# Patient Record
Sex: Female | Born: 1955
Health system: Southern US, Community
[De-identification: ages and names within clinical notes are randomized; demographics above are authoritative.]

## PROBLEM LIST (undated history)

## (undated) DIAGNOSIS — Z8719 Personal history of other diseases of the digestive system: Secondary | ICD-10-CM

## (undated) DIAGNOSIS — F329 Major depressive disorder, single episode, unspecified: Secondary | ICD-10-CM

## (undated) DIAGNOSIS — G4730 Sleep apnea, unspecified: Secondary | ICD-10-CM

## (undated) DIAGNOSIS — R053 Chronic cough: Secondary | ICD-10-CM

## (undated) DIAGNOSIS — E876 Hypokalemia: Secondary | ICD-10-CM

## (undated) DIAGNOSIS — J9690 Respiratory failure, unspecified, unspecified whether with hypoxia or hypercapnia: Secondary | ICD-10-CM

## (undated) DIAGNOSIS — N189 Chronic kidney disease, unspecified: Secondary | ICD-10-CM

## (undated) DIAGNOSIS — T4145XA Adverse effect of unspecified anesthetic, initial encounter: Secondary | ICD-10-CM

## (undated) DIAGNOSIS — K219 Gastro-esophageal reflux disease without esophagitis: Secondary | ICD-10-CM

## (undated) DIAGNOSIS — F32A Depression, unspecified: Secondary | ICD-10-CM

## (undated) DIAGNOSIS — T8859XA Other complications of anesthesia, initial encounter: Secondary | ICD-10-CM

## (undated) DIAGNOSIS — D649 Anemia, unspecified: Secondary | ICD-10-CM

## (undated) DIAGNOSIS — Z9889 Other specified postprocedural states: Secondary | ICD-10-CM

## (undated) DIAGNOSIS — R7303 Prediabetes: Secondary | ICD-10-CM

## (undated) DIAGNOSIS — R05 Cough: Secondary | ICD-10-CM

## (undated) DIAGNOSIS — J189 Pneumonia, unspecified organism: Secondary | ICD-10-CM

## (undated) DIAGNOSIS — F419 Anxiety disorder, unspecified: Secondary | ICD-10-CM

## (undated) DIAGNOSIS — R739 Hyperglycemia, unspecified: Secondary | ICD-10-CM

## (undated) DIAGNOSIS — I10 Essential (primary) hypertension: Secondary | ICD-10-CM

## (undated) DIAGNOSIS — J45909 Unspecified asthma, uncomplicated: Secondary | ICD-10-CM

## (undated) HISTORY — DX: Major depressive disorder, single episode, unspecified: F32.9

## (undated) HISTORY — DX: Hyperglycemia, unspecified: R73.9

## (undated) HISTORY — PX: TUBAL LIGATION: SHX77

## (undated) HISTORY — DX: Cough: R05

## (undated) HISTORY — DX: Anxiety disorder, unspecified: F41.9

## (undated) HISTORY — DX: Chronic cough: R05.3

## (undated) HISTORY — DX: Other specified postprocedural states: Z98.890

## (undated) HISTORY — DX: Personal history of other diseases of the digestive system: Z87.19

## (undated) HISTORY — DX: Depression, unspecified: F32.A

## (undated) HISTORY — DX: Gastro-esophageal reflux disease without esophagitis: K21.9

## (undated) HISTORY — PX: ERCP: SHX60

## (undated) HISTORY — DX: Sleep apnea, unspecified: G47.30

## (undated) HISTORY — DX: Respiratory failure, unspecified, unspecified whether with hypoxia or hypercapnia: J96.90

## (undated) HISTORY — PX: APPENDECTOMY: SHX54

## (undated) HISTORY — DX: Essential (primary) hypertension: I10

## (undated) HISTORY — DX: Chronic kidney disease, unspecified: N18.9

## (undated) HISTORY — DX: Hypokalemia: E87.6

## (undated) HISTORY — PX: CHOLECYSTECTOMY: SHX55

## (undated) HISTORY — PX: HAMMER TOE SURGERY: SHX385

## (undated) HISTORY — PX: BREAST REDUCTION SURGERY: SHX8

## (undated) HISTORY — PX: ABDOMINAL HYSTERECTOMY: SHX81

---

## 1997-01-06 HISTORY — PX: INTESTINAL MALROTATION REPAIR: SHX411

## 1997-12-29 ENCOUNTER — Inpatient Hospital Stay (HOSPITAL_COMMUNITY): Admission: EM | Admit: 1997-12-29 | Discharge: 1998-01-10 | Payer: Self-pay | Admitting: Emergency Medicine

## 1998-01-03 ENCOUNTER — Encounter (HOSPITAL_BASED_OUTPATIENT_CLINIC_OR_DEPARTMENT_OTHER): Payer: Self-pay | Admitting: General Surgery

## 1998-01-05 ENCOUNTER — Encounter: Payer: Self-pay | Admitting: Internal Medicine

## 1998-01-08 ENCOUNTER — Encounter (HOSPITAL_BASED_OUTPATIENT_CLINIC_OR_DEPARTMENT_OTHER): Payer: Self-pay | Admitting: General Surgery

## 1998-01-09 ENCOUNTER — Encounter: Payer: Self-pay | Admitting: Internal Medicine

## 1998-04-05 ENCOUNTER — Encounter: Admission: RE | Admit: 1998-04-05 | Discharge: 1998-07-04 | Payer: Self-pay | Admitting: Family Medicine

## 1998-06-12 ENCOUNTER — Encounter: Payer: Self-pay | Admitting: Obstetrics and Gynecology

## 1998-06-12 ENCOUNTER — Ambulatory Visit (HOSPITAL_COMMUNITY): Admission: RE | Admit: 1998-06-12 | Discharge: 1998-06-12 | Payer: Self-pay | Admitting: Obstetrics and Gynecology

## 1998-12-25 ENCOUNTER — Other Ambulatory Visit: Admission: RE | Admit: 1998-12-25 | Discharge: 1998-12-25 | Payer: Self-pay | Admitting: Obstetrics and Gynecology

## 2000-03-31 ENCOUNTER — Other Ambulatory Visit: Admission: RE | Admit: 2000-03-31 | Discharge: 2000-03-31 | Payer: Self-pay | Admitting: Obstetrics and Gynecology

## 2000-05-19 ENCOUNTER — Ambulatory Visit (HOSPITAL_COMMUNITY): Admission: RE | Admit: 2000-05-19 | Discharge: 2000-05-19 | Payer: Self-pay | Admitting: Obstetrics and Gynecology

## 2000-05-19 ENCOUNTER — Encounter: Payer: Self-pay | Admitting: Obstetrics and Gynecology

## 2001-12-20 ENCOUNTER — Ambulatory Visit (HOSPITAL_COMMUNITY): Admission: RE | Admit: 2001-12-20 | Discharge: 2001-12-20 | Payer: Self-pay | Admitting: Family Medicine

## 2003-03-24 ENCOUNTER — Emergency Department (HOSPITAL_COMMUNITY): Admission: EM | Admit: 2003-03-24 | Discharge: 2003-03-24 | Payer: Self-pay | Admitting: Emergency Medicine

## 2004-06-02 ENCOUNTER — Emergency Department (HOSPITAL_COMMUNITY): Admission: EM | Admit: 2004-06-02 | Discharge: 2004-06-03 | Payer: Self-pay | Admitting: Emergency Medicine

## 2004-06-07 ENCOUNTER — Ambulatory Visit (HOSPITAL_COMMUNITY): Admission: RE | Admit: 2004-06-07 | Discharge: 2004-06-07 | Payer: Self-pay | Admitting: Obstetrics and Gynecology

## 2004-07-18 ENCOUNTER — Ambulatory Visit: Payer: Self-pay | Admitting: Gastroenterology

## 2004-07-25 ENCOUNTER — Ambulatory Visit (HOSPITAL_COMMUNITY): Admission: RE | Admit: 2004-07-25 | Discharge: 2004-07-25 | Payer: Self-pay | Admitting: Surgery

## 2004-07-26 ENCOUNTER — Ambulatory Visit (HOSPITAL_COMMUNITY): Admission: RE | Admit: 2004-07-26 | Discharge: 2004-07-26 | Payer: Self-pay | Admitting: Surgery

## 2004-07-30 ENCOUNTER — Encounter: Admission: RE | Admit: 2004-07-30 | Discharge: 2004-08-08 | Payer: Self-pay | Admitting: Surgery

## 2004-08-03 ENCOUNTER — Ambulatory Visit (HOSPITAL_COMMUNITY): Admission: RE | Admit: 2004-08-03 | Discharge: 2004-08-03 | Payer: Self-pay | Admitting: Gastroenterology

## 2004-08-05 ENCOUNTER — Encounter: Admission: RE | Admit: 2004-08-05 | Discharge: 2004-11-03 | Payer: Self-pay | Admitting: Surgery

## 2004-08-05 ENCOUNTER — Ambulatory Visit (HOSPITAL_BASED_OUTPATIENT_CLINIC_OR_DEPARTMENT_OTHER): Admission: RE | Admit: 2004-08-05 | Discharge: 2004-08-05 | Payer: Self-pay | Admitting: Surgery

## 2004-08-06 ENCOUNTER — Ambulatory Visit: Payer: Self-pay | Admitting: Gastroenterology

## 2004-08-08 ENCOUNTER — Ambulatory Visit: Payer: Self-pay | Admitting: Gastroenterology

## 2004-08-09 ENCOUNTER — Inpatient Hospital Stay (HOSPITAL_COMMUNITY): Admission: AD | Admit: 2004-08-09 | Discharge: 2004-09-17 | Payer: Self-pay | Admitting: Gastroenterology

## 2004-08-09 ENCOUNTER — Ambulatory Visit: Payer: Self-pay | Admitting: Physical Medicine & Rehabilitation

## 2004-08-12 ENCOUNTER — Ambulatory Visit: Payer: Self-pay | Admitting: Internal Medicine

## 2004-08-15 ENCOUNTER — Encounter (INDEPENDENT_AMBULATORY_CARE_PROVIDER_SITE_OTHER): Payer: Self-pay | Admitting: *Deleted

## 2004-09-10 ENCOUNTER — Ambulatory Visit: Payer: Self-pay | Admitting: Pulmonary Disease

## 2004-09-13 ENCOUNTER — Ambulatory Visit: Payer: Self-pay | Admitting: Internal Medicine

## 2004-09-27 ENCOUNTER — Ambulatory Visit: Payer: Self-pay | Admitting: Internal Medicine

## 2004-10-02 ENCOUNTER — Encounter: Admission: RE | Admit: 2004-10-02 | Discharge: 2004-10-02 | Payer: Self-pay | Admitting: Family Medicine

## 2004-10-03 ENCOUNTER — Ambulatory Visit: Payer: Self-pay | Admitting: Internal Medicine

## 2004-10-04 ENCOUNTER — Ambulatory Visit: Payer: Self-pay | Admitting: Gastroenterology

## 2004-10-09 ENCOUNTER — Ambulatory Visit: Payer: Self-pay | Admitting: Gastroenterology

## 2004-10-09 LAB — HM COLONOSCOPY: HM Colonoscopy: NORMAL

## 2004-10-15 ENCOUNTER — Ambulatory Visit: Payer: Self-pay | Admitting: Gastroenterology

## 2004-10-15 ENCOUNTER — Inpatient Hospital Stay (HOSPITAL_COMMUNITY): Admission: EM | Admit: 2004-10-15 | Discharge: 2004-10-18 | Payer: Self-pay | Admitting: Emergency Medicine

## 2004-11-12 ENCOUNTER — Ambulatory Visit: Payer: Self-pay | Admitting: Gastroenterology

## 2004-12-02 ENCOUNTER — Ambulatory Visit: Payer: Self-pay | Admitting: Pulmonary Disease

## 2004-12-16 ENCOUNTER — Ambulatory Visit: Payer: Self-pay | Admitting: Internal Medicine

## 2005-01-16 ENCOUNTER — Ambulatory Visit: Payer: Self-pay | Admitting: Internal Medicine

## 2005-03-14 ENCOUNTER — Ambulatory Visit: Payer: Self-pay | Admitting: Internal Medicine

## 2005-04-15 ENCOUNTER — Ambulatory Visit: Payer: Self-pay | Admitting: Internal Medicine

## 2005-05-30 ENCOUNTER — Ambulatory Visit: Payer: Self-pay | Admitting: Internal Medicine

## 2005-07-31 ENCOUNTER — Ambulatory Visit: Payer: Self-pay | Admitting: Gastroenterology

## 2005-12-10 ENCOUNTER — Ambulatory Visit: Payer: Self-pay | Admitting: Internal Medicine

## 2006-05-21 ENCOUNTER — Emergency Department (HOSPITAL_COMMUNITY): Admission: EM | Admit: 2006-05-21 | Discharge: 2006-05-21 | Payer: Self-pay | Admitting: Emergency Medicine

## 2006-09-14 ENCOUNTER — Ambulatory Visit: Payer: Self-pay | Admitting: Gastroenterology

## 2006-09-14 ENCOUNTER — Ambulatory Visit (HOSPITAL_COMMUNITY): Admission: RE | Admit: 2006-09-14 | Discharge: 2006-09-14 | Payer: Self-pay | Admitting: Obstetrics and Gynecology

## 2006-09-15 ENCOUNTER — Ambulatory Visit: Payer: Self-pay | Admitting: Gastroenterology

## 2006-09-15 LAB — CONVERTED CEMR LAB
ALT: 26 units/L (ref 0–35)
AST: 29 units/L (ref 0–37)
Albumin: 3.5 g/dL (ref 3.5–5.2)
Alkaline Phosphatase: 98 units/L (ref 39–117)
BUN: 11 mg/dL (ref 6–23)
Basophils Absolute: 0 10*3/uL (ref 0.0–0.1)
Basophils Relative: 0.1 % (ref 0.0–1.0)
Bilirubin, Direct: 0.1 mg/dL (ref 0.0–0.3)
CO2: 30 meq/L (ref 19–32)
Calcium: 8.7 mg/dL (ref 8.4–10.5)
Chloride: 106 meq/L (ref 96–112)
Creatinine, Ser: 0.8 mg/dL (ref 0.4–1.2)
Eosinophils Absolute: 0.2 10*3/uL (ref 0.0–0.6)
Eosinophils Relative: 2 % (ref 0.0–5.0)
GFR calc Af Amer: 98 mL/min
GFR calc non Af Amer: 81 mL/min
Glucose, Bld: 101 mg/dL — ABNORMAL HIGH (ref 70–99)
HCT: 35.3 % — ABNORMAL LOW (ref 36.0–46.0)
Hemoglobin: 12.1 g/dL (ref 12.0–15.0)
Lipase: 19 units/L (ref 11.0–59.0)
Lymphocytes Relative: 28.4 % (ref 12.0–46.0)
MCHC: 34.4 g/dL (ref 30.0–36.0)
MCV: 87.1 fL (ref 78.0–100.0)
Monocytes Absolute: 0.5 10*3/uL (ref 0.2–0.7)
Monocytes Relative: 4.6 % (ref 3.0–11.0)
Neutro Abs: 6.9 10*3/uL (ref 1.4–7.7)
Neutrophils Relative %: 64.9 % (ref 43.0–77.0)
Platelets: 275 10*3/uL (ref 150–400)
Potassium: 3.9 meq/L (ref 3.5–5.1)
RBC: 4.06 M/uL (ref 3.87–5.11)
RDW: 13.9 % (ref 11.5–14.6)
Sodium: 141 meq/L (ref 135–145)
Total Bilirubin: 0.4 mg/dL (ref 0.3–1.2)
Total Protein: 6.9 g/dL (ref 6.0–8.3)
WBC: 10.6 10*3/uL — ABNORMAL HIGH (ref 4.5–10.5)

## 2006-09-19 IMAGING — CR DG ABDOMEN ACUTE W/ 1V CHEST
3 series · 3 of 3 positions shown · non-contrast
Comparison: 06/02/04.

CLINICAL DATA: Gallstones.  Evaluate for ileus.  
 ACUTE ABDOMEN WITH CHEST ? 3 VIEWS:

[view not recorded (1 of 3)]
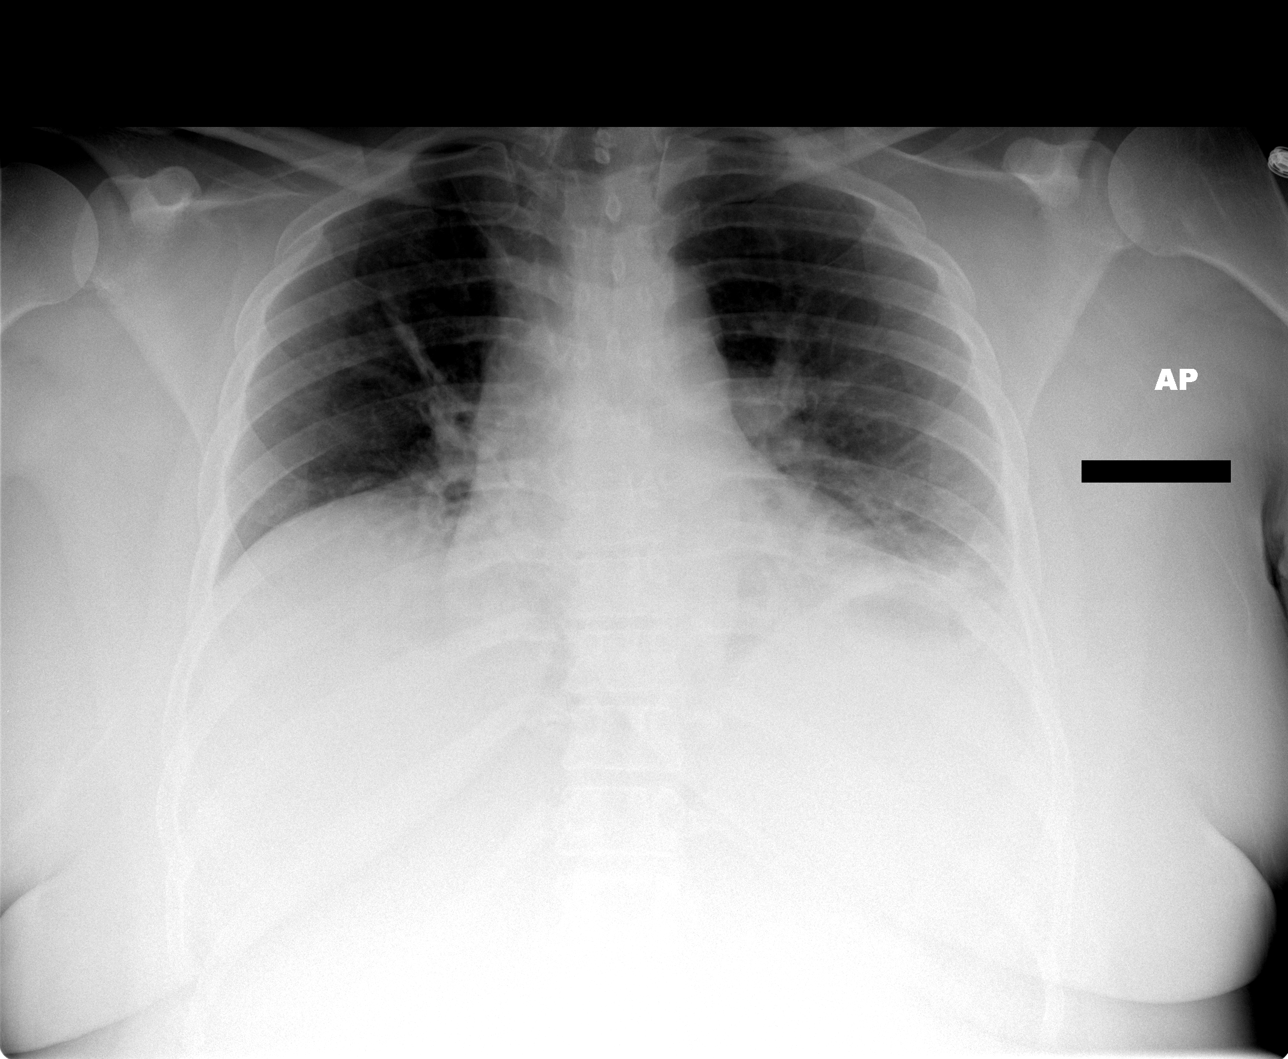

[view not recorded (2 of 3)]
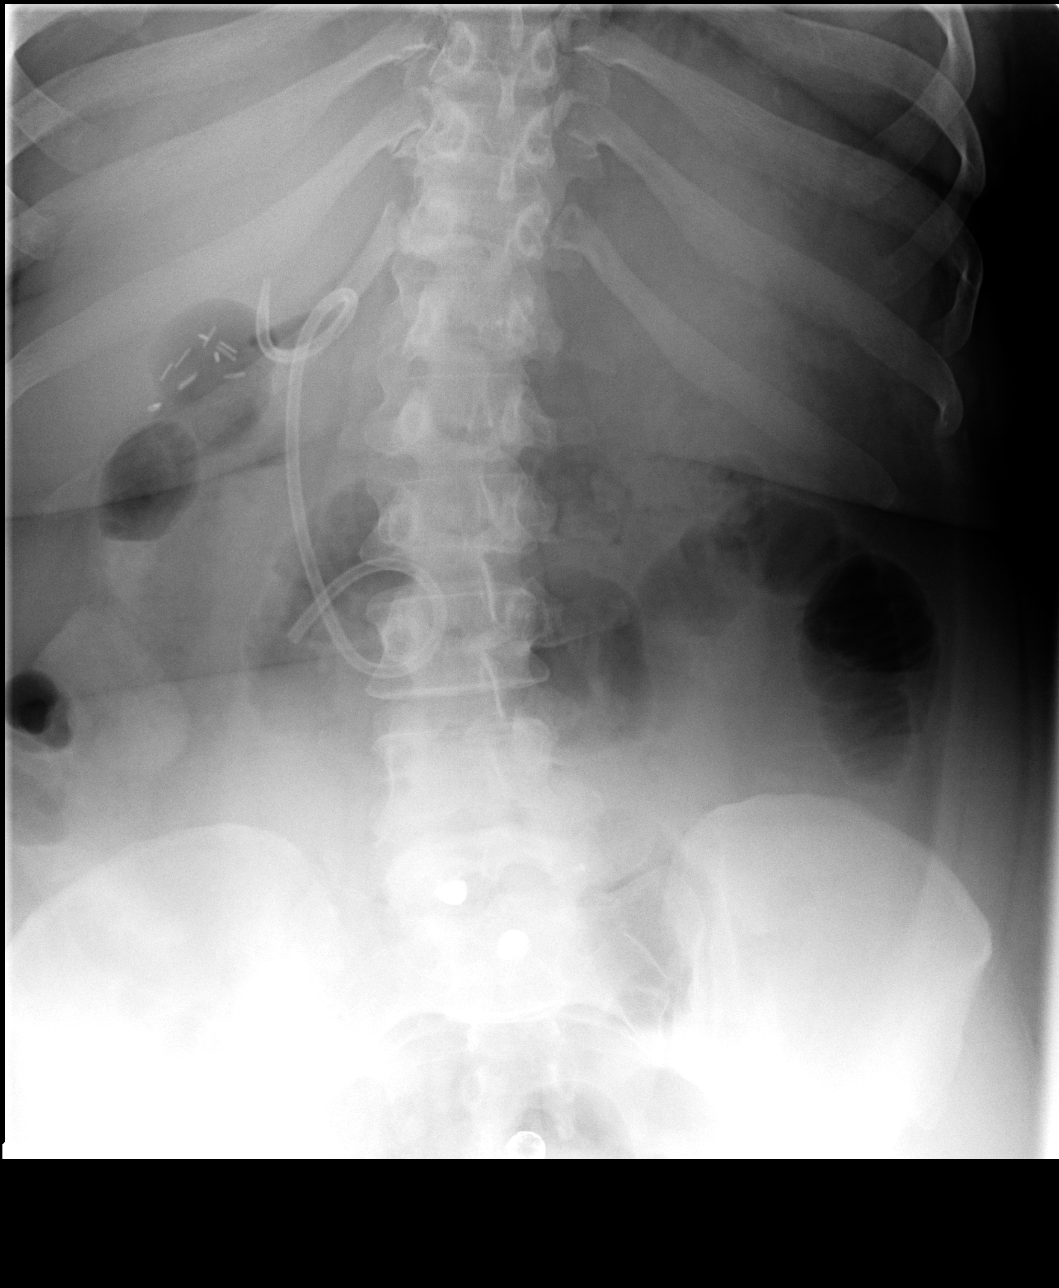

[view not recorded (3 of 3)]
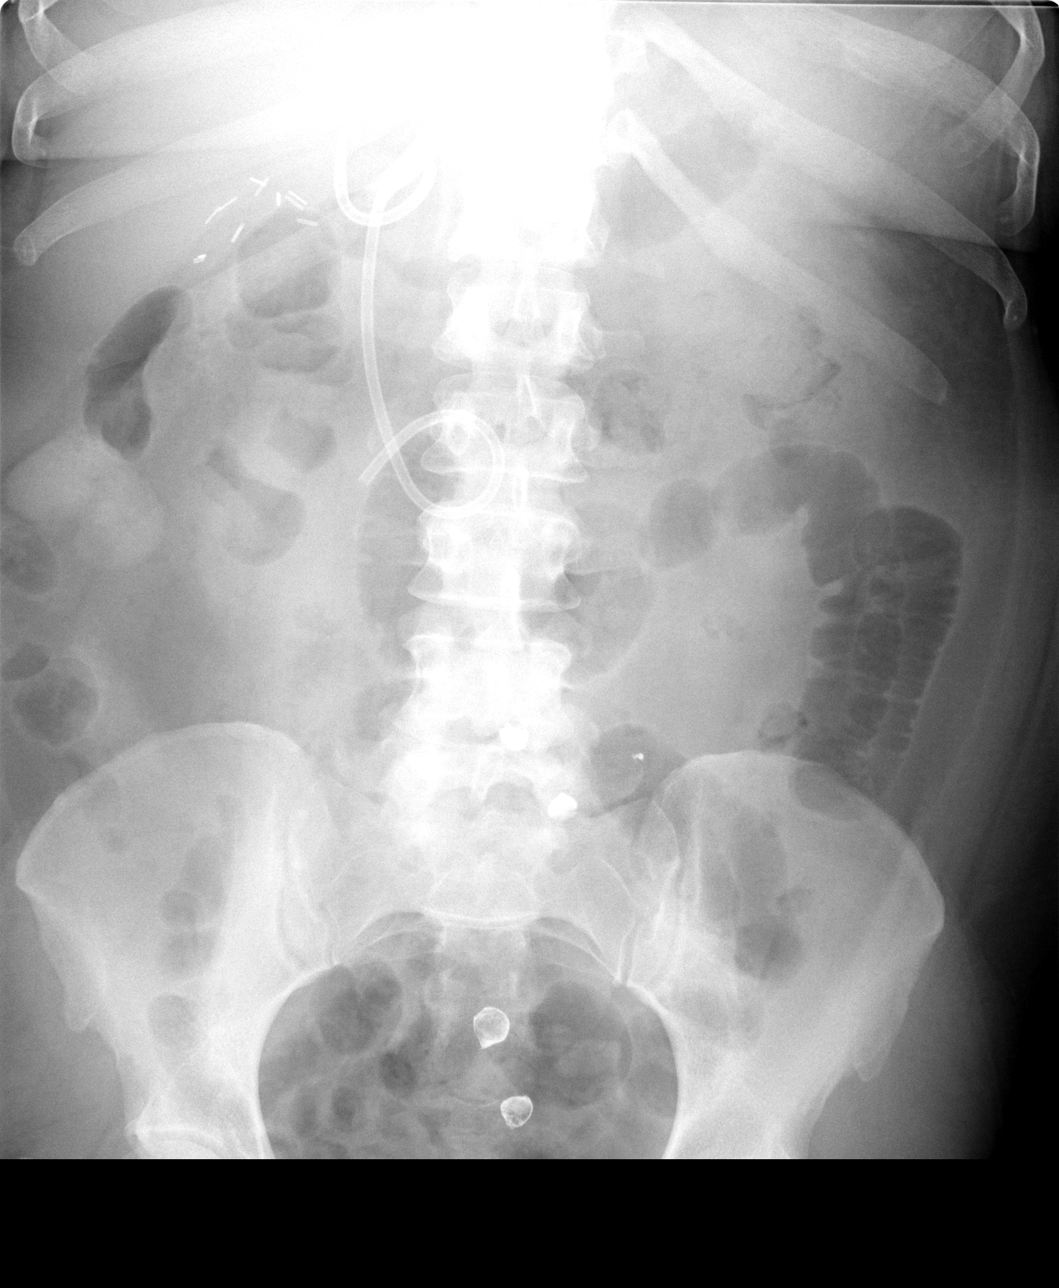

[3 of 3 positions shown; findings below may reference images not displayed]

FINDINGS: The lungs are suboptimally inflated.  There is lower lobe atelectasis as well as right mid lung linear opacity likely representing atelectasis.  No pulmonary edema.  
 Within the abdomen, there are several dilated loops of air-filled small bowel.  Gas and stool is also seen within the colon.  Findings are most consistent with an adynamic ileus.   There has been interval placement of a common bile duct stent.
IMPRESSION: 1.  Probable adynamic ileus. 
 2.  Bilateral predominantly lower lobe atelectasis.

## 2006-10-03 IMAGING — CR DG CHEST 1V PORT
1 series · 1 of 1 positions shown · non-contrast
Comparison: 08/21/2004

CLINICAL DATA: Gallstones. Ventilator dependence.

[view not recorded]
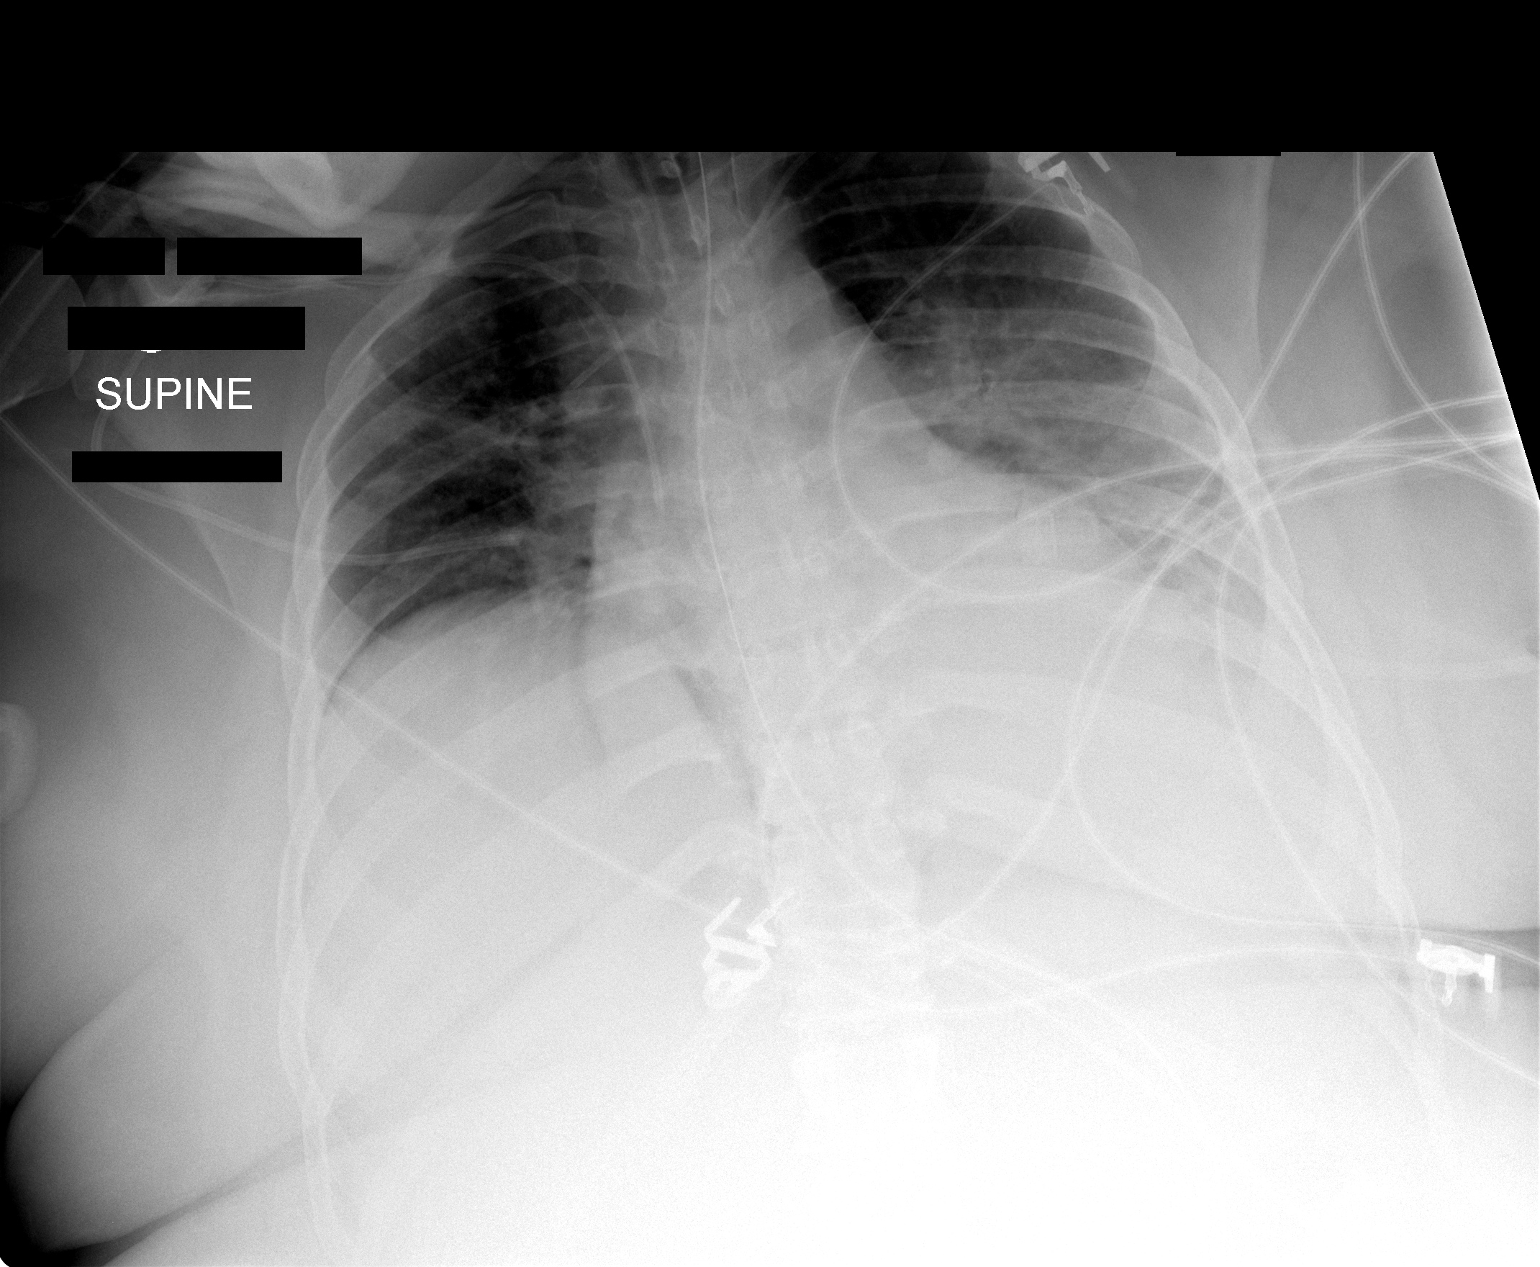

[1 of 1 positions shown; findings below may reference images not displayed]

PORTABLE CHEST - 1 VIEW:

AP film at 2082 hours shows low volumes with cardiomegaly. Pulmonary edema
pattern is unchanged. Basilar atelectasis persists. Endotracheal tube, NG tube,
right subclavian central line, and left PICC line remain in place.
IMPRESSION: Stable exam.

## 2006-10-04 IMAGING — CR DG CHEST 1V PORT
1 series · 1 of 1 positions shown · non-contrast
Comparison: 08/23/04.

CLINICAL DATA: Respiratory failure.  
 PORTABLE CHEST - 1 VIEW 08/24/04 AT 2929 HOURS:

[view not recorded]
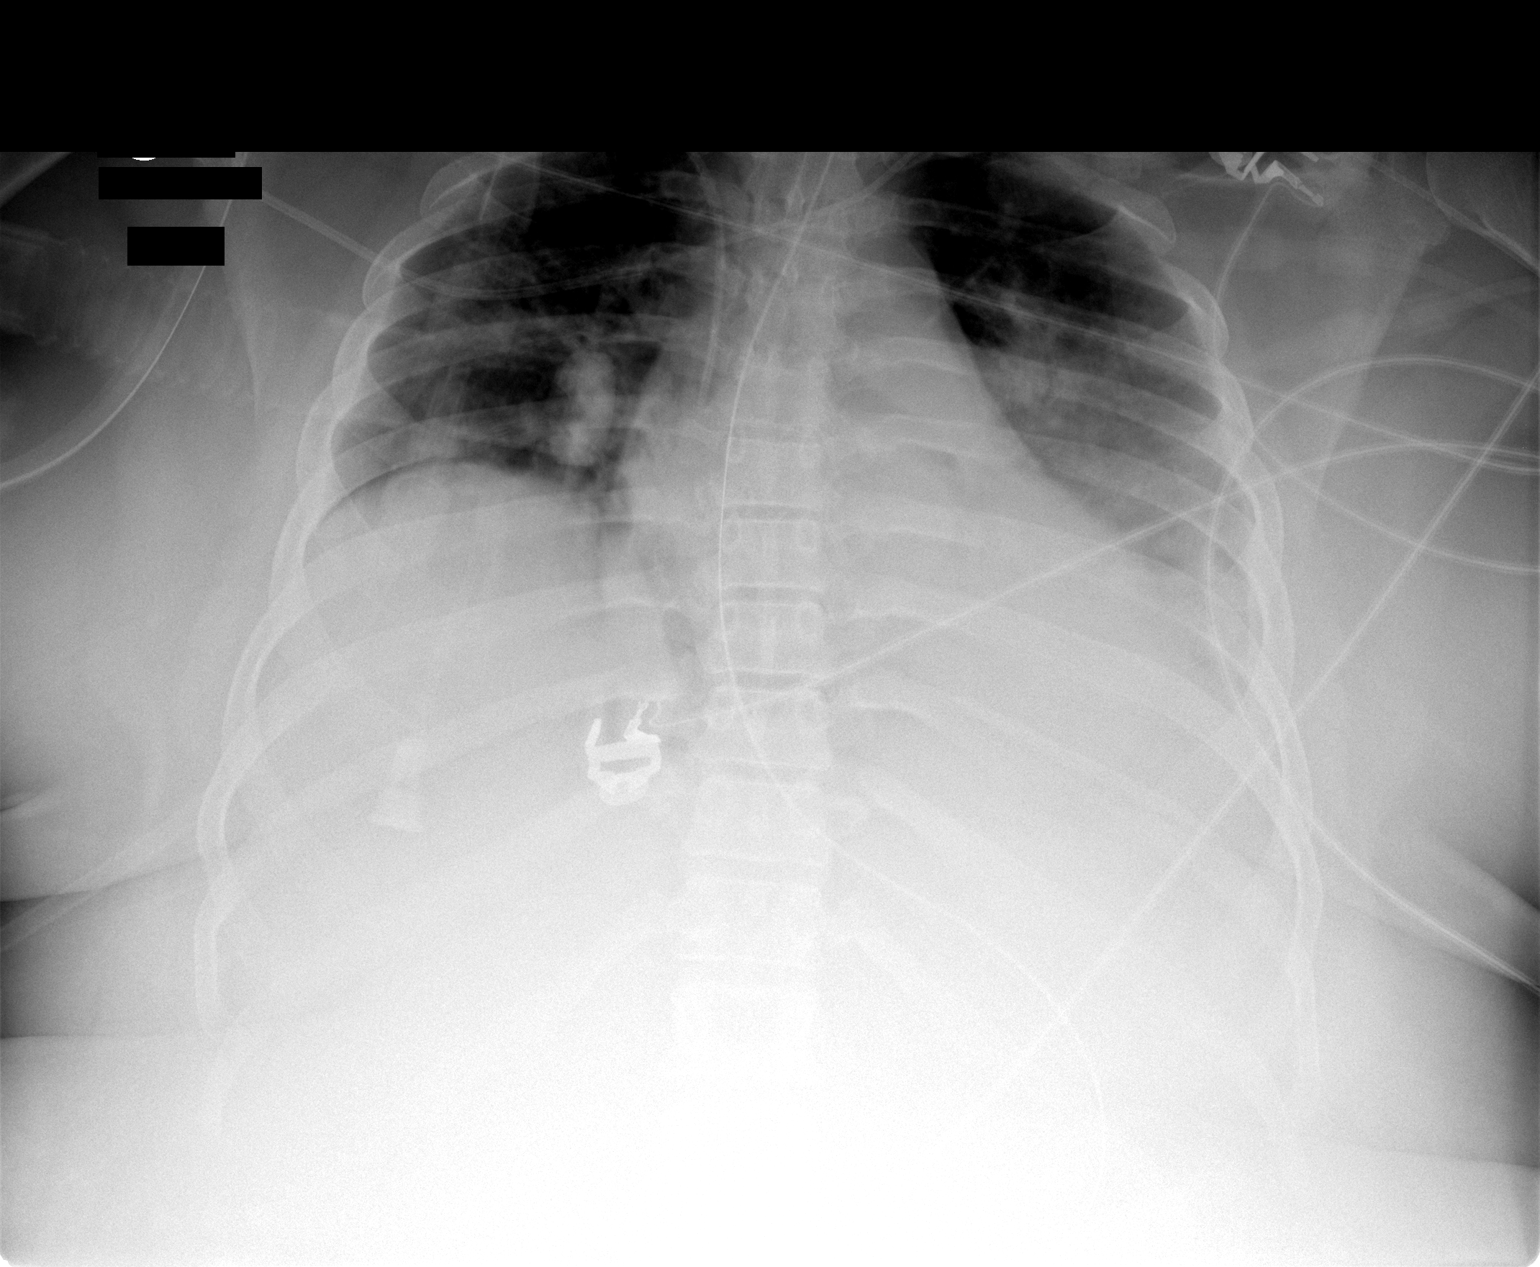

[1 of 1 positions shown; findings below may reference images not displayed]

FINDINGS: There are low bilateral lung volumes. Stable positioning of endotracheal tube.  No overt edema or pleural effusions.
IMPRESSION: Low lung volumes.

## 2006-12-24 DIAGNOSIS — K219 Gastro-esophageal reflux disease without esophagitis: Secondary | ICD-10-CM | POA: Insufficient documentation

## 2006-12-29 ENCOUNTER — Encounter: Payer: Self-pay | Admitting: Internal Medicine

## 2006-12-29 DIAGNOSIS — R739 Hyperglycemia, unspecified: Secondary | ICD-10-CM | POA: Insufficient documentation

## 2006-12-29 DIAGNOSIS — Z8719 Personal history of other diseases of the digestive system: Secondary | ICD-10-CM | POA: Insufficient documentation

## 2007-04-29 ENCOUNTER — Ambulatory Visit: Payer: Self-pay | Admitting: Vascular Surgery

## 2007-04-29 ENCOUNTER — Ambulatory Visit: Admission: RE | Admit: 2007-04-29 | Discharge: 2007-04-29 | Payer: Self-pay | Admitting: Emergency Medicine

## 2007-04-29 ENCOUNTER — Encounter: Payer: Self-pay | Admitting: Emergency Medicine

## 2007-10-21 ENCOUNTER — Encounter: Admission: RE | Admit: 2007-10-21 | Discharge: 2007-10-21 | Payer: Self-pay | Admitting: Obstetrics and Gynecology

## 2007-11-13 ENCOUNTER — Ambulatory Visit: Payer: Self-pay | Admitting: Cardiology

## 2007-11-14 ENCOUNTER — Observation Stay (HOSPITAL_COMMUNITY): Admission: EM | Admit: 2007-11-14 | Discharge: 2007-11-14 | Payer: Self-pay | Admitting: Emergency Medicine

## 2008-06-01 ENCOUNTER — Ambulatory Visit: Payer: Self-pay | Admitting: Internal Medicine

## 2008-06-01 DIAGNOSIS — E876 Hypokalemia: Secondary | ICD-10-CM | POA: Insufficient documentation

## 2008-06-01 DIAGNOSIS — I10 Essential (primary) hypertension: Secondary | ICD-10-CM | POA: Insufficient documentation

## 2008-06-01 LAB — CONVERTED CEMR LAB
ALT: 23 units/L (ref 0–35)
AST: 21 units/L (ref 0–37)
Albumin: 3.8 g/dL (ref 3.5–5.2)
Alkaline Phosphatase: 90 units/L (ref 39–117)
BUN: 12 mg/dL (ref 6–23)
Basophils Absolute: 0 10*3/uL (ref 0.0–0.1)
Basophils Relative: 0.2 % (ref 0.0–3.0)
Bilirubin Urine: NEGATIVE
Bilirubin, Direct: 0.1 mg/dL (ref 0.0–0.3)
CO2: 34 meq/L — ABNORMAL HIGH (ref 19–32)
Calcium: 9 mg/dL (ref 8.4–10.5)
Chloride: 100 meq/L (ref 96–112)
Cholesterol: 165 mg/dL (ref 0–200)
Creatinine, Ser: 0.7 mg/dL (ref 0.4–1.2)
Eosinophils Absolute: 0.1 10*3/uL (ref 0.0–0.7)
Eosinophils Relative: 1.6 % (ref 0.0–5.0)
GFR calc non Af Amer: 112.7 mL/min (ref >60)
Glucose, Bld: 96 mg/dL (ref 70–99)
HCT: 37.1 % (ref 36.0–46.0)
HDL: 46.6 mg/dL (ref >39.00)
Hemoglobin, Urine: NEGATIVE
Hemoglobin: 12.6 g/dL (ref 12.0–15.0)
Hgb A1c MFr Bld: 5.9 % (ref 4.6–6.5)
Iron: 53 ug/dL (ref 42–145)
Ketones, ur: NEGATIVE mg/dL
LDL Cholesterol: 104 mg/dL — ABNORMAL HIGH (ref 0–99)
Leukocytes, UA: NEGATIVE
Lymphocytes Relative: 23.5 % (ref 12.0–46.0)
Lymphs Abs: 2 10*3/uL (ref 0.7–4.0)
MCHC: 34.1 g/dL (ref 30.0–36.0)
MCV: 87.4 fL (ref 78.0–100.0)
Magnesium: 1.8 mg/dL (ref 1.5–2.5)
Monocytes Absolute: 0.2 10*3/uL (ref 0.1–1.0)
Monocytes Relative: 2.9 % — ABNORMAL LOW (ref 3.0–12.0)
Neutro Abs: 6.3 10*3/uL (ref 1.4–7.7)
Neutrophils Relative %: 71.8 % (ref 43.0–77.0)
Nitrite: NEGATIVE
Platelets: 261 10*3/uL (ref 150.0–400.0)
Potassium: 3.1 meq/L — ABNORMAL LOW (ref 3.5–5.1)
RBC: 4.25 M/uL (ref 3.87–5.11)
RDW: 13.9 % (ref 11.5–14.6)
Saturation Ratios: 13.2 % — ABNORMAL LOW (ref 20.0–50.0)
Sodium: 141 meq/L (ref 135–145)
Specific Gravity, Urine: 1.02 (ref 1.000–1.030)
TSH: 1.31 microintl units/mL (ref 0.35–5.50)
Total Bilirubin: 0.7 mg/dL (ref 0.3–1.2)
Total CHOL/HDL Ratio: 4
Total Protein, Urine: NEGATIVE mg/dL
Total Protein: 7.5 g/dL (ref 6.0–8.3)
Transferrin: 287.5 mg/dL (ref 212.0–360.0)
Triglycerides: 74 mg/dL (ref 0.0–149.0)
Uric Acid, Serum: 6.5 mg/dL (ref 2.4–7.0)
Urine Glucose: NEGATIVE mg/dL
Urobilinogen, UA: 0.2 (ref 0.0–1.0)
VLDL: 14.8 mg/dL (ref 0.0–40.0)
Vit D, 25-Hydroxy: 7 ng/mL — ABNORMAL LOW (ref 30–89)
WBC: 8.6 10*3/uL (ref 4.5–10.5)
pH: 6.5 (ref 5.0–8.0)

## 2008-06-02 ENCOUNTER — Encounter: Payer: Self-pay | Admitting: Internal Medicine

## 2008-06-15 ENCOUNTER — Ambulatory Visit: Payer: Self-pay | Admitting: Internal Medicine

## 2008-06-15 DIAGNOSIS — E559 Vitamin D deficiency, unspecified: Secondary | ICD-10-CM | POA: Insufficient documentation

## 2008-06-15 LAB — CONVERTED CEMR LAB
BUN: 11 mg/dL (ref 6–23)
CO2: 32 meq/L (ref 19–32)
Calcium: 8.7 mg/dL (ref 8.4–10.5)
Chloride: 106 meq/L (ref 96–112)
Creatinine, Ser: 0.7 mg/dL (ref 0.4–1.2)
GFR calc non Af Amer: 112.68 mL/min (ref >60)
Glucose, Bld: 87 mg/dL (ref 70–99)
Magnesium: 2.3 mg/dL (ref 1.5–2.5)
Potassium: 3.5 meq/L (ref 3.5–5.1)
Sodium: 142 meq/L (ref 135–145)

## 2008-06-16 ENCOUNTER — Telehealth: Payer: Self-pay | Admitting: Internal Medicine

## 2008-06-21 ENCOUNTER — Telehealth: Payer: Self-pay | Admitting: Internal Medicine

## 2008-06-22 ENCOUNTER — Ambulatory Visit: Payer: Self-pay | Admitting: Internal Medicine

## 2008-07-13 ENCOUNTER — Ambulatory Visit: Payer: Self-pay | Admitting: Internal Medicine

## 2008-07-13 DIAGNOSIS — F418 Other specified anxiety disorders: Secondary | ICD-10-CM | POA: Insufficient documentation

## 2008-07-13 LAB — CONVERTED CEMR LAB
BUN: 11 mg/dL (ref 6–23)
CO2: 31 meq/L (ref 19–32)
Calcium: 9.1 mg/dL (ref 8.4–10.5)
Chloride: 101 meq/L (ref 96–112)
Creatinine, Ser: 0.7 mg/dL (ref 0.4–1.2)
GFR calc non Af Amer: 112.65 mL/min (ref >60)
Glucose, Bld: 101 mg/dL — ABNORMAL HIGH (ref 70–99)
Potassium: 3.5 meq/L (ref 3.5–5.1)
Sodium: 141 meq/L (ref 135–145)

## 2008-08-18 ENCOUNTER — Ambulatory Visit: Payer: Self-pay | Admitting: Internal Medicine

## 2008-08-31 ENCOUNTER — Telehealth: Payer: Self-pay | Admitting: Internal Medicine

## 2008-09-04 ENCOUNTER — Telehealth: Payer: Self-pay | Admitting: Internal Medicine

## 2008-09-30 ENCOUNTER — Ambulatory Visit: Payer: Self-pay | Admitting: Family Medicine

## 2008-09-30 DIAGNOSIS — J309 Allergic rhinitis, unspecified: Secondary | ICD-10-CM | POA: Insufficient documentation

## 2008-10-17 ENCOUNTER — Ambulatory Visit: Payer: Self-pay | Admitting: Internal Medicine

## 2008-10-17 LAB — CONVERTED CEMR LAB
BUN: 12 mg/dL (ref 6–23)
CO2: 34 meq/L — ABNORMAL HIGH (ref 19–32)
Calcium: 9.4 mg/dL (ref 8.4–10.5)
Chloride: 97 meq/L (ref 96–112)
Creatinine, Ser: 0.7 mg/dL (ref 0.4–1.2)
GFR calc non Af Amer: 112.53 mL/min (ref >60)
Glucose, Bld: 94 mg/dL (ref 70–99)
Magnesium: 1.9 mg/dL (ref 1.5–2.5)
Potassium: 3.6 meq/L (ref 3.5–5.1)
Sodium: 143 meq/L (ref 135–145)

## 2008-10-24 ENCOUNTER — Encounter: Admission: RE | Admit: 2008-10-24 | Discharge: 2008-10-24 | Payer: Self-pay | Admitting: Obstetrics and Gynecology

## 2008-10-25 ENCOUNTER — Ambulatory Visit: Payer: Self-pay | Admitting: Internal Medicine

## 2008-11-24 ENCOUNTER — Ambulatory Visit: Payer: Self-pay | Admitting: Internal Medicine

## 2008-11-24 LAB — CONVERTED CEMR LAB
BUN: 6 mg/dL (ref 6–23)
CO2: 32 meq/L (ref 19–32)
Calcium: 9 mg/dL (ref 8.4–10.5)
Chloride: 102 meq/L (ref 96–112)
Creatinine, Ser: 0.7 mg/dL (ref 0.4–1.2)
GFR calc non Af Amer: 112.49 mL/min (ref >60)
Glucose, Bld: 95 mg/dL (ref 70–99)
Potassium: 3.5 meq/L (ref 3.5–5.1)
Sodium: 142 meq/L (ref 135–145)

## 2008-12-04 ENCOUNTER — Encounter: Payer: Self-pay | Admitting: Internal Medicine

## 2008-12-04 ENCOUNTER — Telehealth: Payer: Self-pay | Admitting: Internal Medicine

## 2008-12-13 ENCOUNTER — Telehealth: Payer: Self-pay | Admitting: Internal Medicine

## 2008-12-18 ENCOUNTER — Encounter: Payer: Self-pay | Admitting: Internal Medicine

## 2009-01-03 ENCOUNTER — Encounter: Payer: Self-pay | Admitting: Internal Medicine

## 2009-01-04 ENCOUNTER — Telehealth: Payer: Self-pay | Admitting: Internal Medicine

## 2009-01-08 ENCOUNTER — Encounter: Payer: Self-pay | Admitting: Internal Medicine

## 2009-01-17 ENCOUNTER — Ambulatory Visit: Payer: Self-pay | Admitting: Internal Medicine

## 2009-02-05 ENCOUNTER — Encounter: Payer: Self-pay | Admitting: Internal Medicine

## 2009-03-20 ENCOUNTER — Ambulatory Visit: Payer: Self-pay | Admitting: Internal Medicine

## 2009-03-20 LAB — CONVERTED CEMR LAB
BUN: 13 mg/dL (ref 6–23)
Basophils Absolute: 0.1 10*3/uL (ref 0.0–0.1)
Basophils Relative: 0.6 % (ref 0.0–3.0)
CO2: 31 meq/L (ref 19–32)
Calcium: 9.2 mg/dL (ref 8.4–10.5)
Chloride: 102 meq/L (ref 96–112)
Creatinine, Ser: 0.6 mg/dL (ref 0.4–1.2)
Eosinophils Absolute: 0.1 10*3/uL (ref 0.0–0.7)
Eosinophils Relative: 0.7 % (ref 0.0–5.0)
GFR calc non Af Amer: 134.23 mL/min (ref >60)
Glucose, Bld: 85 mg/dL (ref 70–99)
HCT: 40.4 % (ref 36.0–46.0)
Hemoglobin: 13.3 g/dL (ref 12.0–15.0)
Hgb A1c MFr Bld: 5.6 % (ref 4.6–6.5)
Lymphocytes Relative: 23.2 % (ref 12.0–46.0)
Lymphs Abs: 2.6 10*3/uL (ref 0.7–4.0)
MCHC: 33 g/dL (ref 30.0–36.0)
MCV: 90.2 fL (ref 78.0–100.0)
Monocytes Absolute: 0.5 10*3/uL (ref 0.1–1.0)
Monocytes Relative: 4.6 % (ref 3.0–12.0)
Neutro Abs: 8.1 10*3/uL — ABNORMAL HIGH (ref 1.4–7.7)
Neutrophils Relative %: 70.9 % (ref 43.0–77.0)
Platelets: 273 10*3/uL (ref 150.0–400.0)
Potassium: 3.6 meq/L (ref 3.5–5.1)
RBC: 4.48 M/uL (ref 3.87–5.11)
RDW: 13.7 % (ref 11.5–14.6)
Sodium: 140 meq/L (ref 135–145)
Vit D, 25-Hydroxy: 26 ng/mL — ABNORMAL LOW (ref 30–89)
WBC: 11.4 10*3/uL — ABNORMAL HIGH (ref 4.5–10.5)

## 2009-03-21 ENCOUNTER — Encounter: Payer: Self-pay | Admitting: Internal Medicine

## 2009-05-01 ENCOUNTER — Encounter: Payer: Self-pay | Admitting: Internal Medicine

## 2009-05-25 ENCOUNTER — Ambulatory Visit: Payer: Self-pay | Admitting: Internal Medicine

## 2009-05-29 ENCOUNTER — Encounter: Payer: Self-pay | Admitting: Internal Medicine

## 2009-06-09 ENCOUNTER — Ambulatory Visit: Payer: Self-pay | Admitting: Family Medicine

## 2009-06-11 ENCOUNTER — Ambulatory Visit: Payer: Self-pay | Admitting: Internal Medicine

## 2009-06-15 ENCOUNTER — Telehealth (INDEPENDENT_AMBULATORY_CARE_PROVIDER_SITE_OTHER): Payer: Self-pay | Admitting: *Deleted

## 2009-07-16 ENCOUNTER — Encounter: Payer: Self-pay | Admitting: Internal Medicine

## 2009-07-23 ENCOUNTER — Ambulatory Visit: Payer: Self-pay | Admitting: Internal Medicine

## 2009-10-18 ENCOUNTER — Ambulatory Visit: Payer: Self-pay | Admitting: Internal Medicine

## 2009-10-18 LAB — CONVERTED CEMR LAB
ALT: 23 units/L (ref 0–35)
AST: 23 units/L (ref 0–37)
Albumin: 3.9 g/dL (ref 3.5–5.2)
Alkaline Phosphatase: 88 units/L (ref 39–117)
BUN: 10 mg/dL (ref 6–23)
Basophils Absolute: 0.1 10*3/uL (ref 0.0–0.1)
Basophils Relative: 0.6 % (ref 0.0–3.0)
Bilirubin, Direct: 0.1 mg/dL (ref 0.0–0.3)
CO2: 31 meq/L (ref 19–32)
Calcium: 9.2 mg/dL (ref 8.4–10.5)
Chloride: 100 meq/L (ref 96–112)
Creatinine, Ser: 0.6 mg/dL (ref 0.4–1.2)
Eosinophils Absolute: 0.1 10*3/uL (ref 0.0–0.7)
Eosinophils Relative: 1.4 % (ref 0.0–5.0)
GFR calc non Af Amer: 133.93 mL/min (ref >60)
Glucose, Bld: 92 mg/dL (ref 70–99)
HCT: 38.2 % (ref 36.0–46.0)
Hemoglobin: 12.9 g/dL (ref 12.0–15.0)
Hgb A1c MFr Bld: 5.9 % (ref 4.6–6.5)
Lymphocytes Relative: 29.1 % (ref 12.0–46.0)
Lymphs Abs: 2.3 10*3/uL (ref 0.7–4.0)
MCHC: 33.7 g/dL (ref 30.0–36.0)
MCV: 90.5 fL (ref 78.0–100.0)
Monocytes Absolute: 0.5 10*3/uL (ref 0.1–1.0)
Monocytes Relative: 6.6 % (ref 3.0–12.0)
Neutro Abs: 4.9 10*3/uL (ref 1.4–7.7)
Neutrophils Relative %: 62.3 % (ref 43.0–77.0)
Platelets: 255 10*3/uL (ref 150.0–400.0)
Potassium: 3.6 meq/L (ref 3.5–5.1)
RBC: 4.22 M/uL (ref 3.87–5.11)
RDW: 14.9 % — ABNORMAL HIGH (ref 11.5–14.6)
Sodium: 138 meq/L (ref 135–145)
TSH: 1.04 microintl units/mL (ref 0.35–5.50)
Total Bilirubin: 0.6 mg/dL (ref 0.3–1.2)
Total Protein: 7.4 g/dL (ref 6.0–8.3)
Vit D, 25-Hydroxy: 21 ng/mL — ABNORMAL LOW (ref 30–89)
WBC: 7.9 10*3/uL (ref 4.5–10.5)

## 2009-10-19 ENCOUNTER — Encounter: Payer: Self-pay | Admitting: Internal Medicine

## 2009-10-26 ENCOUNTER — Encounter: Admission: RE | Admit: 2009-10-26 | Discharge: 2009-10-26 | Payer: Self-pay | Admitting: Obstetrics and Gynecology

## 2009-11-06 ENCOUNTER — Encounter: Admission: RE | Admit: 2009-11-06 | Discharge: 2009-11-06 | Payer: Self-pay | Admitting: Obstetrics and Gynecology

## 2009-12-03 ENCOUNTER — Encounter: Payer: Self-pay | Admitting: Internal Medicine

## 2009-12-20 ENCOUNTER — Ambulatory Visit: Payer: Self-pay | Admitting: Internal Medicine

## 2010-01-01 ENCOUNTER — Encounter: Payer: Self-pay | Admitting: Internal Medicine

## 2010-01-01 LAB — CONVERTED CEMR LAB
BUN: 11 mg/dL (ref 6–23)
CO2: 31 meq/L (ref 19–32)
Calcium: 9.4 mg/dL (ref 8.4–10.5)
Chloride: 99 meq/L (ref 96–112)
Creatinine, Ser: 0.6 mg/dL (ref 0.4–1.2)
GFR calc non Af Amer: 128.86 mL/min (ref >60.00)
Glucose, Bld: 82 mg/dL (ref 70–99)
Potassium: 3.4 meq/L — ABNORMAL LOW (ref 3.5–5.1)
Sodium: 140 meq/L (ref 135–145)
Vit D, 25-Hydroxy: 30 ng/mL (ref 30–89)

## 2010-01-02 ENCOUNTER — Encounter: Payer: Self-pay | Admitting: Internal Medicine

## 2010-01-27 ENCOUNTER — Encounter: Payer: Self-pay | Admitting: Gastroenterology

## 2010-02-07 NOTE — Progress Notes (Signed)
Summary: loosing voice  Phone Note Call from Patient   Caller: Patient Summary of Call: Req to see md today since started taking 1 toprol a day pt states she he loosing her voice, also her ankles are swollen. Is it ok to add on today or tomorrow Initial call taken by: Orlan Leavens,  June 21, 2008 10:55 AM  Follow-up for Phone Call        it'll have to be tomorrow Follow-up by: Etta Grandchild MD,  June 21, 2008 11:10 AM  Additional Follow-up for Phone Call Additional follow up Details #1::        Notified pt made appt for tomorrow Additional Follow-up by: Orlan Leavens,  June 21, 2008 11:14 AM

## 2010-02-07 NOTE — Assessment & Plan Note (Signed)
Summary: 3 MO ROV/NWS   Vital Signs:  Patient profile:   55 year old female Menstrual status:  postmenopausal Height:      63 inches Weight:      267 pounds BMI:     47.47 O2 Sat:      97 % on Room air Temp:     98.3 degrees F oral Pulse rate:   97 / minute Pulse rhythm:   regular BP sitting:   108 / 72  (left arm) Cuff size:   large  Vitals Entered By: Rock Nephew CMA (January 17, 2009 11:29 AM)  Nutrition Counseling: Patient's BMI is greater than 25 and therefore counseled on weight management options.  O2 Flow:  Room air  Primary Care Provider:  Etta Grandchild MD   History of Present Illness: She returns for a BP check and is 2 weeks s/p Lap Band procedure. She is doing well and has lost about 23 pounds.  Hypertension History:      She complains of neurologic problems and side effects from treatment, but denies headache, chest pain, palpitations, dyspnea with exertion, peripheral edema, visual symptoms, and syncope.  She notes the following problems with antihypertensive medication side effects: dizziness and orthostasis.        Positive major cardiovascular risk factors include hypertension.  Negative major cardiovascular risk factors include female age less than 54 years old, no history of diabetes or hyperlipidemia, negative family history for ischemic heart disease, and non-tobacco-user status.        Further assessment for target organ damage reveals no history of ASHD, cardiac end-organ damage (CHF/LVH), stroke/TIA, peripheral vascular disease, renal insufficiency, or hypertensive retinopathy.     Preventive Screening-Counseling & Management  Alcohol-Tobacco     Alcohol drinks/day: 0     Smoking Status: never  Current Medications (verified): 1)  Multivitamin 2)  Vitamin D 1000 Unit Tabs (Cholecalciferol) .... One By Mouth Bid 3)  Asa 81mg  4)  Cymbalta 30 Mg Cpep (Duloxetine Hcl) .... Once Daily 5)  K-Tabs 10 Meq Cr-Tabs (Potassium Chloride) .... 2 By Mouth  Once Daily 6)  Tribenzor 40-5-25 Mg Tabs (Olmesartan-Amlodipine-Hctz) .... One By Mouth Each Day For High Blood Pressure  Allergies (verified): 1)  ! * Lisinopril 2)  ! * Diltiazem  Past History:  Past Medical History: Reviewed history from 07/13/2008 and no changes required. HYPERGLYCEMIA (ICD-790.29) PANCREATITIS, HX OF (ICD-V12.70) G E R D (ICD-530.81) Anxiety Disorder Hypertension Hx. of Tracheostomy Hx. Resp. Failure Chronic Cough ERCP(08/08/04) Hypokalemia Depression  Past Surgical History: Reviewed history from 04/01/2007 and no changes required. posr ERCP Appendectomy Breast Surgery: reduction Cholecystectomy Total Abdominal Hysterectomy Hammer Toe Surgery Tubal Ligation Intestinal repair(twisted intestine)1999  Family History: Reviewed history from 06/01/2008 and no changes required. Family History Diabetes 1st degree relative Family History Hypertension  Social History: Reviewed history from 06/01/2008 and no changes required. 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   GI:  Denies abdominal pain, change in bowel habits, excessive appetite, indigestion, loss of appetite, nausea, vomiting, vomiting blood, and yellowish skin color.  Physical Exam  General:  alert, well-developed, well-nourished, well-hydrated, and overweight-appearing.   Eyes:  pink moist mm, no icterus Mouth:  Oral mucosa and oropharynx without lesions or exudates.  Teeth in good repair. Neck:  No deformities, masses, or tenderness noted. Lungs:  Normal respiratory effort, chest expands symmetrically.  Lungs are clear to auscultation, no crackles or wheezes. Heart:  Normal rate and regular rhythm. S1 and S2 normal without gallop, murmur, click, rub or other extra sounds. Abdomen:   soft, non-tender, normal bowel sounds, no distention, no masses, no hepatomegaly, and no splenomegaly.  LUQ shows the port sight is healing well. Msk:  normal ROM, no joint tenderness, no joint swelling, and no joint warmth.   Extremities:  1+ left pedal edema and 1+ right pedal edema.   Neurologic:  No cranial nerve deficits noted. Station and gait are normal. Plantar reflexes are down-going bilaterally. DTRs are symmetrical throughout. Sensory, motor and coordinative functions appear intact. Skin:  Intact without suspicious lesions or rashes Psych:  Oriented X3, memory intact for recent and remote, normally interactive, good eye contact, not anxious appearing, not depressed appearing, not agitated, not suicidal, and subdued.     Impression & Recommendations:  Problem # 1:  HYPERTENSION (ICD-401.9) Assessment Improved  The following medications were removed from the medication list:    Tribenzor 40-5-25 Mg Tabs (Olmesartan-amlodipine-hctz) ..... One by mouth each day for high blood pressure  BP today: 108/72 Prior BP: 130/84 (11/24/2008)  10 Yr Risk Heart Disease: 5 % Prior 10 Yr Risk Heart Disease: 8 % (11/24/2008)  Labs Reviewed: K+: 3.5 (11/24/2008) Creat: : 0.7 (11/24/2008)   Chol: 165 (06/01/2008)   HDL: 46.60 (06/01/2008)   LDL: 104 (06/01/2008)   TG: 74.0 (06/01/2008)  Problem # 2:  HYPOKALEMIA (ICD-276.8) Assessment: Improved  Complete Medication List: 1)  Multivitamin  2)  Vitamin D 1000 Unit Tabs (Cholecalciferol) .... One by mouth bid 3)  Asa 81mg   4)  Cymbalta 30 Mg Cpep (Duloxetine hcl) .... Once daily  Hypertension Assessment/Plan:      The patient's hypertensive risk group is category A: No risk factors and no target organ damage.  Her calculated 10 year risk of coronary heart disease is 5 %.  Today's blood pressure is 108/72.  Her blood pressure goal is < 140/90.  Patient Instructions: 1)  Please schedule a follow-up appointment in 2 months. 2)  It is  important that you exercise regularly at least 20 minutes 5 times a week. If you develop chest pain, have severe difficulty breathing, or feel very tired , stop exercising immediately and seek medical attention. 3)  You need to lose weight. Consider a lower calorie diet and regular exercise.  4)  Check your Blood Pressure regularly. If it is above 140/90: you should make an appointment.   Pneumonia Vaccine (to be given today)

## 2010-02-07 NOTE — Assessment & Plan Note (Signed)
Summary: LOOSING VOICE/NWS PER TRIAGE   Vital Signs:  Patient profile:   55 year old female Menstrual status:  postmenopausal Height:      63 inches Weight:      295.50 pounds BMI:     52.53 O2 Sat:      97 % on Room air Temp:     98.0 degrees F oral Pulse rate:   76 / minute Pulse rhythm:   regular BP sitting:   122 / 78  (left arm) Cuff size:   large  Vitals Entered By: Rock Nephew CMA (June 22, 2008 2:45 PM)  Nutrition Counseling: Patient's BMI is greater than 25 and therefore counseled on weight management options.  O2 Flow:  Room air  Primary Care Provider:  Etta Grandchild MD  CC:  Cough.  History of Present Illness: She returns with a complaint of laryngitis and NP cough that developed 4 days ago and has dissipated.  She was not able to stop the HCTZ or taper the Toprol due to ankle swelling/tightness and elevated BP.  Cough      This is a 55 year old woman who presents with Cough.  The symptoms began 4 days ago.  The intensity is described as mild.  The patient reports non-productive cough, but denies pleuritic chest pain, shortness of breath, wheezing, exertional dyspnea, fever, and hemoptysis.  Associated symtpoms include cold/URI symptoms and peripheral edema.  The patient denies the following symptoms: sore throat, nasal congestion, chronic rhinitis, weight loss, and acid reflux symptoms.    Hypertension History:      She complains of peripheral edema, but denies headache, chest pain, palpitations, dyspnea with exertion, orthopnea, PND, visual symptoms, neurologic problems, syncope, and side effects from treatment.  She notes no problems with any antihypertensive medication side effects.        Positive major cardiovascular risk factors include hypertension.  Negative major cardiovascular risk factors include female age less than 51 years old, no history of diabetes or hyperlipidemia, negative family history for ischemic heart disease, and non-tobacco-user status.         Further assessment for target organ damage reveals no history of ASHD, cardiac end-organ damage (CHF/LVH), stroke/TIA, peripheral vascular disease, renal insufficiency, or hypertensive retinopathy.     Current Medications (verified): 1)  Potassium 2)  Toprol Xl 25 Mg Xr24h-Tab (Metoprolol Succinate) .... 1/2 Tab Once Daily 3)  Multivitamin 4)  Vitamin D 1000 Unit Tabs (Cholecalciferol) .... One By Mouth Bid 5)  Hydrochlorothiazide 25 Mg Tabs (Hydrochlorothiazide) .Marland Kitchen.. 1 Once Daily  Allergies (verified): 1)  ! * Lisinopril 2)  ! * Diltiazem  Past History:  Past Medical History: Reviewed history from 06/01/2008 and no changes required. HYPERGLYCEMIA (ICD-790.29) PANCREATITIS, HX OF (ICD-V12.70) G E R D (ICD-530.81) Anxiety Disorder Hypertension Hx. of Tracheostomy Hx. Resp. Failure Chronic Cough ERCP(08/08/04) Hypokalemia  Past Surgical History: Reviewed history from 04/01/2007 and no changes required. posr ERCP Appendectomy Breast Surgery: reduction Cholecystectomy Total Abdominal Hysterectomy Hammer Toe Surgery Tubal Ligation Intestinal repair(twisted intestine)1999  Family History: Reviewed history from 06/01/2008 and no changes required. Family History Diabetes 1st degree relative Family History Hypertension  Social History: Reviewed history from 06/01/2008 and no changes required. 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  The patient denies anorexia, fever, vision loss, decreased hearing, hematuria, difficulty walking, depression, enlarged lymph nodes, and angioedema.    Physical  Exam  General:  alert, well-developed, well-nourished, well-hydrated, cooperative to examination, good hygiene, and overweight-appearing.   Head:  normocephalic and atraumatic.   Eyes:  vision grossly intact and pupils equal.   Ears:  R ear normal and L  ear normal.   Nose:  no nasal discharge, no mucosal pallor, and no mucosal edema.   Mouth:  good dentition, no gingival abnormalities, pharynx pink and moist, no erythema, and no exudates.   Neck:  supple, full ROM, no masses, and normal carotid upstroke.   Lungs:  Normal respiratory effort, chest expands symmetrically. Lungs are clear to auscultation, no crackles or wheezes. Heart:  Normal rate and regular rhythm. S1 and S2 normal without gallop, murmur, click, rub or other extra sounds. Abdomen:  soft, non-tender, normal bowel sounds, no distention, no masses, no hepatomegaly, and no splenomegaly.   Msk:  No deformity or scoliosis noted of thoracic or lumbar spine.   Pulses:  R and L carotid,radial,femoral,dorsalis pedis and posterior tibial pulses are full and equal bilaterally Extremities:  No clubbing, cyanosis, edema, or deformity noted with normal full range of motion of all joints.   Neurologic:  No cranial nerve deficits noted. Station and gait are normal. Plantar reflexes are down-going bilaterally. DTRs are symmetrical throughout. Sensory, motor and coordinative functions appear intact. Skin:  Intact without suspicious lesions or rashes Cervical Nodes:  No lymphadenopathy noted Axillary Nodes:  No palpable lymphadenopathy Inguinal Nodes:  No significant adenopathy Psych:  Oriented X3, memory intact for recent and remote, good eye contact, not anxious appearing, not agitated, flat affect, and subdued.     Impression & Recommendations:  Problem # 1:  HYPERTENSION (ICD-401.9) Assessment Unchanged  Her updated medication list for this problem includes:    Toprol Xl 25 Mg Xr24h-tab (Metoprolol succinate) .Marland Kitchen... 1/2 tab once daily    Hydrochlorothiazide 25 Mg Tabs (Hydrochlorothiazide) .Marland Kitchen... 1 once daily  Problem # 2:  LARYNGITIS-ACUTE (ICD-464.00) Assessment: New observe for any changes or worsening, supportive acre in the meantime  Complete Medication List: 1)  Potassium    2)  Toprol Xl 25 Mg Xr24h-tab (Metoprolol succinate) .... 1/2 tab once daily 3)  Multivitamin  4)  Vitamin D 1000 Unit Tabs (Cholecalciferol) .... One by mouth bid 5)  Hydrochlorothiazide 25 Mg Tabs (Hydrochlorothiazide) .Marland Kitchen.. 1 once daily  Hypertension Assessment/Plan:      The patient's hypertensive risk group is category A: No risk factors and no target organ damage.  Her calculated 10 year risk of coronary heart disease is 8 %.  Today's blood pressure is 122/78.  Her blood pressure goal is < 140/90.  Patient Instructions: 1)  Please schedule a follow-up appointment in 2 weeks. 2)  It is important that you exercise regularly at least 20 minutes 5 times a week. If you develop chest pain, have severe difficulty breathing, or feel very tired , stop exercising immediately and seek medical attention. 3)  You need to lose weight. Consider a lower calorie diet and regular exercise.  4)  Check your Blood Pressure regularly. If it is above 140/90: you should make an appointment. 5)  Get plenty of rest, drink lots of clear liquids, and use Tylenol or Ibuprofen for fever and comfort. Return in 7-10 days if you're not better:sooner if you're feeling worse.

## 2010-02-07 NOTE — Progress Notes (Signed)
Summary: Toprol  Phone Note Call from Patient   Summary of Call: H [phone] C [phone]  Patient states she was instructed by Dr. Yetta Barre to 1/2 her 50mg  Toprol. Patient states her Toprol dosage is actually 25mg . Do you still want pt. to 1/2 the 25mg  dosage? Pls. advisel  Initial call taken by: Beola Cord, CMA,  June 16, 2008 10:23 AM  Follow-up for Phone Call        yes, 1/2 of the 25 mg Follow-up by: Etta Grandchild MD,  June 18, 2008 5:17 PM  Additional Follow-up for Phone Call Additional follow up Details #1::        L/M to inform pt. about the correct dosage of medication. Additional Follow-up by: Beola Cord, CMA,  June 19, 2008 8:11 AM    Additional Follow-up for Phone Call Additional follow up Details #2::    Pt has stopped HCTZ as directed, c/o feeling "tight", in legs & arms. She resumed hctz 25mg  & toprol 25mg  on saturday. BP 138/117 on saturday prior to resuming meds. Pt has not checked it since, but will today.   Is it ok to take 1/2 toprol and full 25mg  hctz? Pt has potassium that she takes every other day. Ok vm on [id number].    Follow-up by: Lamar Sprinkles,  June 19, 2008 12:19 PM  Additional Follow-up for Phone Call Additional follow up Details #3:: Details for Additional Follow-up Action Taken: yes  Pt informed EMR UPDATED..........................Marland KitchenLamar Sprinkles  June 19, 2008 2:02 PM  Additional Follow-up by: Etta Grandchild MD,  June 19, 2008 12:29 PM  New/Updated Medications: TOPROL XL 25 MG XR24H-TAB (METOPROLOL SUCCINATE) 1/2 tab once daily HYDROCHLOROTHIAZIDE 25 MG TABS (HYDROCHLOROTHIAZIDE) 1 once daily

## 2010-02-07 NOTE — Letter (Signed)
Summary: Results Follow-up Letter  [facility]  [address] [place], [place] [address]   Phone: [phone]  Fax: [phone]    01/02/2010  [address] [place], [place]  [address]  Dear Ms. Hise,   The following are the results of your recent test(s):  Test     Result     Vitamin D     borderline low Potassium     slightly low Kidney     normal   _________________________________________________________  Please call for an appointment as directed _________________________________________________________ _________________________________________________________ _________________________________________________________  Sincerely,  [name] MD [facility]

## 2010-02-07 NOTE — Procedures (Signed)
Summary: Gastroenterology   Gastroenterology   Imported By: Lowry Ram CMA 04/02/2007 10:31:45  _____________________________________________________________________  External Attachment:    Type:   Image     Comment:   External Document

## 2010-02-07 NOTE — Letter (Signed)
[facility]: [facility]   Imported By: Lester [place] 01/11/2009 08:29:49  _____________________________________________________________________  External Attachment:    Type:   Image     Comment:   External Document

## 2010-02-07 NOTE — Assessment & Plan Note (Signed)
Summary: NOT FEELING WELLL//VGJ   Vital Signs:  Patient profile:   55 year old female Menstrual status:  postmenopausal Weight:      289 pounds Temp:     98.8 degrees F oral BP sitting:   142 / 84  (left arm) Cuff size:   large  Vitals Entered By: Alfred Levins, CMA (September 30, 2008 11:54 AM) CC: ears popping   History of Present Illness: here with 3 days of ears popping, stuffy head, and elevated BP. Her BP has been in the 170s over 90s for several days. No fever or cough.   Allergies: 1)  ! * Lisinopril 2)  ! * Diltiazem  Past History:  Past Medical History: Reviewed history from 07/13/2008 and no changes required. HYPERGLYCEMIA (ICD-790.29) PANCREATITIS, HX OF (ICD-V12.70) G E R D (ICD-530.81) Anxiety Disorder Hypertension Hx. of Tracheostomy Hx. Resp. Failure Chronic Cough ERCP(08/08/04) Hypokalemia Depression  Review of Systems  The patient denies anorexia, fever, weight loss, weight gain, vision loss, decreased hearing, hoarseness, chest pain, syncope, dyspnea on exertion, peripheral edema, prolonged cough, headaches, hemoptysis, abdominal pain, melena, hematochezia, severe indigestion/heartburn, hematuria, incontinence, genital sores, muscle weakness, suspicious skin lesions, transient blindness, difficulty walking, depression, unusual weight change, abnormal bleeding, enlarged lymph nodes, angioedema, breast masses, and testicular masses.    Physical Exam  General:  Well-developed,well-nourished,in no acute distress; alert,appropriate and cooperative throughout examination Head:  Normocephalic and atraumatic without obvious abnormalities. No apparent alopecia or balding. Eyes:  No corneal or conjunctival inflammation noted. EOMI. Perrla. Funduscopic exam benign, without hemorrhages, exudates or papilledema. Vision grossly normal. Ears:  External ear exam shows no significant lesions or deformities.  Otoscopic examination reveals clear canals, tympanic  membranes are intact bilaterally without bulging, retraction, inflammation or discharge. Hearing is grossly normal bilaterally. Nose:  External nasal examination shows no deformity or inflammation. Nasal mucosa are pink and moist without lesions or exudates. Mouth:  Oral mucosa and oropharynx without lesions or exudates.  Teeth in good repair. Neck:  No deformities, masses, or tenderness noted. Lungs:  Normal respiratory effort, chest expands symmetrically. Lungs are clear to auscultation, no crackles or wheezes. Heart:  Normal rate and regular rhythm. S1 and S2 normal without gallop, murmur, click, rub or other extra sounds.   Impression & Recommendations:  Problem # 1:  ALLERGIC RHINITIS (ICD-477.9)  Problem # 2:  HYPERTENSION (ICD-401.9)  The following medications were removed from the medication list:    Toprol Xl 25 Mg Xr24h-tab (Metoprolol succinate) .Marland Kitchen... 1/2 tab once daily Her updated medication list for this problem includes:    Hydrochlorothiazide 25 Mg Tabs (Hydrochlorothiazide) .Marland Kitchen... 1 once daily  Complete Medication List: 1)  Multivitamin  2)  Vitamin D 1000 Unit Tabs (Cholecalciferol) .... One by mouth bid 3)  Hydrochlorothiazide 25 Mg Tabs (Hydrochlorothiazide) .Marland Kitchen.. 1 once daily 4)  Asa 81mg   5)  Cymbalta 30 Mg Cpep (Duloxetine hcl) .... Once daily 6)  Klor-con M20 20 Meq Cr-tabs (Potassium chloride crys cr) .... Once daily  Patient Instructions: 1)  I think her BP is probably stable, but she will follow this closely at home. She is having some sinus congestion, so I suggested she try Mucinex as needed .  2)  Please schedule a follow-up appointment as needed .  Prescriptions: KLOR-CON M20 20 MEQ CR-TABS (POTASSIUM CHLORIDE CRYS CR) once daily  #30 x 2   Entered and Authorized by:   Nelwyn Salisbury MD   Signed by:   Nelwyn Salisbury MD on 09/30/2008  Method used:   Electronically to        [facility]  [address] [id number] (retail)       [address]       [place], [place]  [address]       Ph: [phone] or [phone]       Fax: [phone]   RxID:   [id number]

## 2010-02-07 NOTE — Assessment & Plan Note (Signed)
Summary: 2 MO ROV /NWS  #   Vital Signs:  Patient profile:   55 year old female Menstrual status:  postmenopausal Height:      63 inches Weight:      254 pounds O2 Sat:      95 % on Room air Temp:     98.5 degrees F oral Pulse rate:   77 / minute Pulse rhythm:   regular Resp:     16 per minute BP sitting:   140 / 88  (left arm) Cuff size:   large  Vitals Entered By: Rock Nephew CMA (March 20, 2009 8:06 AM)  O2 Flow:  Room air CC: follow-up visit, Hypertension Management Is Patient Diabetic? No Pain Assessment Patient in pain? no        Primary Care Provider:  Etta Grandchild MD  CC:  follow-up visit and Hypertension Management.  History of Present Illness: She returns for f/up.  Hypertension History:      She complains of peripheral edema, but denies headache, chest pain, palpitations, dyspnea with exertion, orthopnea, PND, visual symptoms, neurologic problems, syncope, and side effects from treatment.  She notes no problems with any antihypertensive medication side effects.        Positive major cardiovascular risk factors include hypertension.  Negative major cardiovascular risk factors include female age less than 74 years old, no history of diabetes or hyperlipidemia, negative family history for ischemic heart disease, and non-tobacco-user status.        Further assessment for target organ damage reveals no history of ASHD, cardiac end-organ damage (CHF/LVH), stroke/TIA, peripheral vascular disease, renal insufficiency, or hypertensive retinopathy.     Current Medications (verified): 1)  Multivitamin 2)  Vitamin D 1000 Unit Tabs (Cholecalciferol) .... One By Mouth Bid 3)  Asa 81mg  4)  Cymbalta 30 Mg Cpep (Duloxetine Hcl) .... Once Daily 5)  Hydrochlorothiazide 25 Mg Tabs (Hydrochlorothiazide) .... Take 1 Tablet By Mouth Once A Day  Allergies (verified): 1)  ! * Lisinopril 2)  ! * Diltiazem  Past History:  Past Medical History: Reviewed history from  07/13/2008 and no changes required. HYPERGLYCEMIA (ICD-790.29) PANCREATITIS, HX OF (ICD-V12.70) G E R D (ICD-530.81) Anxiety Disorder Hypertension Hx. of Tracheostomy Hx. Resp. Failure Chronic Cough ERCP(08/08/04) Hypokalemia Depression  Past Surgical History: Reviewed history from 04/01/2007 and no changes required. posr ERCP Appendectomy Breast Surgery: reduction Cholecystectomy Total Abdominal Hysterectomy Hammer Toe Surgery Tubal Ligation Intestinal repair(twisted intestine)1999  Family History: Reviewed history from 06/01/2008 and no changes required. Family History Diabetes 1st degree relative Family History Hypertension  Social History: Reviewed history from 06/01/2008 and no changes required. Occupation: Water engineer Married Never Smoked Alcohol use-no Drug use-no Regular exercise-no  Review of Systems  The patient denies anorexia, fever, weight loss, weight gain, peripheral edema, and abdominal pain.   Psych:  Complains of depression; denies alternate hallucination ( auditory/visual), anxiety, easily angered, easily tearful, irritability, mental problems, panic attacks, sense of great danger, suicidal thoughts/plans, thoughts of violence, unusual visions or sounds, and thoughts /plans of harming others. Endo:  Denies cold intolerance, excessive hunger, excessive thirst, excessive urination, heat intolerance, polyuria, and weight change.  Physical Exam  General:  alert, well-developed, well-nourished, well-hydrated, and overweight-appearing.   Mouth:  Oral mucosa and oropharynx without lesions or exudates.  Teeth in good repair. Neck:  No deformities, masses, or tenderness noted. Lungs:  Normal respiratory effort, chest expands symmetrically. Lungs are clear to auscultation, no crackles or wheezes. Heart:  Normal rate and regular rhythm.  S1 and S2 normal without gallop, murmur, click, rub or other extra sounds. Abdomen:  soft, non-tender, normal bowel sounds,  no distention, no masses, no hepatomegaly, and no splenomegaly.  LUQ shows the port sight is healing well. Msk:  normal ROM, no joint tenderness, no joint swelling, and no joint warmth.   Pulses:  R and L carotid,radial,femoral,dorsalis pedis and posterior tibial pulses are full and equal bilaterally Extremities:  1+ left pedal edema and 1+ right pedal edema.   Neurologic:  No cranial nerve deficits noted. Station and gait are normal. Plantar reflexes are down-going bilaterally. DTRs are symmetrical throughout. Sensory, motor and coordinative functions appear intact. Skin:  Intact without suspicious lesions or rashes Cervical Nodes:  No lymphadenopathy noted Psych:  Oriented X3, memory intact for recent and remote, normally interactive, good eye contact, not anxious appearing, not depressed appearing, not agitated, not suicidal, and subdued.     Impression & Recommendations:  Problem # 1:  VITAMIN D DEFICIENCY (ICD-268.9) Assessment Unchanged  Orders: Venipuncture (29528) T-Vitamin D (25-Hydroxy) (41324-40102) TLB-BMP (Basic Metabolic Panel-BMET) (80048-METABOL) TLB-CBC Platelet - w/Differential (85025-CBCD) TLB-A1C / Hgb A1C (Glycohemoglobin) (83036-A1C)  Problem # 2:  HYPOKALEMIA (ICD-276.8) Assessment: Improved  Orders: Venipuncture (72536) T-Vitamin D (25-Hydroxy) (64403-47425) TLB-BMP (Basic Metabolic Panel-BMET) (80048-METABOL) TLB-CBC Platelet - w/Differential (85025-CBCD) TLB-A1C / Hgb A1C (Glycohemoglobin) (83036-A1C)  Problem # 3:  HYPERTENSION (ICD-401.9) Assessment: Deteriorated  Her updated medication list for this problem includes:    Hydrochlorothiazide 25 Mg Tabs (Hydrochlorothiazide) .Marland Kitchen... Take 1 tablet by mouth once a day  Orders: Venipuncture (95638) T-Vitamin D (25-Hydroxy) (75643-32951) TLB-BMP (Basic Metabolic Panel-BMET) (80048-METABOL) TLB-CBC Platelet - w/Differential (85025-CBCD) TLB-A1C / Hgb A1C (Glycohemoglobin) (83036-A1C)  BP today:  140/88 Prior BP: 108/72 (01/17/2009)  10 Yr Risk Heart Disease: 11 % Prior 10 Yr Risk Heart Disease: 5 % (01/17/2009)  Labs Reviewed: K+: 3.5 (11/24/2008) Creat: : 0.7 (11/24/2008)   Chol: 165 (06/01/2008)   HDL: 46.60 (06/01/2008)   LDL: 104 (06/01/2008)   TG: 74.0 (06/01/2008)  Problem # 4:  HYPERGLYCEMIA (ICD-790.29) Assessment: Unchanged  Orders: Venipuncture (88416) T-Vitamin D (25-Hydroxy) (60630-16010) TLB-BMP (Basic Metabolic Panel-BMET) (80048-METABOL) TLB-CBC Platelet - w/Differential (85025-CBCD) TLB-A1C / Hgb A1C (Glycohemoglobin) (83036-A1C)  Labs Reviewed: Creat: 0.7 (11/24/2008)     Complete Medication List: 1)  Multivitamin  2)  Vitamin D 1000 Unit Tabs (Cholecalciferol) .... One by mouth bid 3)  Asa 81mg   4)  Cymbalta 30 Mg Cpep (Duloxetine hcl) .... Once daily 5)  Hydrochlorothiazide 25 Mg Tabs (Hydrochlorothiazide) .... Take 1 tablet by mouth once a day  Other Orders: Tdap => 54yrs IM (93235) Admin 1st Vaccine (57322)  Hypertension Assessment/Plan:      The patient's hypertensive risk group is category A: No risk factors and no target organ damage.  Her calculated 10 year risk of coronary heart disease is 11 %.  Today's blood pressure is 140/88.  Her blood pressure goal is < 140/90.  Patient Instructions: 1)  Please schedule a follow-up appointment in 4 months. 2)  It is important that you exercise regularly at least 20 minutes 5 times a week. If you develop chest pain, have severe difficulty breathing, or feel very tired , stop exercising immediately and seek medical attention. 3)  You need to lose weight. Consider a lower calorie diet and regular exercise.  4)  Check your Blood Pressure regularly. If it is above 140/90: you should make an appointment. Prescriptions: HYDROCHLOROTHIAZIDE 25 MG TABS (HYDROCHLOROTHIAZIDE) Take 1 tablet by mouth once a day  #  30 x 3   Entered and Authorized by:   Etta Grandchild MD   Signed by:   Etta Grandchild MD on  03/20/2009   Method used:   [facility] to        [facility]  [address] [id number]       [address]       [place], [place]  [address]       Ph: [phone] or [phone]       Fax: [phone]   RxID:   [id number] HYDROCHLOROTHIAZIDE 25 MG TABS (HYDROCHLOROTHIAZIDE) Take 1 tablet by mouth once a day  #90 x 3   Entered and Authorized by:   Etta Grandchild MD   Signed by:   Etta Grandchild MD on 03/20/2009   Method used:   Electronically to        [facility]* (mail-order)             ,          Ph: [phone]       Fax: [phone]   RxID:   [id number] CYMBALTA 30 MG CPEP (DULOXETINE HCL) once daily  #90 x 3   Entered and Authorized by:   Etta Grandchild MD   Signed by:   Etta Grandchild MD on 03/20/2009   Method used:   Electronically to        [facility]* (mail-order)             ,          Ph: [phone]       Fax: [phone]   RxID:   [id number]    Immunizations Administered:  Tetanus Vaccine:    Vaccine Type: Tdap    Site: right deltoid    Mfr: GlaxoSmithKline    Dose: 0.5 ml    Route: IM    Given by: Rock Nephew CMA Admin by Sydell Axon SMA    Exp. Date: 03/31/2011    Lot #: [id number]    VIS given: 11/24/06 version given March 20, 2009.

## 2010-02-07 NOTE — Letter (Signed)
[facility]   Imported By: Sherian Rein 07/24/2009 11:09:39  _____________________________________________________________________  External Attachment:    Type:   Image     Comment:   External Document

## 2010-02-07 NOTE — Letter (Signed)
Summary: [facility]   Imported By: Lester Taos 02/09/2009 07:44:31  _____________________________________________________________________  External Attachment:    Type:   Image     Comment:   External Document

## 2010-02-07 NOTE — Assessment & Plan Note (Signed)
Summary: change medication/cd   Vital Signs:  Patient profile:   55 year old female Menstrual status:  postmenopausal Height:      63 inches Weight:      253.50 pounds BMI:     45.07 O2 Sat:      96 % on Room air Temp:     98.8 degrees F oral Pulse rate:   83 / minute Pulse rhythm:   regular Resp:     16 per minute BP sitting:   136 / 80  (left arm) Cuff size:   large  Vitals Entered By: Jarome Lamas (October 18, 2009 8:46 AM)  Nutrition Counseling: Patient's BMI is greater than 25 and therefore counseled on weight management options.  O2 Flow:  Room air CC: wants to talk about medication/pb, Depressive symptoms   Primary Care Provider:  Etta Grandchild MD  CC:  wants to talk about medication/pb and Depressive symptoms.  History of Present Illness:  Depressive Symptoms      This is a 55 year old woman who presents with Depressive symptoms.  The symptoms began 2 weeks ago.  The severity is described as mild.  The patient reports depressed mood, loss of interest/pleasure, hypersomnia, and psychomotor retardation, but denies significant weight loss, significant weight gain, insomnia, and psychomotor agitation.  The patient also reports fatigue or loss of energy, feelings of worthlessness, diminished concentration, and indecisiveness.  The patient denies thoughts of death, thoughts of suicide, suicidal intent, and suicidal plans.  Patient's past history includes chronic medical illness and depression.  The patient denies abnormally elevated mood, abnormally irritable mood, decreased need for sleep, increased talkativeness, distractibility, flight of ideas, increased goal-directed activity, and inflated self-esteem/ grandiosity.    Preventive Screening-Counseling & Management  Alcohol-Tobacco     Alcohol drinks/day: 0     Smoking Status: never     Tobacco Counseling: not indicated; no tobacco use  Hep-HIV-STD-Contraception     Hepatitis Risk: no risk noted     HIV Risk: no  risk noted     STD Risk: no risk noted      Drug Use:  no.    Clinical Review Panels:  Immunizations   Last Tetanus Booster:  Tdap (03/20/2009)   Last Flu Vaccine:  Fluvax 3+ (10/18/2009)  Lipid Management   Cholesterol:  165 (06/01/2008)   LDL (bad choesterol):  104 (06/01/2008)   HDL (good cholesterol):  46.60 (06/01/2008)  Diabetes Management   HgBA1C:  5.6 (03/20/2009)   Creatinine:  0.6 (03/20/2009)   Last Flu Vaccine:  Fluvax 3+ (10/18/2009)  CBC   WBC:  11.4 (03/20/2009)   RBC:  4.48 (03/20/2009)   Hgb:  13.3 (03/20/2009)   Hct:  40.4 (03/20/2009)   Platelets:  273.0 (03/20/2009)   MCV  90.2 (03/20/2009)   MCHC  33.0 (03/20/2009)   RDW  13.7 (03/20/2009)   PMN:  70.9 (03/20/2009)   Lymphs:  23.2 (03/20/2009)   Monos:  4.6 (03/20/2009)   Eosinophils:  0.7 (03/20/2009)   Basophil:  0.6 (03/20/2009)  Complete Metabolic Panel   Glucose:  85 (03/20/2009)   Sodium:  140 (03/20/2009)   Potassium:  3.6 (03/20/2009)   Chloride:  102 (03/20/2009)   CO2:  31 (03/20/2009)   BUN:  13 (03/20/2009)   Creatinine:  0.6 (03/20/2009)   Albumin:  3.8 (06/01/2008)   Total Protein:  7.5 (06/01/2008)   Calcium:  9.2 (03/20/2009)   Total Bili:  0.7 (06/01/2008)   Alk Phos:  90 (06/01/2008)  SGPT (ALT):  23 (06/01/2008)   SGOT (AST):  21 (06/01/2008)   Medications Prior to Update: 1)  Multivitamin 2)  Vitamin D 1000 Unit Tabs (Cholecalciferol) .... One By Mouth Bid 3)  Asa 81mg  4)  Cymbalta 30 Mg Cpep (Duloxetine Hcl) .... Once Daily 5)  Hydrochlorothiazide 25 Mg Tabs (Hydrochlorothiazide) .... Take 1 Tablet By Mouth Once A Day 6)  Naproxen 500 Mg Tabs (Naproxen) .Marland Kitchen.. 1 Tab By Mouth Two Times A Day X7-10 Days and Then As Needed.  Take W/ Food.  Current Medications (verified): 1)  Multivitamin 2)  Vitamin D 1000 Unit Tabs (Cholecalciferol) .... One By Mouth Bid 3)  Asa 81mg  4)  Hydrochlorothiazide 25 Mg Tabs (Hydrochlorothiazide) .... Take 1 Tablet By Mouth Once A  Day 5)  Naproxen 500 Mg Tabs (Naproxen) .Marland Kitchen.. 1 Tab By Mouth Two Times A Day X7-10 Days and Then As Needed.  Take W/ Food. 6)  Cymbalta 60 Mg Cpep (Duloxetine Hcl) .... One By Mouth Once Daily For Depression  Allergies (verified): 1)  ! * Lisinopril 2)  ! * Diltiazem  Past History:  Past Medical History: Last updated: 05/25/2009 HYPERGLYCEMIA  PANCREATITIS, HX OF  G E R D Anxiety Disorder Hypertension Hx. of Tracheostomy Hx. Resp. 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 Family History Diabetes 1st degree relative Family History Hypertension  Social History: Reviewed history from 06/01/2008 and no changes required. 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  The patient denies anorexia, fever, weight loss, weight gain, chest pain, dyspnea on exertion, peripheral edema, abdominal pain, hematuria, and enlarged lymph nodes.   Endo:  Denies cold intolerance, excessive hunger, excessive thirst, excessive urination, heat intolerance, polyuria, and weight change.  Physical Exam  General:   alert, well-developed, well-nourished, well-hydrated, appropriate dress, normal appearance, healthy-appearing, cooperative to examination, good hygiene, and overweight-appearing.   Head:  normocephalic and atraumatic.   Mouth:  Oral mucosa and oropharynx without lesions or exudates.  Teeth in good repair. Neck:  No deformities, masses, or tenderness noted. Lungs:  normal respiratory effort, no intercostal retractions, no accessory muscle use, normal breath sounds, no dullness, no fremitus, no crackles, and no wheezes.   Heart:  normal rate, regular rhythm, no murmur, no gallop, no rub, no JVD, and no HJR.   Abdomen:  soft, non-tender, normal bowel sounds, no distention, no masses, no guarding, no rigidity, no rebound tenderness, no hepatomegaly, and no splenomegaly.   Msk:  normal ROM, no joint tenderness, no joint swelling, no joint warmth, no redness over joints, no joint deformities, no joint instability, no crepitation, and no muscle atrophy.   Pulses:  R and L carotid,radial,femoral,dorsalis pedis and posterior tibial pulses are full and equal bilaterally Extremities:  No clubbing, cyanosis, edema, or deformity noted with normal full range  of motion of all joints.   Neurologic:  No cranial nerve deficits noted. Station and gait are normal. Plantar reflexes are down-going bilaterally. DTRs are symmetrical throughout. Sensory, motor and coordinative functions appear intact. Skin:  Intact without suspicious lesions or rashes Psych:  Oriented X3, memory intact for recent and remote, good eye contact, not anxious appearing, not agitated, not suicidal, not homicidal, dysphoric affect, and subdued.     Impression & Recommendations:  Problem # 1:  DEPRESSION (ICD-311) Assessment Deteriorated  The following medications were removed from the medication list:    Cymbalta 30 Mg Cpep (Duloxetine hcl) ..... Once daily Her updated medication list for this problem includes:    Cymbalta 60 Mg Cpep  (Duloxetine hcl) ..... One by mouth once daily for depression  Discussed treatment options, including trial of antidpressant medication. Will refer to behavioral health. Follow-up call in in 24-48 hours and recheck in 2 weeks, sooner as needed. Patient agrees to call if any worsening of symptoms or thoughts of doing harm arise. Verified that the patient has no suicidal ideation at this time.   Problem # 2:  VITAMIN D DEFICIENCY (ICD-268.9) Assessment: Unchanged  Orders: Venipuncture (16109) TLB-BMP (Basic Metabolic Panel-BMET) (80048-METABOL) TLB-CBC Platelet - w/Differential (85025-CBCD) TLB-Hepatic/Liver Function Pnl (80076-HEPATIC) TLB-TSH (Thyroid Stimulating Hormone) (84443-TSH) TLB-A1C / Hgb A1C (Glycohemoglobin) (83036-A1C) T-Vitamin D (25-Hydroxy) (60454-09811)  Problem # 3:  HYPERTENSION (ICD-401.9) Assessment: Improved  Her updated medication list for this problem includes:    Hydrochlorothiazide 25 Mg Tabs (Hydrochlorothiazide) .Marland Kitchen... Take 1 tablet by mouth once a day  Orders: Venipuncture (91478) TLB-BMP (Basic Metabolic Panel-BMET) (80048-METABOL) TLB-CBC Platelet - w/Differential (85025-CBCD) TLB-Hepatic/Liver Function Pnl (80076-HEPATIC) TLB-TSH (Thyroid Stimulating Hormone) (84443-TSH) TLB-A1C / Hgb A1C (Glycohemoglobin) (83036-A1C) T-Vitamin D (25-Hydroxy) (29562-13086)  BP today: 136/80 Prior BP: 118/80 (07/23/2009)  Prior 10 Yr Risk Heart Disease: 8 % (07/23/2009)  Labs Reviewed: K+: 3.6 (03/20/2009) Creat: : 0.6 (03/20/2009)   Chol: 165 (06/01/2008)   HDL: 46.60 (06/01/2008)   LDL: 104 (06/01/2008)   TG: 74.0 (06/01/2008)  Problem # 4:  HYPERGLYCEMIA (ICD-790.29) Assessment: Unchanged  Orders: Venipuncture (57846) TLB-BMP (Basic Metabolic Panel-BMET) (80048-METABOL) TLB-CBC Platelet - w/Differential (85025-CBCD) TLB-Hepatic/Liver Function Pnl (80076-HEPATIC) TLB-TSH (Thyroid Stimulating Hormone) (84443-TSH) TLB-A1C / Hgb A1C (Glycohemoglobin)  (83036-A1C) T-Vitamin D (25-Hydroxy) (96295-28413)  Problem # 5:  HYPOKALEMIA (ICD-276.8) Assessment: Improved  Orders: Venipuncture (24401) TLB-BMP (Basic Metabolic Panel-BMET) (80048-METABOL) TLB-CBC Platelet - w/Differential (85025-CBCD) TLB-Hepatic/Liver Function Pnl (80076-HEPATIC) TLB-TSH (Thyroid Stimulating Hormone) (84443-TSH) TLB-A1C / Hgb A1C (Glycohemoglobin) (83036-A1C) T-Vitamin D (25-Hydroxy) (02725-36644)  Complete Medication List: 1)  Multivitamin  2)  Vitamin D 1000 Unit Tabs (Cholecalciferol) .... One by mouth bid 3)  Asa 81mg   4)  Hydrochlorothiazide 25 Mg Tabs (Hydrochlorothiazide) .... Take 1 tablet by mouth once a day 5)  Naproxen 500 Mg Tabs (Naproxen) .Marland Kitchen.. 1 tab by mouth two times a day x7-10 days and then as needed.  take w/ food. 6)  Cymbalta 60 Mg Cpep (Duloxetine hcl) .... One by mouth once daily for depression  Other Orders: Admin 1st Vaccine (03474) Flu Vaccine 52yrs + (25956)  Patient Instructions: 1)  Please schedule a follow-up appointment in 2 months. 2)  It is important that you exercise regularly at least 20 minutes 5 times a week. If you develop chest pain, have severe difficulty breathing, or feel very tired , stop exercising immediately and seek medical attention. 3)  You need to lose weight. Consider a lower calorie diet and regular exercise.  4)  Check your  Blood Pressure regularly. If it is above 130/80: you should make an appointment. Prescriptions: HYDROCHLOROTHIAZIDE 25 MG TABS (HYDROCHLOROTHIAZIDE) Take 1 tablet by mouth once a day  #30 x 11   Entered and Authorized by:   Etta Grandchild MD   Signed by:   Etta Grandchild MD on 10/18/2009   Method used:   [facility]  [address]       [address]       [place], [place]  [address]       Ph: [phone] or [phone]       Fax: [phone]   RxID:   [id number] VITAMIN D 1000 UNIT TABS (CHOLECALCIFEROL) One by mouth BID   #60 x 11   Entered and Authorized by:   Etta Grandchild MD   Signed by:   Etta Grandchild MD on 10/18/2009   Method used:   [facility]  [address] [id number]       [address]       [place], [place]  [address]       Ph: [phone] or [phone]       Fax: [phone]   RxID:   [id number] CYMBALTA 60 MG CPEP (DULOXETINE HCL) One by mouth once daily for depression  #56 x 0   Entered and Authorized by:   Etta Grandchild MD   Signed by:   Etta Grandchild MD on 10/18/2009   Method used:   Samples Given   RxID:   [id number]   Flu Vaccine Consent Questions     Do you have a history of severe allergic reactions to this vaccine? no    Any prior history of allergic reactions to egg and/or gelatin? no    Do you have a sensitivity to the preservative Thimersol? no    Do you have a past history of Guillan-Barre Syndrome? no    Do you currently have an acute febrile illness? no    Have you ever had a severe reaction to latex? no    Vaccine information given and explained to patient? yes    Are you currently pregnant? no    Lot Number:[id number]   Exp Date:07/06/2010   Site Given  Left Deltoid IMlu

## 2010-02-07 NOTE — Assessment & Plan Note (Signed)
Summary: NEW PT [employer]--$50-PKG--STC   Vital Signs:  Patient profile:   55 year old female Menstrual status:  postmenopausal Height:      63 inches Weight:      295.50 pounds BMI:     52.53 O2 Sat:      96 % Temp:     98.2 degrees F oral Pulse rate:   96 / minute BP sitting:   138 / 80  Vitals Entered By: Beola Cord, CMA  Nutrition Counseling: Patient's BMI is greater than 25 and therefore counseled on weight management options.  Primary Care Provider:  Etta Grandchild MD   History of Present Illness: This is a new patient to me. Her chief complaint is weight gain. She describes gaining about 20-30 pounds per year for the last 3 years. She has severe fatigue. In the fall of 2009 she was seen at the hospital for chest pain. She was evaluated by cardiologist ( Dr. Reyes Ivan)  She did a  treadmill test that was normal but she was placed on a beta blocker, metoprolol. She has since been having severe fatigue. She does not endorse any DOE, shortness of breath, chest pain or palpitations recently. She is seeing a podiatrist for right ankle pain and swelling and is concerned she may have gout. The podiatrist told her she had plantar fasciitis.  Dyspepsia History:      She has no alarm features of dyspepsia including no history of melena, hematochezia, dysphagia, persistent vomiting, or involuntary weight loss > 5%.  There is a prior history of GERD.  The patient does not have a prior history of documented ulcer disease.  The dominant symptom is not heartburn or acid reflux.  An H-2 blocker medication is not currently being taken.     Current Medications (verified): 1)  Hydrochlorothiazide 25 Mg  Tabs (Hydrochlorothiazide) 2)  Potassium 3)  Toprol Xl .... Daily 4)  Multivitamin  Allergies (verified): 1)  ! * Lisinopril 2)  ! * Diltiazem  Past History:  Past Medical History: HYPERGLYCEMIA (ICD-790.29) PANCREATITIS, HX OF (ICD-V12.70) G E R D (ICD-530.81) Anxiety  Disorder Hypertension Hx. of Tracheostomy Hx. Resp. Failure Chronic Cough ERCP(08/08/04) Hypokalemia  Past Surgical History: Reviewed history from 04/01/2007 and no changes required. 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  The patient denies anorexia, fever, weight loss, decreased hearing, chest pain, syncope, dyspnea on exertion, peripheral edema, prolonged cough, headaches, hemoptysis, abdominal pain, melena, hematochezia, severe indigestion/heartburn, suspicious skin lesions, difficulty walking, enlarged lymph nodes, angioedema, and breast masses.    Physical Exam  General:  alert, well-developed, well-nourished, well-hydrated, appropriate dress, cooperative to examination, and good hygiene.   Head:  normocephalic and atraumatic.   Eyes:  No corneal or conjunctival inflammation noted. EOMI. Perrla. Funduscopic exam benign, without hemorrhages, exudates or papilledema. Vision grossly normal. Mouth:  Oral mucosa and oropharynx without lesions or exudates.  Teeth in good repair. Neck:  tracheostomy scar noted.  supple, full ROM, no masses, no thyromegaly, no thyroid nodules or tenderness, no JVD, normal carotid upstroke, no carotid bruits, no cervical lymphadenopathy, and tracheostomy noted.   Lungs:  Normal respiratory  effort, chest expands symmetrically. Lungs are clear to auscultation, no crackles or wheezes. Heart:  Normal rate and regular rhythm. S1 and S2 normal without gallop, murmur, click, rub or other extra sounds. Abdomen:  soft,  non-tender, normal bowel sounds, no distention, no masses, no guarding, no rigidity, no rebound tenderness, no hepatomegaly, no splenomegaly, and abdominal scar(s).   Msk:  there is mild diffuse swelling around the right ankle. Pulses:  R and L carotid,radial,femoral,dorsalis pedis and posterior tibial pulses are full and equal bilaterally Extremities:  trace left pedal edema and trace right pedal edema.   Neurologic:  No cranial nerve deficits noted. Station and gait are normal. Plantar reflexes are down-going bilaterally. DTRs are symmetrical throughout. Sensory, motor and coordinative functions appear intact. Skin:  Intact without suspicious lesions or rashes Cervical Nodes:  No lymphadenopathy noted Psych:  Oriented X3, memory intact for recent and remote, good eye contact, not anxious appearing, not agitated, flat affect, and subdued.     Impression & Recommendations:  Problem # 1:  ABNORMAL WEIGHT GAIN (ICD-783.1) labs ordered to rule out secondary causes. Will refer her to nutrition and consider for lap band. we'll need to talk to her cardiologist about whether or not she should stay on the beta blocker  Problem # 2:  HYPERGLYCEMIA (ICD-790.29) check her hemoglobin A1c  Problem # 3:  G E R D (ICD-530.81)  Problem # 4:  EFFUSION OF ANKLE AND FOOT JOINT (ICD-719.07)  Check her uric acid level, continue followup with podiatry  Problem # 5:  HYPOKALEMIA (ICD-276.8) Assessment: Unchanged we'll check renal function, potassium and magnesium levels  Problem # 6:  HYPERTENSION (ICD-401.9) Assessment: Unchanged  The following medications were removed from the medication list:    Dilt-cd 120 Mg Cp24 (Diltiazem hcl coated beads) Her updated medication list for this problem includes:    Hydrochlorothiazide 25 Mg Tabs (Hydrochlorothiazide)  Complete Medication List: 1)  Hydrochlorothiazide 25 Mg Tabs (Hydrochlorothiazide) 2)  Potassium  3)  Toprol Xl  .... Daily 4)  Multivitamin    Other Orders: TLB-Lipid Panel (80061-LIPID) TLB-IBC Pnl (Iron/FE;Transferrin) (83550-IBC) TLB-CBC Platelet - w/Differential (85025-CBCD) TLB-BMP (Basic Metabolic Panel-BMET) (80048-METABOL) TLB-Hepatic/Liver Function Pnl (80076-HEPATIC) TLB-TSH (Thyroid Stimulating Hormone) (84443-TSH) TLB-Magnesium (Mg) (83735-MG) TLB-A1C / Hgb A1C (Glycohemoglobin) (83036-A1C) TLB-Udip w/ Micro (81001-URINE) T-Vitamin D (25-Hydroxy) (13244-01027) TLB-Uric Acid, Blood (84550-URIC)  Patient Instructions: 1)  Please schedule a follow-up appointment in 2 weeks. 2)  It is important that you exercise regularly at least 20 minutes 5 times a week. If you develop chest pain, have severe difficulty breathing, or feel very tired , stop exercising immediately and seek medical attention. 3)  You need to lose weight. Consider a lower calorie diet and regular exercise.

## 2010-02-07 NOTE — Assessment & Plan Note (Signed)
Summary: LT FOOT PAIN/RCD   Vital Signs:  Patient profile:   55 year old female Menstrual status:  postmenopausal Weight:      251 pounds Temp:     97.4 degrees F Pulse rate:   78 / minute BP sitting:   130 / 84 Cuff size:   large  Vitals Entered By: Lamar Sprinkles, CMA (June 09, 2009 11:27 AM) CC: Completed abx for infection in her foot but still c/o swelling and pain. No fever.    History of Present Illness: 55 yo woman here today w/ L foot pain and swelling.  saw Dr Felicity Coyer 5/20 and was tx'd w/ bactrim.  pain is no longer at base of the leg, has moved into dorsum of foot.  painful to wear shoes.  no erythema, not warm.  no fevers.  no injury to area.  started 2 weeks ago after wearing tennis shoes to work, initially thought her shoes were too tight.  has been taking Advil 400mg  two times a day- no relief.  Allergies (verified): 1)  ! * Lisinopril 2)  ! * Diltiazem  Review of Systems      See HPI  Physical Exam  General:  alert, well-developed, well-nourished, and cooperative to examination.   Msk:  left ankle - FROM without tenderness or pain + TTP on dorsum of L foot Pulses:  +2 DP/PT Extremities:  trace edema on R, + 1 edema on dorsum of L foot no erythema or warmth    Impression & Recommendations:  Problem # 1:  FOOT, PAIN (ICD-719.47) Assessment New no evidence of recurrent cellultis or infxn.  mild swelling and pain on dorsum of L foot but pt can't recall injury.  ? stress fx vs strain/sprain.  check Xray on Monday.  increase NSAID dose and frequency.  reviewed supportive care and red flags that should prompt return.  Pt expresses understanding and is in agreement w/ this plan. Orders: T-Foot Left Min 3 Views (73630TC)  Complete Medication List: 1)  Multivitamin  2)  Vitamin D 1000 Unit Tabs (Cholecalciferol) .... One by mouth bid 3)  Asa 81mg   4)  Cymbalta 30 Mg Cpep (Duloxetine hcl) .... Once daily 5)  Hydrochlorothiazide 25 Mg Tabs (Hydrochlorothiazide)  .... Take 1 tablet by mouth once a day 6)  Naproxen 500 Mg Tabs (Naproxen) .Marland Kitchen.. 1 tab by mouth two times a day x7-10 days and then as needed.  take w/ food.  Patient Instructions: 1)  This does not appear to be infection so no antibiotics at this time 2)  Ice the area for 20 minutes at least 3x/day 3)  Elevate as much as possible 4)  Take the Naproxen as directed for inflammation- do not add any Advil, ibuprofen, Aleve, etc but you CAN add Tylenol 5)  Get your Xray on Monday 6)  Try and wear comfortable but supportive shoes 7)  If no improvement but midweek- please call 8)  Hang in there! Prescriptions: NAPROXEN 500 MG TABS (NAPROXEN) 1 tab by mouth two times a day x7-10 days and then as needed.  take w/ food.  #60 x 0   Entered and Authorized by:   Neena Rhymes MD   Signed by:   Neena Rhymes MD on 06/09/2009   Method used:   Electronically to        [facility]  [id number] (retail)       [address]       [facility]  [place], [place]  [address]       Ph: [phone] or [phone]       Fax: [phone]   RxID:   [id number]

## 2010-02-07 NOTE — Letter (Signed)
Summary: Generic Letter  [facility]  [address] [place], [place] [address]   Phone: [phone]  Fax: [phone]          12/04/2008  [name] [address] [place], [place]  [address]  Dear [name],  This letter serves to document that you are a good candidate for the adjustable lap band surgery for treatment of obesity.     Sincerely,   [name] MD

## 2010-02-07 NOTE — Assessment & Plan Note (Signed)
Summary: elev bp/#/cd   Vital Signs:  Patient profile:   55 year old female Menstrual status:  postmenopausal Height:      63 inches Weight:      284 pounds BMI:     50.49 O2 Sat:      95 % on Room air Temp:     98.4 degrees F oral Pulse rate:   81 / minute Pulse rhythm:   regular BP sitting:   152 / 90  (left arm) Cuff size:   large  Vitals Entered By: Rock Nephew CMA (October 25, 2008 1:19 PM)  Nutrition Counseling: Patient's BMI is greater than 25 and therefore counseled on weight management options.  O2 Flow:  Room air  Primary Care Thoren Hosang:  Etta Grandchild MD   History of Present Illness: She returns c/o BP up to 162/101 and 173/104 over the last few days. No symptoms though.  Hypertension History:      She complains of side effects from treatment, but denies headache, chest pain, palpitations, dyspnea with exertion, peripheral edema, visual symptoms, neurologic problems, and syncope.  She notes the following problems with antihypertensive medication side effects: low potassium.        Positive major cardiovascular risk factors include hypertension.  Negative major cardiovascular risk factors include female age less than 19 years old, no history of diabetes or hyperlipidemia, negative family history for ischemic heart disease, and non-tobacco-user status.        Further assessment for target organ damage reveals no history of ASHD, cardiac end-organ damage (CHF/LVH), stroke/TIA, peripheral vascular disease, renal insufficiency, or hypertensive retinopathy.     Current Medications (verified): 1)  Multivitamin 2)  Vitamin D 1000 Unit Tabs (Cholecalciferol) .... One By Mouth Bid 3)  Hydrochlorothiazide 25 Mg Tabs (Hydrochlorothiazide) .Marland Kitchen.. 1 Once Daily 4)  Asa 81mg  5)  Cymbalta 30 Mg Cpep (Duloxetine Hcl) .... Once Daily 6)  K-Tabs 10 Meq Cr-Tabs (Potassium Chloride) .... 2 By Mouth Once Daily  Allergies (verified): 1)  ! * Lisinopril 2)  ! * Diltiazem  Past  History:  Past Medical History: Reviewed history from 07/13/2008 and no changes required. HYPERGLYCEMIA (ICD-790.29) PANCREATITIS, HX OF (ICD-V12.70) G E R D (ICD-530.81) Anxiety Disorder Hypertension Hx. of Tracheostomy Hx. Resp. Failure Chronic Cough ERCP(08/08/04) Hypokalemia Depression  Past Surgical History: Reviewed history from 04/01/2007 and no changes required. posr ERCP Appendectomy Breast Surgery: reduction Cholecystectomy Total Abdominal Hysterectomy Hammer Toe Surgery Tubal Ligation Intestinal repair(twisted intestine)1999  Family History: Reviewed history from 06/01/2008 and no changes required. Family History Diabetes 1st degree relative Family History Hypertension  Social History: Reviewed history from 06/01/2008 and no changes required. 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   Endo:  Denies cold intolerance, excessive hunger, excessive thirst, excessive urination, heat intolerance, polyuria, and weight change.  Physical Exam  General:  alert, well-developed, well-nourished, well-hydrated, and overweight-appearing.   Mouth:  Oral mucosa and oropharynx without lesions or exudates.  Teeth in good repair. Neck:  No deformities, masses, or tenderness noted. Lungs:  Normal respiratory effort, chest expands symmetrically. Lungs are clear to auscultation, no crackles or wheezes. Heart:  Normal rate and regular rhythm. S1 and S2 normal without gallop, murmur, click, rub or other extra sounds. Abdomen:  Bowel sounds positive,abdomen soft and non-tender without masses, organomegaly or hernias noted. Msk:  normal ROM, no joint tenderness, no joint  swelling, and no joint warmth.   Pulses:  R and L carotid,radial,femoral,dorsalis pedis and posterior tibial pulses are full and equal  bilaterally Extremities:  trace left pedal edema and trace right pedal edema.   Neurologic:  No cranial nerve deficits noted. Station and gait are normal. Plantar reflexes are down-going bilaterally. DTRs are symmetrical throughout. Sensory, motor and coordinative functions appear intact. Skin:  Intact without suspicious lesions or rashes Cervical Nodes:  No lymphadenopathy noted Psych:  Oriented X3, memory intact for recent and remote, normally interactive, good eye contact, not anxious appearing, not depressed appearing, not agitated, not suicidal, and subdued.     Impression & Recommendations:  Problem # 1:  HYPERTENSION (ICD-401.9) Assessment Deteriorated  will lower the dose of HCTZ since she is still mildly hypokalemic The following medications were removed from the medication list:    Hydrochlorothiazide 25 Mg Tabs (Hydrochlorothiazide) .Marland Kitchen... 1 once daily Her updated medication list for this problem includes:    Tribenzor 20-5-12.5 Mg Tabs (Olmesartan-amlodipine-hctz) ..... Once daily for high blood pressure  BP today: 152/90 Prior BP: 132/90 (10/17/2008)  Prior 10 Yr Risk Heart Disease: 11 % (08/18/2008)  Labs Reviewed: K+: 3.6 (10/17/2008) Creat: : 0.7 (10/17/2008)   Chol: 165 (06/01/2008)   HDL: 46.60 (06/01/2008)   LDL: 104 (06/01/2008)   TG: 74.0 (06/01/2008)  Problem # 2:  HYPOKALEMIA (ICD-276.8) Assessment: Unchanged  Complete Medication List: 1)  Multivitamin  2)  Vitamin D 1000 Unit Tabs (Cholecalciferol) .... One by mouth bid 3)  Asa 81mg   4)  Cymbalta 30 Mg Cpep (Duloxetine hcl) .... Once daily 5)  K-tabs 10 Meq Cr-tabs (Potassium chloride) .... 2 by mouth once daily 6)  Tribenzor 20-5-12.5 Mg Tabs (Olmesartan-amlodipine-hctz) .... Once daily for high blood pressure  Hypertension Assessment/Plan:      The patient's hypertensive risk group is category A: No risk factors and no target organ damage.  Her calculated 10 year risk of coronary heart disease is 11 %.   Today's blood pressure is 152/90.  Her blood pressure goal is < 140/90.  Patient Instructions: 1)  Please schedule a follow-up appointment in 1 month. 2)  It is important that you exercise regularly at least 20 minutes 5 times a week. If you develop chest pain, have severe difficulty breathing, or feel very tired , stop exercising immediately and seek medical attention. 3)  You need to lose weight. Consider a lower calorie diet and regular exercise.  4)  Check your Blood Pressure regularly. If it is above 140/90: you should make an appointment. Prescriptions: TRIBENZOR 20-5-12.5 MG TABS (OLMESARTAN-AMLODIPINE-HCTZ) once daily for high blood pressure  #42 x 0   Entered and Authorized by:   Etta Grandchild MD   Signed by:   Etta Grandchild MD on 10/25/2008   Method used:   Samples Given   RxID:   [id number]     Tetanus Vaccine (to be given today)

## 2010-02-07 NOTE — Letter (Signed)
Summary: Results Follow-up Letter  [facility]  [address] [place], [place] [address]   Phone: [phone]  Fax: [phone]    10/19/2009  [address] [place], [place]  [address]  Dear Ms. Garson,   The following are the results of your recent test(s):  Test     Result     Vitamin D level   low Liver/kidney   normal Thyroid     normal Blood sugars   normal CBC       normal _______________________________________    _________________________________________________________  Please call for an appointment as directed _________________________________________________________ _________________________________________________________ _________________________________________________________  Sincerely,  [name] MD [facility]

## 2010-02-07 NOTE — Letter (Signed)
Summary: [facility]   Imported By: Lester [place] 12/25/2008 08:05:00  _____________________________________________________________________  External Attachment:    Type:   Image     Comment:   External Document

## 2010-02-07 NOTE — Assessment & Plan Note (Signed)
Summary: 4 mos f/u / #/ cd   Vital Signs:  Patient profile:   55 year old female Menstrual status:  postmenopausal Height:      63 inches Weight:      252 pounds O2 Sat:      98 % Temp:     98.0 degrees F oral Pulse rate:   90 / minute Pulse rhythm:   regular Resp:     16 per minute BP sitting:   118 / 80  (right arm)  Hypertension History:      She complains of peripheral edema and side effects from treatment, but denies headache, chest pain, palpitations, dyspnea with exertion, orthopnea, PND, visual symptoms, neurologic problems, and syncope.  She notes the following problems with antihypertensive medication side effects: swelling that is worse with Tribenzor.        Positive major cardiovascular risk factors include hypertension.  Negative major cardiovascular risk factors include female age less than 40 years old, no history of diabetes or hyperlipidemia, negative family history for ischemic heart disease, and non-tobacco-user status.        Further assessment for target organ damage reveals no history of ASHD, cardiac end-organ damage (CHF/LVH), stroke/TIA, peripheral vascular disease, renal insufficiency, or hypertensive retinopathy.     Allergies: 1)  ! * Lisinopril 2)  ! * Diltiazem  Past History:  Past Medical History: Last updated: 05/25/2009 HYPERGLYCEMIA  PANCREATITIS, HX OF  G E R D Anxiety Disorder Hypertension Hx. of Tracheostomy Hx. Resp. 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 Family History Diabetes 1st degree relative Family History Hypertension  Social History: Reviewed history from 06/01/2008 and no changes required. 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    Physical Exam  General:  alert, well-developed, well-nourished, well-hydrated, appropriate dress, normal appearance, healthy-appearing, cooperative to examination, good hygiene, and overweight-appearing.   Head:  normocephalic and atraumatic.   Eyes:  vision grossly intact, pupils equal, pupils round, and pupils reactive to light.   Mouth:  Oral mucosa and oropharynx without lesions or exudates.  Teeth in good repair. Neck:  No deformities, masses, or tenderness noted. Lungs:  normal respiratory effort, no intercostal retractions, no accessory muscle use, normal breath sounds, no dullness, no fremitus, no crackles, and no wheezes.   Heart:  normal rate, regular rhythm, no murmur, no gallop, no rub, no JVD, and no HJR.   Abdomen:  soft, non-tender, normal bowel sounds, no distention, no masses, no hepatomegaly, and no splenomegaly.  LUQ shows the port sight is healing well. Msk:  normal ROM, no joint tenderness, no joint swelling, no joint warmth, no redness over joints, no joint deformities, no joint instability, no crepitation, and no muscle atrophy.   Pulses:  R and L carotid,radial,femoral,dorsalis pedis and posterior tibial pulses are full and equal bilaterally Extremities:  1+ left pedal edema and 1+ right  pedal edema.   Neurologic:  No cranial nerve deficits noted. Station and gait are  normal. Plantar reflexes are down-going bilaterally. DTRs are symmetrical throughout. Sensory, motor and coordinative functions appear intact. Skin:  Intact without suspicious lesions or rashes Cervical Nodes:  No lymphadenopathy noted Psych:  Cognition and judgment appear intact. Alert and cooperative with normal attention span and concentration. No apparent delusions, illusions, hallucinations   Impression & Recommendations:  Problem # 1:  HYPERTENSION (ICD-401.9) Assessment Unchanged  Her updated medication list for this problem includes:    Hydrochlorothiazide 25 Mg Tabs (Hydrochlorothiazide) .Marland Kitchen... Take 1 tablet by mouth once a day  Complete Medication List: 1)  Multivitamin  2)  Vitamin D 1000 Unit Tabs (Cholecalciferol) .... One by mouth bid 3)  Asa 81mg   4)  Cymbalta 30 Mg Cpep (Duloxetine hcl) .... Once daily 5)  Hydrochlorothiazide 25 Mg Tabs (Hydrochlorothiazide) .... Take 1 tablet by mouth once a day 6)  Naproxen 500 Mg Tabs (Naproxen) .Marland Kitchen.. 1 tab by mouth two times a day x7-10 days and then as needed.  take w/ food.  Hypertension Assessment/Plan:      The patient's hypertensive risk group is category A: No risk factors and no target organ damage.  Her calculated 10 year risk of coronary heart disease is 8 %.  Today's blood pressure is 118/80.  Her blood pressure goal is < 140/90.  Patient Instructions: 1)  Please schedule a follow-up appointment in 2 months. 2)  Limit your Sodium (Salt) to less than 4 grams a day (slightly less than 1 teaspoon) to prevent fluid retention, swelling, or worsening or symptoms. 3)  It is important that you exercise regularly at least 20 minutes 5 times a week. If you develop chest pain, have severe difficulty breathing, or feel very tired , stop exercising immediately and seek medical attention. 4)  You need to lose weight. Consider a lower calorie diet and regular exercise.  5)  Check your Blood Pressure regularly. If it is above 140/90: you  should make an appointment.

## 2010-02-07 NOTE — Procedures (Signed)
Summary: [facility]astroenterology ERCP  Gastroenterology ERCP   Imported By: Lowry Ram CMA 04/02/2007 10:33:08  _____________________________________________________________________  External Attachment:    Type:   Image     Comment:   External Document

## 2010-02-07 NOTE — Assessment & Plan Note (Signed)
Summary: 2 WK ROV Natale Milch $50   Vital Signs:  Patient profile:   55 year old female Menstrual status:  postmenopausal Height:      63 inches Weight:      290 pounds BMI:     51.56 O2 Sat:      96 % on Room air Temp:     98.4 degrees F oral Pulse rate:   76 / minute Pulse rhythm:   regular BP sitting:   124 / 82  (left arm) Cuff size:   large  Vitals Entered By: Rock Nephew CMA (June 15, 2008 3:39 PM)  Nutrition Counseling: Patient's BMI is greater than 25 and therefore counseled on weight management options.  O2 Flow:  Room air  Primary Care Provider:  Etta Grandchild MD   History of Present Illness: She c/o fatigue and "feels like a vampire".  Hypertension History:      She complains of peripheral edema, but denies headache, chest pain, palpitations, dyspnea with exertion, orthopnea, PND, visual symptoms, neurologic problems, syncope, and side effects from treatment.  She notes the following problems with antihypertensive medication side effects: fatigue, weight gain, low potassium.        Positive major cardiovascular risk factors include hypertension.  Negative major cardiovascular risk factors include female age less than 65 years old, no history of diabetes or hyperlipidemia, negative family history for ischemic heart disease, and non-tobacco-user status.        Further assessment for target organ damage reveals no history of ASHD, cardiac end-organ damage (CHF/LVH), stroke/TIA, peripheral vascular disease, renal insufficiency, or hypertensive retinopathy.     Clinical Review Panels:  Lipid Management   Cholesterol:  165 (06/01/2008)   LDL (bad choesterol):  104 (06/01/2008)   HDL (good cholesterol):  46.60 (06/01/2008)  Diabetes Management   HgBA1C:  5.9 (06/01/2008)   Creatinine:  0.7 (06/01/2008)  CBC   WBC:  8.6 (06/01/2008)   RBC:  4.25 (06/01/2008)   Hgb:  12.6 (06/01/2008)   Hct:  37.1 (06/01/2008)   Platelets:  261.0 (06/01/2008)   MCV  87.4  (06/01/2008)   MCHC  34.1 (06/01/2008)   RDW  13.9 (06/01/2008)   PMN:  71.8 (06/01/2008)   Lymphs:  23.5 (06/01/2008)   Monos:  2.9 (06/01/2008)   Eosinophils:  1.6 (06/01/2008)   Basophil:  0.2 (06/01/2008)  Complete Metabolic Panel   Glucose:  96 (06/01/2008)   Sodium:  141 (06/01/2008)   Potassium:  3.1 (06/01/2008)   Chloride:  100 (06/01/2008)   CO2:  34 (06/01/2008)   BUN:  12 (06/01/2008)   Creatinine:  0.7 (06/01/2008)   Albumin:  3.8 (06/01/2008)   Total Protein:  7.5 (06/01/2008)   Calcium:  9.0 (06/01/2008)   Total Bili:  0.7 (06/01/2008)   Alk Phos:  90 (06/01/2008)   SGPT (ALT):  23 (06/01/2008)   SGOT (AST):  21 (06/01/2008)   Current Medications (verified): 1)  Hydrochlorothiazide 25 Mg  Tabs (Hydrochlorothiazide) 2)  Potassium 3)  Toprol Xl .... Daily 4)  Multivitamin  Allergies (verified): 1)  ! * Lisinopril 2)  ! * Diltiazem  Past History:  Past Medical History: Reviewed history from 06/01/2008 and no changes required. HYPERGLYCEMIA (ICD-790.29) PANCREATITIS, HX OF (ICD-V12.70) G E R D (ICD-530.81) Anxiety Disorder Hypertension Hx. of Tracheostomy Hx. Resp. Failure Chronic Cough ERCP(08/08/04) Hypokalemia  Past Surgical History: Reviewed history from 04/01/2007 and no changes required. posr ERCP Appendectomy Breast Surgery: reduction Cholecystectomy Total Abdominal Hysterectomy Hammer Toe Surgery Tubal  Ligation Intestinal repair(twisted intestine)1999  Family History: Reviewed history from 06/01/2008 and no changes required. Family History Diabetes 1st degree relative Family History Hypertension  Social History: Reviewed history from 06/01/2008 and no changes required. Occupation: Water engineer Married Never Smoked Alcohol use-no Drug use-no Regular exercise-no  Review of Systems       The patient complains of weight gain.  The patient denies abdominal pain, hematuria, and muscle weakness.    Physical  Exam  General:  alert, well-developed, well-nourished, well-hydrated, cooperative to examination, good hygiene, and overweight-appearing.   Head:  normocephalic and atraumatic.   Mouth:  Oral mucosa and oropharynx without lesions or exudates.  Teeth in good repair. Neck:  supple, full ROM, no masses, no thyromegaly, no cervical lymphadenopathy, and no neck tenderness.   Lungs:  Normal respiratory effort, chest expands symmetrically. Lungs are clear to auscultation, no crackles or wheezes. Heart:  Normal rate and regular rhythm. S1 and S2 normal without gallop, murmur, click, rub or other extra sounds. Abdomen:  soft, non-tender, normal bowel sounds, no distention, no masses, no guarding, no rigidity, no rebound tenderness, no hepatomegaly, no splenomegaly, and abdominal scar(s).   Extremities:  1+ left pedal edema and 1+ right pedal edema.   Neurologic:  No cranial nerve deficits noted. Station and gait are normal. Plantar reflexes are down-going bilaterally. DTRs are symmetrical throughout. Sensory, motor and coordinative functions appear intact. Skin:  Intact without suspicious lesions or rashes Cervical Nodes:  No lymphadenopathy noted Psych:  Oriented X3, memory intact for recent and remote, good eye contact, not anxious appearing, not agitated, flat affect, and subdued.     Impression & Recommendations:  Problem # 1:  HYPERTENSION (ICD-401.9) Assessment Improved taper, stop and reevaluate meds due to complications The following medications were removed from the medication list:    Hydrochlorothiazide 25 Mg Tabs (Hydrochlorothiazide) Her updated medication list for this problem includes:    Toprol Xl 25 Mg Xr24h-tab (Metoprolol succinate) ..... Once daily  Problem # 2:  HYPOKALEMIA (ICD-276.8) Assessment: Unchanged  Orders: TLB-BMP (Basic Metabolic Panel-BMET) (80048-METABOL) TLB-Magnesium (Mg) (83735-MG)  Problem # 3:  HYPERGLYCEMIA (ICD-790.29) Assessment: Improved  Problem  # 4:  VITAMIN D DEFICIENCY (ICD-268.9) Assessment: New start vitamin d  Complete Medication List: 1)  Potassium  2)  Toprol Xl 25 Mg Xr24h-tab (Metoprolol succinate) .... Once daily 3)  Multivitamin  4)  Vitamin D 1000 Unit Tabs (Cholecalciferol) .... One by mouth bid  Hypertension Assessment/Plan:      The patient's hypertensive risk group is category A: No risk factors and no target organ damage.  Her calculated 10 year risk of coronary heart disease is 8 %.  Today's blood pressure is 124/82.  Her blood pressure goal is < 140/90.  Patient Instructions: 1)  Please schedule a follow-up appointment in 3 months. 2)  It is important that you exercise regularly at least 20 minutes 5 times a week. If you develop chest pain, have severe difficulty breathing, or feel very tired , stop exercising immediately and seek medical attention. 3)  It is not healthy  for men to drink more than 2-3 drinks per day or for women to drink more than 1-2 drinks per day. 4)  Check your Blood Pressure regularly. If it is above 140/90: you should make an appointment. Prescriptions: VITAMIN D 1000 UNIT TABS (CHOLECALCIFEROL) One by mouth BID  #60 x 5   Entered and Authorized by:   Etta Grandchild MD   Signed by:   Maisie Fus  Karsten Ro MD on 06/15/2008   Method used:   Print then Give to Patient   RxID:   [id number] TOPROL XL 25 MG XR24H-TAB (METOPROLOL SUCCINATE) once daily  #30 x 2   Entered and Authorized by:   Etta Grandchild MD   Signed by:   Etta Grandchild MD on 06/15/2008   Method used:   Print then Give to Patient   RxID:   [id number]

## 2010-02-07 NOTE — Progress Notes (Signed)
Summary: RESULTS  Phone Note Call from Patient Call back at Work Phone [phone]   Summary of Call: Patient is requesting results of xray.  Initial call taken by: Lamar Sprinkles, CMA,  June 15, 2009 9:14 AM  Follow-up for Phone Call        heel spur, no big deal Follow-up by: Etta Grandchild MD,  June 15, 2009 9:42 AM  Additional Follow-up for Phone Call Additional follow up Details #1::        called report Additional Follow-up by: Raechel Ache, RN,  June 15, 2009 11:50 AM

## 2010-02-07 NOTE — Assessment & Plan Note (Signed)
Summary: 2 mo rov Natale Milch $50   Vital Signs:  Patient profile:   55 year old female Menstrual status:  postmenopausal Height:      63 inches Weight:      287 pounds BMI:     51.02 O2 Sat:      96 % on Room air Temp:     98.1 degrees F oral Pulse rate:   91 / minute Pulse rhythm:   regular Resp:     16 per minute BP sitting:   132 / 90  (left arm) Cuff size:   large  Vitals Entered By: Rock Nephew CMA (October 17, 2008 8:17 AM)  O2 Flow:  Room air CC: follow-up visit, Hypertension Management Is Patient Diabetic? No   Primary Care Nyellie Yetter:  Etta Grandchild MD  CC:  follow-up visit and Hypertension Management.  History of Present Illness: She returns for f/up with no complaints. Her depression is getting better.  Hypertension History:      She denies headache, chest pain, palpitations, dyspnea with exertion, orthopnea, PND, visual symptoms, neurologic problems, syncope, and side effects from treatment.  She notes no problems with any antihypertensive medication side effects.        Positive major cardiovascular risk factors include hypertension.  Negative major cardiovascular risk factors include female age less than 42 years old, no history of diabetes or hyperlipidemia, negative family history for ischemic heart disease, and non-tobacco-user status.        Further assessment for target organ damage reveals no history of ASHD, cardiac end-organ damage (CHF/LVH), stroke/TIA, peripheral vascular disease, renal insufficiency, or hypertensive retinopathy.     Current Medications (verified): 1)  Multivitamin 2)  Vitamin D 1000 Unit Tabs (Cholecalciferol) .... One By Mouth Bid 3)  Hydrochlorothiazide 25 Mg Tabs (Hydrochlorothiazide) .Marland Kitchen.. 1 Once Daily 4)  Asa 81mg  5)  Cymbalta 30 Mg Cpep (Duloxetine Hcl) .... Once Daily 6)  Klor-Con M20 20 Meq Cr-Tabs (Potassium Chloride Crys Cr) .... Once Daily  Allergies (verified): 1)  ! * Lisinopril 2)  ! * Diltiazem  Past  History:  Past Medical History: Reviewed history from 07/13/2008 and no changes required. HYPERGLYCEMIA (ICD-790.29) PANCREATITIS, HX OF (ICD-V12.70) G E R D (ICD-530.81) Anxiety Disorder Hypertension Hx. of Tracheostomy Hx. Resp. Failure Chronic Cough ERCP(08/08/04) Hypokalemia Depression  Past Surgical History: Reviewed history from 04/01/2007 and no changes required. posr ERCP Appendectomy Breast Surgery: reduction Cholecystectomy Total Abdominal Hysterectomy Hammer Toe Surgery Tubal Ligation Intestinal repair(twisted intestine)1999  Family History: Reviewed history from 06/01/2008 and no changes required. Family History Diabetes 1st degree relative Family History Hypertension  Social History: Reviewed history from 06/01/2008 and no changes required. 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   Psych:  Complains of depression; denies anxiety, easily angered, easily tearful, irritability, mental problems, panic attacks, sense of great danger, suicidal thoughts/plans, and thoughts /plans of harming others.  Physical Exam  General:  Well-developed,well-nourished,in no acute distress; alert,appropriate and cooperative throughout examination Mouth:  Oral mucosa and oropharynx without lesions or exudates.  Teeth in good repair. Neck:  No deformities, masses, or tenderness noted. Lungs:  Normal respiratory effort, chest expands symmetrically. Lungs are clear to auscultation, no crackles or wheezes. Heart:  Normal rate and regular rhythm. S1 and S2 normal without gallop, murmur, click, rub or other extra sounds. Abdomen:  Bowel sounds positive,abdomen soft and non-tender without masses, organomegaly or hernias noted. Msk:  normal  ROM, no joint tenderness, no joint swelling, and no joint warmth.   Pulses:  R and L carotid,radial,femoral,dorsalis pedis and posterior  tibial pulses are full and equal bilaterally Extremities:  trace left pedal edema and trace right pedal edema.   Neurologic:  No cranial nerve deficits noted. Station and gait are normal. Plantar reflexes are down-going bilaterally. DTRs are symmetrical throughout. Sensory, motor and coordinative functions appear intact. Skin:  Intact without suspicious lesions or rashes Psych:  Oriented X3, memory intact for recent and remote, normally interactive, good eye contact, not anxious appearing, not depressed appearing, not agitated, not suicidal, and subdued.     Impression & Recommendations:  Problem # 1:  HYPERTENSION (ICD-401.9) Assessment Unchanged  Her updated medication list for this problem includes:    Hydrochlorothiazide 25 Mg Tabs (Hydrochlorothiazide) .Marland Kitchen... 1 once daily  Orders: Venipuncture (13086) TLB-BMP (Basic Metabolic Panel-BMET) (80048-METABOL) TLB-Magnesium (Mg) (83735-MG)  BP today: 132/90 Prior BP: 142/84 (09/30/2008)  Prior 10 Yr Risk Heart Disease: 11 % (08/18/2008)  Labs Reviewed: K+: 3.5 (07/13/2008) Creat: : 0.7 (07/13/2008)   Chol: 165 (06/01/2008)   HDL: 46.60 (06/01/2008)   LDL: 104 (06/01/2008)   TG: 74.0 (06/01/2008)  Problem # 2:  HYPOKALEMIA (ICD-276.8) Assessment: Unchanged  Orders: Venipuncture (57846) TLB-BMP (Basic Metabolic Panel-BMET) (80048-METABOL) TLB-Magnesium (Mg) (83735-MG)  Problem # 3:  DEPRESSION (ICD-311) Assessment: Improved  Her updated medication list for this problem includes:    Cymbalta 30 Mg Cpep (Duloxetine hcl) ..... Once daily  Complete Medication List: 1)  Multivitamin  2)  Vitamin D 1000 Unit Tabs (Cholecalciferol) .... One by mouth bid 3)  Hydrochlorothiazide 25 Mg Tabs (Hydrochlorothiazide) .Marland Kitchen.. 1 once daily 4)  Asa 81mg   5)  Cymbalta 30 Mg Cpep (Duloxetine hcl) .... Once daily 6)  K-tabs 10 Meq Cr-tabs (Potassium chloride) .... 2 by mouth once daily  Hypertension Assessment/Plan:      The patient's  hypertensive risk group is category A: No risk factors and no target organ damage.  Her calculated 10 year risk of coronary heart disease is 11 %.  Today's blood pressure is 132/90.  Her blood pressure goal is < 140/90.  Patient Instructions: 1)  Please schedule a follow-up appointment in 3 months. 2)  It is important that you exercise regularly at least 20 minutes 5 times a week. If you develop chest pain, have severe difficulty breathing, or feel very tired , stop exercising immediately and seek medical attention. 3)  You need to lose weight. Consider a lower calorie diet and regular exercise.  4)  Check your Blood Pressure regularly. If it is above 140/90: you should make an appointment. Prescriptions: K-TABS 10 MEQ CR-TABS (POTASSIUM CHLORIDE) 2 by mouth once daily  #60 x 11   Entered and Authorized by:   Etta Grandchild MD   Signed by:   Etta Grandchild MD on 10/17/2008   Method used:   Electronically to        [facility]  [id number] (retail)       [address]       [place], [place]  [address]       Ph: [phone] or [phone]       Fax: [phone]   RxID:   [id number]

## 2010-02-07 NOTE — Op Note (Signed)
[facility]   Imported By: Sherian Rein 12/10/2009 11:40:38  _____________________________________________________________________  External Attachment:    Type:   Image     Comment:   External Document

## 2010-02-07 NOTE — Letter (Signed)
Summary: Glendora Score Walsh,MD   Imported By: Lester Zortman 01/12/2009 10:35:26  _____________________________________________________________________  External Attachment:    Type:   Image     Comment:   External Document

## 2010-02-07 NOTE — Progress Notes (Signed)
Summary: mail order  Phone Note Refill Request Message from:  Fax from Pharmacy on December 13, 2008 1:27 PM  Refills Requested: Medication #1:  CYMBALTA 30 MG CPEP once daily  Medication #2:  K-TABS 10 MEQ CR-TABS 2 by mouth once daily Initial call taken by: Rock Nephew CMA,  December 13, 2008 1:28 PM    Prescriptions: K-TABS 10 MEQ CR-TABS (POTASSIUM CHLORIDE) 2 by mouth once daily  #39mth x 3   Entered by:   Rock Nephew CMA   Authorized by:   Etta Grandchild MD   Signed by:   Rock Nephew CMA on 12/13/2008   Method used:   Electronically to        [facility]* (mail-order)             ,          Ph: [phone]       Fax: [phone]   RxID:   [id number] CYMBALTA 30 MG CPEP (DULOXETINE HCL) once daily  #83mth x 3   Entered by:   Rock Nephew CMA   Authorized by:   Etta Grandchild MD   Signed by:   Rock Nephew CMA on 12/13/2008   Method used:   Electronically to        [facility]* (mail-order)             ,          Ph: [phone]       Fax: [phone]   RxID:   [id number]

## 2010-02-07 NOTE — Progress Notes (Signed)
Summary: RECORDs  Phone Note Call from Patient   Summary of Call: 1.Patient is requesting to know if Dr Yetta Barre recieved her records?  2. Pt needs the last 5 years of her weights  3. Pt will drop off form req a clearance letter for bypass surgery.  Initial call taken by: Lamar Sprinkles, CMA,  August 31, 2008 6:39 PM  Follow-up for Phone Call        yes, i have her records, someone already did a form that has weights recorded back to 2000 Follow-up by: Etta Grandchild MD,  September 01, 2008 7:53 AM  Additional Follow-up for Phone Call Additional follow up Details #1::        I have copies of pt's weights at my desk in an envelope with her name on it, left mess to call office back. Additional Follow-up by: Lamar Sprinkles, CMA,  September 05, 2008 6:03 PM    Additional Follow-up for Phone Call Additional follow up Details #2::    pt returning sarah's call, pt call at [phone] Follow-up by: Verdell Face,  September 06, 2008 9:05 AM  Additional Follow-up for Phone Call Additional follow up Details #3:: Details for Additional Follow-up Action Taken: Lf mess for pt to pick up forms.....................................Marland KitchenLamar Sprinkles, CMA  September 07, 2008 6:08 PM

## 2010-02-07 NOTE — Letter (Signed)
Summary: Results Follow-up Letter  [facility]  [address] [place], [place] [address]   Phone: [phone]  Fax: [phone]    06/02/2008  [address] [place], [place]  [address]  Dear Ms. Gustafson,   The following are the results of your recent test(s):  Test     Result     Vitamin D     low _________________________________________________________  Please call for an appointment as directed _________________________________________________________ _________________________________________________________ _________________________________________________________  Sincerely,  [name] MD [facility]

## 2010-02-07 NOTE — Letter (Signed)
Summary: Results Follow-up Letter  [facility]  [address] [place], [place] [address]   Phone: [phone]  Fax: [phone]    03/21/2009  [address] [place], [place]  [address]  Dear [name],   The following are the results of your recent test(s):  Test     Result     Vitamin D     slightly low CBC       slightly elevated WBC Blood sugars   normal average Kidneys     normal  _________________________________________________________  Please call for an appointment in 1-2 months _________________________________________________________ _________________________________________________________ _________________________________________________________  Sincerely,  [name] MD [facility]

## 2010-02-07 NOTE — Letter (Signed)
[facility]   Imported By: Sherian Rein 06/01/2009 12:34:04  _____________________________________________________________________  External Attachment:    Type:   Image     Comment:   External Document

## 2010-02-07 NOTE — Assessment & Plan Note (Signed)
Summary: 2 MTH FU  STC   Vital Signs:  Patient profile:   55 year old female Menstrual status:  postmenopausal Height:      63 inches Weight:      241 pounds BMI:     42.85 O2 Sat:      96 % on Room air Temp:     98.5 degrees F oral Pulse rate:   77 / minute Pulse rhythm:   regular Resp:     16 per minute BP sitting:   122 / 78  (right arm)  Vitals Entered By: Bill Salinas CMA (January 01, 2010 8:41 AM)  O2 Flow:  Room air CC: follow-up visit/ ab, Preventive Care   Primary Care Provider:  Etta Grandchild MD  CC:  follow-up visit/ ab and Preventive Care.  History of Present Illness:  Follow-Up Visit      This is a 55 year old woman who presents for Follow-up visit.  The patient denies chest pain, palpitations, dizziness, syncope, low blood sugar symptoms, high blood sugar symptoms, edema, SOB, DOE, PND, and orthopnea.  Since the last visit the patient notes no new problems or concerns.  The patient reports taking meds as prescribed, monitoring BP, and dietary compliance.  When questioned about possible medication side effects, the patient notes none.    Preventive Screening-Counseling & Management  Alcohol-Tobacco     Alcohol drinks/day: 0     Alcohol Counseling: not indicated; patient does not drink     Smoking Status: never     Tobacco Counseling: not indicated; no tobacco use  Hep-HIV-STD-Contraception     Hepatitis Risk: no risk noted     HIV Risk: no risk noted     STD Risk: no risk noted      Drug Use:  no.    Clinical Review Panels:  Prevention   Last Colonoscopy:  Normal (10/09/2004)  Immunizations   Last Tetanus Booster:  Tdap (03/20/2009)   Last Flu Vaccine:  Fluvax 3+ (10/18/2009)  Lipid Management   Cholesterol:  165 (06/01/2008)   LDL (bad choesterol):  104 (06/01/2008)   HDL (good cholesterol):  46.60 (06/01/2008)  Diabetes Management   HgBA1C:  5.9 (10/18/2009)   Creatinine:  0.6 (10/18/2009)   Last Flu Vaccine:  Fluvax 3+  (10/18/2009)  CBC   WBC:  7.9 (10/18/2009)   RBC:  4.22 (10/18/2009)   Hgb:  12.9 (10/18/2009)   Hct:  38.2 (10/18/2009)   Platelets:  255.0 (10/18/2009)   MCV  90.5 (10/18/2009)   MCHC  33.7 (10/18/2009)   RDW  14.9 (10/18/2009)   PMN:  62.3 (10/18/2009)   Lymphs:  29.1 (10/18/2009)   Monos:  6.6 (10/18/2009)   Eosinophils:  1.4 (10/18/2009)   Basophil:  0.6 (10/18/2009)  Complete Metabolic Panel   Glucose:  92 (10/18/2009)   Sodium:  138 (10/18/2009)   Potassium:  3.6 (10/18/2009)   Chloride:  100 (10/18/2009)   CO2:  31 (10/18/2009)   BUN:  10 (10/18/2009)   Creatinine:  0.6 (10/18/2009)   Albumin:  3.9 (10/18/2009)   Total Protein:  7.4 (10/18/2009)   Calcium:  9.2 (10/18/2009)   Total Bili:  0.6 (10/18/2009)   Alk Phos:  88 (10/18/2009)   SGPT (ALT):  23 (10/18/2009)   SGOT (AST):  23 (10/18/2009)   Medications Prior to Update: 1)  Multivitamin 2)  Vitamin D 1000 Unit Tabs (Cholecalciferol) .... One By Mouth Bid 3)  Asa 81mg  4)  Hydrochlorothiazide 25 Mg Tabs (Hydrochlorothiazide) .Marland KitchenMarland KitchenMarland Kitchen  Take 1 Tablet By Mouth Once A Day 5)  Naproxen 500 Mg Tabs (Naproxen) .Marland Kitchen.. 1 Tab By Mouth Two Times A Day X7-10 Days and Then As Needed.  Take W/ Food. 6)  Cymbalta 60 Mg Cpep (Duloxetine Hcl) .... One By Mouth Once Daily For Depression  Current Medications (verified): 1)  Multivitamin 2)  Vitamin D 1000 Unit Tabs (Cholecalciferol) .... One By Mouth Bid 3)  Asa 81mg  4)  Hydrochlorothiazide 25 Mg Tabs (Hydrochlorothiazide) .... Take 1 Tablet By Mouth Once A Day 5)  Cymbalta 60 Mg Cpep (Duloxetine Hcl) .... One By Mouth Once Daily For Depression 6)  Klor-Con M20 20 Meq Cr-Tabs (Potassium Chloride Crys Cr) .... One By Mouth Once Daily  Allergies (verified): 1)  ! * Lisinopril 2)  ! * Diltiazem  Past History:  Past Medical History: Last updated: 05/25/2009 HYPERGLYCEMIA  PANCREATITIS, HX OF  G E R D Anxiety Disorder Hypertension Hx. of Tracheostomy Hx. Resp.  Failure Chronic Cough ERCP(08/08/04) Hypokalemia Depression  Past Surgical History: Last updated: 04/01/2007 posr ERCP Appendectomy Breast Surgery: reduction Cholecystectomy Total Abdominal Hysterectomy Hammer Toe Surgery Tubal Ligation Intestinal repair(twisted intestine)1999  Family History: Last updated: 06/01/2008 Family History Diabetes 1st degree relative Family History Hypertension  Social History: Last updated: 06/01/2008 Occupation: Water engineer Married Never Smoked Alcohol use-no Drug use-no Regular exercise-no  Risk Factors: Alcohol Use: 0 (01/01/2010) Exercise: no (06/01/2008)  Risk Factors: Smoking Status: never (01/01/2010)  Family History: Reviewed history from 06/01/2008 and no changes required. Family History Diabetes 1st degree relative Family History Hypertension  Social History: Reviewed history from 06/01/2008 and no changes required. 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    Physical Exam  General:  alert, well-developed, well-nourished, well-hydrated, appropriate dress, normal appearance, healthy-appearing, cooperative to examination, good hygiene, and overweight-appearing.   Head:  normocephalic and atraumatic.   Eyes:  vision grossly intact, pupils equal, pupils round, and pupils reactive to light.   Mouth:  Oral mucosa and oropharynx without lesions or exudates.  Teeth in good repair. Neck:  No deformities, masses, or tenderness noted. Lungs:  normal respiratory effort, no intercostal retractions, no accessory muscle use, normal breath sounds, no dullness, no fremitus, no crackles, and no  wheezes.   Heart:  normal rate, regular rhythm, no murmur, no gallop, no rub, no JVD, and no HJR.   Abdomen:  soft, non-tender, normal bowel sounds, no distention, no masses, no guarding, no rigidity, no rebound tenderness, no hepatomegaly, and no splenomegaly.   Msk:  normal ROM, no joint tenderness, no joint swelling, no joint warmth, no redness over joints, no joint deformities, no joint instability, no crepitation, and no muscle atrophy.   Pulses:  R and L carotid,radial,femoral,dorsalis pedis and posterior tibial pulses are full and equal bilaterally Extremities:  No clubbing, cyanosis, edema, or deformity noted with normal full range of motion of all joints.   Neurologic:  No cranial nerve deficits noted. Station and gait are normal. Plantar reflexes are down-going bilaterally. DTRs are symmetrical throughout. Sensory, motor and coordinative functions appear intact. Skin:  Intact without suspicious lesions or rashes Cervical Nodes:  No lymphadenopathy noted Psych:  Oriented X3, memory intact for recent and remote, normally interactive, good eye contact, not anxious appearing, not depressed appearing,  not agitated, not suicidal, and not homicidal.     Impression & Recommendations:  Problem # 1:  VITAMIN D DEFICIENCY (ICD-268.9) Assessment Unchanged  Orders: Venipuncture (04540) TLB-BMP (Basic Metabolic Panel-BMET) (80048-METABOL) T-Vitamin D (25-Hydroxy) (98119-14782)  Problem # 2:  DEPRESSION (ICD-311) Assessment: Improved  Her updated medication list for this problem includes:    Cymbalta 60 Mg Cpep (Duloxetine hcl) ..... One by mouth once daily for depression  Discussed treatment options, including trial of antidpressant medication. Will refer to behavioral health. Follow-up call in in 24-48 hours and recheck in 2 weeks, sooner as needed. Patient agrees to call if any worsening of symptoms or thoughts of doing harm arise. Verified that the patient has no suicidal ideation at this  time.   Problem # 3:  HYPERTENSION (ICD-401.9) Assessment: Improved  Her updated medication list for this problem includes:    Hydrochlorothiazide 25 Mg Tabs (Hydrochlorothiazide) .Marland Kitchen... Take 1 tablet by mouth once a day  Orders: Venipuncture (95621) TLB-BMP (Basic Metabolic Panel-BMET) (80048-METABOL) T-Vitamin D (25-Hydroxy) (30865-78469)  BP today: 122/78 Prior BP: 136/80 (10/18/2009)  Prior 10 Yr Risk Heart Disease: 8 % (07/23/2009)  Labs Reviewed: K+: 3.6 (10/18/2009) Creat: : 0.6 (10/18/2009)   Chol: 165 (06/01/2008)   HDL: 46.60 (06/01/2008)   LDL: 104 (06/01/2008)   TG: 74.0 (06/01/2008)  Problem # 4:  HYPOKALEMIA (ICD-276.8) Assessment: Unchanged  Orders: Venipuncture (62952) TLB-BMP (Basic Metabolic Panel-BMET) (80048-METABOL) T-Vitamin D (25-Hydroxy) (84132-44010)  Complete Medication List: 1)  Multivitamin  2)  Vitamin D 1000 Unit Tabs (Cholecalciferol) .... One by mouth bid 3)  Asa 81mg   4)  Hydrochlorothiazide 25 Mg Tabs (Hydrochlorothiazide) .... Take 1 tablet by mouth once a day 5)  Cymbalta 60 Mg Cpep (Duloxetine hcl) .... One by mouth once daily for depression 6)  Klor-con M20 20 Meq Cr-tabs (Potassium chloride crys cr) .... One by mouth once daily  Other Orders: Radiology Referral (Radiology)  Colorectal Screening:  Current Recommendations:    Colonoscopy recommended: patient defers today but will consider in the future  Colonoscopy Results:    Date of Exam: 10/09/2004    Results: Normal  PAP Screening:    Hx Cervical Dysplasia in last 5 yrs? No    3 normal PAP smears in last 5 yrs? No    Reviewed PAP smear recommendations:  patient defers to GYN provider  Mammogram Screening:    Reviewed Mammogram recommendations:  mammogram ordered  Osteoporosis Risk Assessment:  Risk Factors for Fracture or Low Bone Density:   Smoking status:       never  Immunization & Chemoprophylaxis:    Tetanus vaccine: Tdap  (03/20/2009)    Influenza vaccine:  Fluvax 3+  (10/18/2009)  Patient Instructions: 1)  Please schedule a follow-up appointment in 4 months. 2)  It is important that you exercise regularly at least 20 minutes 5 times a week. If you develop chest pain, have severe difficulty breathing, or feel very tired , stop exercising immediately and seek medical attention. 3)  You need to lose weight. Consider a lower calorie diet and regular exercise.  4)  Check your Blood Pressure regularly. If it is above 140/90: you should make an appointment. Prescriptions: KLOR-CON M20 20 MEQ CR-TABS (POTASSIUM CHLORIDE CRYS CR) One by mouth once daily  #90 x 3   Entered and Authorized by:   Etta Grandchild MD   Signed by:   Etta Grandchild MD on 01/01/2010   Method used:   Printed then faxed to .[facility] (mail-order)             , [place]         Ph: [phone]       Fax: [phone]   RxID:   [id number]    Orders Added: 1)  Venipuncture [62952] 2)  TLB-BMP (Basic Metabolic Panel-BMET) [80048-METABOL] 3)  T-Vitamin D (25-Hydroxy) [84132-44010] 4)  Radiology Referral [Radiology] 5)  Est. Patient Level IV [27253]

## 2010-02-07 NOTE — Letter (Signed)
Summary: Results Follow-up Letter  [facility]  [address] [place], [place] [address]   Phone: [phone]  Fax: [phone]    07/13/2008  [address] [place], [place]  [address]  Dear Ms. Hornback,   The following are the results of your recent test(s):  Test     Result     Potassium     borderline low  _________________________________________________________  Please call for an appointment in 4 weeks _________________________________________________________ _________________________________________________________ _________________________________________________________  Sincerely,  [name] MD [facility]

## 2010-02-07 NOTE — Assessment & Plan Note (Signed)
Summary: DR Nemiah Commander PT/NO CLINIC-L RED SWELLING LEG--STC   Vital Signs:  Patient profile:   55 year old female Menstrual status:  postmenopausal Height:      63 inches (160.02 cm) Weight:      251.12 pounds (114.15 kg) O2 Sat:      95 % on Room air Temp:     98.3 degrees F (36.83 degrees C) oral Pulse rate:   86 / minute BP sitting:   112 / 76  (left arm) Cuff size:   large  Vitals Entered By: Orlan Leavens (May 25, 2009 8:42 AM)  O2 Flow:  Room air CC: swelling in (L) leg Is Patient Diabetic? No Pain Assessment Patient in pain? no        Primary Care Provider:  Etta Grandchild MD  CC:  swelling in (L) leg.  History of Present Illness: here today with complaint of left foot and leg pain/redness    . onset of symptoms was 6 days ago. course has been gradual onset and now occurs in waning pattern. problem precipitated by ?insect bite symptom characterized as tenerness over medial ankle and mild swelling in foot and ankle - symptom radiates to nowhere - not calf, knees or toes - problem not associated with fever, or ulceration - symptoms improved by elevation. symptoms worsened with activity - standing, walking. no prior hx of same symptoms.   seen at [facility] care 2 days ago for same - given rx for doxy - but [facility] - "warfarin 2.5mg " under different pt name on her bottle - took 1 tab before realizing error - no bleeding, bruising [facility], pt has not yet returned to [facility], not on doxy -  Current Medications (verified): 1)  Multivitamin 2)  Vitamin D 1000 Unit Tabs (Cholecalciferol) .... One By Mouth Bid 3)  Asa 81mg  4)  Cymbalta 30 Mg Cpep (Duloxetine Hcl) .... Once Daily 5)  Hydrochlorothiazide 25 Mg Tabs (Hydrochlorothiazide) .... Take 1 Tablet By Mouth Once A Day  Allergies (verified): 1)  ! * Lisinopril 2)  ! * Diltiazem  Past History:  Past Medical History: HYPERGLYCEMIA  PANCREATITIS, HX OF  G E R D Anxiety Disorder Hypertension Hx. of  Tracheostomy Hx. Resp. Failure Chronic Cough ERCP(08/08/04) Hypokalemia Depression  Review of Systems  The patient denies anorexia, fever, weight loss, chest pain, headaches, and difficulty walking.    Physical Exam  General:  alert, well-developed, well-nourished, and cooperative to examination.   nontoxic Lungs:  normal respiratory effort, no intercostal retractions or use of accessory muscles; normal breath sounds bilaterally - no crackles and no wheezes.    Heart:  normal rate, regular rhythm, no murmur, and no rub. BLE without edema. Msk:  left ankle - FROM without tenderness or pain Skin:  mild diffuse swelling across ankle and dorsum of left foot - min erythema compared to right foot/ankle - no ulceration or nodules -  -    Impression & Recommendations:  Problem # 1:  CELLULITIS AND ABSCESS OF LEG EXCEPT FOOT (ICD-682.6)  Her updated medication list for this problem includes:    Septra Ds 800-160 Mg Tabs (Sulfamethoxazole-trimethoprim) .Marland Kitchen... 1 by mouth two times a day x 5 days  Elevate affected area. Warm moist compresses for 20 minutes every 2 hours while awake. Take antibiotics as directed and take acetaminophen as needed. To be seen in 48-72 hours if no improvement, sooner if worse.   reassured there is no harm done with single tab of  warfarin 2.5 mg 2 days ago - [facility] Time spent with patient 25 minutes, more than 50% of this time was spent counseling patient on med error and plans for treatment of her skin infection  Orders: Prescription Created Electronically [phone])  Complete Medication List: 1)  Multivitamin  2)  Vitamin D 1000 Unit Tabs (Cholecalciferol) .... One by mouth bid 3)  Asa 81mg   4)  Cymbalta 30 Mg Cpep (Duloxetine hcl) .... Once daily 5)  Hydrochlorothiazide 25 Mg Tabs (Hydrochlorothiazide) .... Take 1 tablet by mouth once a day 6)  Septra Ds 800-160 Mg Tabs (Sulfamethoxazole-trimethoprim) .Marland Kitchen.. 1 by mouth two times a day x 5  days  Patient Instructions: 1)  it was good to see you today.  2)  will treat skin infection with antibiotics - Septra as discussed - your prescription has been electronically submitted to your pharmacy. Please take as directed. Contact our office if you believe you're having problems with the medication(s).  3)  keep foot elevated, use cool pack to tender area as needed - 4)  Take 650-1000mg  of Tylenol every 4-6 hours as needed for relief of pain or comfort of fever AVOID taking more than 4000mg   in a 24 hour period (can cause liver damage in higher doses). 5)  Please schedule a follow-up appointment as needed. Prescriptions: SEPTRA DS 800-160 MG TABS (SULFAMETHOXAZOLE-TRIMETHOPRIM) 1 by mouth two times a day x 5 days  #10 x 0   Entered and Authorized by:   Newt Lukes MD   Signed by:   Newt Lukes MD on 05/25/2009   Method used:   [facility]  [address] [id number]       [address]       [place], [place]  [address]       Ph: [phone] or [phone]       Fax: [phone]   RxID:   [id number]

## 2010-02-07 NOTE — Progress Notes (Signed)
Summary: LETTER FOR SURGERY  Phone Note Call from Patient Call back at Work Phone [phone]   Summary of Call: Pt has decided to get the adjustable lap band for weight loss. She needs a letter from Dr stating that she would be a good candidate for this procedure.   Pt also needs medical records, will inform her to contact medical records to complete releases.   Initial call taken by: Lamar Sprinkles, CMA,  December 04, 2008 10:26 AM  Follow-up for Phone Call        Letter pending signature Follow-up by: Lamar Sprinkles, CMA,  December 05, 2008 5:38 PM  Additional Follow-up for Phone Call Additional follow up Details #1::        Spoke with patient and letter faxed to [phone] attn sherry (bariatric clinic). She states that the clinic has contacted medical records Additional Follow-up by: Rock Nephew CMA,  December 06, 2008 8:47 AM

## 2010-02-07 NOTE — Assessment & Plan Note (Signed)
Summary: 1 MOROV /NWS $50   Vital Signs:  Patient profile:   55 year old female Menstrual status:  postmenopausal Height:      63 inches Weight:      291 pounds O2 Sat:      92 % on Room air Temp:     98.5 degrees F oral Pulse rate:   98 / minute Resp:     16 per minute BP sitting:   142 / 86  (left arm) Cuff size:   large  Vitals Entered By: christy freeman-ma  O2 Flow:  Room air CC: F/U, Hypertension Management Is Patient Diabetic? No Pain Assessment Patient in pain? no        Primary Care Apollo Timothy:  Etta Grandchild MD  CC:  F/U and Hypertension Management.  History of Present Illness: She returns for f/up and is doing well on Cymbalta with better mood and decreased cravings for junk food which has resulted in a 7  pound weight loss. She is down to every other day on Toprol and is not having any palpitations.  Hypertension History:      She complains of peripheral edema, but denies headache, chest pain, palpitations, dyspnea with exertion, visual symptoms, neurologic problems, syncope, and side effects from treatment.  She notes no problems with any antihypertensive medication side effects.        Positive major cardiovascular risk factors include hypertension.  Negative major cardiovascular risk factors include female age less than 17 years old, no history of diabetes or hyperlipidemia, negative family history for ischemic heart disease, and non-tobacco-user status.        Further assessment for target organ damage reveals no history of ASHD, cardiac end-organ damage (CHF/LVH), stroke/TIA, peripheral vascular disease, renal insufficiency, or hypertensive retinopathy.     Clinical Review Panels:  Lipid Management   Cholesterol:  165 (06/01/2008)   LDL (bad choesterol):  104 (06/01/2008)   HDL (good cholesterol):  46.60 (06/01/2008)  Diabetes Management   HgBA1C:  5.9 (06/01/2008)   Creatinine:  0.7 (07/13/2008)  CBC   WBC:  8.6 (06/01/2008)   RBC:  4.25  (06/01/2008)   Hgb:  12.6 (06/01/2008)   Hct:  37.1 (06/01/2008)   Platelets:  261.0 (06/01/2008)   MCV  87.4 (06/01/2008)   MCHC  34.1 (06/01/2008)   RDW  13.9 (06/01/2008)   PMN:  71.8 (06/01/2008)   Lymphs:  23.5 (06/01/2008)   Monos:  2.9 (06/01/2008)   Eosinophils:  1.6 (06/01/2008)   Basophil:  0.2 (06/01/2008)  Complete Metabolic Panel   Glucose:  101 (07/13/2008)   Sodium:  141 (07/13/2008)   Potassium:  3.5 (07/13/2008)   Chloride:  101 (07/13/2008)   CO2:  31 (07/13/2008)   BUN:  11 (07/13/2008)   Creatinine:  0.7 (07/13/2008)   Albumin:  3.8 (06/01/2008)   Total Protein:  7.5 (06/01/2008)   Calcium:  9.1 (07/13/2008)   Total Bili:  0.7 (06/01/2008)   Alk Phos:  90 (06/01/2008)   SGPT (ALT):  23 (06/01/2008)   SGOT (AST):  21 (06/01/2008)   Current Medications (verified): 1)  Potassium 2)  Toprol Xl 25 Mg Xr24h-Tab (Metoprolol Succinate) .... 1/2 Tab Once Daily 3)  Multivitamin 4)  Vitamin D 1000 Unit Tabs (Cholecalciferol) .... One By Mouth Bid 5)  Hydrochlorothiazide 25 Mg Tabs (Hydrochlorothiazide) .Marland Kitchen.. 1 Once Daily 6)  Asa 81mg  7)  Cymbalta 30 Mg Cpep (Duloxetine Hcl) .... Once Daily  Allergies (verified): 1)  ! * Lisinopril 2)  ! *  Diltiazem  Past History:  Past Medical History: Reviewed history from 07/13/2008 and no changes required. HYPERGLYCEMIA (ICD-790.29) PANCREATITIS, HX OF (ICD-V12.70) G E R D (ICD-530.81) Anxiety Disorder Hypertension Hx. of Tracheostomy Hx. Resp. Failure Chronic Cough ERCP(08/08/04) Hypokalemia Depression  Past Surgical History: Reviewed history from 04/01/2007 and no changes required. posr ERCP Appendectomy Breast Surgery: reduction Cholecystectomy Total Abdominal Hysterectomy Hammer Toe Surgery Tubal Ligation Intestinal repair(twisted intestine)1999  Family History: Reviewed history from 06/01/2008 and no changes required. Family History Diabetes 1st degree relative Family History  Hypertension  Social History: Reviewed history from 06/01/2008 and no changes required. 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   Psych:  Denies anxiety, depression, easily angered, easily tearful, irritability, panic attacks, sense of great danger, suicidal thoughts/plans, and thoughts of violence.  Physical Exam  General:  alert, well-developed, well-nourished, well-hydrated, healthy-appearing, cooperative to examination, good hygiene, and overweight-appearing.   Neck:  supple, full ROM, no masses, and no JVD.   Lungs:  Normal respiratory effort, chest expands symmetrically. Lungs are clear to auscultation, no crackles or wheezes. Heart:  Normal rate and regular rhythm. S1 and S2 normal without gallop, murmur, click, rub or other extra sounds. Abdomen:  Bowel sounds positive,abdomen soft and non-tender without masses, organomegaly or hernias noted. Msk:  No deformity or scoliosis noted of thoracic or lumbar spine.   Extremities:  1+ left pedal edema and 1+ right pedal edema.   Neurologic:  No cranial nerve deficits noted. Station and gait are normal. Plantar reflexes are down-going bilaterally. DTRs are symmetrical throughout. Sensory, motor and coordinative functions appear intact. Skin:  Intact without suspicious lesions or rashes Psych:  Oriented X3, memory intact for recent and remote, normally interactive, good eye contact, not anxious appearing, not depressed appearing, not agitated, not suicidal, and subdued.     Impression & Recommendations:  Problem # 1:  DEPRESSION (ICD-311) Assessment Improved  Her updated medication list for this problem includes:    Cymbalta 30 Mg Cpep (Duloxetine hcl) ..... Once daily  Problem # 2:  HYPERTENSION (ICD-401.9) Assessment: Unchanged  Her updated medication list for this problem includes:    Toprol Xl 25 Mg  Xr24h-tab (Metoprolol succinate) .Marland Kitchen... 1/2 tab once daily    Hydrochlorothiazide 25 Mg Tabs (Hydrochlorothiazide) .Marland Kitchen... 1 once daily  Problem # 3:  ABNORMAL WEIGHT GAIN (ICD-783.1) Assessment: Improved  Complete Medication List: 1)  Potassium  2)  Toprol Xl 25 Mg Xr24h-tab (Metoprolol succinate) .... 1/2 tab once daily 3)  Multivitamin  4)  Vitamin D 1000 Unit Tabs (Cholecalciferol) .... One by mouth bid 5)  Hydrochlorothiazide 25 Mg Tabs (Hydrochlorothiazide) .Marland Kitchen.. 1 once daily 6)  Asa 81mg   7)  Cymbalta 30 Mg Cpep (Duloxetine hcl) .... Once daily  Hypertension Assessment/Plan:      The patient's hypertensive risk group is category A: No risk factors and no target organ damage.  Her calculated 10 year risk of coronary heart disease is 11 %.  Today's blood pressure is 142/86.  Her blood pressure goal is < 140/90.  Patient Instructions: 1)  Please schedule a follow-up appointment in 2 months. 2)  It is important that you exercise regularly at least 20 minutes 5 times a week. If you develop chest pain, have severe difficulty breathing, or feel very tired , stop exercising immediately and seek medical attention. 3)  You need to lose weight. Consider a lower calorie diet and  regular exercise.  4)  Check your Blood Pressure regularly. If it is above 140/90: you should make an appointment. Prescriptions: CYMBALTA 30 MG CPEP (DULOXETINE HCL) once daily  #30 x 11   Entered and Authorized by:   Etta Grandchild MD   Signed by:   Etta Grandchild MD on 08/18/2008   Method used:   Print then Give to Patient   RxID:   [id number]

## 2010-02-07 NOTE — Letter (Signed)
Summary: Results Follow-up Letter  [facility]  [address] [place], [place] [address]   Phone: [phone]  Fax: [phone]    11/24/2008  [address] [place], [place]  [address]  Dear Ms. Wadhwa,   The following are the results of your recent test(s):  Test     Result     Kidney     normal Potassium     borderline low  _________________________________________________________  Please call for an appointment as directed, increase your potassium intake _________________________________________________________ _________________________________________________________ _________________________________________________________  Sincerely,  [name] MD [facility]

## 2010-02-07 NOTE — Assessment & Plan Note (Signed)
Summary: 1 MO ROV /NWS   Vital Signs:  Patient profile:   55 year old female Menstrual status:  postmenopausal Height:      63 inches Weight:      284 pounds BMI:     50.49 O2 Sat:      96 % on Room air Temp:     98.6 degrees F oral Pulse rate:   103 / minute Pulse rhythm:   regular BP sitting:   130 / 84  (left arm) Cuff size:   large  Vitals Entered By: Rock Nephew CMA (November 24, 2008 8:34 AM)  Nutrition Counseling: Patient's BMI is greater than 25 and therefore counseled on weight management options.  O2 Flow:  Room air CC: follow-up visit, Hypertension Management Is Patient Diabetic? No   Primary Care Provider:  Etta Grandchild MD  CC:  follow-up visit and Hypertension Management.  History of Present Illness: She returns for BP check and complains of ankle swelling and wants to take a higher dose of HCTZ.  Hypertension History:      She complains of peripheral edema, but denies headache, chest pain, palpitations, dyspnea with exertion, visual symptoms, neurologic problems, syncope, and side effects from treatment.  She notes no problems with any antihypertensive medication side effects.        Positive major cardiovascular risk factors include hypertension.  Negative major cardiovascular risk factors include female age less than 20 years old, no history of diabetes or hyperlipidemia, negative family history for ischemic heart disease, and non-tobacco-user status.        Further assessment for target organ damage reveals no history of ASHD, cardiac end-organ damage (CHF/LVH), stroke/TIA, peripheral vascular disease, renal insufficiency, or hypertensive retinopathy.     Preventive Screening-Counseling & Management  Alcohol-Tobacco     Smoking Status: never  Clinical Review Panels:  Complete Metabolic Panel   Glucose:  94 (10/17/2008)   Sodium:  143 (10/17/2008)   Potassium:  3.6 (10/17/2008)   Chloride:  97 (10/17/2008)   CO2:  34 (10/17/2008)   BUN:  12  (10/17/2008)   Creatinine:  0.7 (10/17/2008)   Albumin:  3.8 (06/01/2008)   Total Protein:  7.5 (06/01/2008)   Calcium:  9.4 (10/17/2008)   Total Bili:  0.7 (06/01/2008)   Alk Phos:  90 (06/01/2008)   SGPT (ALT):  23 (06/01/2008)   SGOT (AST):  21 (06/01/2008)   Current Medications (verified): 1)  Multivitamin 2)  Vitamin D 1000 Unit Tabs (Cholecalciferol) .... One By Mouth Bid 3)  Asa 81mg  4)  Cymbalta 30 Mg Cpep (Duloxetine Hcl) .... Once Daily 5)  K-Tabs 10 Meq Cr-Tabs (Potassium Chloride) .... 2 By Mouth Once Daily 6)  Tribenzor 20-5-12.5 Mg Tabs (Olmesartan-Amlodipine-Hctz) .... Once Daily For High Blood Pressure  Allergies (verified): 1)  ! * Lisinopril 2)  ! * Diltiazem  Past History:  Past Medical History: Reviewed history from 07/13/2008 and no changes required. HYPERGLYCEMIA (ICD-790.29) PANCREATITIS, HX OF (ICD-V12.70) G E R D (ICD-530.81) Anxiety Disorder Hypertension Hx. of Tracheostomy Hx. Resp. Failure Chronic Cough ERCP(08/08/04) Hypokalemia Depression  Past Surgical History: Reviewed history from 04/01/2007 and no changes required. posr ERCP Appendectomy Breast Surgery: reduction Cholecystectomy Total Abdominal Hysterectomy Hammer Toe Surgery Tubal Ligation Intestinal repair(twisted intestine)1999  Family History: Reviewed history from 06/01/2008 and no changes required. Family History Diabetes 1st degree relative Family History Hypertension  Social History: Reviewed history from 06/01/2008 and no changes required. 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   General:  Denies fatigue, loss of appetite, malaise, and weakness.  Physical Exam  General:  alert, well-developed, well-nourished, well-hydrated, and overweight-appearing.   Mouth:  Oral mucosa and oropharynx without  lesions or exudates.  Teeth in good repair. Neck:  No deformities, masses, or tenderness noted. Lungs:  Normal respiratory effort, chest expands symmetrically. Lungs are clear to auscultation, no crackles or wheezes. Heart:  Normal rate and regular rhythm. S1 and S2 normal without gallop, murmur, click, rub or other extra sounds. Abdomen:  soft, non-tender, normal bowel sounds, no distention, no masses, no hepatomegaly, and no splenomegaly.   Msk:  normal ROM, no joint tenderness, no joint swelling, and no joint warmth.   Pulses:  R and L carotid,radial,femoral,dorsalis pedis and posterior tibial pulses are full and equal bilaterally Extremities:  1+ left pedal edema and 1+ right pedal edema.   Neurologic:  No cranial nerve deficits noted. Station and gait are normal. Plantar reflexes are down-going bilaterally. DTRs are symmetrical throughout. Sensory, motor and coordinative functions appear intact. Skin:  Intact without suspicious lesions or rashes Psych:  Oriented X3, memory intact for recent and remote, normally interactive, good eye contact, not anxious appearing, not depressed appearing, not agitated, not suicidal, and subdued.     Impression & Recommendations:  Problem # 1:  HYPERTENSION (ICD-401.9) Assessment Unchanged  The following medications were removed from the medication list:    Tribenzor 20-5-12.5 Mg Tabs (Olmesartan-amlodipine-hctz) ..... Once daily for high blood pressure Her updated medication list for this problem includes:    Tribenzor 40-5-25 Mg Tabs (Olmesartan-amlodipine-hctz) ..... One by mouth each day for high blood pressure  Orders: Venipuncture (16109) TLB-BMP (Basic Metabolic Panel-BMET) (80048-METABOL)  BP today: 130/84 Prior BP: 152/90 (10/25/2008)  10 Yr Risk Heart Disease: 8 % Prior 10 Yr Risk Heart Disease: 11 % (08/18/2008)  Labs Reviewed: K+: 3.6 (10/17/2008) Creat: : 0.7 (10/17/2008)   Chol: 165 (06/01/2008)   HDL: 46.60 (06/01/2008)   LDL:  104 (06/01/2008)   TG: 74.0 (06/01/2008)  Problem # 2:  HYPOKALEMIA (ICD-276.8) Assessment: Unchanged  Orders: Venipuncture (60454) TLB-BMP (Basic Metabolic Panel-BMET) (80048-METABOL)  Complete Medication List: 1)  Multivitamin  2)  Vitamin D 1000 Unit Tabs (Cholecalciferol) .... One by mouth bid 3)  Asa 81mg   4)  Cymbalta 30 Mg Cpep (Duloxetine hcl) .... Once daily 5)  K-tabs 10 Meq Cr-tabs (Potassium chloride) .... 2 by mouth once daily 6)  Tribenzor 40-5-25 Mg Tabs (Olmesartan-amlodipine-hctz) .... One by mouth each day for high blood pressure  Hypertension Assessment/Plan:      The patient's hypertensive risk group is category A: No risk factors and no target organ damage.  Her calculated 10 year risk of coronary heart disease is 8 %.  Today's blood pressure is 130/84.  Her blood pressure goal is < 140/90.  Patient Instructions: 1)  Please schedule a follow-up appointment in 3 months. 2)  It is important that you exercise regularly at least 20 minutes 5 times a week. If you develop chest pain, have severe difficulty breathing, or feel very tired , stop exercising immediately and seek medical attention. 3)  You need to lose weight. Consider a lower calorie diet and regular exercise.  4)  Check your Blood Pressure regularly. If it is above 140/90: you should make an appointment. Prescriptions: TRIBENZOR 40-5-25 MG TABS (OLMESARTAN-AMLODIPINE-HCTZ) one by mouth each day for high blood pressure  #70 x 0  Entered and Authorized by:   Etta Grandchild MD   Signed by:   Etta Grandchild MD on 11/24/2008   Method used:   Samples Given   RxID:   [id number]

## 2010-02-07 NOTE — Letter (Signed)
[facility]: [facility]   Imported By: Lester Herminie 05/04/2009 10:24:27  _____________________________________________________________________  External Attachment:    Type:   Image     Comment:   External Document

## 2010-02-07 NOTE — Assessment & Plan Note (Signed)
Summary: DISCUSS MEDS/ $50 Natale Milch   Vital Signs:  Patient profile:   55 year old female Menstrual status:  postmenopausal Height:      63 inches Weight:      298 pounds BMI:     52.98 O2 Sat:      96 % on Room air Temp:     98.6 degrees F oral Pulse rate:   86 / minute Pulse rhythm:   regular BP sitting:   130 / 80  (left arm) Cuff size:   large  Vitals Entered By: Rock Nephew CMA (July 13, 2008 8:16 AM)  Nutrition Counseling: Patient's BMI is greater than 25 and therefore counseled on weight management options.  O2 Flow:  Room air  Primary Care Moiz Ryant:  Etta Grandchild MD   History of Present Illness: She returns for followup. As part of her evaluation for bariatric surgery she is seeing a psychologist and they believe she is depressed. She comes in today requesting that she be placed on an antidepressant.  She is also complaining of leg cramps and is concerned that her potassium  level may be low.  Depression History:      The patient presents with symptoms of depression which have been present for greater than two weeks.  The patient is having a depressed mood most of the day and has a diminished interest in her usual daily activities.  Positive alarm features for depression include significant weight gain, insomnia, psychomotor retardation, fatigue (loss of energy), feelings of worthlessness (guilt), and impaired concentration (indecisiveness).  However, she denies significant weight loss, hypersomnia, psychomotor agitation, and recurrent thoughts of death or suicide.  Positive alarm features for a manic disorder include distractibility.  She denies persistently & abnormally elevated mood, abnormally & persistently irritable mood, less need for sleep, flight of ideas, increase in goal-directed activity, psychomotor agitation, inflated self-esteem or grandiosity, and excessive buying sprees.        Psychosocial stress factors include a recent traumatic event.  Risk factors for  depression include a personal history of depression.  The patient denies that she feels like life is not worth living, denies that she wishes that she were dead, and denies that she has thought about ending her life.         Depression Treatment History:  Prior Medication Used:   Start Date: Assessment of Effect:   Comments:  Zoloft (sertraline)     09/20/2003   no improvement       -- Effexor     09/21/2001   no improvement       -- Wellbutrin (bupropion)     09/18/2004   no improvement       --  Hypertension History:      She complains of peripheral edema and side effects from treatment, but denies headache, chest pain, palpitations, dyspnea with exertion, orthopnea, PND, visual symptoms, neurologic problems, and syncope.  She notes the following problems with antihypertensive medication side effects: leg cramps.        Positive major cardiovascular risk factors include hypertension.  Negative major cardiovascular risk factors include female age less than 71 years old, no history of diabetes or hyperlipidemia, negative family history for ischemic heart disease, and non-tobacco-user status.        Further assessment for target organ damage reveals no history of ASHD, cardiac end-organ damage (CHF/LVH), stroke/TIA, peripheral vascular disease, renal insufficiency, or hypertensive retinopathy.      Current Medications (verified): 1)  Potassium  2)  Toprol Xl 25 Mg Xr24h-Tab (Metoprolol Succinate) .... 1/2 Tab Once Daily 3)  Multivitamin 4)  Vitamin D 1000 Unit Tabs (Cholecalciferol) .... One By Mouth Bid 5)  Hydrochlorothiazide 25 Mg Tabs (Hydrochlorothiazide) .Marland Kitchen.. 1 Once Daily 6)  Asa 81mg   Allergies (verified): 1)  ! * Lisinopril 2)  ! * Diltiazem  Past History:  Past Medical History: HYPERGLYCEMIA (ICD-790.29) PANCREATITIS, HX OF (ICD-V12.70) G E R D (ICD-530.81) Anxiety Disorder Hypertension Hx. of Tracheostomy Hx. Resp. Failure Chronic  Cough ERCP(08/08/04) Hypokalemia Depression  Past Surgical History: Reviewed history from 04/01/2007 and no changes required. posr ERCP Appendectomy Breast Surgery: reduction Cholecystectomy Total Abdominal Hysterectomy Hammer Toe Surgery Tubal Ligation Intestinal repair(twisted intestine)1999  Family History: Reviewed history from 06/01/2008 and no changes required. Family History Diabetes 1st degree relative Family History Hypertension  Social History: Reviewed history from 06/01/2008 and no changes required. 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  The patient denies abdominal pain and hematuria.    Physical Exam  General:  alert, well-developed, well-nourished, well-hydrated, healthy-appearing, cooperative to examination, good hygiene, and overweight-appearing.   Head:  normocephalic and atraumatic.   Mouth:  Oral mucosa and oropharynx without lesions or exudates.  Teeth in good repair. Neck:  supple, full ROM, no masses, and no JVD.   Lungs:  Normal respiratory effort, chest expands symmetrically. Lungs are clear to auscultation, no crackles or wheezes. Heart:  Normal rate and regular rhythm. S1 and S2 normal without gallop, murmur, click, rub or other extra sounds. Abdomen:  Bowel sounds positive,abdomen soft and non-tender without masses, organomegaly or hernias noted. Msk:  No deformity or scoliosis noted of thoracic or lumbar spine.   Pulses:  R and L carotid,radial,femoral,dorsalis pedis and posterior tibial pulses are full and equal bilaterally Extremities:  1+ left pedal edema and 1+ right pedal edema.   Neurologic:  No cranial nerve deficits noted. Station and gait are normal. Plantar reflexes are down-going bilaterally. DTRs are symmetrical throughout. Sensory, motor and coordinative functions appear intact. Skin:  turgor  normal, color normal, no rashes, and tattoo(s).   Cervical Nodes:  No lymphadenopathy noted Psych:  Oriented X3, memory intact for recent and remote, good eye contact, not anxious appearing, not agitated, not suicidal, not homicidal, depressed affect, and tearful.     Impression & Recommendations:  Problem # 1:  DEPRESSION (ICD-311) Assessment Deteriorated  Her updated medication list for this problem includes:    Cymbalta 30 Mg Cpep (Duloxetine hcl) ..... Once daily  Problem # 2:  HYPERTENSION (ICD-401.9) Assessment: Improved  Her updated medication list for this problem includes:    Toprol Xl 25 Mg Xr24h-tab (Metoprolol succinate) .Marland Kitchen... 1/2 tab once daily    Hydrochlorothiazide 25 Mg Tabs (Hydrochlorothiazide) .Marland Kitchen... 1 once daily  Orders: TLB-BMP (Basic Metabolic Panel-BMET) (80048-METABOL) Venipuncture (16109)  Problem # 3:  HYPOKALEMIA (ICD-276.8) Assessment: Unchanged  Orders: TLB-BMP (Basic Metabolic Panel-BMET) (80048-METABOL) Venipuncture (60454)  Complete Medication List: 1)  Potassium  2)  Toprol Xl 25 Mg Xr24h-tab (Metoprolol succinate) .... 1/2 tab once daily 3)  Multivitamin  4)  Vitamin D 1000 Unit Tabs (Cholecalciferol) .... One by mouth bid 5)  Hydrochlorothiazide 25 Mg Tabs (Hydrochlorothiazide) .Marland Kitchen.. 1 once daily 6)  Asa 81mg   7)  Cymbalta 30 Mg Cpep (Duloxetine hcl) .... Once daily  Hypertension Assessment/Plan:      The patient's hypertensive  risk group is category A: No risk factors and no target organ damage.  Her calculated 10 year risk of coronary heart disease is 8 %.  Today's blood pressure is 130/80.  Her blood pressure goal is < 140/90.  Patient Instructions: 1)  Please schedule a follow-up appointment in 1 month. 2)  It is important that you exercise regularly at least 20 minutes 5 times a week. If you develop chest pain, have severe difficulty breathing, or feel very tired , stop exercising immediately and seek medical attention. 3)  You  need to lose weight. Consider a lower calorie diet and regular exercise.  4)  Check your Blood Pressure regularly. If it is above 140/90: you should make an appointment. Prescriptions: CYMBALTA 30 MG CPEP (DULOXETINE HCL) once daily  #42 x 0   Entered and Authorized by:   Etta Grandchild MD   Signed by:   Etta Grandchild MD on 07/13/2008   Method used:   Historical   RxID:   [id number]

## 2010-02-07 NOTE — Progress Notes (Signed)
Summary: Cymbalta samples  Phone Note Call from Patient Call back at Home Phone [phone]   Summary of Call: Patient has checked on her Cymbalta and is aware that it has been shipped, but she has not received it. The patient will run out of meds and wanted an prescription called in. I made patient aware to just stop by the office and pick up samples, #14 placed up front. Initial call taken by: Lucious Groves,  January 04, 2009 11:24 AM

## 2010-02-07 NOTE — Op Note (Signed)
Summary: [facility] placement[facility]   [facility] placement/[facility]   Imported By: Lester [place] 01/12/2009 10:38:09  _____________________________________________________________________  External Attachment:    Type:   Image     Comment:   External Document

## 2010-02-07 NOTE — Progress Notes (Signed)
Summary: hctz and klor con  Phone Note Call from Patient   Caller: Patient Summary of Call: pt called request refill on HCTZ and K [facility] [address]---will sent refills x 6/vg Initial call taken by: Tora Perches,  September 04, 2008 10:43 AM    New/Updated Medications: KLOR-CON 20 MEQ PACK (POTASSIUM CHLORIDE) once daily Prescriptions: HYDROCHLOROTHIAZIDE 25 MG TABS (HYDROCHLOROTHIAZIDE) 1 once daily  #30 x 6   Entered by:   Tora Perches   Authorized by:   Etta Grandchild MD   Signed by:   Tora Perches on 09/04/2008   Method used:   [facility]  [address] [id number]       [address]       [place], [place]  [address]       Ph: [phone] or [phone]       Fax: [phone]   RxID:   [id number] KLOR-CON 20 MEQ PACK (POTASSIUM CHLORIDE) once daily  #30 x 6   Entered by:   Tora Perches   Authorized by:   Etta Grandchild MD   Signed by:   Tora Perches on 09/04/2008   Method used:   [facility]        [facility]  [address] [id number]       [address]       [place], [place]  [address]       Ph: [phone] or [phone]       Fax: [phone]   RxID:   [id number]

## 2010-02-11 ENCOUNTER — Telehealth: Payer: Self-pay | Admitting: Internal Medicine

## 2010-02-21 NOTE — Progress Notes (Signed)
Summary: Cymbalta  Phone Note Call from Patient Call back at Work Phone [phone] Call back at [phone]   Summary of Call: Patient is requesting to know if it is ok to resume cymbalta 30mg ?   Rx to go to [facility]. (do NOT leave vm on wk #) Initial call taken by: Lamar Sprinkles, CMA,  February 11, 2010 10:10 AM    New/Updated Medications: CYMBALTA 30 MG CPEP (DULOXETINE HCL) One by mouth once daily Prescriptions: CYMBALTA 30 MG CPEP (DULOXETINE HCL) One by mouth once daily  #90 x 3   Entered and Authorized by:   Etta Grandchild MD   Signed by:   Etta Grandchild MD on 02/11/2010   Method used:   Printed then faxed to ...       [facility] (mail-order)             , [place]         Ph: [phone]       Fax: [phone]   RxID:   [id number]

## 2010-04-17 LAB — HM DIABETES EYE EXAM

## 2010-05-01 ENCOUNTER — Ambulatory Visit: Payer: Self-pay | Admitting: Internal Medicine

## 2010-05-21 NOTE — H&P (Signed)
[name], SORCE NO.:  [id number]   MEDICAL RECORD NO.:  [id number]          PATIENT TYPE:  INP   LOCATION:  2012                         FACILITY:  [facility]   PHYSICIAN:  Rollene Rotunda, MD, FACCDATE OF BIRTH:  [birth year]   DATE OF ADMISSION:  11/13/2007  DATE OF DISCHARGE:                              HISTORY & PHYSICAL   REASON FOR PRESENTATION:  Evaluate the patient with chest pain.   HISTORY OF PRESENT ILLNESS:  The patient is a lovely 55 year old African  American female who did see Dr. Meade Maw some years ago for  apparent chest discomfort with palpitations.  She describes a stress  nuclear study which was negative.  She had not seen a cardiologist in  some time.  She has had no cardiovascular issues or other cardiovascular  testing.  Today while resting, she did develop chest discomfort.  This  was substernal.  It was dull.  It was 6/10 at its peak.  There was some  radiation to her shoulder and around posterior to the scapula.  She felt  like her ears were stopped up.  She did not describe jaw discomfort.  She did not have associated nausea, vomiting, or diaphoresis.  She did  not have any palpitations.  She was not particularly short of breath.  The discomfort did not go away and so she went to urgent care.  There  she was found to have T-wave inversions in the inferior and lateral  leads.  However, these were unchanged, it turns out from 2008 EKG.  She  was transferred to [facility] and we are consulted.  In the interim, the  patient's pain is eased by itself.  She says she had similar symptoms  interestingly when she was hypokalemic in the past.  She is actually  hypokalemic today.  She is not particularly active though she recently  walked up 3 flights of stairs to visit  her daughter.  She was winded  when she got to the top, but this was not unusual.  She has not had any  new chest discomfort, neck or arm discomfort with exertion.  She has  not  had any resting shortness of breath, denies any PND, or orthopnea.  She  has not had any palpitation, presyncope, or syncope.   PAST MEDICAL HISTORY:  Hypertension since 1999, respiratory failure,  ARDS, and tracheotomy.  Following an ERCP and pancreatitis,  depression/anxiety, sleep apnea (although, she was told she did not need  a CPAP any longer), gastroesophageal reflux disease, obesity, asthma  (Dr. Sherene Sires has seen her and doubts this diagnosis), diverticulosis.   PAST SURGICAL HISTORY:  Cholecystectomy, hysterectomy, tubal ligation.   ALLERGIES:  None listed.   MEDICATIONS:  Proventil, Advair, atenolol, hydrochlorothiazide.   SOCIAL HISTORY:  The patient is married.  She has 4 children.  She does  not smoke cigarettes.  She works in Cur[employer] and listens to phone calls.   FAMILY HISTORY:  Noncontributory for early coronary artery disease.   REVIEW OF SYSTEMS:  As stated in the HPI and otherwise negative  for all  other systems except constipation.   PHYSICAL EXAMINATION:  GENERAL:  The patient is pleasant and in no  distress.  VITAL SIGNS:  Blood pressure 103/63, heart rate 75 and regular,  respiratory rate 18.  HEENT:  Eyes unremarkable.  Pupils equal, round, and reactive to light,  fundi not visualized, oral mucosa unremarkable.  NECK:  No jugular venous distention at 45 degrees, carotid upstroke  brisk and symmetric, no bruits, no thyromegaly.  LYMPHATICS:  No cervical, axillary, or inguinal adenopathy.  LUNGS:  Clear to auscultation bilaterally.  BACK:  No costovertebral angle tenderness.  CHEST:  Unremarkable.  HEART:  PMI not displaced or sustained, S1 and S2 within normal, no S3,  no S4, no clicks, no rubs, no murmurs.  ABDOMEN:  Morbidly obese, positive bowel sounds, normal in frequency and  pitch, no bruits, no rebound, no guarding, no midline pulsatile mass.  No hepatomegaly, no splenomegaly.  SKIN:  No rashes, no nodules.   EXTREMITIES:  A 2+ pulses throughout, no edema, no cyanosis, no  clubbing.  NEUROLOGIC:  Oriented to person, place, and time, cranial nerves II-XII  grossly intact, motor grossly intact.   EKG sinus rhythm, rate 69, axis within normal limits, intervals within  normal limits, inferior and lateral T-wave inversions unchanged from  previous EKGs.   LABORATORY:  WBC 9.6, hemoglobin 12.6, platelets 294, sodium 142,  potassium 3.1, BUN 11, creatinine 1.0.  Chest x-ray, probable bilateral  basilar scarring versus atelectasis, although they could not exclude  infiltrate.   ASSESSMENT AND PLAN:  1. Chest, the patient's chest discomfort is predominately atypical.  I      think the pretest probability is obstructive coronary disease, it      is somewhat low moderate.  However, given the risk factors and the      abnormal EKG, she will be observed overnight to rule out myocardial      infarction.  If her enzymes are negative, I would suggest      evaluation with a stress perfusion study.  It could be done as an      outpatient.  She will have cardiac enzymes cycled and an EKG in the      a.m.  2. Hypertension.  She will continue hydrochlorothiazide.  3. Hypokalemia.  She will be supplemented with potassium.  I would      suggest home p.o. potassium regimen when she is discharged.  4. Asthma.  She can continue her previous medications.      Rollene Rotunda, MD, [facility]  Electronically Signed     JH/MEDQ  D:  11/13/2007  T:  11/14/2007  Job:  [id number]

## 2010-05-21 NOTE — Assessment & Plan Note (Signed)
[facility]                         GASTROENTEROLOGY OFFICE NOTE   Sandy, Reyes                        MRN:          [id number]  DATE:09/14/2006                            DOB:          [birth year]    [name] complains of an intermittent dull aching left upper quadrant  pain that does not appear to be related to any particular activity,  meals, or any other digestive function.  She has been treated by Dr.  Sherene Sires for possible GERD due to a refractory cough.  She was on Aciphex  for a few months and apparently had relief of her cough.  She notes that  her left upper quadrant discomfort was not present while she was on  Aciphex.  She has occasional vomiting but notes no dysphagia,  odynophagia, change in bowel habits, melena, or hematochezia.  She has  noted some mild constipation since beginning Wellbutrin.   CURRENT MEDICATIONS:  Listed on the chart, updated and reviewed.   MEDICATION ALLERGIES:  Listed.   PHYSICAL EXAMINATION:  GENERAL:  Overweight African American female in  no acute distress.  VITAL SIGNS:  Weight 285.8 pounds, blood pressure is 126/76, pulse 92  and regular.  HEENT:  Anicteric sclerae.  Oropharynx clear.  NECK:  Without thyromegaly or adenopathy appreciated.  CHEST:  Clear to auscultation bilaterally.  CARDIAC:  Regular rate and rhythm without murmurs.  ABDOMEN:  Soft, nontender, nondistended.  Normoactive bowel sounds.  No  palpable organomegaly, masses, or hernias.   ASSESSMENT/PLAN:  1. Possible gastroesophageal reflux disease and gastritis.  Resume      Aciphex 20 mg by mouth every morning along with standard antireflux      measures.  Obtain a basic metabolic panel, hepatic function panel,      and lipase today.  Return office visit in 6 weeks.  2. Diverticulosis and mild constipation.  High fiber diet with      increased fluid intake.  May use a mild laxative such as milk of      magnesia or MiraLax on an as  needed basis.  Recall colonoscopy for      colorectal cancer screening in October 2016.     Venita Lick. Russella Dar, MD, [facility]  Electronically Signed    MTS/MedQ  DD: 09/14/2006  DT: 09/14/2006  Job #: [id number]

## 2010-05-24 NOTE — Discharge Summary (Signed)
NAMEMARDY, HOPPE NO.:  [id number]   MEDICAL RECORD NO.:  [id number]          PATIENT TYPE:  INP   LOCATION:  2012                         FACILITY:  [facility]   PHYSICIAN:  Elmore Guise., M.D.DATE OF BIRTH:  03-30-1955   DATE OF ADMISSION:  11/13/2007  DATE OF DISCHARGE:  11/14/2007                               DISCHARGE SUMMARY   DISCHARGE DIAGNOSES:  1. Chest pain.  2. Morbid obesity.  3. History of hypertension.  4. History of reactive airway disease.   HISTORY OF PRESENT ILLNESS:  Ms. Czerwinski is a very pleasant 55 year old  African American female who was admitted with chest pain.   HOSPITAL COURSE:  The patient's hospital course was uncomplicated.  She  ruled out for myocardial infarction.  She became chest pain-free while  transferred to the emergency room.  She required no further pain  medicine.  Her cardiac markers all were negative.  Comparison of her EKG  with her prior ECGs showed no significant changes.  She has been up and  ambulatory without any significant problems.  Her telemetry monitor  showed sinus rhythm with no evidence of arrhythmia.  She will be  discharged home today to continue the following medications.  1. Toprol-XL 25 mg daily (this is a new tablet for her).  2. Aspirin 81 mg daily.  3. Hydrochlorothiazide 25 mg daily.  4. Advair Diskus as needed.  5. Proventil inhaler as needed.  6. Potassium supplement once daily as before.   If she has any further problems, she is to call the office with any  questions or concerns.  The office will notify her to schedule her for  an outpatient stress test at her convenience.  All her questions were  answered.  I did tell her to stop her atenolol since she is on Toprol  and she reports past problems with atenolol.      Elmore Guise., M.D.  Electronically Signed     TWK/MEDQ  D:  11/15/2007  T:  11/15/2007  Job:  [id number]

## 2010-05-24 NOTE — Discharge Summary (Signed)
NAMELACEY, [name]               ACCOUNT NO.:  [id number]   MEDICAL RECORD NO.:  [id number]          PATIENT TYPE:  INP   LOCATION:  1413                         FACILITY:  [facility]   PHYSICIAN:  Theone Stanley, MD   DATE OF BIRTH:  [birth year]   DATE OF ADMISSION:  10/14/2004  DATE OF DISCHARGE:  10/18/2004                                 DISCHARGE SUMMARY   ADMISSION DIAGNOSES:  1.  Abdominal pain.  2.  Hypertension.  3.  Recent admission for endoscopic retrograde cholangiopancreatography,      which subsequently resulted in pancreatitis, intubation, tracheostomy,      and had a long period in the hospital.  She was discharged in stable      condition at that time.   DISCHARGE DIAGNOSES:  1.  Partial small bowel obstruction.  2.  Hypertension.   CONSULTATIONS:  Dr. Russella Dar.   PROCEDURES/DIAGNOSTIC TESTS:  Patient had a CT scan of the abdomen on  October 10th, which showed dilated loops of small bowel with air/fluid  levels.  Mild mesenteric edema in the pelvis but no overt pelvic ascites.  Biliary stent in place.  Reduced phlegmon in the region of the pancreatic  tail.   HOSPITAL COURSE:  [name] is a very pleasant 55 year old female who  presented with worsening abdominal pain over the past 12 hours prior to  admission.  She had persistent nausea and vomiting and presented to the  emergency room.  It was noted that she had a potassium of 2.8 with normal  amylase and lipase with a white count of 14.  A CT of the abdomen and pelvis  was performed which showed dilated loops of the small bowel.  At that time,  in the ER, because of her persistent nausea and vomiting, an NG tube was  placed.  The patient was admitted for small bowel obstruction.  The patient  had the NG for two days.  She had decreased output.  This was discontinued.  She was started on a clear-liquid diet.  She was able to tolerate that.  She  was advanced to a regular diet.  She was able to tolerate that.   She had no  more nausea or vomiting.  She had a bowel movement.  It was felt that  because of the resolution of her small bowel obstruction, she would be  discharged.  Patient was discharged in stable condition, improved.   DISCHARGE MEDICATIONS:  1.  Triamterene/hydrochlorothiazide 3.5 mg/25 1 p.o. daily.  2.  Premarin 0.625 mg 1 p.o. daily.  3.  Toprol XL 50 mg 1 p.o. daily.     Theone Stanley, MD  Electronically Signed    AEJ/MEDQ  D:  10/18/2004  T:  10/18/2004  Job:  [id number]

## 2010-05-24 NOTE — H&P (Signed)
[name], [name]               ACCOUNT NO.:  [id number]   MEDICAL RECORD NO.:  [id number]          PATIENT TYPE:  AMB   LOCATION:  ENDO                         [facility]ACILITY:  [facility]   PHYSICIAN:  [name], M.D. [facility] OF BIRTH:  [birth year]   DATE OF ADMISSION:  08/08/2004  DATE OF DISCHARGE:                                HISTORY & PHYSICAL   CHIEF COMPLAINT:  Common bile duct stones and recurrent epigastric pain.   HISTORY:  [name] is a pleasant 55 year old African-American female with  history of one episode of severe epigastric pain in May 2006 which took her  to the emergency room with pain, nausea and vomiting. CMET was unremarkable  at that time and a subsequent CT scan of the abdomen and pelvis showed prior  cholecystectomy and intrahepatic and extrahepatic biliary ductal dilation.  She was also found to have a UTI and treated at that time. She was seen by  Dr. Russella Reyes in July 2006, being referred for follow-up of a polyp noted on  rectal exam per Dr. Ambrose Reyes, but on review and discussing with the patient,  she had mentioned the recurrent upper abdominal discomfort and says that she  is still having daily discomfort though no severe pain and no further  episodes of severe discomfort. Review of the CT scan was concerning for  possible common bile duct stones and MRCP was ordered. This was done on August 03, 2004 showing marked ductal dilation and numerous common bile duct  stones, and a stone in the common hepatic duct as well. The patient has been  having some persistent epigastric soreness but no severe pain; no nausea,  vomiting, etc. She is scheduled today to undergo ERCP, sphincterotomy, and  stone extraction, and will be admitted for overnight observation.   CURRENT MEDICATIONS:  1.  Toprol-XL 50 mg daily.  2.  Hydrochlorothiazide 25 daily.  3.  Norvasc 5 daily.  4.  Zoloft 100 mg q.h.s.   ALLERGIES:  No known drug allergies.   PAST HISTORY:  Pertinent for:  1.   Hypertension.  2.  She is status post cholecystectomy in 1999.  3.  Status post total abdominal hysterectomy in 1992 and bilateral tubal      ligation in 1988.  4.  Appendectomy in 1999.  5.  History of small bowel obstruction in 1999.   FAMILY HISTORY:  Is negative for GI.   SOCIAL HISTORY:  The patient is married, has four children. She is employed  in Clinical biochemist. No tobacco, no EtOH.   REVIEW OF SYSTEMS:  CARDIOVASCULAR:  Denies any chest pain or anginal  symptoms. PULMONARY:  Negative for cough, shortness of breath, sputum  production. GENITOURINARY:  Negative for dysuria, urgency or frequency. GI:  As above.   PHYSICAL EXAMINATION:  GENERAL:  Well-developed African-American female in  no acute distress.  VITAL SIGNS:  Pulse in the 70s, blood pressure 128/70, temperature is 97,  respirations 16.  HEENT:  Nontraumatic, normocephalic. EOMI, PERLA. Sclerae anicteric.  CARDIOVASCULAR:  Regular rhythm with S1 and S2. No murmur, rub, or gallop.  PULMONARY:  Clear  to A&P.  ABDOMEN:  Large, soft, is minimally tender in the epigastrium. There is no  guarding or rebound. No mass or hepatosplenomegaly. Bowel sounds are active.  RECTAL:  Exam is not done today.  EXTREMITIES:  Without clubbing, cyanosis or edema.   LABORATORY STUDIES:  Done August 06, 2004:  Liver function studies normal.   IMPRESSION:  1.  55 year old female with numerous common bile duct stones and      intrahepatic duct stone on MRCP.  2.  Status post cholecystectomy in 1999.  3.  Hypertension.  4.  Status post appendectomy.  5.  History of small bowel obstruction in 1989.  6.  Status post total abdominal hysterectomy.   PLAN:  ERCP with stone extraction today and then plan for overnight  admission for observation postprocedure. For details please see the orders.       AE/MEDQ  D:  08/08/2004  T:  08/08/2004  Job:  [id number]   cc:   [name], M.D.  [address]  [place]  [address]  Fax: [phone]   [name], M.D.  [address]  [place]  [place] [address]  Fax: [phone]

## 2010-05-24 NOTE — Consult Note (Signed)
NAMENEKESHA, FONT NO.:  [id number]   MEDICAL RECORD NO.:  [id number]          PATIENT TYPE:  INP   LOCATION:  0156                         FACILITY:  [facility]   PHYSICIAN:  Antony Contras, MD     DATE OF BIRTH:  Oct 31, 1955   DATE OF CONSULTATION:  08/27/2004  DATE OF DISCHARGE:                                   CONSULTATION   CHIEF COMPLAINT:  Respiratory failure, requesting tracheotomy.   HISTORY OF PRESENT ILLNESS:  The patient is a 55 year old African-American  female with a past medical history significant for severe sleep apnea, who  underwent an ERCP on August 3, and a stent was placed at that time.  After  the procedure, she developed pancreatitis and ultimately required  endotracheal intubation on August 8th due to respiratory failure after an  attempt with CPAP to control her respiratory decline.  She has remained on  the ventilator since that time and has been unable to be weaned from the  ventilator.  Her primary team now requests tracheotomy for airway  management.  Her background of sleep apnea was diagnosed on July 31 with a  sleep study that revealed an RDI of 51.7 and resolution with 14 cm of water  pressure of CPAP via a nasal mask.   PAST MEDICAL HISTORY:  1.  Hypertension.  2.  Obstructive sleep apnea.  3.  Depression/anxiety.   PAST SURGICAL HISTORY:  1.  Appendectomy.  2.  Hysterectomy.  3.  Cholecystectomy.   HOME MEDICATIONS:  1.  Toprol XL.  2.  Hydrochlorothiazide.  3.  Norvasc.  4.  Zoloft.   ALLERGIES:  No known drug allergies.   FAMILY HISTORY:  Unable to be obtained.   SOCIAL HISTORY:  The patient is married, although her husband has been out  of the picture for the last few years but has returned during this  hospitalization.  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   REVIEW OF SYSTEMS:  Unable to be obtained.   PHYSICAL EXAMINATION:  VITAL SIGNS:  Temperature 100.4, pulse 102,  blood  pressure 135/65, respirations 30, sat 96%.  GENERAL:  Ms. Clavin is sedated on a ventilator via endotracheal intubation.  She does respond to pain with grimacing.  She is obese and has a short neck  with difficult-to-palpate landmarks.  This is probably due to her position  in the bed.  HEENT:  Facial exam is unremarkable.  Oral cavity/oropharynx exam is  obstructed by the endotracheal tube.   ASSESSMENT:  Ms. Doni Bacha is a 55 year old African-American female with  respiratory failure post ERCP pancreatitis.  Also with obesity and severe  obstructive sleep apnea, previously shown to be treatable with CPAP.  For  airway management, a tracheotomy has been recommended to the husband, and he  has given consent after risks were discussed, including bleeding, infection,  collapsed lung, airway compromise, and tracheocutaneous fistula.  In doing  this tracheotomy, due to her obstructive sleep apnea and neck anatomy, we  will perform an H. flap tracheotomy.  I plan  to leave the tracheotomy tube  in place until a repeat sleep study can be performed on CPAP with the trach  tube capped.  In the meantime, the tracheotomy tube will serve as a  treatment for the sleep apnea.  The procedure will be scheduled later this  week.           ______________________________  Antony Contras, MD     DDB/MEDQ  D:  08/27/2004  T:  08/27/2004  Job:  [id number]

## 2010-05-24 NOTE — H&P (Signed)
NAMELONETTA, BLASSINGAME NO.:  [id number]   MEDICAL RECORD NO.:  [id number]          PATIENT TYPE:  INP   LOCATION:  0102                         FACILITY:  [facility]   PHYSICIAN:  Hollice Espy, M.D.DATE OF BIRTH:  [birth year]   DATE OF ADMISSION:  10/15/2004  DATE OF DISCHARGE:                                HISTORY & PHYSICAL   PRIMARY CARE PHYSICIAN:  Leanne Chang, M.D.   CHIEF COMPLAINT:  Abdominal pain.   The patient is a 55 year old white female with past medical history of  hypertension as well as an appendectomy and gallbladder surgery  approximately 15 years ago, who presents with worsening abdominal pain over  the last 12 hours.  She started having persistent nausea and vomiting and  presented to the emergency room.  There, she was noted to have a potassium  level of 2.8, a normal amylase and lipase level, but a white count of 14.5,  with 81% shift, and a CT of the abdomen and pelvis was done which showed  dilated loops of small bowel, some perihepatic inflammation, with a possible  ileus versus small bowel obstruction.  The patient was made n.p.o.  She  started having more nausea and vomiting, and an NG tube was placed, which  provided her with some relief.  Currently, she states she is feeling a  little bit better.  She denies any headaches, vision changes, dysphagia,  chest pain, palpitations, shortness of breath, wheeze, or cough.  She still  complains of some mild abdominal discomfort, although improved with the NG  tube.  She has no diarrhea.  She has not moved her bowels she says in  approximately 1-2 days.  She denies any hematuria or dysuria.  No focal  extremity numbness, weakness, or pain.  Her review of systems is otherwise  negative.   PAST MEDICAL HISTORY:  1.  Hypertension.  2.  Appendectomy at the same time as her cholecystectomy done in the late      1980s or early 1990s.   MEDICATIONS:  She is on Toprol; hydrochlorothiazide; and  Premarin.   ALLERGIES:  She is allergic to LISINOPRIL.   SOCIAL HISTORY:  She denies any tobacco, alcohol, or drug use.   FAMILY HISTORY:  Noncontributory.   PHYSICAL EXAMINATION:  VITAL SIGNS:  Temperature 98.3; heart rate 92; blood  pressure 114/73; respirations 22; O2 saturation 92% on room air.  GENERAL:  The patient is alert and oriented x3, in minimal distress  secondary to abdominal discomfort.  HEENT:  Normocephalic, atraumatic.  She has a nasogastric tube in.  HEART:  Regular rate and rhythm, S1, S2.  LUNGS:  Clear to auscultation bilaterally.  ABDOMEN:  Soft, slightly distended.  Mildly tender.  No bowel sounds.  EXTREMITIES:  Showed no clubbing or cyanosis.  Trace pitting edema.   LABORATORY WORK:  Sodium 140, potassium 2.8, chloride 101, bicarbonate 29,  BUN 4, creatinine 0.6, glucose 116.  LFTs are unremarkable, except for an  albumin of 3.4.  Lipase 19, amylase 43.  CBC:  White count 14.5, H&H 12.2  and 36.2, MCV 86, platelet  count 356, 81% neutrophils.   ASSESSMENT AND PLAN:  1.  Small bowel obstruction.  Will make the patient n.p.o.  Continue NG      tube.  Put on IV Reglan and Protonix, medications for nausea and pain,      and follow up clinically.  If there is no improvement, will get a      surgery consult.  2.  Hypertension.  Hold p.o. medications.  Put on IV Lopressor.  3.  Hypokalemia secondary to nausea and vomiting.  I have placed IV KCl in      her IV fluids.      Hollice Espy, M.D.  Electronically Signed     SKK/MEDQ  D:  10/15/2004  T:  10/15/2004  Job:  [id number]   cc:   Leanne Chang, M.D.  Fax: [phone]

## 2010-05-24 NOTE — Assessment & Plan Note (Signed)
[facility]                             PULMONARY OFFICE NOTE   [name], [name]                        MRN:          [id number]  DATE:12/10/2005                            DOB:          11/18/1955    HISTORY:  A 55 year old black female last seen here for refractory cough  following an illness characterized as ARDS related to pancreatitis and  with complete resolution of cough while on high dose PPI therapy.  Subsequently she was seen by Dr. Russella Dar who noticed that she also had  atypical abdominal pains that he thought might be related to reflux and  this had actually occurred after she had stopped the PPI therapy which I  had recommended she maintain indefinitely.  She comes in today having  again stopped PPI therapy awhile ago with no flare up of any symptoms  until severe coughing began two days ago associated with a scratchy  throat but and no low grade fever, generalized headache with no itching,  sneezing, overt reflux symptoms, purulent secretions, chest pain, or  significant dyspnea.  Did have dyspnea associated with wheezing for  which she was seen in an Urgent Care and started on Advair, but is no  better today.   PHYSICAL EXAMINATION:  GENERAL:  She is an uncomfortable but certainly  not toxic appearing, ambulatory black female who is mildly hoarse but in  no acute distress.  VITAL SIGNS:  Temperature 100 here with a blood pressure of 116/72.  HEENT:  Unremarkable.  Oropharynx is actually clear.  There is no  evidence of erythema, excessive post nasal drainage or cobblestoning.  Dentition intact.  Ear canals clear bilaterally.  Nasal turbinates  revealed mild edema with no purulent secretions.  LUNGS:  Lung fields were perfectly clear bilaterally to auscultation and  percussion except for prominent pseudo wheeze.  HEART:  Regular rhythm without murmur, gallop or rub.  ABDOMEN:  Soft, benign.  EXTREMITIES:  Warm without calf  tenderness, cyanosis or clubbing or  edema.   LABORATORY DATA:  Chest x-ray was performed last night at Urgent Care  and reported to show no pneumonia.   IMPRESSION:  1. This patient has not had typical asthma and is not likely to      respond to Advair which may actually irrigate the upper airway more      than it helps the lower airway.  I recommended that she go back to      using Proventil p.r.n. after she assured me that she has not      required Proventil and recommended treatment directed at the upper      airway, which is exactly where we left off on her last visit here.      Namely, whenever she has any upper airway symptoms at all, I would      like her to treat herself consistently with PPI in some form; in      this particular case, Zegerid 40 mg at bedtime and will give her a      six-day course of prednisone along with a seven-day  course of      doxycycline for what may be either a nonspecific or viral      chlamydial upper respiratory infection.  To treat her      symptomatically, I reviewed in writing the mechanism of cyclical      coughing and the importance of treating reflux because of its      central role in reflux and cyclical coughing.  To treat wheezing      and dyspnea, she can use Proventil two puffs every four hours as      long as she does not require more than two puffs weekly on a      chronic basis, but to treat cough Delsym two teaspoons b.i.d.  For      severe cough and/or headache, recommend tramadol 30 mg one q. 4 h.      p.r.n.  For stuffy head, recommend Advil Cold and Sinus.   I have advised the patient to return here if not 100% back to baseline  within 72 hours.   I have also advised the patient because of her previous history of ARDS  as well as morbid obesity, she has a limited ventilatory reserve and  should go ahead and tread herself on her next exacerbation, regardless  of whether on obvious viral infection, cold, sinus etc. with reflux   medications as outlined above and also reviewed with her in writing  contingency plan and an appropriate diet for reflux. However, whether  she maintains long term PPI therapy or not. I will defer to Dr. Russella Dar as  his previous recommendation was only to take it for four to six weeks,  not to maintain on indefinitely.   Parenthetically, I would add that the patients who are prone to reflux  tend to have significant exacerbations once they develop URIs because  the effect on the epithelial barrier against reflux is long and because  coughing creates more reflux of both acid and nonacid, irritating the  altered epithelial barrier.     Charlaine Dalton. Sherene Sires, MD, [facility]  Electronically Signed    MBW/MedQ  DD: 12/10/2005  DT: 12/11/2005  Job #: [id number]   cc:   Leanne Chang, M.D.

## 2010-05-24 NOTE — Discharge Summary (Signed)
NAMELAKITA, SAHLIN NO.:  [id number]   MEDICAL RECORD NO.:  [id number]          PATIENT TYPE:  INP   LOCATION:  0161                         FACILITY:  [facility]   PHYSICIAN:  Oley Balm. Sung Amabile, M.D. [facility] OF BIRTH:  [birth year]   DATE OF ADMISSION:  08/08/2004  DATE OF DISCHARGE:  09/17/2004                                 DISCHARGE SUMMARY   DISCHARGE DIAGNOSES:  1.  Hypoxic respiratory failure.  2.  Pulmonary edema and acute lung injury secondary to pancreatitis.  3.  Gallbladder disease and pancreatitis status post ERCP.  4.  Hyperglycemia.  5.  Agitated delirium.  6.  Resolving systemic inflammatory response syndrome.   PROCEDURE:  1.  ERCP August 08, 2004. FINDINGS:  Multiple stones in the common bile duct      measuring anywhere from 6-10 mm. She underwent common bile duct dilation      with stent placement for obstruction, by Dr. Russella Dar.  2.  Left PICC line placed August 6, removed August 25.  3.  Oral endotracheal tube placed August 8, removed August 25.  4.  Right radial A line placed August 8, removed 10.  5.  Right subclavian triple lumen catheter placed August 15, removed August      25.  6.  Tracheostomy [facility]) placed August 25, removed September 6.  7.  __________ placed September 6, discontinued September 9 (decannulated).  8.  PICC line placed September 2, removed September 9.  9.  Tracheostomy placed by Dr. Christia Reading on August 30, 2004.   LABORATORY DATA:  Blood cultures September 08, 2004 negative. BAL date  September 08, 2004, staph aureus, enterobacter cloacae, both with multiple  sensitivities. Date September 13, 2004, retic count 0.9, RBC 3.48. Date  September 11, 2004, sodium 140, potassium 3.7, chloride 103, CO2 28, glucose  136, BUN 13, creatinine 0.6, calcium 9.3. Date September 11, 2004, white  blood cells 12.3, hemoglobin 10.6, hematocrit 32.3, platelets 363. Date  September 08, 2004 urine culture is negative. Date September 09, 2004,  prealbumin 33.1. Date September 08, 2004, amylase 105, lipase 96.   BRIEF HISTORY:  Ms. Rivenbark was admitted to the [facility] on August 08, 2004. She is a 55 year old African-American female  with a history of morbid obesity and gallbladder disease. She has had  previous cholecystostomy and recurrent gallbladder disease with stones and  continued epigastric distress. She was admitted on August 09, 2004 after an  ERCP on August 08, 2004, primarily for overnight observations. Subsequently  developed hypokalemia 2.6 which required repletion. Hypokalemia and  significant abdominal pain. Her oxygen saturations decreased to the 70's  despite 2 liters nasal cannula oxygen. She had a sleep study on July 31  which demonstrated sleep apnea which O2 dropping at night on oxygen. She was  placed in the intensive care for O2 desaturations after ERCP with resultant  pancreatitis.   PAST MEDICAL HISTORY:  Significant for gallbladder disease and  cholecystectomy. Additionally, past medical history with hypertension,  depression/anxiety,and appendectomy.   HOSPITAL COURSE BY PROBLEM:  #1.  HYPOXIC RESPIRATORY FAILURE/ACUTE  LUNG  INJURY SECONDARY TO SYSTEMIC INFLAMMATORY RESPONSE SYNDROME SECONDARY TO  ACUTE PANCREATITIS. Because of the nature of her critical illness and her  respiratory failure, Ms. Breon was transitioned to the Pulmonary Critical  Care Team on August 12, 2004. Upon further evaluation, she was noted to have  a fairly large left pleural effusion which she did undergo left  thoracentesis on August 12, 2004. This fluid was sent for culture and was  negative. She was trialed on CPAP to assist with oxygenation and given her  history of oxygen dependence and OSA. Her respiratory status continued to  deteriorate to the point where she required endotracheal intubation on  September 13, 2004 completed by anesthesiology. Following endotracheal  intubation, Ms. Gauthier was  treated in the usual critical care fashion with  initiations of the GI protocol, DVT prophylaxis with Lovenox, ventilator  protective lung strategy with low tidal volumes, sedation protocol and  sliding scale insulin protocol. She was ventilated at 6 mL per kg secondary  to diffuse lung injury and prior chest x-ray. Due to the resultant systemic  inflammatory response and hypotension from the __________ syndrome, Ms.  Karman did require aggressive volume resuscitation and occasional vasoactive  drips to maintain her pressure during her critical illness. She was  maintained on full ventilator support. She did have recurrent episodes of  fever during her hospitalization. Her only positive cultures were previously  mentioned on September 08, 2004 by BAL demonstrating staph aureus and  enterobacter. She was not treated at that time with antibiotics. At that  time, however, she had received several empiric antibiotics during the  course of her hospitalization including Unasyn from August 3 to August 9,  Zosyn August 9 to August 14 with vancomycin. Maxipime from August 14 to  August 31, Flagyl from August 14 to August 31 and fluconazole from August 22  to August 30. Again the only positive cultures were noted on September 08, 2004 and at that point she had no further sequelae without treatment. Her  respiratory status continued to improve as well as her underlying systemic  inflammatory response syndrome. She underwent tracheostomy on the above  noted time. She was trached on August 30, 2004. Weaning was initiated  shortly after tracheostomy but was complicated by agitation and delirium.  This was treated in the usual fashion with the addition of Klonopin and  Seroquel. She continued to improve over the course of her hospitalization.  She was later liberated from full ventilator support, and subsequently down  sized in trache size and eventually decannulated on September 13, 2004. She did undergo a  swallowing evaluation on September 13, 2004 with modified  barium swallow which showed normal swallowing function. Upon time of  admission, her indicators are resolved with the exception of a low grade  fever at 99.1. She is no longer tachycardic. Her blood pressure in the 140's  to 80's. She is tolerating a diet without abdominal pain and states she is  ready for discharge. It should be noted that Ms. Yochim did have an  abdominal CT completed on September 16, 2004 which demonstrates an  endobiliary stent which remains in place with stable slightly smaller  intrahepatic ducts which now contain air. There is evidence that the changes  of pancreatitis are resolving. She has bilateral lower lobe atelectasis with  pneumonia which is somewhat improved. Pelvic CT without contrast without  focal masses or adenopathy or fluid collection.  #2.  GALLBLADDER DISEASE WITH RESULTANT PANCREATITIS.  Ms.  Angelos as  previously mentioned underwent ERCP by Dr. Russella Dar on August 08, 2004. She  underwent ductal dilation with the findings of multiple stones in the common  bile duct. She underwent ductal dilation of the common bile duct to the  common hepatic duct with stent placement. It should be noted that Ms. Balcom  still has active gallbladder disease and will need further GI followup in  regards to treatment of the gallbladder stones. This will be followed up by  Dr. Russella Dar in the outpatient setting, there has been some mention about  further evaluation and treatment in a tertiary facility in regards to this  problem.  #3.  IRON DEFICIENCY ANEMIA.  Diagnosed on August 16, 2004 with saturations  of 7%. The patient was placed on anemia protocol while in intensive care as  well as Epo protocol. This was discontinued but she will be sent home on  ferrous sulfate.  #4.  AGITATED DELIRIUM TREATED PRIMARILY WITH P.R.N. ANTIPSYCHOTIC MEDS AND  DIAZEPINES. Weaned off over the course of her hospitalization,  completely  resolved at time of discharge.  #5.  HYPERGLYCEMIA THOUGHT TO BE SECONDARY TO STOMACH INFLAMMATORY RESPONSE  SYNDROME.  Did require IV insulin as well as sliding scale insulin. This is  resolved again at time of discharge.   DISCHARGE INSTRUCTIONS:  1.  Diet--low fat diet.  2.  Wound care--change trache dressings daily with loose sterile gauze.   MEDICATIONS:  1.  Toprol XL 50 mg tab daily.  2.  Hydrochlorothiazide 25 mg daily.  3.  Zoloft 100 mg before bed.  4.  Ferrous sulfate 325 tabs 3 times a day.   FOLLOW UP:  1.  Dr. Sherene Sires September 28 at 2:15 p.m.  2.  Dr. Russella Dar on Monday, September 25 at 11:15 p.m.   PHYSICAL EXAMINATION AT DISCHARGE:  VITAL SIGNS:  Temperature 99.1. Heart  rate 80's, blood pressure 140/70, room air saturations 96%, respirations 18-  20 breaths per minutes.  ENT:  Tracheostomy site with moderate yellow colored tracheal discharge. No  adenopathy or pain. No JVD.  PULMONARY:  Clear but diminished in the bases bilaterally. HEART:  Tones regular rate and rhythm.  ABDOMEN:  Soft, nontender, tolerating diet.  EXTREMITIES:  Without edema.   DISPOSITION:  The patient is currently ready for discharge and states she  understands her discharge planning goals. She will be discharged with home  health assistance and evaluation. Again she will need further GI followup in  regards to her biliary stones.      Anders Simmonds, N.P. [facility]    ______________________________  Oley Balm Sung Amabile, M.D. [facility]    PB/MEDQ  D:  09/17/2004  T:  09/17/2004  Job:  [id number]   cc:   Charlaine Dalton. Sherene Sires, M.D. [facility]  [address]  [place]  [place] [address]   Venita Lick. Russella Dar, M.D. [facility]   Antony Contras, MD

## 2010-05-24 NOTE — Procedures (Signed)
[name], [name]               ACCOUNT NO.:  [id number]   MEDICAL RECORD NO.:  [id number]          PATIENT TYPE:  OUT   LOCATION:  SLEEP CENTER                 FACILITY:  [facility]   PHYSICIAN:  Clinton D. Maple Hudson, M.D. DATE OF BIRTH:  1955/05/10   DATE OF STUDY:  08/05/2004                              NOCTURNAL POLYSOMNOGRAM   REFERRING PHYSICIAN:  Dr. Luretha Murphy   DATE OF STUDY:  August 05, 2004   INDICATION FOR STUDY:  Hypersomnia with sleep apnea.  Epworth Sleepiness  Score 5/24, BMI 46, weight 264 pounds.   SLEEP ARCHITECTURE:  Total sleep time 384 minutes with sleep efficiency 90%.  Stage I was 5%, stage II 57%, stages III and IV 1%, REM 37% of total sleep  time.  Sleep latency 19 minutes, REM latency 112 minutes, awake after sleep  onset 23 minutes, arousal index 21.  No bedtime medication taken.   RESPIRATORY DATA:  Split-study protocol.  Respiratory Disturbance Index  (RDI, AHI) 51.7 obstructive events per hour indicating moderately severe  obstructive sleep apnea/hypopnea syndrome before CPAP.  This included 50  obstructive apneas and 81 hypopneas before CPAP.  Events were not  positional.  REM RDI 27.  CPAP was titrated to 14 CWP, RDI 0 per hour, using  a medium Respironics ComfortLite 2 Nasal Mask with heated humidifier.   OXYGEN DATA:  Moderate snoring with oxygen desaturation to a nadir of 69%  before CPAP.  After CPAP control saturation held 93-97% on room air.   CARDIAC DATA:  Normal sinus rhythm.   MOVEMENT/PARASOMNIA:  A total of 139 limb jerks were recorded of which 13  were associated with arousal or awakening for a periodic limb movement with  arousal index of 2 per hour which is minimally increased and probably  insignificant.   IMPRESSION/RECOMMENDATION:  1.  Moderately severe obstructive sleep apnea/hypopnea syndrome, Respiratory      Disturbance Index 51.7 per hour with moderate snoring and oxygen      desaturation to 69%.  2.  Successful continuous  positive airway pressure titration to 14 CWP,      Respiratory Disturbance Index 0 per hour, using a medium Respironics      ComfortLite 2 Nasal Mask with heated humidifier.      Clinton D. Maple Hudson, M.D.  [employer]    CDY/MEDQ  D:  08/11/2004 09:18:20  T:  08/11/2004 20:30:36  Job:  [id number]

## 2010-05-24 NOTE — Op Note (Signed)
[name], [name] NO.:  [id number]   MEDICAL RECORD NO.:  [id number]          PATIENT TYPE:  INP   LOCATION:  0156                         [facility]ACILITY[facility]   PHYSICIAN:  Sandy Contras, MD     DATE OF BIRTH:  08-20-1955   DATE OF PROCEDURE:  08/30/2004  DATE OF DISCHARGE:                                 OPERATIVE REPORT   PREOPERATIVE DIAGNOSES:  1.  Respiratory failure.  2.  Obstructive sleep apnea.   POSTOPERATIVE DIAGNOSES:  1.  Respiratory failure.  2.  Obstructive sleep apnea.   PROCEDURE:  Tracheotomy with Bjork flap.   SURGEON:  Sandy Contras, MD   ASSISTANT:  Sandy Cowboy, MD   ANESTHESIA:  General endotracheal anesthesia.   COMPLICATIONS:  None.   INDICATIONS:  The patient is a 55 year old African-American female who  developed respiratory failure as a result of severe pancreatitis following  an ERCP.  She has required endotracheal mechanical ventilation for about two  weeks and is not weaning off the ventilator quickly.  She had a sleep study  at the end of July that showed her to have severe obstructive sleep apnea  corrected on CPAP.  She presents to the operating room for surgical airway.   DESCRIPTION OF PROCEDURE:  The patient is identified in the ICU and informed  consent having been obtained, the patient was moved to the operative suite  and put on the table in supine position.  Anesthesia was maintained via  endotracheal anesthesia.  The patient was placed on a shoulder roll, and the  anterior neck exposed.  The neck was prepped and draped in a sterile  fashion.  A marking pen was used to mark landmarks, and an incision was made  in the lower neck in a horizontal fashion using cutting mode on Bovie  electrocautery.  Subcutaneous fat was then removed using Bovie  electrocautery.  The midline raphe of the strap muscles was identified and  divided, retracting in each direction laterally.  The thyroid isthmus was  then identified,  clamped, divided, and tied with 2-0 silk suture.  A  __________ hook was then placed in the cricoid cartilage and used to elevate  the larynx.  A 15 blade scalpel was used to make an incision between rings 2  and 3 on the anterior tracheal wall.  This was extended with scissors.  Bleeding was encountered after making the tracheal wall cuts, and this was  treated with electrocautery.  An inferior-based flap was then made in the  anterior tracheal wall, making cuts at each end of the horizontal incision  and in an inferior direction through the lower ring.  A 2-0 Vicryl suture  was then passed through the inferior skin and inferiorly based flap on  either side, creating the Bjork flap.  A 0 silk suture was placed round the  ring above the trach site of the stay suture.  Airway was suctioned and made  hemostatic.  The endotracheal tube cuff was then deflated and the tube  pulled back to above the trach site.  An 8 Shiley  trach tube with an inner  cannula and cuff was then placed into the tracheotomy site, and the cuff  inflated.  The inner cannula was placed, and the anesthesia circuit was  hooked to the trach tube, and the patient successfully ventilated.  The  Vicryl sutures were then tied down inferiorly, and a stay suture tied with  knots superiorly.  The trachea was then suctioned out, and ventilation  resumed.  A 0 silk suture was then used to secure the trach tube to the  anterior neck skin in four quadrants.  The neck was then cleaned off, and a  Velcro trach tie were added.  The patient was then moved back to the  intensive care unit in stable condition.           ______________________________  Sandy Contras, MD     DDB/MEDQ  D:  08/30/2004  T:  08/30/2004  Job:  [id number]   cc:   [name], M.D.

## 2010-05-24 NOTE — Assessment & Plan Note (Signed)
[facility]                           GASTROENTEROLOGY OFFICE NOTE   NAME:[name], [name]                        MRN:          [id number]  DATE:07/31/2005                            DOB:          12/26/1955    She complains of intermittent nagging left upper quadrant and epigastric  pain that is bothersome for about the past two months. Her symptoms began  shortly after discontinuing AcipHex. She was being treated for possible GERD  by Dr. Sherene Sires, and he recommended to discontinue the medication as he felt she  did not have GERD. She notes the pain occasionally starts after meals, but  also occurs at other times. She occasionally has some mild substernal  burning and bloating. She notes no dysphagia, odynophagia, weight loss or  gastrointestinal bleeding. She does note that Tylenol seems to improve her  pain as well.   CURRENT MEDICATIONS:  Listed on the chart have been reviewed.   MEDICATION ALLERGIES:  LISINOPRIL.   PHYSICAL EXAMINATION:  GENERAL:  Obese, no acute distress, weight 271.6  pounds, blood pressure 116/70, pulse 80 and regular.  HEENT:  Anicteric sclerae, oropharynx clear.  CHEST:  Clear to auscultation bilaterally.  CARDIAC:  Regular rate and rhythm without murmurs appreciated.  ABDOMEN:  Soft, very minimal epigastric tenderness to deep palpation. No  rebound or guarding. No palpable organomegaly, masses or hernias.  Normoactive bowel sounds.   ASSESSMENT:  Intermittent left upper quadrant and epigastric pain. Rule out  acid peptic symptoms, gastroesophageal reflux disease, and musculoskeletal  complaints. Trial of Prilosec 20 mg OTC one p.o. q.a.m. along with  antireflux measures for the next 4-6 weeks. Obtain CBC, C-MET, and lipase  today. Return office visit with me in 4-6 weeks.                                   Venita Lick. Pleas Koch., MD, Clementeen Graham   MTS/MedQ  DD:  07/31/2005  DT:  07/31/2005  Job #:  [id number]   cc:   Leanne Chang, MD

## 2010-06-13 ENCOUNTER — Ambulatory Visit (INDEPENDENT_AMBULATORY_CARE_PROVIDER_SITE_OTHER): Payer: 59 | Admitting: Internal Medicine

## 2010-06-13 ENCOUNTER — Emergency Department (HOSPITAL_COMMUNITY): Payer: 59

## 2010-06-13 ENCOUNTER — Encounter: Payer: Self-pay | Admitting: Internal Medicine

## 2010-06-13 ENCOUNTER — Ambulatory Visit (INDEPENDENT_AMBULATORY_CARE_PROVIDER_SITE_OTHER)
Admission: RE | Admit: 2010-06-13 | Discharge: 2010-06-13 | Disposition: A | Discharge status: Home / Self Care | Payer: 59 | Source: Ambulatory Visit | Attending: Internal Medicine | Admitting: Internal Medicine

## 2010-06-13 ENCOUNTER — Emergency Department (HOSPITAL_COMMUNITY)
Admission: EM | Admit: 2010-06-13 | Discharge: 2010-06-13 | Disposition: A | Discharge status: Home / Self Care | Payer: 59 | Attending: Emergency Medicine | Admitting: Emergency Medicine

## 2010-06-13 VITALS — BP 120/80 | HR 113 | Temp 98.7°F | Resp 16 | Wt 258.5 lb

## 2010-06-13 DIAGNOSIS — F3289 Other specified depressive episodes: Secondary | ICD-10-CM | POA: Insufficient documentation

## 2010-06-13 DIAGNOSIS — F329 Major depressive disorder, single episode, unspecified: Secondary | ICD-10-CM | POA: Insufficient documentation

## 2010-06-13 DIAGNOSIS — R10817 Generalized abdominal tenderness: Secondary | ICD-10-CM

## 2010-06-13 DIAGNOSIS — R10819 Abdominal tenderness, unspecified site: Secondary | ICD-10-CM | POA: Insufficient documentation

## 2010-06-13 DIAGNOSIS — I10 Essential (primary) hypertension: Secondary | ICD-10-CM | POA: Insufficient documentation

## 2010-06-13 DIAGNOSIS — R109 Unspecified abdominal pain: Secondary | ICD-10-CM | POA: Insufficient documentation

## 2010-06-13 DIAGNOSIS — Z049 Encounter for examination and observation for unspecified reason: Secondary | ICD-10-CM | POA: Insufficient documentation

## 2010-06-13 DIAGNOSIS — R110 Nausea: Secondary | ICD-10-CM | POA: Insufficient documentation

## 2010-06-13 NOTE — Assessment & Plan Note (Addendum)
Xray looks like an early SBO vs ileus so she will go to the ER for urgent work-up and IV fluids

## 2010-06-13 NOTE — Progress Notes (Signed)
  Subjective:    Patient ID: [name], female    DOB: 1955/09/22, 55 y.o.   MRN: [id number]  Abdominal Pain This is a recurrent problem. The current episode started yesterday. The onset quality is gradual. The problem occurs intermittently. The problem has been unchanged. The pain is located in the LLQ and RLQ. The pain is at a severity of 2/10. The pain is mild. The quality of the pain is aching. The abdominal pain does not radiate. Associated symptoms include anorexia, nausea and vomiting. Pertinent negatives include no arthralgias, belching, constipation, diarrhea, fever, flatus, frequency, headaches, hematochezia, melena, myalgias or weight loss. The pain is aggravated by nothing. The pain is relieved by nothing. She has tried proton pump inhibitors for the symptoms. The treatment provided no relief. Her past medical history is significant for abdominal surgery.      Review of Systems  Constitutional: Negative for fever and weight loss.  Gastrointestinal: Positive for nausea, vomiting, abdominal pain and anorexia. Negative for diarrhea, constipation, melena, hematochezia and flatus.  Genitourinary: Negative for frequency.  Musculoskeletal: Negative for myalgias and arthralgias.  Neurological: Negative for headaches.       Objective:   Physical Exam  Vitals reviewed. Constitutional: She is oriented to person, place, and time. She appears well-developed and well-nourished. No distress.  HENT:  Head: Normocephalic and atraumatic.  Right Ear: External ear normal.  Left Ear: External ear normal.  Nose: Nose normal.  Mouth/Throat: Oropharynx is clear and moist. No oropharyngeal exudate.  Eyes: Conjunctivae and EOM are normal. Pupils are equal, round, and reactive to light. Right eye exhibits no discharge. Left eye exhibits no discharge. No scleral icterus.  Neck: Normal range of motion. Neck supple. No JVD present. No tracheal deviation present. No thyromegaly present.  Cardiovascular:  Normal rate, regular rhythm, normal heart sounds and intact distal pulses.  Exam reveals no gallop and no friction rub.   No murmur heard. Pulmonary/Chest: Effort normal and breath sounds normal. Stridor present. No respiratory distress. She has no wheezes. She has no rales. She exhibits no tenderness.  Abdominal: Soft. Normal appearance. She exhibits no shifting dullness, no distension, no pulsatile liver, no fluid wave, no abdominal bruit, no ascites, no pulsatile midline mass and no mass. Bowel sounds are absent. There is no hepatosplenomegaly, splenomegaly or hepatomegaly. There is no tenderness. There is no rigidity, no rebound, no guarding, no CVA tenderness, no tenderness at McBurney's point and negative Murphy's sign. Hernia confirmed negative in the right inguinal area and confirmed negative in the left inguinal area.  Musculoskeletal: Normal range of motion. She exhibits no edema and no tenderness.  Lymphadenopathy:    She has no cervical adenopathy.  Neurological: She is alert and oriented to person, place, and time. She has normal reflexes.  Skin: Skin is warm and dry. No rash noted. She is not diaphoretic. No erythema. No pallor.  Psychiatric: She has a normal mood and affect. Her behavior is normal. Judgment and thought content normal.          Assessment & Plan:

## 2010-07-01 ENCOUNTER — Other Ambulatory Visit: Payer: Self-pay | Admitting: Cardiology

## 2010-10-07 ENCOUNTER — Other Ambulatory Visit: Payer: Self-pay | Admitting: Obstetrics and Gynecology

## 2010-10-07 DIAGNOSIS — Z1231 Encounter for screening mammogram for malignant neoplasm of breast: Secondary | ICD-10-CM

## 2010-10-08 LAB — POCT I-STAT, CHEM 8
BUN: 11
Calcium, Ion: 1.18
Chloride: 99
Creatinine, Ser: 1
Glucose, Bld: 107 — ABNORMAL HIGH
HCT: 40
Hemoglobin: 13.6
Potassium: 3.1 — ABNORMAL LOW
Sodium: 142
TCO2: 33

## 2010-10-08 LAB — TROPONIN I
Troponin I: 0.01
Troponin I: 0.01

## 2010-10-08 LAB — BASIC METABOLIC PANEL
BUN: 9
CO2: 30
Calcium: 9.2
Chloride: 102
Creatinine, Ser: 0.72
GFR calc Af Amer: >60
GFR calc non Af Amer: >60
Glucose, Bld: 91
Potassium: 3.5
Sodium: 140

## 2010-10-08 LAB — CBC
HCT: 38.3
Hemoglobin: 12.6
MCHC: 33
MCV: 87.9
Platelets: 294
RBC: 4.36
RDW: 14.9
WBC: 9.6

## 2010-10-08 LAB — CK TOTAL AND CKMB (NOT AT ARMC)
CK, MB: 1.4
CK, MB: 1.5
Relative Index: 0.6
Relative Index: 0.7
Total CK: 208 — ABNORMAL HIGH
Total CK: 250 — ABNORMAL HIGH

## 2010-10-08 LAB — POCT CARDIAC MARKERS
CKMB, poc: 1.3
Myoglobin, poc: 115
Troponin i, poc: <0.05

## 2010-10-08 LAB — DIFFERENTIAL
Basophils Absolute: 0
Basophils Relative: 1
Eosinophils Absolute: 0.2
Eosinophils Relative: 2
Lymphocytes Relative: 38
Lymphs Abs: 3.6
Monocytes Absolute: 0.5
Monocytes Relative: 5
Neutro Abs: 5.2
Neutrophils Relative %: 54

## 2010-11-03 ENCOUNTER — Other Ambulatory Visit: Payer: Self-pay | Admitting: Internal Medicine

## 2010-11-11 ENCOUNTER — Ambulatory Visit
Admission: RE | Admit: 2010-11-11 | Discharge: 2010-11-11 | Disposition: A | Discharge status: Home / Self Care | Payer: 59 | Source: Ambulatory Visit | Attending: Obstetrics and Gynecology | Admitting: Obstetrics and Gynecology

## 2010-11-11 DIAGNOSIS — Z1231 Encounter for screening mammogram for malignant neoplasm of breast: Secondary | ICD-10-CM

## 2010-11-11 LAB — HM MAMMOGRAPHY: HM Mammogram: NORMAL

## 2011-01-08 ENCOUNTER — Other Ambulatory Visit: Payer: Self-pay

## 2011-01-08 MED ORDER — DULOXETINE HCL 30 MG PO CPEP: 30.0000 mg | ORAL_CAPSULE | Freq: Every day | ORAL | Status: DC

## 2011-01-09 ENCOUNTER — Other Ambulatory Visit: Payer: Self-pay | Admitting: *Deleted

## 2011-01-09 LAB — HM PAP SMEAR: HM Pap smear: NORMAL

## 2011-01-09 MED ORDER — DULOXETINE HCL 30 MG PO CPEP: 30.0000 mg | ORAL_CAPSULE | Freq: Every day | ORAL | Status: DC

## 2011-01-09 NOTE — Telephone Encounter (Signed)
Pt needs rx for Cymbalta sent to [facility] on [address]. Until she receives rx from [employer].

## 2011-01-10 ENCOUNTER — Telehealth: Payer: Self-pay

## 2011-01-10 MED ORDER — DULOXETINE HCL 30 MG PO CPEP: 30.0000 mg | ORAL_CAPSULE | Freq: Every day | ORAL | Status: DC

## 2011-01-10 NOTE — Telephone Encounter (Signed)
Refill request

## 2011-01-13 ENCOUNTER — Ambulatory Visit (INDEPENDENT_AMBULATORY_CARE_PROVIDER_SITE_OTHER): Payer: 59 | Admitting: Internal Medicine

## 2011-01-13 ENCOUNTER — Other Ambulatory Visit (INDEPENDENT_AMBULATORY_CARE_PROVIDER_SITE_OTHER): Payer: 59

## 2011-01-13 ENCOUNTER — Encounter: Payer: Self-pay | Admitting: Internal Medicine

## 2011-01-13 DIAGNOSIS — R0609 Other forms of dyspnea: Secondary | ICD-10-CM

## 2011-01-13 DIAGNOSIS — E559 Vitamin D deficiency, unspecified: Secondary | ICD-10-CM

## 2011-01-13 DIAGNOSIS — E876 Hypokalemia: Secondary | ICD-10-CM

## 2011-01-13 DIAGNOSIS — R7309 Other abnormal glucose: Secondary | ICD-10-CM

## 2011-01-13 DIAGNOSIS — R0683 Snoring: Secondary | ICD-10-CM | POA: Insufficient documentation

## 2011-01-13 DIAGNOSIS — I10 Essential (primary) hypertension: Secondary | ICD-10-CM

## 2011-01-13 LAB — CBC WITH DIFFERENTIAL/PLATELET
Basophils Absolute: 0 10*3/uL (ref 0.0–0.1)
Basophils Relative: 0.5 % (ref 0.0–3.0)
Eosinophils Absolute: 0.1 10*3/uL (ref 0.0–0.7)
Eosinophils Relative: 1.4 % (ref 0.0–5.0)
HCT: 39.3 % (ref 36.0–46.0)
Hemoglobin: 13 g/dL (ref 12.0–15.0)
Lymphocytes Relative: 24.3 % (ref 12.0–46.0)
Lymphs Abs: 2.1 10*3/uL (ref 0.7–4.0)
MCHC: 33.2 g/dL (ref 30.0–36.0)
MCV: 90.3 fl (ref 78.0–100.0)
Monocytes Absolute: 0.5 10*3/uL (ref 0.1–1.0)
Monocytes Relative: 5.7 % (ref 3.0–12.0)
Neutro Abs: 5.9 10*3/uL (ref 1.4–7.7)
Neutrophils Relative %: 68.1 % (ref 43.0–77.0)
Platelets: 249 10*3/uL (ref 150.0–400.0)
RBC: 4.35 Mil/uL (ref 3.87–5.11)
RDW: 14.1 % (ref 11.5–14.6)
WBC: 8.6 10*3/uL (ref 4.5–10.5)

## 2011-01-13 LAB — LIPID PANEL
Cholesterol: 186 mg/dL (ref 0–200)
HDL: 55.6 mg/dL (ref >39.00)
LDL Cholesterol: 111 mg/dL — ABNORMAL HIGH (ref 0–99)
Total CHOL/HDL Ratio: 3
Triglycerides: 98 mg/dL (ref 0.0–149.0)
VLDL: 19.6 mg/dL (ref 0.0–40.0)

## 2011-01-13 LAB — HM DIABETES FOOT EXAM: HM Diabetic Foot Exam: NORMAL

## 2011-01-13 LAB — TSH: TSH: 1.17 u[IU]/mL (ref 0.35–5.50)

## 2011-01-13 LAB — HEMOGLOBIN A1C: Hgb A1c MFr Bld: 5.6 % (ref 4.6–6.5)

## 2011-01-13 NOTE — Assessment & Plan Note (Signed)
Check her K+ level today

## 2011-01-13 NOTE — Assessment & Plan Note (Signed)
BP is well controlled, check lytes and renal function today

## 2011-01-13 NOTE — Assessment & Plan Note (Signed)
Vit D level today to see if she has maintained a normal level s/p gastric banding one year ago

## 2011-01-13 NOTE — Progress Notes (Signed)
Subjective:    Patient ID: [name], female    DOB: Apr 29, 1955, 56 y.o.   MRN: [id number]  Hypertension This is a chronic problem. The current episode started more than 1 year ago. The problem is unchanged. The problem is controlled. Associated symptoms include malaise/fatigue. Pertinent negatives include no anxiety, blurred vision, chest pain, headaches, neck pain, orthopnea, palpitations, peripheral edema, PND, shortness of breath or sweats. Past treatments include diuretics. The current treatment provides significant improvement. Compliance problems include exercise and diet.  Identifiable causes of hypertension include sleep apnea.      Review of Systems  Constitutional: Positive for malaise/fatigue, fatigue and unexpected weight change (30 pound weight gain over the last year). Negative for fever, chills, diaphoresis, activity change and appetite change.  HENT: Negative for congestion, sore throat, facial swelling, rhinorrhea, sneezing, trouble swallowing, neck pain, neck stiffness, voice change, postnasal drip and sinus pressure.   Eyes: Negative.  Negative for blurred vision.  Respiratory: Positive for apnea (and heavy snoring). Negative for cough, chest tightness, shortness of breath, wheezing and stridor.   Cardiovascular: Negative for chest pain, palpitations, orthopnea, leg swelling and PND.  Gastrointestinal: Negative for nausea, vomiting, abdominal pain, diarrhea, constipation and blood in stool.  Genitourinary: Negative for dysuria, urgency, frequency, hematuria, decreased urine volume, difficulty urinating and dyspareunia.  Musculoskeletal: Negative for myalgias, back pain, joint swelling, arthralgias and gait problem.  Skin: Negative for color change, pallor, rash and wound.  Neurological: Negative for dizziness, tremors, seizures, syncope, facial asymmetry, speech difficulty, weakness, light-headedness, numbness and headaches.  Hematological: Negative for adenopathy. Does  not bruise/bleed easily.  Psychiatric/Behavioral: Positive for sleep disturbance (frequent awakenings). Negative for suicidal ideas, hallucinations, behavioral problems, self-injury, dysphoric mood, decreased concentration and agitation. The patient is not nervous/anxious and is not hyperactive.        Objective:   Physical Exam  Vitals reviewed. Constitutional: She is oriented to person, place, and time. She appears well-developed and well-nourished. No distress.  HENT:  Head: Normocephalic and atraumatic.  Mouth/Throat: Oropharynx is clear and moist. No oropharyngeal exudate.  Eyes: Conjunctivae are normal. Right eye exhibits no discharge. Left eye exhibits no discharge. No scleral icterus.  Neck: Normal range of motion. Neck supple. No JVD present. No tracheal deviation present. No thyromegaly present.  Cardiovascular: Normal rate, regular rhythm, normal heart sounds and intact distal pulses.  Exam reveals no gallop and no friction rub.   No murmur heard. Pulmonary/Chest: Effort normal and breath sounds normal. No stridor. No respiratory distress. She has no wheezes. She has no rales. She exhibits no tenderness.  Abdominal: Soft. Bowel sounds are normal. She exhibits no distension and no mass. There is no tenderness. There is no rebound and no guarding.  Musculoskeletal: Normal range of motion. She exhibits no edema and no tenderness.  Lymphadenopathy:    She has no cervical adenopathy.  Neurological: She is oriented to person, place, and time.  Skin: Skin is warm and dry. No rash noted. She is not diaphoretic. No erythema. No pallor.  Psychiatric: She has a normal mood and affect. Her behavior is normal. Judgment and thought content normal.      Lab Results  Component Value Date   WBC 7.9 10/18/2009   HGB 12.9 10/18/2009   HCT 38.2 10/18/2009   PLT 255.0 10/18/2009   GLUCOSE 82 01/01/2010   CHOL 165 06/01/2008   TRIG 74.0 06/01/2008   HDL 46.60 06/01/2008   LDLCALC 104*  06/01/2008   ALT 23 10/18/2009   AST 23  10/18/2009   NA 140 01/01/2010   K 3.4* 01/01/2010   CL 99 01/01/2010   CREATININE 0.6 01/01/2010   BUN 11 01/01/2010   CO2 31 01/01/2010   TSH 1.04 10/18/2009   HGBA1C 5.9 10/18/2009      Assessment & Plan:

## 2011-01-13 NOTE — Assessment & Plan Note (Signed)
She has developed s/s of OSA and the complications associated with this, I have asked her to see sleep medicine for an evaluation

## 2011-01-13 NOTE — Patient Instructions (Signed)

## 2011-01-13 NOTE — Assessment & Plan Note (Signed)
a1c today to see if she has developed DM II 

## 2011-01-14 LAB — COMPREHENSIVE METABOLIC PANEL
ALT: 20 U/L (ref 0–35)
AST: 20 U/L (ref 0–37)
Albumin: 4 g/dL (ref 3.5–5.2)
Alkaline Phosphatase: 94 U/L (ref 39–117)
BUN: 10 mg/dL (ref 6–23)
CO2: 32 mEq/L (ref 19–32)
Calcium: 9.3 mg/dL (ref 8.4–10.5)
Chloride: 101 mEq/L (ref 96–112)
Creatinine, Ser: 0.6 mg/dL (ref 0.4–1.2)
GFR: 126.02 mL/min (ref >60.00)
Glucose, Bld: 94 mg/dL (ref 70–99)
Potassium: 3.6 mEq/L (ref 3.5–5.1)
Sodium: 141 mEq/L (ref 135–145)
Total Bilirubin: 0.6 mg/dL (ref 0.3–1.2)
Total Protein: 7.7 g/dL (ref 6.0–8.3)

## 2011-01-17 ENCOUNTER — Encounter: Payer: Self-pay | Admitting: Internal Medicine

## 2011-01-17 LAB — VITAMIN D 1,25 DIHYDROXY
Vitamin D 1, 25 (OH)2 Total: 70 pg/mL (ref 18–72)
Vitamin D2 1, 25 (OH)2: <8 pg/mL
Vitamin D3 1, 25 (OH)2: 70 pg/mL

## 2011-01-22 ENCOUNTER — Encounter: Payer: Self-pay | Admitting: Pulmonary Disease

## 2011-01-22 ENCOUNTER — Ambulatory Visit (INDEPENDENT_AMBULATORY_CARE_PROVIDER_SITE_OTHER): Payer: 59 | Admitting: Pulmonary Disease

## 2011-01-22 VITALS — BP 122/88 | HR 90 | Temp 98.5°F | Ht 63.0 in | Wt 273.0 lb

## 2011-01-22 DIAGNOSIS — G4733 Obstructive sleep apnea (adult) (pediatric): Secondary | ICD-10-CM | POA: Insufficient documentation

## 2011-01-22 NOTE — Patient Instructions (Signed)
Schedule sleep study Your problem of insomnia may be related to menopausal symptoms

## 2011-01-22 NOTE — Progress Notes (Signed)
  Subjective:    Patient ID: [name], female    DOB: 06/01/1955, 56 y.o.   MRN: [id number]  HPI PCP - [name] 55/f, referred for management of obstructive sleep apnea  She reports being unable to go back to sleep once wakes up to use restroom. She has post void latency of upto 2 h on occasion.  PSG '06  (Wt 264)>> severe OSA with AHI 52/h including 50 obstructive apneas & 81 hypopneas corrected by 14 cm , nadir desatn to 69%. She required ERCP for acute pancreatitis foll by resp failure requiring tstomy  Lap band sx in dec'10 Jan '12 band flipped, required surgery, Had lost 60, gained 30 lbs back Bedtime 10-11 pm, TV stays on throught he night, latency is 30 min, 1 awakening for BR visit around 0130, oob at 0600, feeling tied, no dryness or headaches. TSH nml She underwent TAH + BSO in '92 ESS 3/24 There is no history suggestive of cataplexy, sleep paralysis or parasomnias    Review of Systems  Constitutional: Negative for fever and unexpected weight change.  HENT: Negative for ear pain, nosebleeds, congestion, sore throat, rhinorrhea, sneezing, trouble swallowing, dental problem, postnasal drip and sinus pressure.   Eyes: Negative for redness and itching.  Respiratory: Negative for cough, chest tightness, shortness of breath and wheezing.   Cardiovascular: Negative for palpitations and leg swelling.  Gastrointestinal: Negative for nausea and vomiting.  Genitourinary: Negative for dysuria.  Musculoskeletal: Negative for joint swelling.  Skin: Negative for rash.  Neurological: Negative for headaches.  Hematological: Does not bruise/bleed easily.  Psychiatric/Behavioral: Positive for dysphoric mood. The patient is not nervous/anxious.        Objective:   Physical Exam  Gen. Pleasant, obese, in no distress, normal affect ENT - no lesions, no post nasal drip, class 2-3 airway, healed Tstomy scar Neck: No JVD, no thyromegaly, no carotid bruits Lungs: no use of accessory  muscles, no dullness to percussion, decreased without rales or rhonchi  Cardiovascular: Rhythm regular, heart sounds  normal, no murmurs or gallops, no peripheral edema Abdomen: soft and non-tender, no hepatosplenomegaly, BS normal. Musculoskeletal: No deformities, no cyanosis or clubbing Neuro:  alert, non focal, no tremors       Assessment & Plan:

## 2011-01-22 NOTE — Assessment & Plan Note (Addendum)
PSG '06  (Wt 264)>> severe OSA with AHI 52/h including 50 obstructive apneas & 81 hypopneas corrected by 14 cm , nadir desatn to 69%. Given excessive daytime somnolence, narrow pharyngeal exam, witnessed apneas & loud snoring, obstructive sleep apnea is very likely & an overnight polysomnogram will be scheduled as a split study. The pathophysiology of obstructive sleep apnea , it's cardiovascular consequences & modes of treatment including CPAP were discused with the patient in detail & they evidenced understanding. Based on prior sleep study , will re-initiate CPAP at 14 cm with  Nasal mask  Weight loss encouraged, compliance with goal of at least 4-6 hrs every night is the expectation. Advised against medications with sedative side effects Cautioned against driving when sleepy - understanding that sleepiness will vary on a day to day basis

## 2011-01-23 ENCOUNTER — Encounter (HOSPITAL_BASED_OUTPATIENT_CLINIC_OR_DEPARTMENT_OTHER): Payer: 59

## 2011-02-11 ENCOUNTER — Ambulatory Visit (HOSPITAL_BASED_OUTPATIENT_CLINIC_OR_DEPARTMENT_OTHER): Payer: 59 | Attending: Pulmonary Disease | Admitting: Radiology

## 2011-02-11 VITALS — Ht 63.0 in | Wt 269.0 lb

## 2011-02-11 DIAGNOSIS — G4733 Obstructive sleep apnea (adult) (pediatric): Secondary | ICD-10-CM

## 2011-02-11 DIAGNOSIS — E669 Obesity, unspecified: Secondary | ICD-10-CM | POA: Insufficient documentation

## 2011-02-11 DIAGNOSIS — G4730 Sleep apnea, unspecified: Secondary | ICD-10-CM

## 2011-02-16 DIAGNOSIS — G4733 Obstructive sleep apnea (adult) (pediatric): Secondary | ICD-10-CM

## 2011-02-16 DIAGNOSIS — E669 Obesity, unspecified: Secondary | ICD-10-CM

## 2011-02-17 NOTE — Procedures (Signed)
NAMEKRISTEENA, [name] NO.:  [id number]  MEDICAL RECORD NO.:  [id number]          PATIENT TYPE:  OUT  LOCATION:  SLEEP CENTER                 FACILITY:  [facility]  PHYSICIAN:  Oretha Milch, MD      DATE OF BIRTH:  [birth year]  DATE OF STUDY:  02/11/2011                           NOCTURNAL POLYSOMNOGRAM  REFERRING PHYSICIAN:  Oretha Milch, MD  INDICATION FOR STUDY:  Ms. Litzinger is a 56 year old obese woman with obstructive sleep apnea.  Polysomnogram in 2006 showed severe OSA with AHI of 52 per hour, corrected by 14 cm of CPAP and a desaturation to 69%.  Her weight then was 264 pounds.  At the time of this study, she weighed 269 pounds with a height of 5 feet 3 inches.  BMI of 48.  Neck size of 15.5 inches.  EPWORTH SLEEPINESS SCORE:  3.  MEDICATIONS:  Bedtime medications, none.  This intervention polysomnogram was performed with a sleep technologist in attendance.  EEG, EOG, EMG, EKG, and respiratory parameters were recorded.  Sleep stages, arousals, limb movements, and respiratory data were scored according to criteria laid out by the American Academy of Sleep Medicine.  SLEEP ARCHITECTURE:  Lights out was at 11:04 a.m.  Lights on was at 5:44 a.m.  CPAP was initiated at 1:52 a.m.  During the diagnostic portion, total sleep time was 145 minutes with a sleep period time of 164 minutes and sleep efficiency of 87%.  Sleep latency was 4 minutes and REM sleep was not noted.  During the titration portion, an hour of REM sleep was noted.  Longest period of REM sleep was around 4 a.m.  Sleep stages as percentage of total sleep time was N1 19%, N2 81%, and supine sleep accounted for 46 minutes.  RESPIRATORY DATA:  During the diagnostic portion, there were total of 16 obstructive apneas, 0 central apneas, 0 mixed apneas, and 120 hypopneas, and apnea-hypopnea index of 56 events per hour and the lowest desaturation of 81%.  Due to this degree of respiratory  disturbance, CPAP was initiated 5 cm and titrated to a final level of 16 cm.  At a level of 15 cm for 74 minutes of sleep including 17 minutes of REM sleep, 2 obstructive apneas, 1 mixed apnea, and 2 hypopneas were noted with an AHI of 4 events per hour and the lowest desaturation of 87%. This appears to be the optimal level used during the study.  At a level of 16 cm for 11.5 minutes, 1 central apnea was noted.  AROUSAL DATA:  During the diagnostic portion, there were 95 arousals with an arousal index of 39 events per hour.  During the titration portion, the arousal index was 14 events per hour.  OXYGEN DATA:  The lowest desaturation during the diagnostic portion was 81%.  In the titration portion, she spent 1.3 minutes with a saturation less than 88%.  CARDIAC DATA:  The low heart rate was 35 beats per minute.  The high heart rate recorded was an artifact.  MOVEMENT-PARASOMNIA:  DISCUSSION:  She was desensitized with a small full-face Quattro mask. She met the split criteria and was placed on CPAP with humidity  and a C- flex setting of +2 cm.  She tolerated the CPAP very well.  IMPRESSIONS: 1. Severe obstructive sleep apnea with hypopneas causing sleep     problems, fragmentation, and oxygen desaturation. 2. This was corrected by an optimal CPAP of 15 cm with a small full-     face mask. 3. No evidence of cardiac arrhythmias, limb movements, or behavioral     disturbance during sleep.  RECOMMENDATIONS: 1. Treatment options with this degree of sleep disorder breathing     include CPAP therapy and weight loss. 2. CPAP should be initiated at 15 cm with a small full-face mask and     compliance monitored at this level. 3. She should be cautioned against driving when sleepy.  She should be     asked to avoid medications with sedative side effects.     Oretha Milch, MD    RVA/MEDQ  D:  02/16/2011 14:36:47  T:  02/17/2011 16:10:96  Job:  [id number]

## 2011-02-26 ENCOUNTER — Encounter: Payer: Self-pay | Admitting: Pulmonary Disease

## 2011-02-26 ENCOUNTER — Ambulatory Visit (INDEPENDENT_AMBULATORY_CARE_PROVIDER_SITE_OTHER): Payer: 59 | Admitting: Pulmonary Disease

## 2011-02-26 VITALS — BP 120/82 | HR 86 | Temp 98.7°F | Ht 62.0 in | Wt 274.2 lb

## 2011-02-26 DIAGNOSIS — G4733 Obstructive sleep apnea (adult) (pediatric): Secondary | ICD-10-CM

## 2011-02-26 NOTE — Progress Notes (Signed)
  Subjective:    Patient ID: [name], female    DOB: 10/13/1955, 56 y.o.   MRN: [id number]  HPI PCP - [name]   55/f,  for FU of obstructive sleep apnea  She reports being unable to go back to sleep once wakes up to use restroom. She has post void latency of upto 2 h on occasion.  PSG '06 (Wt 264)>> severe OSA with AHI 52/h including 50 obstructive apneas & 81 hypopneas corrected by 14 cm , nadir desatn to 69%.  She required ERCP for acute pancreatitis foll by resp failure requiring tstomy  Lap band sx in dec'10  Jan '12 band flipped, required surgery, Had lost 60, gained 30 lbs back  Bedtime 10-11 pm, TV stays on throught he night, latency is 30 min, 1 awakening for BR visit around 0130, oob at 0600, feeling tied, no dryness or headaches.  TSH nml  She underwent TAH + BSO in '92  ESS 3/24  02/26/2011 At the time of this study, she weighed 269 pounds with a height of 5 feet 3 inches. BMI of 48. Neck  size of 15.5 inches. Severe obstructive sleep apnea with hypopneas AHI 56/h causing sleep problems, fragmentation, and oxygen desaturation. This was corrected by an optimal CPAP of 15 cm with a small full-  face mask, C flex 2. She was contacted by [name] but wanted to hold off on cpap until PSG    Review of Systems Patient denies significant dyspnea,cough, hemoptysis,  chest pain, palpitations, pedal edema, orthopnea, paroxysmal nocturnal dyspnea, lightheadedness, nausea, vomiting, abdominal or  leg pains      Objective:   Physical Exam  Gen. Pleasant, obese, in no distress ENT - no lesions, no post nasal drip, class 2 airway Neck: No JVD, no thyromegaly, no carotid bruits Lungs: no use of accessory muscles, no dullness to percussion, decreased without rales or rhonchi  Cardiovascular: Rhythm regular, heart sounds  normal, no murmurs or gallops, no peripheral edema Musculoskeletal: No deformities, no cyanosis or clubbing , no tremors       Assessment & Plan:

## 2011-02-26 NOTE — Assessment & Plan Note (Signed)
PSG 1/13 >> AHI 56/H corrected by CPAP 15 cm, small full face mask chk download in 4 wks Weight loss encouraged, compliance with goal of at least 4-6 hrs every night is the expectation. Advised against medications with sedative side effects Cautioned against driving when sleepy - understanding that sleepiness will vary on a day to day basis

## 2011-02-26 NOTE — Patient Instructions (Signed)
Get started on CPAP machine - 15 cm with small full face mask Turn in the card before your next appt

## 2011-03-02 ENCOUNTER — Other Ambulatory Visit: Payer: Self-pay | Admitting: Internal Medicine

## 2011-04-07 ENCOUNTER — Other Ambulatory Visit: Payer: Self-pay

## 2011-04-07 MED ORDER — DULOXETINE HCL 30 MG PO CPEP: 30.0000 mg | ORAL_CAPSULE | Freq: Every day | ORAL | Status: DC

## 2011-04-14 ENCOUNTER — Ambulatory Visit (INDEPENDENT_AMBULATORY_CARE_PROVIDER_SITE_OTHER): Payer: 59 | Admitting: Pulmonary Disease

## 2011-04-14 ENCOUNTER — Encounter: Payer: Self-pay | Admitting: Pulmonary Disease

## 2011-04-14 VITALS — BP 132/76 | HR 119 | Temp 98.4°F | Ht 63.0 in | Wt 282.2 lb

## 2011-04-14 DIAGNOSIS — G4733 Obstructive sleep apnea (adult) (pediatric): Secondary | ICD-10-CM

## 2011-04-14 NOTE — Progress Notes (Signed)
  Subjective:    Patient ID: [name], female    DOB: 1955-09-10, 56 y.o.   MRN: [id number]  HPI PCP - [name]   55/f, for FU of obstructive sleep apnea  She reports being unable to go back to sleep once wakes up to use restroom. She has post void latency of upto 2 h on occasion.  PSG '06 (Wt 264)>> severe OSA with AHI 52/h including 50 obstructive apneas & 81 hypopneas corrected by 14 cm , nadir desatn to 69%.  She required ERCP for acute pancreatitis foll by resp failure requiring tstomy  Lap band sx in dec'10  Jan '12 band flipped, required surgery, Had lost 60, gained 30 lbs back   TSH nml  She underwent TAH + BSO in '92  ESS 3/24   02/26/2011  At the time of this study, she weighed 269 pounds with a height of 5 feet 3 inches. BMI of 48. Neck  size of 15.5 inches.  Severe obstructive sleep apnea with hypopneas AHI 56/h causing sleep problems, fragmentation, and oxygen desaturation. This was corrected by an optimal CPAP of 15 cm with a small full- face mask, C flex 2.    04/14/2011 Pt states she wears her cpap machine everynight x 6-7 hrs night. pt states when she wakes up her mouth is dry. She states she still wakes up about once a night Download [name]) 2/28- 04/07/11 shows good compliance, residual Ahi 3/h, 30 m ins avg leak daily  On 15 cm  Review of Systems Patient denies significant dyspnea,cough, hemoptysis,  chest pain, palpitations, pedal edema, orthopnea, paroxysmal nocturnal dyspnea, lightheadedness, nausea, vomiting, abdominal or  leg pains      Objective:   Physical Exam  Gen. Pleasant, obese, in no distress ENT - no lesions, no post nasal drip Neck: No JVD, no thyromegaly, no carotid bruits Lungs: no use of accessory muscles, no dullness to percussion, decreased without rales or rhonchi  Cardiovascular: Rhythm regular, heart sounds  normal, no murmurs or gallops, no peripheral edema Musculoskeletal: No deformities, no cyanosis or clubbing , no  tremors        Assessment & Plan:

## 2011-04-14 NOTE — Assessment & Plan Note (Signed)
  Ct cpap 15 cm - good compliance Increase humidity setting Weight loss encouraged, compliance with goal of at least 4-6 hrs every night is the expectation. Advised against medications with sedative side effects Cautioned against driving when sleepy - understanding that sleepiness will vary on a day to day basis Discussed care of cpap & goals

## 2011-04-14 NOTE — Patient Instructions (Signed)
OK to increase humidity setting to 4 The cpap machine is working well for you

## 2011-05-13 ENCOUNTER — Encounter: Payer: Self-pay | Admitting: Internal Medicine

## 2011-05-13 ENCOUNTER — Ambulatory Visit (INDEPENDENT_AMBULATORY_CARE_PROVIDER_SITE_OTHER): Payer: 59 | Admitting: Internal Medicine

## 2011-05-13 VITALS — BP 118/70 | HR 94 | Temp 98.5°F | Resp 16 | Ht 63.0 in | Wt 280.2 lb

## 2011-05-13 DIAGNOSIS — I10 Essential (primary) hypertension: Secondary | ICD-10-CM

## 2011-05-13 DIAGNOSIS — R7309 Other abnormal glucose: Secondary | ICD-10-CM

## 2011-05-13 MED ORDER — PHENTERMINE HCL 37.5 MG PO TABS: 37.5000 mg | ORAL_TABLET | Freq: Every day | ORAL | Status: DC

## 2011-05-13 NOTE — Assessment & Plan Note (Signed)
She can't expand her Lap-band anymore and asks that she try diet pills so I wrote an Rx for adipex-p

## 2011-05-13 NOTE — Assessment & Plan Note (Signed)
Her BP is well controlled 

## 2011-05-13 NOTE — Patient Instructions (Signed)
Obesity Obesity is defined as having a body mass index (BMI) of 30 or more. To calculate your BMI divide your weight in pounds by your height in inches squared and multiply that product by 703. Major illnesses resulting from long-term obesity include:  Stroke.   Heart disease.   Diabetes.   Many cancers.   Arthritis.  Obesity also complicates recovery from many other medical problems.  CAUSES   A history of obesity in your parents.   Thyroid hormone imbalance.   Environmental factors such as excess calorie intake and physical inactivity.  TREATMENT  A healthy weight loss program includes:  A calorie restricted diet based on individual calorie needs.   Increased physical activity (exercise).  An exercise program is just as important as the right low-calorie diet.  Weight-loss medicines should be used only under the supervision of your physician. These medicines help, but only if they are used with diet and exercise programs. Medicines can have side effects including nervousness, nausea, abdominal pain, diarrhea, headache, drowsiness, and depression.  An unhealthy weight loss program includes:  Fasting.   Fad diets.   Supplements and drugs.  These choices do not succeed in long-term weight control.  HOME CARE INSTRUCTIONS  To help you make the needed dietary changes:   Exercise and perform physical activity as directed by your caregiver.   Keep a daily record of everything you eat. There are many free websites to help you with this. It may be helpful to measure your foods so you can determine if you are eating the correct portion sizes.   Use low-calorie cookbooks or take special cooking classes.   Avoid alcohol. Drink more water and drinks with no calories.   Take vitamins and supplements only as recommended by your caregiver.   Weight loss support groups, Registered Dieticians, counselors, and stress reduction education can also be very helpful.  Document Released:  01/31/2004 Document Revised: 12/12/2010 Document Reviewed: 11/29/2006 [employer] Patient Information 2012 [employer]. 

## 2011-05-13 NOTE — Progress Notes (Signed)
  Subjective:    Patient ID: [name], female    DOB: 28-Jun-1955, 56 y.o.   MRN: [id number]  Hypertension This is a chronic problem. The current episode started more than 1 year ago. The problem has been gradually improving since onset. The problem is controlled. Associated symptoms include peripheral edema. Pertinent negatives include no anxiety, blurred vision, chest pain, headaches, malaise/fatigue, neck pain, orthopnea, palpitations, PND, shortness of breath or sweats. Past treatments include diuretics. The current treatment provides significant improvement. Compliance problems include exercise and diet.       Review of Systems  Constitutional: Negative for fever, chills, malaise/fatigue, diaphoresis, activity change, appetite change, fatigue and unexpected weight change.  HENT: Negative.  Negative for neck pain.   Eyes: Negative.  Negative for blurred vision.  Respiratory: Positive for apnea. Negative for cough, choking, chest tightness, shortness of breath, wheezing and stridor.   Cardiovascular: Negative for chest pain, palpitations, orthopnea, leg swelling and PND.  Gastrointestinal: Negative for nausea, vomiting, abdominal pain, diarrhea, constipation, blood in stool and abdominal distention.  Genitourinary: Negative.   Musculoskeletal: Negative for myalgias, back pain, joint swelling, arthralgias and gait problem.  Skin: Negative for color change, pallor, rash and wound.  Neurological: Negative.  Negative for headaches.  Hematological: Negative for adenopathy. Does not bruise/bleed easily.  Psychiatric/Behavioral: Negative.        Objective:   Physical Exam  Vitals reviewed. Constitutional: She is oriented to person, place, and time. She appears well-developed and well-nourished. No distress.  HENT:  Head: Normocephalic and atraumatic.  Mouth/Throat: Oropharynx is clear and moist. No oropharyngeal exudate.  Eyes: Conjunctivae are normal. Right eye exhibits no discharge.  Left eye exhibits no discharge. No scleral icterus.  Neck: Normal range of motion. Neck supple. No JVD present. No tracheal deviation present. No thyromegaly present.  Cardiovascular: Normal rate, regular rhythm, normal heart sounds and intact distal pulses.  Exam reveals no gallop and no friction rub.   No murmur heard. Pulmonary/Chest: Effort normal and breath sounds normal. No respiratory distress. She has no wheezes. She has no rales. She exhibits no tenderness.  Abdominal: Soft. Bowel sounds are normal. She exhibits no distension and no mass. There is no tenderness. There is no rebound and no guarding.  Musculoskeletal: Normal range of motion. She exhibits edema (1+ edema in BLE). She exhibits no tenderness.  Lymphadenopathy:    She has no cervical adenopathy.  Neurological: She is oriented to person, place, and time. She displays normal reflexes. She exhibits normal muscle tone. Coordination normal.  Skin: Skin is warm and dry. No rash noted. She is not diaphoretic. No erythema. No pallor.  Psychiatric: She has a normal mood and affect. Her behavior is normal. Judgment and thought content normal.     Lab Results  Component Value Date   WBC 8.6 01/13/2011   HGB 13.0 01/13/2011   HCT 39.3 01/13/2011   PLT 249.0 01/13/2011   GLUCOSE 94 01/13/2011   CHOL 186 01/13/2011   TRIG 98.0 01/13/2011   HDL 55.60 01/13/2011   LDLCALC 111* 01/13/2011   ALT 20 01/13/2011   AST 20 01/13/2011   NA 141 01/13/2011   K 3.6 01/13/2011   CL 101 01/13/2011   CREATININE 0.6 01/13/2011   BUN 10 01/13/2011   CO2 32 01/13/2011   TSH 1.17 01/13/2011   HGBA1C 5.6 01/13/2011       Assessment & Plan:

## 2011-08-13 ENCOUNTER — Ambulatory Visit: Payer: 59 | Admitting: Internal Medicine

## 2011-08-16 ENCOUNTER — Encounter: Payer: Self-pay | Admitting: Family Medicine

## 2011-08-16 ENCOUNTER — Ambulatory Visit (INDEPENDENT_AMBULATORY_CARE_PROVIDER_SITE_OTHER): Payer: 59 | Admitting: Family Medicine

## 2011-08-16 VITALS — BP 118/84 | HR 91 | Ht 63.0 in | Wt 282.0 lb

## 2011-08-16 DIAGNOSIS — E876 Hypokalemia: Secondary | ICD-10-CM | POA: Insufficient documentation

## 2011-08-16 DIAGNOSIS — R609 Edema, unspecified: Secondary | ICD-10-CM | POA: Insufficient documentation

## 2011-08-16 MED ORDER — FUROSEMIDE 20 MG PO TABS: 20.0000 mg | ORAL_TABLET | Freq: Every day | ORAL | Status: DC

## 2011-08-16 NOTE — Assessment & Plan Note (Signed)
New.  None noted in hands or L leg but extensive edema of R LE.  No evidence of CHF- no crackles present in lungs.  Start Lasix.  Reviewed diet and lifestyle changes to improve edema.  Will need f/u appt w/ PCP next week to recheck electrolytes and evaluate edema.

## 2011-08-16 NOTE — Assessment & Plan Note (Signed)
Pt w/ hx of this, on K+ 20 meq daily.  Double dose of med to while taking lasix.  Pt expressed understanding and is in agreement w/ plan.

## 2011-08-16 NOTE — Patient Instructions (Addendum)
Schedule a f/u appt w/ Dr Yetta Barre later this week to recheck swelling and electrolytes Start the Lasix once daily for the swelling Drink plenty of water Elevate as much as possible! Limit salt/sodium as much as possible! Call with any questions or concerns Hang in there!!!

## 2011-08-16 NOTE — Progress Notes (Signed)
  Subjective:    Patient ID: [name], female    DOB: [birth year], 56 y.o.   MRN: [id number]  HPI Edema- R>L, sxs started on Tuesday.  Thurs and Friday toes felt very tight and foot is tender to touch.  Hx of similar but not this severe.  Denies changes to diet recently, not eating out or using frozen dinners.  Reports good water intake.  Denies change in activity level.  Has been trying to elevate leg at work.  Swelling will improve overnight but not resolve.  No SOB.  Some swelling of hands- rings are tight.   Review of Systems For ROS see HPI     Objective:   Physical Exam  Vitals reviewed. Constitutional: She appears well-developed and well-nourished. No distress.  Neck: Normal range of motion. Neck supple.  Cardiovascular: Normal rate, regular rhythm, normal heart sounds and intact distal pulses.   Pulmonary/Chest: Effort normal and breath sounds normal. No respiratory distress. She has no wheezes. She has no rales.  Musculoskeletal: She exhibits edema (R LE edema, +2 pitting to mid shin).  Lymphadenopathy:    She has no cervical adenopathy.  Skin: Skin is warm and dry. No erythema.          Assessment & Plan:

## 2011-08-25 ENCOUNTER — Ambulatory Visit (INDEPENDENT_AMBULATORY_CARE_PROVIDER_SITE_OTHER): Payer: 59 | Admitting: Internal Medicine

## 2011-08-25 ENCOUNTER — Encounter: Payer: Self-pay | Admitting: Internal Medicine

## 2011-08-25 ENCOUNTER — Other Ambulatory Visit (INDEPENDENT_AMBULATORY_CARE_PROVIDER_SITE_OTHER): Payer: 59

## 2011-08-25 VITALS — BP 118/78 | HR 99 | Temp 98.5°F | Resp 16 | Wt 279.2 lb

## 2011-08-25 DIAGNOSIS — I10 Essential (primary) hypertension: Secondary | ICD-10-CM

## 2011-08-25 DIAGNOSIS — R609 Edema, unspecified: Secondary | ICD-10-CM

## 2011-08-25 DIAGNOSIS — E876 Hypokalemia: Secondary | ICD-10-CM

## 2011-08-25 LAB — BASIC METABOLIC PANEL
BUN: 16 mg/dL (ref 6–23)
CO2: 33 mEq/L — ABNORMAL HIGH (ref 19–32)
Calcium: 9.4 mg/dL (ref 8.4–10.5)
Chloride: 99 mEq/L (ref 96–112)
Creatinine, Ser: 0.7 mg/dL (ref 0.4–1.2)
GFR: 115.13 mL/min (ref >60.00)
Glucose, Bld: 93 mg/dL (ref 70–99)
Potassium: 3.4 mEq/L — ABNORMAL LOW (ref 3.5–5.1)
Sodium: 139 mEq/L (ref 135–145)

## 2011-08-25 LAB — MAGNESIUM: Magnesium: 1.9 mg/dL (ref 1.5–2.5)

## 2011-08-25 NOTE — Assessment & Plan Note (Signed)
She has no s/s of CHF, this is related to her weight. She has improved on diuretics.

## 2011-08-25 NOTE — Progress Notes (Signed)
  Subjective:    Patient ID: [name], female    DOB: Apr 08, 1955, 56 y.o.   MRN: [id number]  Hypertension This is a chronic problem. The current episode started more than 1 year ago. The problem has been gradually improving since onset. The problem is controlled. Associated symptoms include peripheral edema. Pertinent negatives include no anxiety, blurred vision, chest pain, headaches, malaise/fatigue, neck pain, orthopnea, palpitations, PND, shortness of breath or sweats. Past treatments include diuretics. The current treatment provides moderate improvement. Compliance problems include diet and exercise.  Identifiable causes of hypertension include sleep apnea.      Review of Systems  Constitutional: Negative for fever, chills, malaise/fatigue, diaphoresis, activity change, appetite change, fatigue and unexpected weight change.  HENT: Negative.  Negative for neck pain.   Eyes: Negative.  Negative for blurred vision.  Respiratory: Negative for cough, chest tightness, shortness of breath, wheezing and stridor.   Cardiovascular: Positive for leg swelling. Negative for chest pain, palpitations, orthopnea and PND.  Gastrointestinal: Negative.   Genitourinary: Negative.   Musculoskeletal: Negative.   Skin: Negative.   Neurological: Negative.  Negative for headaches.  Hematological: Negative for adenopathy. Does not bruise/bleed easily.       Objective:   Physical Exam  Vitals reviewed. Constitutional: She is oriented to person, place, and time. She appears well-developed and well-nourished. No distress.  HENT:  Head: Normocephalic and atraumatic.  Mouth/Throat: Oropharynx is clear and moist. No oropharyngeal exudate.  Eyes: Conjunctivae are normal. Right eye exhibits no discharge. Left eye exhibits no discharge. No scleral icterus.  Neck: Normal range of motion. Neck supple. No JVD present. No tracheal deviation present. No thyromegaly present.  Cardiovascular: Normal rate, regular  rhythm, normal heart sounds and intact distal pulses.  Exam reveals no gallop.   No murmur heard. Pulmonary/Chest: Effort normal and breath sounds normal. No stridor. No respiratory distress. She has no wheezes. She has no rales. She exhibits no tenderness.  Abdominal: Soft. Bowel sounds are normal. She exhibits no distension and no mass. There is no tenderness. There is no rebound and no guarding.  Musculoskeletal: Normal range of motion. She exhibits edema (trace edema in BLE). She exhibits no tenderness.  Lymphadenopathy:    She has no cervical adenopathy.  Neurological: She is oriented to person, place, and time.  Skin: Skin is warm and dry. No rash noted. She is not diaphoretic. No erythema. No pallor.  Psychiatric: She has a normal mood and affect. Her behavior is normal. Judgment and thought content normal.     Lab Results  Component Value Date   WBC 8.6 01/13/2011   HGB 13.0 01/13/2011   HCT 39.3 01/13/2011   PLT 249.0 01/13/2011   GLUCOSE 94 01/13/2011   CHOL 186 01/13/2011   TRIG 98.0 01/13/2011   HDL 55.60 01/13/2011   LDLCALC 111* 01/13/2011   ALT 20 01/13/2011   AST 20 01/13/2011   NA 141 01/13/2011   K 3.6 01/13/2011   CL 101 01/13/2011   CREATININE 0.6 01/13/2011   BUN 10 01/13/2011   CO2 32 01/13/2011   TSH 1.17 01/13/2011   HGBA1C 5.6 01/13/2011       Assessment & Plan:

## 2011-08-25 NOTE — Patient Instructions (Signed)
Edema Edema is an abnormal build-up of fluids in tissues. Because this is partly dependent on gravity (water flows to the lowest place), it is more common in the leg sand thighs (lower extremities). It is also common in the looser tissues, like around the eyes. Painless swelling of the feet and ankles is common and increases as a person ages. It may affect both legs and may include the calves or even thighs. When squeezed, the fluid may move out of the affected area and may leave a dent for a few moments. CAUSES   Prolonged standing or sitting in one place for extended periods of time. Movement helps pump tissue fluid into the veins, and absence of movement prevents this, resulting in edema.   Varicose veins. The valves in the veins do not work as well as they should. This causes fluid to leak into the tissues.   Fluid and salt overload.   Injury, burn, or surgery to the leg, ankle, or foot, may damage veins and allow fluid to leak out.   Sunburn damages vessels. Leaky vessels allow fluid to go out into the sunburned tissues.   Allergies (from insect bites or stings, medications or chemicals) cause swelling by allowing vessels to become leaky.   Protein in the blood helps keep fluid in your vessels. Low protein, as in malnutrition, allows fluid to leak out.   Hormonal changes, including pregnancy and menstruation, cause fluid retention. This fluid may leak out of vessels and cause edema.   Medications that cause fluid retention. Examples are sex hormones, blood pressure medications, steroid treatment, or anti-depressants.   Some illnesses cause edema, especially heart failure, kidney disease, or liver disease.   Surgery that cuts veins or lymph nodes, such as surgery done for the heart or for breast cancer, may result in edema.  DIAGNOSIS  Your caregiver is usually easily able to determine what is causing your swelling (edema) by simply asking what is wrong (getting a history) and examining  you (doing a physical). Sometimes x-rays, EKG (electrocardiogram or heart tracing), and blood work may be done to evaluate for underlying medical illness. TREATMENT  General treatment includes:  Leg elevation (or elevation of the affected body part).   Restriction of fluid intake.   Prevention of fluid overload.   Compression of the affected body part. Compression with elastic bandages or support stockings squeezes the tissues, preventing fluid from entering and forcing it back into the blood vessels.   Diuretics (also called water pills or fluid pills) pull fluid out of your body in the form of increased urination. These are effective in reducing the swelling, but can have side effects and must be used only under your caregiver's supervision. Diuretics are appropriate only for some types of edema.  The specific treatment can be directed at any underlying causes discovered. Heart, liver, or kidney disease should be treated appropriately. HOME CARE INSTRUCTIONS   Elevate the legs (or affected body part) above the level of the heart, while lying down.   Avoid sitting or standing still for prolonged periods of time.   Avoid putting anything directly under the knees when lying down, and do not wear constricting clothing or garters on the upper legs.   Exercising the legs causes the fluid to work back into the veins and lymphatic channels. This may help the swelling go down.   The pressure applied by elastic bandages or support stockings can help reduce ankle swelling.   A low-salt diet may help reduce fluid   retention and decrease the ankle swelling.   Take any medications exactly as prescribed.  SEEK MEDICAL CARE IF:  Your edema is not responding to recommended treatments. SEEK IMMEDIATE MEDICAL CARE IF:   You develop shortness of breath or chest pain.   You cannot breathe when you lay down; or if, while lying down, you have to get up and go to the window to get your breath.   You  are having increasing swelling without relief from treatment.   You develop a fever over 102 F (38.9 C).   You develop pain or redness in the areas that are swollen.   Tell your caregiver right away if you have gained 3 lb/1.4 kg in 1 day or 5 lb/2.3 kg in a week.  MAKE SURE YOU:   Understand these instructions.   Will watch your condition.   Will get help right away if you are not doing well or get worse.  Document Released: 12/23/2004 Document Revised: 12/12/2010 Document Reviewed: 08/11/2007 [employer] Patient Information 2012 [employer]. 

## 2011-08-25 NOTE — Assessment & Plan Note (Signed)
I will check her K+ and Mg++ levels today 

## 2011-08-25 NOTE — Assessment & Plan Note (Signed)
Her BP is well controlled, I will check her lytes and renal function today 

## 2011-09-12 ENCOUNTER — Ambulatory Visit: Payer: 59 | Admitting: Internal Medicine

## 2011-09-12 ENCOUNTER — Ambulatory Visit: Payer: 59 | Admitting: Pulmonary Disease

## 2011-09-25 ENCOUNTER — Ambulatory Visit (INDEPENDENT_AMBULATORY_CARE_PROVIDER_SITE_OTHER): Payer: 59 | Admitting: Pulmonary Disease

## 2011-09-25 ENCOUNTER — Encounter: Payer: Self-pay | Admitting: Pulmonary Disease

## 2011-09-25 VITALS — BP 118/72 | HR 94 | Temp 97.6°F | Ht 63.0 in | Wt 282.8 lb

## 2011-09-25 DIAGNOSIS — Z23 Encounter for immunization: Secondary | ICD-10-CM

## 2011-09-25 DIAGNOSIS — G4733 Obstructive sleep apnea (adult) (pediatric): Secondary | ICD-10-CM

## 2011-09-25 DIAGNOSIS — J309 Allergic rhinitis, unspecified: Secondary | ICD-10-CM

## 2011-09-25 NOTE — Progress Notes (Signed)
  Subjective:    Patient ID: [name], female    DOB: 1955/09/25, 56 y.o.   MRN: [id number]  HPI PCP - [name]   56/f, for FU of obstructive sleep apnea  PSG '06 [facility] 264)>> severe OSA with AHI 52/h including 50 obstructive apneas & 81 hypopneas corrected by 14 cm , nadir desatn to 69%.  She required ERCP for acute pancreatitis foll by resp failure requiring tstomy  Lap band sx in dec'10  Jan '12 band flipped, required surgery, Had lost 60, gained 30 lbs back  TSH nml  She underwent TAH + BSO in '92   PSG 2/13 - 269 pounds with a height of 5 feet 3 inches. BMI of 48. Neck  size of 15.5 inches.  Severe obstructive sleep apnea with hypopneas AHI 56/h causing sleep problems, fragmentation, and oxygen desaturation. This was corrected by an optimal CPAP of 15 cm with a small full- face mask, C flex 2.   Download [name]) 2/28- 04/07/11 shows good compliance, residual Ahi 3/h, 30 mins avg leak daily On 15 cm   09/25/2011  wears cpap everynight x 6 hrs a night. c/o mask is ucomfortable-has a scar on face from mask. always having to adjust mask. usually 1 night out of the week she will wake up and can't go bakc to sleep. She sleeps with her mouth open and wakes up w/ dry mouth Pressure ok   Review of Systems neg for any significant sore throat, dysphagia, itching, sneezing, nasal congestion or excess/ purulent secretions, fever, chills, sweats, unintended wt loss, pleuritic or exertional cp, hempoptysis, orthopnea pnd or change in chronic leg swelling. Also denies presyncope, palpitations, heartburn, abdominal pain, nausea, vomiting, diarrhea or change in bowel or urinary habits, dysuria,hematuria, rash, arthralgias, visual complaints, headache, numbness weakness or ataxia.     Objective:   Physical Exam  Gen. Pleasant, obese, in no distress ENT - no lesions, no post nasal drip Neck: No JVD, no thyromegaly, no carotid bruits Lungs: no use of accessory muscles, no dullness to  percussion, decreased without rales or rhonchi  Cardiovascular: Rhythm regular, heart sounds  normal, no murmurs or gallops, no peripheral edema Musculoskeletal: No deformities, no cyanosis or clubbing , no tremors        Assessment & Plan:

## 2011-09-25 NOTE — Assessment & Plan Note (Signed)
Trial of nasal spray -nasonex each nare at bedtime - for nasal congestion

## 2011-09-25 NOTE — Assessment & Plan Note (Signed)
Can try melatonin 5mg  3h before bedtime Call sleep lab [phone] - for mask fit  Flu shot   Weight loss encouraged, compliance with goal of at least 6 hrs every night is the expectation. Advised against medications with sedative side effects Cautioned against driving when sleepy - understanding that sleepiness will vary on a day to day basis

## 2011-09-25 NOTE — Patient Instructions (Addendum)
Trial of nasal spray -nasonex each nare at bedtime Can try melatonin 5mg  3h before bedtime Call sleep lab [phone] - for mask fit  Flu shot

## 2011-11-07 ENCOUNTER — Other Ambulatory Visit: Payer: Self-pay | Admitting: Obstetrics and Gynecology

## 2011-11-07 DIAGNOSIS — Z1231 Encounter for screening mammogram for malignant neoplasm of breast: Secondary | ICD-10-CM

## 2011-11-07 DIAGNOSIS — Z9889 Other specified postprocedural states: Secondary | ICD-10-CM

## 2011-12-09 ENCOUNTER — Ambulatory Visit
Admission: RE | Admit: 2011-12-09 | Discharge: 2011-12-09 | Disposition: A | Discharge status: Home / Self Care | Payer: 59 | Source: Ambulatory Visit | Attending: Obstetrics and Gynecology | Admitting: Obstetrics and Gynecology

## 2011-12-09 DIAGNOSIS — Z1231 Encounter for screening mammogram for malignant neoplasm of breast: Secondary | ICD-10-CM

## 2011-12-09 DIAGNOSIS — Z9889 Other specified postprocedural states: Secondary | ICD-10-CM

## 2011-12-12 ENCOUNTER — Other Ambulatory Visit (INDEPENDENT_AMBULATORY_CARE_PROVIDER_SITE_OTHER): Payer: 59

## 2011-12-12 ENCOUNTER — Encounter: Payer: Self-pay | Admitting: Internal Medicine

## 2011-12-12 ENCOUNTER — Ambulatory Visit (INDEPENDENT_AMBULATORY_CARE_PROVIDER_SITE_OTHER): Payer: 59 | Admitting: Internal Medicine

## 2011-12-12 VITALS — BP 116/74 | HR 80 | Temp 98.5°F | Resp 16 | Wt 277.0 lb

## 2011-12-12 DIAGNOSIS — E876 Hypokalemia: Secondary | ICD-10-CM

## 2011-12-12 DIAGNOSIS — I10 Essential (primary) hypertension: Secondary | ICD-10-CM

## 2011-12-12 DIAGNOSIS — J309 Allergic rhinitis, unspecified: Secondary | ICD-10-CM

## 2011-12-12 DIAGNOSIS — R609 Edema, unspecified: Secondary | ICD-10-CM

## 2011-12-12 LAB — BASIC METABOLIC PANEL
BUN: 15 mg/dL (ref 6–23)
CO2: 32 mEq/L (ref 19–32)
Calcium: 9.5 mg/dL (ref 8.4–10.5)
Chloride: 101 mEq/L (ref 96–112)
Creatinine, Ser: 0.8 mg/dL (ref 0.4–1.2)
GFR: 99.64 mL/min (ref >60.00)
Glucose, Bld: 103 mg/dL — ABNORMAL HIGH (ref 70–99)
Potassium: 3.5 mEq/L (ref 3.5–5.1)
Sodium: 142 mEq/L (ref 135–145)

## 2011-12-12 LAB — MAGNESIUM: Magnesium: 1.9 mg/dL (ref 1.5–2.5)

## 2011-12-12 MED ORDER — FUROSEMIDE 20 MG PO TABS: 20.0000 mg | ORAL_TABLET | Freq: Two times a day (BID) | ORAL | Status: DC

## 2011-12-12 MED ORDER — OLMESARTAN MEDOXOMIL 20 MG PO TABS: 20.0000 mg | ORAL_TABLET | Freq: Every day | ORAL | Status: DC

## 2011-12-12 MED ORDER — MOMETASONE FUROATE 50 MCG/ACT NA SUSP: 2.0000 | Freq: Every day | NASAL | Status: DC

## 2011-12-12 NOTE — Assessment & Plan Note (Signed)
Start nasonex I asked her to stop taking sudafed due to her hypertension

## 2011-12-12 NOTE — Assessment & Plan Note (Signed)
Stop HCTZ Increase lasix Start benicar Check lytes and renal function today

## 2011-12-12 NOTE — Progress Notes (Signed)
Subjective:    Patient ID: [name], female    DOB: Sep 28, 1955, 56 y.o.   MRN: [id number]  Hypertension This is a chronic problem. The current episode started more than 1 year ago. The problem has been gradually improving since onset. The problem is controlled. Associated symptoms include peripheral edema. Pertinent negatives include no anxiety, blurred vision, chest pain, headaches, malaise/fatigue, neck pain, orthopnea, palpitations, PND, shortness of breath or sweats. Agents associated with hypertension include decongestants and NSAIDs. Past treatments include diuretics. The current treatment provides significant improvement. Compliance problems include exercise and diet.  There is no history of angina, kidney disease, CAD/MI, heart failure, left ventricular hypertrophy, PVD or retinopathy. Identifiable causes of hypertension include sleep apnea.      Review of Systems  Constitutional: Negative for fever, chills, malaise/fatigue, diaphoresis, activity change, appetite change, fatigue and unexpected weight change.  HENT: Positive for congestion, rhinorrhea, sneezing and postnasal drip. Negative for ear pain, nosebleeds, sore throat, facial swelling, trouble swallowing, neck pain, voice change, sinus pressure and tinnitus.   Eyes: Negative.  Negative for blurred vision.  Respiratory: Positive for apnea. Negative for cough, chest tightness, shortness of breath, wheezing and stridor.   Cardiovascular: Positive for leg swelling. Negative for chest pain, palpitations, orthopnea and PND.  Gastrointestinal: Negative for nausea, vomiting, abdominal pain, diarrhea and constipation.  Genitourinary: Negative.   Musculoskeletal: Negative.  Negative for myalgias, back pain, joint swelling, arthralgias and gait problem.  Skin: Negative for color change, pallor, rash and wound.  Neurological: Negative for dizziness, tremors, seizures, syncope, facial asymmetry, speech difficulty, weakness,  light-headedness, numbness and headaches.  Hematological: Negative for adenopathy. Does not bruise/bleed easily.  Psychiatric/Behavioral: Negative.        Objective:   Physical Exam  Vitals reviewed. Constitutional: She is oriented to person, place, and time. She appears well-developed and well-nourished. No distress.  HENT:  Head: Normocephalic and atraumatic. No trismus in the jaw.  Right Ear: Hearing, tympanic membrane, external ear and ear canal normal.  Left Ear: Hearing, tympanic membrane, external ear and ear canal normal.  Nose: Mucosal edema and rhinorrhea present. No sinus tenderness or nasal deformity. No epistaxis. Right sinus exhibits no maxillary sinus tenderness and no frontal sinus tenderness. Left sinus exhibits no maxillary sinus tenderness and no frontal sinus tenderness.  Mouth/Throat: Oropharynx is clear and moist and mucous membranes are normal. Mucous membranes are not pale, not dry and not cyanotic. No oral lesions. No uvula swelling. No oropharyngeal exudate, posterior oropharyngeal edema, posterior oropharyngeal erythema or tonsillar abscesses.  Eyes: Conjunctivae normal are normal. Right eye exhibits no discharge. Left eye exhibits no discharge. No scleral icterus.  Neck: Normal range of motion. Neck supple. No JVD present. No tracheal deviation present. No thyromegaly present.  Cardiovascular: Normal rate, regular rhythm, normal heart sounds and intact distal pulses.  Exam reveals no gallop and no friction rub.   No murmur heard. Pulmonary/Chest: Effort normal and breath sounds normal. No stridor. No respiratory distress. She has no wheezes. She has no rales. She exhibits no tenderness.  Abdominal: Soft. Bowel sounds are normal. She exhibits no distension and no mass. There is no tenderness. There is no rebound and no guarding.  Musculoskeletal: Normal range of motion. She exhibits edema (1+ pitting edema in BLE). She exhibits no tenderness.  Lymphadenopathy:     She has no cervical adenopathy.  Neurological: She is oriented to person, place, and time.  Skin: Skin is warm and dry. No rash noted. She is not diaphoretic. No  erythema. No pallor.  Psychiatric: She has a normal mood and affect. Her behavior is normal. Judgment and thought content normal.     Lab Results  Component Value Date   WBC 8.6 01/13/2011   HGB 13.0 01/13/2011   HCT 39.3 01/13/2011   PLT 249.0 01/13/2011   GLUCOSE 93 08/25/2011   CHOL 186 01/13/2011   TRIG 98.0 01/13/2011   HDL 55.60 01/13/2011   LDLCALC 111* 01/13/2011   ALT 20 01/13/2011   AST 20 01/13/2011   NA 139 08/25/2011   K 3.4* 08/25/2011   CL 99 08/25/2011   CREATININE 0.7 08/25/2011   BUN 16 08/25/2011   CO2 33* 08/25/2011   TSH 1.17 01/13/2011   HGBA1C 5.6 01/13/2011       Assessment & Plan:

## 2011-12-12 NOTE — Assessment & Plan Note (Signed)
I will check her K+ and Mg++ levels today 

## 2011-12-12 NOTE — Patient Instructions (Signed)
Edema Edema is an abnormal build-up of fluids in tissues. Because this is partly dependent on gravity (water flows to the lowest place), it is more common in the legs and thighs (lower extremities). It is also common in the looser tissues, like around the eyes. Painless swelling of the feet and ankles is common and increases as a person ages. It may affect both legs and may include the calves or even thighs. When squeezed, the fluid may move out of the affected area and may leave a dent for a few moments. CAUSES   Prolonged standing or sitting in one place for extended periods of time. Movement helps pump tissue fluid into the veins, and absence of movement prevents this, resulting in edema.  Varicose veins. The valves in the veins do not work as well as they should. This causes fluid to leak into the tissues.  Fluid and salt overload.  Injury, burn, or surgery to the leg, ankle, or foot, may damage veins and allow fluid to leak out.  Sunburn damages vessels. Leaky vessels allow fluid to go out into the sunburned tissues.  Allergies (from insect bites or stings, medications or chemicals) cause swelling by allowing vessels to become leaky.  Protein in the blood helps keep fluid in your vessels. Low protein, as in malnutrition, allows fluid to leak out.  Hormonal changes, including pregnancy and menstruation, cause fluid retention. This fluid may leak out of vessels and cause edema.  Medications that cause fluid retention. Examples are sex hormones, blood pressure medications, steroid treatment, or anti-depressants.  Some illnesses cause edema, especially heart failure, kidney disease, or liver disease.  Surgery that cuts veins or lymph nodes, such as surgery done for the heart or for breast cancer, may result in edema. DIAGNOSIS  Your caregiver is usually easily able to determine what is causing your swelling (edema) by simply asking what is wrong (getting a history) and examining you (doing  a physical). Sometimes x-rays, EKG (electrocardiogram or heart tracing), and blood work may be done to evaluate for underlying medical illness. TREATMENT  General treatment includes:  Leg elevation (or elevation of the affected body part).  Restriction of fluid intake.  Prevention of fluid overload.  Compression of the affected body part. Compression with elastic bandages or support stockings squeezes the tissues, preventing fluid from entering and forcing it back into the blood vessels.  Diuretics (also called water pills or fluid pills) pull fluid out of your body in the form of increased urination. These are effective in reducing the swelling, but can have side effects and must be used only under your caregiver's supervision. Diuretics are appropriate only for some types of edema. The specific treatment can be directed at any underlying causes discovered. Heart, liver, or kidney disease should be treated appropriately. HOME CARE INSTRUCTIONS   Elevate the legs (or affected body part) above the level of the heart, while lying down.  Avoid sitting or standing still for prolonged periods of time.  Avoid putting anything directly under the knees when lying down, and do not wear constricting clothing or garters on the upper legs.  Exercising the legs causes the fluid to work back into the veins and lymphatic channels. This may help the swelling go down.  The pressure applied by elastic bandages or support stockings can help reduce ankle swelling.  A low-salt diet may help reduce fluid retention and decrease the ankle swelling.  Take any medications exactly as prescribed. SEEK MEDICAL CARE IF:  Your edema is   not responding to recommended treatments. SEEK IMMEDIATE MEDICAL CARE IF:   You develop shortness of breath or chest pain.  You cannot breathe when you lay down; or if, while lying down, you have to get up and go to the window to get your breath.  You are having increasing  swelling without relief from treatment.  You develop a fever over 102 F (38.9 C).  You develop pain or redness in the areas that are swollen.  Tell your caregiver right away if you have gained 3 lb/1.4 kg in 1 day or 5 lb/2.3 kg in a week. MAKE SURE YOU:   Understand these instructions.  Will watch your condition.  Will get help right away if you are not doing well or get worse. Document Released: 12/23/2004 Document Revised: 06/24/2011 Document Reviewed: 08/11/2007 [employer] Patient Information 2013 [employer].  

## 2011-12-12 NOTE — Assessment & Plan Note (Signed)
To help reduce her edema will stop the HCTZ and increase the lasix dose I told her that the main cause for her edema is her weight She was given pt.ed. Material about edema

## 2011-12-15 ENCOUNTER — Other Ambulatory Visit: Payer: Self-pay

## 2011-12-15 MED ORDER — POTASSIUM CHLORIDE CRYS ER 20 MEQ PO TBCR: 20.0000 meq | EXTENDED_RELEASE_TABLET | Freq: Every day | ORAL | Status: DC

## 2011-12-15 MED ORDER — DULOXETINE HCL 30 MG PO CPEP: 30.0000 mg | ORAL_CAPSULE | Freq: Every day | ORAL | Status: DC

## 2012-01-15 ENCOUNTER — Other Ambulatory Visit: Payer: Self-pay | Admitting: Orthopedic Surgery

## 2012-01-15 DIAGNOSIS — R531 Weakness: Secondary | ICD-10-CM

## 2012-01-15 DIAGNOSIS — M25561 Pain in right knee: Secondary | ICD-10-CM

## 2012-01-18 ENCOUNTER — Other Ambulatory Visit: Payer: 59

## 2012-01-18 ENCOUNTER — Other Ambulatory Visit: Payer: Self-pay | Admitting: Internal Medicine

## 2012-02-23 ENCOUNTER — Other Ambulatory Visit: Payer: Self-pay | Admitting: Internal Medicine

## 2012-03-11 ENCOUNTER — Encounter: Payer: Self-pay | Admitting: Internal Medicine

## 2012-03-11 ENCOUNTER — Ambulatory Visit (INDEPENDENT_AMBULATORY_CARE_PROVIDER_SITE_OTHER): Payer: BC Managed Care – PPO | Admitting: Internal Medicine

## 2012-03-11 VITALS — BP 140/70 | HR 90 | Temp 98.2°F | Resp 16 | Wt 269.0 lb

## 2012-03-11 DIAGNOSIS — I10 Essential (primary) hypertension: Secondary | ICD-10-CM

## 2012-03-11 DIAGNOSIS — B9689 Other specified bacterial agents as the cause of diseases classified elsewhere: Secondary | ICD-10-CM

## 2012-03-11 DIAGNOSIS — R7309 Other abnormal glucose: Secondary | ICD-10-CM

## 2012-03-11 DIAGNOSIS — J309 Allergic rhinitis, unspecified: Secondary | ICD-10-CM

## 2012-03-11 DIAGNOSIS — J329 Chronic sinusitis, unspecified: Secondary | ICD-10-CM

## 2012-03-11 DIAGNOSIS — E876 Hypokalemia: Secondary | ICD-10-CM

## 2012-03-11 DIAGNOSIS — A499 Bacterial infection, unspecified: Secondary | ICD-10-CM

## 2012-03-11 MED ORDER — TRIAMTERENE-HCTZ 37.5-25 MG PO TABS: 1.0000 | ORAL_TABLET | Freq: Every day | ORAL | Status: DC

## 2012-03-11 MED ORDER — METHYLPREDNISOLONE ACETATE 80 MG/ML IJ SUSP
80.0000 mg | Freq: Once | INTRAMUSCULAR | Status: AC
  Administered 2012-03-11: 80 mg via INTRAMUSCULAR

## 2012-03-11 MED ORDER — METHYLPREDNISOLONE ACETATE 40 MG/ML IJ SUSP
40.0000 mg | Freq: Once | INTRAMUSCULAR | Status: AC
  Administered 2012-03-11: 40 mg via INTRAMUSCULAR

## 2012-03-11 MED ORDER — HYDROCOD POLST-CPM POLST ER 10-8 MG PO CP12: 1.0000 | ORAL_CAPSULE | Freq: Two times a day (BID) | ORAL | Status: DC | PRN

## 2012-03-11 MED ORDER — AMOXICILLIN 875 MG PO TABS: 875.0000 mg | ORAL_TABLET | Freq: Two times a day (BID) | ORAL | Status: DC

## 2012-03-11 NOTE — Assessment & Plan Note (Signed)
I will change her diuretics to maxzide

## 2012-03-11 NOTE — Assessment & Plan Note (Signed)
Start amoxil for the infection and a cough suppressant

## 2012-03-11 NOTE — Patient Instructions (Signed)

## 2012-03-11 NOTE — Assessment & Plan Note (Signed)
She is having a flare of symptoms so I gave her an injection of depo-medrol IM

## 2012-03-11 NOTE — Assessment & Plan Note (Signed)
I will change Lasix and HCTX to Maxzide

## 2012-03-11 NOTE — Progress Notes (Signed)
Subjective:    Patient ID: [name], female    DOB: 1955/02/25, 57 y.o.   MRN: [id number]  Cough This is a new problem. The current episode started 1 to 4 weeks ago. The problem has been unchanged. The cough is productive of purulent sputum. Associated symptoms include nasal congestion, postnasal drip, rhinorrhea and a sore throat. Pertinent negatives include no chest pain, chills, ear congestion, ear pain, fever, headaches, heartburn, hemoptysis, myalgias, rash, shortness of breath, sweats, weight loss or wheezing. Nothing aggravates the symptoms. There is no history of asthma, COPD or pneumonia.      Review of Systems  Constitutional: Negative for fever, chills, weight loss, diaphoresis, activity change, appetite change, fatigue and unexpected weight change.  HENT: Positive for congestion, sore throat, rhinorrhea, sneezing, postnasal drip and sinus pressure. Negative for hearing loss, ear pain, nosebleeds, facial swelling, drooling, mouth sores, trouble swallowing, neck pain, neck stiffness, dental problem, voice change, tinnitus and ear discharge.   Eyes: Negative.   Respiratory: Positive for cough. Negative for apnea, hemoptysis, choking, chest tightness, shortness of breath, wheezing and stridor.   Cardiovascular: Negative for chest pain, palpitations and leg swelling.  Gastrointestinal: Negative for heartburn, nausea, vomiting, abdominal pain, diarrhea, constipation and blood in stool.  Endocrine: Negative.   Genitourinary: Negative.   Musculoskeletal: Negative for myalgias, back pain, joint swelling, arthralgias and gait problem.  Skin: Negative for color change, pallor, rash and wound.  Allergic/Immunologic: Negative.   Neurological: Negative.  Negative for dizziness, weakness, light-headedness, numbness and headaches.  Hematological: Negative.  Negative for adenopathy. Does not bruise/bleed easily.  Psychiatric/Behavioral: Negative.        Objective:   Physical Exam  Vitals  reviewed. Constitutional: She is oriented to person, place, and time. She appears well-developed and well-nourished.  Non-toxic appearance. She does not have a sickly appearance. She does not appear ill. No distress.  HENT:  Head: Normocephalic and atraumatic.  Mouth/Throat: Oropharynx is clear and moist. No oropharyngeal exudate.  Eyes: Conjunctivae are normal. Right eye exhibits no discharge. Left eye exhibits no discharge. No scleral icterus.  Neck: Normal range of motion. Neck supple. No JVD present. No tracheal deviation present. No thyromegaly present.  Cardiovascular: Normal rate, regular rhythm, normal heart sounds and intact distal pulses.  Exam reveals no gallop and no friction rub.   No murmur heard. Pulmonary/Chest: Effort normal and breath sounds normal. No stridor. No respiratory distress. She has no wheezes. She has no rales. She exhibits no tenderness.  Abdominal: Soft. Bowel sounds are normal. She exhibits no distension and no mass. There is no tenderness. There is no rebound and no guarding.  Musculoskeletal: Normal range of motion. She exhibits no edema and no tenderness.  Lymphadenopathy:    She has no cervical adenopathy.  Neurological: She is oriented to person, place, and time.  Skin: Skin is warm and dry. No rash noted. She is not diaphoretic. No erythema. No pallor.  Psychiatric: She has a normal mood and affect. Her behavior is normal. Judgment and thought content normal.     Lab Results  Component Value Date   WBC 8.6 01/13/2011   HGB 13.0 01/13/2011   HCT 39.3 01/13/2011   PLT 249.0 01/13/2011   GLUCOSE 103* 12/12/2011   CHOL 186 01/13/2011   TRIG 98.0 01/13/2011   HDL 55.60 01/13/2011   LDLCALC 111* 01/13/2011   ALT 20 01/13/2011   AST 20 01/13/2011   NA 142 12/12/2011   K 3.5 12/12/2011   CL 101 12/12/2011  CREATININE 0.8 12/12/2011   BUN 15 12/12/2011   CO2 32 12/12/2011   TSH 1.17 01/13/2011   HGBA1C 5.6 01/13/2011       Assessment & Plan:

## 2012-03-22 ENCOUNTER — Telehealth: Payer: Self-pay | Admitting: Pulmonary Disease

## 2012-03-22 NOTE — Telephone Encounter (Signed)
done

## 2012-03-22 NOTE — Telephone Encounter (Signed)
Fax received.  Will page Dr Vassie Loll to let him know that order is placed in his to do folder on side A to be signed when he comes by office.  Pt has been waiting on new mask and supplies for 2 weeks.

## 2012-03-22 NOTE — Telephone Encounter (Signed)
I am not in the office until next week. [name] should be able to deliver based on order & the CMN can be signed later - pl ask them if they can do so. If not, pl ask doc of the day or other sleep doc  to sign for me pl.

## 2012-03-22 NOTE — Telephone Encounter (Signed)
Dr. Maple Hudson, form is on [name]'s desk.  Will you pls look at this and advise if you can sign it for RA?  Pls advise.  Thank you.

## 2012-03-22 NOTE — Telephone Encounter (Signed)
Pt states that [name] has sent order to Dr Vassie Loll over 2 weeks ago to have new mask and cpap supplies.   I cannot locate any orders waiting to be signed.  Spoke with [name] and they are going to refax orders to triage fax machine.  Will await fax.

## 2012-03-23 NOTE — Telephone Encounter (Signed)
Thanks, Dr Maple Hudson

## 2012-03-23 NOTE — Telephone Encounter (Signed)
Form signed by Dr. Maple Hudson.  It has been Faxed to [place]. Called, spoke with pt.  I informed pt that CDY signed form as RA is not in the office until next week. Pt states she has already heard from [place] as they did receive fax.  She was appreciative of this and voiced no further questions or concerns at this time Mindy has the form from [place]. Will sign off and route to Dr. Vassie Loll as Lorain Childes.

## 2012-03-24 ENCOUNTER — Other Ambulatory Visit: Payer: Self-pay

## 2012-03-24 DIAGNOSIS — E876 Hypokalemia: Secondary | ICD-10-CM

## 2012-03-24 DIAGNOSIS — I10 Essential (primary) hypertension: Secondary | ICD-10-CM

## 2012-03-24 MED ORDER — DULOXETINE HCL 30 MG PO CPEP: 30.0000 mg | ORAL_CAPSULE | Freq: Every day | ORAL | Status: DC

## 2012-03-24 MED ORDER — TRIAMTERENE-HCTZ 37.5-25 MG PO TABS: 1.0000 | ORAL_TABLET | Freq: Every day | ORAL | Status: DC

## 2012-04-09 ENCOUNTER — Telehealth (INDEPENDENT_AMBULATORY_CARE_PROVIDER_SITE_OTHER): Payer: Self-pay | Admitting: Surgery

## 2012-04-09 NOTE — Telephone Encounter (Signed)
04/09/12 I received a message from our front desk staff stating the patient had called to inquire if we had received her records. She wishes to have her lap band removed (placed at another facility). The patient would like to have the band removed by the end of the month due to losing her job and her insurance will end at the end of April. I emailed the patient and advised her that it will take longer than one month for her to obtain a surgery date. We would schedule a consult with the surgeon after I receive her records. She would then have to complete any testing deemed necessary per the surgeon. After the surgeon notifies me that he is ready and willing to remove her lap band, then I would have to send a request to her insurance for prior approval. That process can take weeks to obtain approval. We would then collect a deposit and schedule surgery. Surgeries are running 3-4 weeks out at that time. Advised patient to contact me if she had further questions. cef

## 2012-04-20 ENCOUNTER — Telehealth (INDEPENDENT_AMBULATORY_CARE_PROVIDER_SITE_OTHER): Payer: Self-pay | Admitting: Surgery

## 2012-04-20 NOTE — Telephone Encounter (Signed)
I received the following email from the patient. I advised the patient that it would be fine for her to have the records sent in the future and to contact our office concerning scheduling a consultation for the removal of her lap band. cef  From: j Whitmill [[email or web address]]  Sent: Tuesday, April 20, 2012 11:10 AM To: Leandrew Koyanagi Subject: Re: Lap Band records and insurance changes  Okey Regal   Thank you,my insurance expires at the end of the month. I will need to find another job and get back with you. I will have the doctor that performed the surgery to send the papers to you once I am gainfully employed.  Thank you again  [facility]

## 2012-05-04 ENCOUNTER — Encounter: Payer: Self-pay | Admitting: Internal Medicine

## 2012-05-04 ENCOUNTER — Other Ambulatory Visit (INDEPENDENT_AMBULATORY_CARE_PROVIDER_SITE_OTHER): Payer: BC Managed Care – PPO

## 2012-05-04 ENCOUNTER — Ambulatory Visit (INDEPENDENT_AMBULATORY_CARE_PROVIDER_SITE_OTHER): Payer: BC Managed Care – PPO | Admitting: Internal Medicine

## 2012-05-04 VITALS — BP 132/86 | HR 87 | Temp 98.4°F | Resp 16 | Wt 268.4 lb

## 2012-05-04 DIAGNOSIS — J309 Allergic rhinitis, unspecified: Secondary | ICD-10-CM

## 2012-05-04 DIAGNOSIS — E785 Hyperlipidemia, unspecified: Secondary | ICD-10-CM

## 2012-05-04 DIAGNOSIS — I10 Essential (primary) hypertension: Secondary | ICD-10-CM

## 2012-05-04 DIAGNOSIS — E559 Vitamin D deficiency, unspecified: Secondary | ICD-10-CM

## 2012-05-04 DIAGNOSIS — R7309 Other abnormal glucose: Secondary | ICD-10-CM

## 2012-05-04 LAB — LIPID PANEL
Cholesterol: 170 mg/dL (ref 0–200)
HDL: 53.6 mg/dL (ref >39.00)
LDL Cholesterol: 101 mg/dL — ABNORMAL HIGH (ref 0–99)
Total CHOL/HDL Ratio: 3
Triglycerides: 77 mg/dL (ref 0.0–149.0)
VLDL: 15.4 mg/dL (ref 0.0–40.0)

## 2012-05-04 LAB — COMPREHENSIVE METABOLIC PANEL
ALT: 31 U/L (ref 0–35)
AST: 22 U/L (ref 0–37)
Albumin: 3.8 g/dL (ref 3.5–5.2)
Alkaline Phosphatase: 100 U/L (ref 39–117)
BUN: 10 mg/dL (ref 6–23)
CO2: 30 mEq/L (ref 19–32)
Calcium: 8.9 mg/dL (ref 8.4–10.5)
Chloride: 104 mEq/L (ref 96–112)
Creatinine, Ser: 0.7 mg/dL (ref 0.4–1.2)
GFR: 105.82 mL/min (ref >60.00)
Glucose, Bld: 85 mg/dL (ref 70–99)
Potassium: 3.7 mEq/L (ref 3.5–5.1)
Sodium: 141 mEq/L (ref 135–145)
Total Bilirubin: 0.6 mg/dL (ref 0.3–1.2)
Total Protein: 7.7 g/dL (ref 6.0–8.3)

## 2012-05-04 LAB — CBC WITH DIFFERENTIAL/PLATELET
Basophils Absolute: 0 10*3/uL (ref 0.0–0.1)
Basophils Relative: 0.5 % (ref 0.0–3.0)
Eosinophils Absolute: 0.1 10*3/uL (ref 0.0–0.7)
Eosinophils Relative: 1 % (ref 0.0–5.0)
HCT: 35.6 % — ABNORMAL LOW (ref 36.0–46.0)
Hemoglobin: 12.3 g/dL (ref 12.0–15.0)
Lymphocytes Relative: 19.2 % (ref 12.0–46.0)
Lymphs Abs: 1.8 10*3/uL (ref 0.7–4.0)
MCHC: 34.4 g/dL (ref 30.0–36.0)
MCV: 87.6 fl (ref 78.0–100.0)
Monocytes Absolute: 0.7 10*3/uL (ref 0.1–1.0)
Monocytes Relative: 6.9 % (ref 3.0–12.0)
Neutro Abs: 6.9 10*3/uL (ref 1.4–7.7)
Neutrophils Relative %: 72.4 % (ref 43.0–77.0)
Platelets: 266 10*3/uL (ref 150.0–400.0)
RBC: 4.07 Mil/uL (ref 3.87–5.11)
RDW: 15.5 % — ABNORMAL HIGH (ref 11.5–14.6)
WBC: 9.5 10*3/uL (ref 4.5–10.5)

## 2012-05-04 LAB — HEMOGLOBIN A1C: Hgb A1c MFr Bld: 5.8 % (ref 4.6–6.5)

## 2012-05-04 LAB — VITAMIN D 25 HYDROXY (VIT D DEFICIENCY, FRACTURES): Vit D, 25-Hydroxy: 24 ng/mL — ABNORMAL LOW (ref 30–89)

## 2012-05-04 LAB — TSH: TSH: 1.72 u[IU]/mL (ref 0.35–5.50)

## 2012-05-04 MED ORDER — CHOLECALCIFEROL 1.25 MG (50000 UT) PO TABS: 1.0000 | ORAL_TABLET | ORAL | Status: DC

## 2012-05-04 MED ORDER — MONTELUKAST SODIUM 10 MG PO TABS: 10.0000 mg | ORAL_TABLET | Freq: Every day | ORAL | Status: DC

## 2012-05-04 MED ORDER — METHYLPREDNISOLONE ACETATE 80 MG/ML IJ SUSP
120.0000 mg | Freq: Once | INTRAMUSCULAR | Status: AC
  Administered 2012-05-04: 120 mg via INTRAMUSCULAR

## 2012-05-04 MED ORDER — CETIRIZINE HCL 10 MG PO TABS: 10.0000 mg | ORAL_TABLET | Freq: Every day | ORAL | Status: DC

## 2012-05-04 MED ORDER — AZELASTINE HCL 0.1 % NA SOLN: 2.0000 | Freq: Two times a day (BID) | NASAL | Status: DC

## 2012-05-04 NOTE — Assessment & Plan Note (Signed)
She is having flare so I gave her a dose of depo-medrol IM She will add astelin ns, zyrtec, and singulair as well

## 2012-05-04 NOTE — Patient Instructions (Signed)

## 2012-05-04 NOTE — Progress Notes (Signed)
Subjective:    Patient ID: [name], female    DOB: 1955/12/23, 57 y.o.   MRN: [id number]  Allergic Reaction This is a recurrent problem. The current episode started more than 1 week ago. The problem occurs intermittently. The problem has been gradually worsening since onset. The problem is moderate. Associated symptoms include eye watering. Pertinent negatives include no abdominal pain, chest pain, chest pressure, coughing, diarrhea, difficulty breathing, drooling, eye itching, eye redness, globus sensation, hyperventilation, itching, rash, stridor, trouble swallowing, vomiting or wheezing. There is no swelling present. Past treatments include one or more prescription drugs. The treatment provided mild relief. Her past medical history is significant for seasonal allergies. There is no history of asthma, atopic dermatitis, food allergies or medication allergies.      Review of Systems  Constitutional: Negative.  Negative for fever, chills, diaphoresis, activity change, appetite change, fatigue and unexpected weight change.  HENT: Positive for nosebleeds, congestion, rhinorrhea, sneezing and postnasal drip. Negative for hearing loss, ear pain, sore throat, facial swelling, drooling, mouth sores, trouble swallowing, neck pain, neck stiffness, dental problem, voice change, sinus pressure, tinnitus and ear discharge.   Eyes: Negative.  Negative for photophobia, pain, discharge, redness, itching and visual disturbance.  Respiratory: Negative.  Negative for apnea, cough, choking, chest tightness, shortness of breath, wheezing and stridor.   Cardiovascular: Negative.  Negative for chest pain, palpitations and leg swelling.  Gastrointestinal: Negative.  Negative for nausea, vomiting, abdominal pain, diarrhea and constipation.  Endocrine: Negative.   Genitourinary: Negative.   Musculoskeletal: Negative.  Negative for myalgias, back pain, joint swelling and gait problem.  Skin: Negative.  Negative for  color change, itching, pallor, rash and wound.  Allergic/Immunologic: Positive for environmental allergies. Negative for food allergies and immunocompromised state.  Neurological: Negative.   Hematological: Negative.  Negative for adenopathy. Does not bruise/bleed easily.  Psychiatric/Behavioral: Negative.        Objective:   Physical Exam  Vitals reviewed. Constitutional: She is oriented to person, place, and time. She appears well-developed and well-nourished.  Non-toxic appearance. She does not have a sickly appearance. She does not appear ill. No distress.  HENT:  Head: Normocephalic and atraumatic. No trismus in the jaw.  Right Ear: Hearing, tympanic membrane, external ear and ear canal normal.  Left Ear: Hearing, tympanic membrane, external ear and ear canal normal.  Nose: Mucosal edema present. No rhinorrhea, nose lacerations, sinus tenderness, nasal deformity, septal deviation or nasal septal hematoma. No epistaxis.  No foreign bodies. Right sinus exhibits no maxillary sinus tenderness and no frontal sinus tenderness. Left sinus exhibits no maxillary sinus tenderness and no frontal sinus tenderness.  Mouth/Throat: Oropharynx is clear and moist. Mucous membranes are not pale, not dry and not cyanotic. No oral lesions. No edematous. No oropharyngeal exudate, posterior oropharyngeal edema, posterior oropharyngeal erythema or tonsillar abscesses.  Eyes: Conjunctivae are normal. Right eye exhibits no discharge. Left eye exhibits no discharge. No scleral icterus.  Neck: Normal range of motion. Neck supple. No JVD present. No tracheal deviation present. No thyromegaly present.  Cardiovascular: Normal rate, regular rhythm, normal heart sounds and intact distal pulses.  Exam reveals no gallop and no friction rub.   No murmur heard. Pulmonary/Chest: Effort normal and breath sounds normal. No stridor. No respiratory distress. She has no wheezes. She has no rales. She exhibits no tenderness.   Abdominal: Soft. Bowel sounds are normal. She exhibits no distension and no mass. There is no tenderness. There is no rebound and no guarding.  Musculoskeletal: Normal  range of motion. She exhibits no edema and no tenderness.  Lymphadenopathy:    She has no cervical adenopathy.  Neurological: She is oriented to person, place, and time.  Skin: Skin is warm and dry. No rash noted. She is not diaphoretic. No erythema. No pallor.  Psychiatric: She has a normal mood and affect. Her behavior is normal. Judgment and thought content normal.      Lab Results  Component Value Date   WBC 8.6 01/13/2011   HGB 13.0 01/13/2011   HCT 39.3 01/13/2011   PLT 249.0 01/13/2011   GLUCOSE 103* 12/12/2011   CHOL 186 01/13/2011   TRIG 98.0 01/13/2011   HDL 55.60 01/13/2011   LDLCALC 111* 01/13/2011   ALT 20 01/13/2011   AST 20 01/13/2011   NA 142 12/12/2011   K 3.5 12/12/2011   CL 101 12/12/2011   CREATININE 0.8 12/12/2011   BUN 15 12/12/2011   CO2 32 12/12/2011   TSH 1.17 01/13/2011   HGBA1C 5.6 01/13/2011      Assessment & Plan:

## 2012-05-04 NOTE — Assessment & Plan Note (Signed)
Her BP is well controlled Today I will check her lytes and renal function 

## 2012-05-04 NOTE — Assessment & Plan Note (Signed)
I will check her FLP today and will treat if it is indicated

## 2012-05-04 NOTE — Assessment & Plan Note (Signed)
I will check her A1C today to see if she has developed DM2 

## 2012-05-04 NOTE — Assessment & Plan Note (Signed)
Her level is still low so I have raised her dose

## 2012-05-10 ENCOUNTER — Ambulatory Visit: Payer: BC Managed Care – PPO | Admitting: Internal Medicine

## 2012-05-28 ENCOUNTER — Other Ambulatory Visit: Payer: Self-pay

## 2012-05-28 DIAGNOSIS — J309 Allergic rhinitis, unspecified: Secondary | ICD-10-CM

## 2012-05-28 MED ORDER — MONTELUKAST SODIUM 10 MG PO TABS: 10.0000 mg | ORAL_TABLET | Freq: Every day | ORAL | Status: DC

## 2012-05-28 MED ORDER — AZELASTINE HCL 0.1 % NA SOLN: 2.0000 | Freq: Two times a day (BID) | NASAL | Status: DC

## 2012-09-03 ENCOUNTER — Ambulatory Visit: Payer: BC Managed Care – PPO | Admitting: Internal Medicine

## 2012-10-28 ENCOUNTER — Encounter: Payer: Self-pay | Admitting: Internal Medicine

## 2012-10-28 ENCOUNTER — Other Ambulatory Visit (INDEPENDENT_AMBULATORY_CARE_PROVIDER_SITE_OTHER): Payer: PRIVATE HEALTH INSURANCE

## 2012-10-28 ENCOUNTER — Ambulatory Visit (INDEPENDENT_AMBULATORY_CARE_PROVIDER_SITE_OTHER): Payer: PRIVATE HEALTH INSURANCE | Admitting: Internal Medicine

## 2012-10-28 VITALS — BP 142/84 | HR 91 | Temp 98.4°F | Resp 16 | Ht 63.0 in | Wt 271.5 lb

## 2012-10-28 DIAGNOSIS — R7309 Other abnormal glucose: Secondary | ICD-10-CM

## 2012-10-28 DIAGNOSIS — I10 Essential (primary) hypertension: Secondary | ICD-10-CM

## 2012-10-28 DIAGNOSIS — E559 Vitamin D deficiency, unspecified: Secondary | ICD-10-CM

## 2012-10-28 DIAGNOSIS — E876 Hypokalemia: Secondary | ICD-10-CM

## 2012-10-28 DIAGNOSIS — Z23 Encounter for immunization: Secondary | ICD-10-CM

## 2012-10-28 LAB — BASIC METABOLIC PANEL
BUN: 17 mg/dL (ref 6–23)
CO2: 31 mEq/L (ref 19–32)
Calcium: 9.2 mg/dL (ref 8.4–10.5)
Chloride: 100 mEq/L (ref 96–112)
Creatinine, Ser: 1 mg/dL (ref 0.4–1.2)
GFR: 75.2 mL/min (ref >60.00)
Glucose, Bld: 101 mg/dL — ABNORMAL HIGH (ref 70–99)
Potassium: 3.4 mEq/L — ABNORMAL LOW (ref 3.5–5.1)
Sodium: 140 mEq/L (ref 135–145)

## 2012-10-28 LAB — MAGNESIUM: Magnesium: 1.8 mg/dL (ref 1.5–2.5)

## 2012-10-28 NOTE — Patient Instructions (Signed)
Meralgia Paresthetica  Meralgia paresthetica (MP) is a disorder characterized by tingling, numbness, and burning pain in the outer side of the thigh. It occurs in men more than women. MP is generally found in middle-aged or overweight people. Sometimes, the disorder may disappear. CAUSES The disorder is caused by a nerve in the thigh being squeezed (compressed). MP may be associated with tight clothing, pregnancy, diabetes, and being overweight (obese). SYMPTOMS  Tingling, numbness, and burning in the outer thigh.  An area of the skin may be painful and sensitive to the touch. The symptoms often worsen after walking or standing. TREATMENT  Treatment is based on your symptoms and is mainly supportive. Treatment may include:  Wearing looser clothing.  Losing weight.  Avoiding prolonged standing or walking.  Taking medication.  Surgery if the pain is peristent or severe. MP usually eases or disappears after treatment. Surgery is not always fully successful. Document Released: 12/13/2001 Document Revised: 03/17/2011 Document Reviewed: 12/23/2004 [facility] Patient Information 2014 [place], [place].

## 2012-10-28 NOTE — Progress Notes (Signed)
Subjective:    Patient ID: [name], female    DOB: [birth year], 57 y.o.   MRN: [id number]  Hypertension This is a chronic problem. The current episode started more than 1 year ago. The problem is unchanged. The problem is controlled. Pertinent negatives include no anxiety, blurred vision, chest pain, headaches, neck pain, orthopnea, palpitations, peripheral edema, PND, shortness of breath or sweats. Agents associated with hypertension include NSAIDs. Past treatments include diuretics. The current treatment provides moderate improvement. Compliance problems include exercise and diet.  Identifiable causes of hypertension include sleep apnea.      Review of Systems  Constitutional: Positive for unexpected weight change (wt gain). Negative for fever, chills, diaphoresis, appetite change and fatigue.  HENT: Negative.   Eyes: Negative.  Negative for blurred vision.  Respiratory: Positive for apnea. Negative for cough, choking, chest tightness, shortness of breath, wheezing and stridor.   Cardiovascular: Negative.  Negative for chest pain, palpitations, orthopnea, leg swelling and PND.  Gastrointestinal: Negative.  Negative for nausea, vomiting, abdominal pain, diarrhea, constipation and blood in stool.  Endocrine: Negative.  Negative for polydipsia, polyphagia and polyuria.  Genitourinary: Negative.   Musculoskeletal: Positive for arthralgias (both knees). Negative for back pain, gait problem, joint swelling, myalgias, neck pain and neck stiffness.  Skin: Negative.  Negative for color change, pallor, rash and wound.  Allergic/Immunologic: Negative.   Neurological: Positive for numbness (right thigh numbness over the anterior/lateral area). Negative for dizziness, tremors, seizures, syncope, facial asymmetry, speech difficulty, weakness, light-headedness and headaches.  Hematological: Negative.  Negative for adenopathy. Does not bruise/bleed easily.  Psychiatric/Behavioral: Negative.         Objective:   Physical Exam  Constitutional: She is oriented to person, place, and time. She appears well-developed and well-nourished. No distress.  HENT:  Head: Normocephalic and atraumatic.  Mouth/Throat: Oropharynx is clear and moist. No oropharyngeal exudate.  Eyes: Conjunctivae are normal. Right eye exhibits no discharge. Left eye exhibits no discharge. No scleral icterus.  Neck: Normal range of motion. Neck supple. No JVD present. No tracheal deviation present. No thyromegaly present.  Cardiovascular: Normal rate, regular rhythm, normal heart sounds and intact distal pulses.  Exam reveals no gallop and no friction rub.   No murmur heard. Pulmonary/Chest: Effort normal and breath sounds normal. No stridor. No respiratory distress. She has no wheezes. She has no rales. She exhibits no tenderness.  Abdominal: Soft. Bowel sounds are normal. She exhibits no distension and no mass. There is no tenderness. There is no rebound and no guarding.  Musculoskeletal: Normal range of motion. She exhibits no edema and no tenderness.  Lymphadenopathy:    She has no cervical adenopathy.  Neurological: She is oriented to person, place, and time.  Skin: Skin is warm and dry. No rash noted. She is not diaphoretic. No erythema. No pallor.  Psychiatric: She has a normal mood and affect. Her behavior is normal. Judgment and thought content normal.     Lab Results  Component Value Date   WBC 9.5 05/04/2012   HGB 12.3 05/04/2012   HCT 35.6* 05/04/2012   PLT 266.0 05/04/2012   GLUCOSE 85 05/04/2012   CHOL 170 05/04/2012   TRIG 77.0 05/04/2012   HDL 53.60 05/04/2012   LDLCALC 101* 05/04/2012   ALT 31 05/04/2012   AST 22 05/04/2012   NA 141 05/04/2012   K 3.7 05/04/2012   CL 104 05/04/2012   CREATININE 0.7 05/04/2012   BUN 10 05/04/2012   CO2 30 05/04/2012   TSH 1.72  05/04/2012   HGBA1C 5.8 05/04/2012       Assessment & Plan:

## 2012-10-29 ENCOUNTER — Encounter: Payer: Self-pay | Admitting: Internal Medicine

## 2012-10-29 LAB — VITAMIN D 25 HYDROXY (VIT D DEFICIENCY, FRACTURES): Vit D, 25-Hydroxy: 26 ng/mL — ABNORMAL LOW (ref 30–89)

## 2012-10-30 ENCOUNTER — Other Ambulatory Visit: Payer: Self-pay | Admitting: Internal Medicine

## 2012-10-31 ENCOUNTER — Encounter: Payer: Self-pay | Admitting: Internal Medicine

## 2012-10-31 NOTE — Assessment & Plan Note (Signed)
I will check her A1C to see if she has developed DM2 

## 2012-10-31 NOTE — Assessment & Plan Note (Signed)
She will improve her compliance with the K+ replacement therapy

## 2012-10-31 NOTE — Assessment & Plan Note (Signed)
I have asked her to restart Vit D replacement therapy

## 2012-10-31 NOTE — Assessment & Plan Note (Signed)
Her BP is well controlled Today I will check her lytes and renal function 

## 2012-11-04 ENCOUNTER — Encounter: Payer: Self-pay | Admitting: Pulmonary Disease

## 2012-11-04 ENCOUNTER — Ambulatory Visit (INDEPENDENT_AMBULATORY_CARE_PROVIDER_SITE_OTHER): Payer: PRIVATE HEALTH INSURANCE | Admitting: Pulmonary Disease

## 2012-11-04 VITALS — BP 138/76 | HR 88 | Temp 97.9°F | Ht 63.0 in | Wt 271.2 lb

## 2012-11-04 DIAGNOSIS — G4733 Obstructive sleep apnea (adult) (pediatric): Secondary | ICD-10-CM

## 2012-11-04 NOTE — Patient Instructions (Signed)
Nasal pad for full face mask Trial of melatonin 5mg  x 1 week 2h priro to bedtime , if not effect then increase to 2 tabs (10mg ) x 1 week , then call to report

## 2012-11-04 NOTE — Assessment & Plan Note (Signed)
Nasal pad for full face mask Trial of melatonin 5mg  x 1 week 2h priro to bedtime , if not effect then increase to 2 tabs (10mg ) x 1 week , then call to report If insomnia not better,  Can try lunesta -low dose  Weight loss encouraged, compliance with goal of at least 4-6 hrs every night is the expectation. Advised against medications with sedative side effects Cautioned against driving when sleepy - understanding that sleepiness will vary on a day to day basis

## 2012-11-04 NOTE — Progress Notes (Signed)
  Subjective:    Patient ID: [name], female    DOB: 12-14-1955, 57 y.o.   MRN: [id number]  HPI PCP - [name]   57/f, for FU of obstructive sleep apnea  PSG '06 [facility])>> severe OSA with AHI 52/h including 50 obstructive apneas & 81 hypopneas corrected by 14 cm , nadir desatn to 69%.  She required ERCP for acute pancreatitis foll by resp failure requiring tstomy  Lap band sx in dec'10  Jan '12 band flipped, required surgery, Had lost 60, gained 30 lbs back  TSH nml  She underwent TAH + BSO in '92  PSG 2/13 - 269 pounds with a height of 5 feet 3 inches. BMI of 48. Neck size of 15.5 inches.  Severe obstructive sleep apnea with hypopneas AHI 56/h causing sleep problems, fragmentation, and oxygen desaturation. This was corrected by an optimal CPAP of 15 cm with a small full- face mask, C flex 2.   Download [name]) 2/28- 04/07/11 shows good compliance, residual Ahi 3/h, 30 mins avg leak daily On 15 cm      11/04/2012  Chief Complaint  Patient presents with  . Follow-up    Pt reports she wears CPAP everynight x 6-7 hrs a night. Pt reports she is having to always adjust the mask. Since last week she has not had the air seep out of mask. The straps makes dents on face and nose.    wears cpap everynight x 6 hrs a night. c/o mask is ucomfortable-has a scar on face from mask. Not rested , frequent awakenings - with long post void latency, Sunday night are esp bad  Z-quil & advil pm for sleep  Pressure ok Review of Systems neg for any significant sore throat, dysphagia, itching, sneezing, nasal congestion or excess/ purulent secretions, fever, chills, sweats, unintended wt loss, pleuritic or exertional cp, hempoptysis, orthopnea pnd or change in chronic leg swelling. Also denies presyncope, palpitations, heartburn, abdominal pain, nausea, vomiting, diarrhea or change in bowel or urinary habits, dysuria,hematuria, rash, arthralgias, visual complaints, headache, numbness weakness or  ataxia.     Objective:   Physical Exam  Gen. Pleasant, obese, in no distress ENT - no lesions, no post nasal drip Neck: No JVD, no thyromegaly, no carotid bruits Lungs: no use of accessory muscles, no dullness to percussion, decreased without rales or rhonchi  Cardiovascular: Rhythm regular, heart sounds  normal, no murmurs or gallops, no peripheral edema Musculoskeletal: No deformities, no cyanosis or clubbing , no tremors       Assessment & Plan:

## 2012-12-28 ENCOUNTER — Ambulatory Visit (INDEPENDENT_AMBULATORY_CARE_PROVIDER_SITE_OTHER): Payer: PRIVATE HEALTH INSURANCE | Admitting: Internal Medicine

## 2012-12-28 ENCOUNTER — Other Ambulatory Visit (INDEPENDENT_AMBULATORY_CARE_PROVIDER_SITE_OTHER): Payer: PRIVATE HEALTH INSURANCE

## 2012-12-28 ENCOUNTER — Ambulatory Visit: Payer: PRIVATE HEALTH INSURANCE | Admitting: Internal Medicine

## 2012-12-28 ENCOUNTER — Encounter: Payer: Self-pay | Admitting: Internal Medicine

## 2012-12-28 VITALS — BP 118/72 | HR 92 | Temp 98.3°F | Resp 16 | Ht 63.0 in | Wt 276.0 lb

## 2012-12-28 DIAGNOSIS — I10 Essential (primary) hypertension: Secondary | ICD-10-CM

## 2012-12-28 DIAGNOSIS — IMO0002 Reserved for concepts with insufficient information to code with codable children: Secondary | ICD-10-CM

## 2012-12-28 DIAGNOSIS — R7309 Other abnormal glucose: Secondary | ICD-10-CM

## 2012-12-28 DIAGNOSIS — S83206A Unspecified tear of unspecified meniscus, current injury, right knee, initial encounter: Secondary | ICD-10-CM

## 2012-12-28 DIAGNOSIS — R41840 Attention and concentration deficit: Secondary | ICD-10-CM

## 2012-12-28 DIAGNOSIS — E876 Hypokalemia: Secondary | ICD-10-CM

## 2012-12-28 DIAGNOSIS — S83206S Unspecified tear of unspecified meniscus, current injury, right knee, sequela: Secondary | ICD-10-CM

## 2012-12-28 DIAGNOSIS — S83209A Unspecified tear of unspecified meniscus, current injury, unspecified knee, initial encounter: Secondary | ICD-10-CM | POA: Insufficient documentation

## 2012-12-28 LAB — BASIC METABOLIC PANEL
BUN: 12 mg/dL (ref 6–23)
CO2: 29 mEq/L (ref 19–32)
Calcium: 9.1 mg/dL (ref 8.4–10.5)
Chloride: 104 mEq/L (ref 96–112)
Creatinine, Ser: 0.7 mg/dL (ref 0.4–1.2)
GFR: 105.57 mL/min (ref >60.00)
Glucose, Bld: 94 mg/dL (ref 70–99)
Potassium: 3.8 mEq/L (ref 3.5–5.1)
Sodium: 140 mEq/L (ref 135–145)

## 2012-12-28 MED ORDER — METHYLPREDNISOLONE ACETATE 40 MG/ML IJ SUSP
40.0000 mg | Freq: Once | INTRAMUSCULAR | Status: AC
  Administered 2012-12-28: 40 mg via INTRA_ARTICULAR

## 2012-12-28 NOTE — Assessment & Plan Note (Signed)
Her BP is well controlled 

## 2012-12-28 NOTE — Patient Instructions (Signed)

## 2012-12-28 NOTE — Assessment & Plan Note (Signed)
She was given an injection of depo-medrol and was given an exercise regimen as well

## 2012-12-28 NOTE — Assessment & Plan Note (Signed)
I will check her A1C to see if she has developed DM2 

## 2012-12-28 NOTE — Assessment & Plan Note (Signed)
I will recheck her K+ level today and will advise further if needed

## 2012-12-28 NOTE — Assessment & Plan Note (Signed)
I have asked her to see psychology for further evaluation

## 2012-12-28 NOTE — Progress Notes (Signed)
Subjective:    Patient ID: [name], female    DOB: 03-Sep-1955, 57 y.o.   MRN: [id number]  Arthritis Presents for follow-up visit. The disease course has been worsening. The condition has lasted for 1 year. She complains of pain. She reports no stiffness, joint swelling or joint warmth. Affected locations include the right knee. Her pain is at a severity of 4/10. Pertinent negatives include no diarrhea, dry eyes, dry mouth, dysuria, fatigue, fever, pain at night, pain while resting, rash, Raynaud's syndrome, uveitis or weight loss. Her past medical history is significant for osteoarthritis. Her pertinent risk factors include overuse. Past treatments include NSAIDs. The treatment provided mild relief. Factors aggravating her arthritis include activity. Compliance with prior treatments has been variable.      Review of Systems  Constitutional: Negative.  Negative for fever, weight loss and fatigue.  HENT: Negative.   Eyes: Negative.   Respiratory: Negative.  Negative for cough, choking, chest tightness, shortness of breath and stridor.   Cardiovascular: Negative.  Negative for chest pain, palpitations and leg swelling.  Gastrointestinal: Negative.  Negative for abdominal pain and diarrhea.  Endocrine: Negative.   Genitourinary: Negative.  Negative for dysuria.  Musculoskeletal: Positive for arthralgias and arthritis. Negative for back pain, joint swelling, myalgias, neck pain, neck stiffness and stiffness.  Skin: Negative.  Negative for rash.  Allergic/Immunologic: Negative.   Neurological: Negative.  Negative for dizziness.  Hematological: Negative.  Negative for adenopathy. Does not bruise/bleed easily.  Psychiatric/Behavioral: Positive for decreased concentration. Negative for suicidal ideas, hallucinations, behavioral problems, confusion, sleep disturbance, self-injury, dysphoric mood and agitation. The patient is not nervous/anxious and is not hyperactive.        Objective:   Physical Exam  Vitals reviewed. Constitutional: She is oriented to person, place, and time. She appears well-developed and well-nourished. No distress.  HENT:  Head: Normocephalic and atraumatic.  Mouth/Throat: Oropharynx is clear and moist. No oropharyngeal exudate.  Eyes: Conjunctivae are normal. Right eye exhibits no discharge. Left eye exhibits no discharge. No scleral icterus.  Neck: Normal range of motion. Neck supple. No JVD present. No tracheal deviation present. No thyromegaly present.  Cardiovascular: Normal rate, regular rhythm, normal heart sounds and intact distal pulses.  Exam reveals no gallop and no friction rub.   No murmur heard. Pulmonary/Chest: Effort normal and breath sounds normal. No stridor. No respiratory distress. She has no wheezes. She has no rales. She exhibits no tenderness.  Abdominal: Soft. Bowel sounds are normal. She exhibits no distension and no mass. There is no tenderness. There is no rebound and no guarding.  Musculoskeletal: Normal range of motion. She exhibits no edema and no tenderness.       Right knee: She exhibits normal range of motion, no swelling, no effusion, no ecchymosis, no deformity, no laceration, no erythema, normal alignment, no LCL laxity and no bony tenderness.  Rt knee was cleaned with betadine then from the medial approach I injected 1 cc plain marcaine 0.5% and 40 mg depo-medrol into the joint space. She tolerated it well.  Lymphadenopathy:    She has no cervical adenopathy.  Neurological: She is oriented to person, place, and time.  Skin: Skin is warm and dry. No rash noted. She is not diaphoretic. No erythema. No pallor.     Lab Results  Component Value Date   WBC 9.5 05/04/2012   HGB 12.3 05/04/2012   HCT 35.6* 05/04/2012   PLT 266.0 05/04/2012   GLUCOSE 101* 10/28/2012   CHOL 170 05/04/2012  TRIG 77.0 05/04/2012   HDL 53.60 05/04/2012   LDLCALC 101* 05/04/2012   ALT 31 05/04/2012   AST 22 05/04/2012   NA 140 10/28/2012   K  3.4* 10/28/2012   CL 100 10/28/2012   CREATININE 1.0 10/28/2012   BUN 17 10/28/2012   CO2 31 10/28/2012   TSH 1.72 05/04/2012   HGBA1C 5.8 05/04/2012       Assessment & Plan:

## 2012-12-28 NOTE — Progress Notes (Signed)
Pre visit review using our clinic review tool, if applicable. No additional management support is needed unless otherwise documented below in the visit note. 

## 2012-12-31 ENCOUNTER — Encounter: Payer: Self-pay | Admitting: Internal Medicine

## 2012-12-31 LAB — HEMOGLOBIN A1C: Hgb A1c MFr Bld: 5.9 % (ref 4.6–6.5)

## 2013-01-03 ENCOUNTER — Ambulatory Visit: Payer: PRIVATE HEALTH INSURANCE | Admitting: Family Medicine

## 2013-01-05 ENCOUNTER — Other Ambulatory Visit (INDEPENDENT_AMBULATORY_CARE_PROVIDER_SITE_OTHER): Payer: PRIVATE HEALTH INSURANCE

## 2013-01-05 ENCOUNTER — Ambulatory Visit (INDEPENDENT_AMBULATORY_CARE_PROVIDER_SITE_OTHER): Payer: PRIVATE HEALTH INSURANCE | Admitting: Family Medicine

## 2013-01-05 ENCOUNTER — Encounter: Payer: Self-pay | Admitting: Family Medicine

## 2013-01-05 VITALS — BP 126/72 | HR 80 | Wt 272.0 lb

## 2013-01-05 DIAGNOSIS — S83206S Unspecified tear of unspecified meniscus, current injury, right knee, sequela: Secondary | ICD-10-CM

## 2013-01-05 DIAGNOSIS — IMO0002 Reserved for concepts with insufficient information to code with codable children: Secondary | ICD-10-CM

## 2013-01-05 NOTE — Assessment & Plan Note (Addendum)
Patient does have what appears to be a very small degenerative change of the medial meniscus of the right knee. Patient was given a brace was fitted by me today. Showed proper alignment with the kneecap and will work on patellofemoral syndrome. Discuss weight loss numbness will be beneficial. Home exercise program given. Discussed icing rtc in 3 weeks

## 2013-01-05 NOTE — Patient Instructions (Addendum)
Very nice to meet you Try exercises daily or most days of the week. We need to focus on quadracep strengthening and hamstring stretching  Wear brace with activty and can wear during day if feels good.  Ice 20 minutes after activity Consider biking or swimming and wallsits would be good. No Zumba for next 3 weeks.  Come back and see me again in 3 weeks to make sure you are doing better.

## 2013-01-05 NOTE — Progress Notes (Signed)
  I'm seeing this patient by the request  of:  [name], MD   CC: Right knee pain  HPI: Patient is a very pleasant 57 year old female coming in with right knee pain. Patient states that she has had right knee pain for approximately 1 year. She complains mostly of having pain. Patient states that primary care provider did give her an injection and this did help decrease the pain by 50%. Patient does have goals of getting in better shape this year and would like to be evaluated further. Patient denies any radiation or numbness but still having significant pain going up or down stairs. Patient also has tried to do some classes at the gym but unfortunately the twisting motion gives her increasing pain on the medial and lateral aspects of the knee. Denies any radiation or any weakness of the extremity. Patient rates the pain severity so a 5/10 denies any nighttime awakening.  Past medical, surgical, family and social history reviewed. Medications reviewed all in the electronic medical record.   Review of Systems: No headache, visual changes, nausea, vomiting, diarrhea, constipation, dizziness, abdominal pain, skin rash, fevers, chills, night sweats, weight loss, swollen lymph nodes, body aches, joint swelling, muscle aches, chest pain, shortness of breath, mood changes.   Objective:    Blood pressure 126/72, pulse 80, weight 272 lb (123.378 kg), SpO2 97.00%.   General: No apparent distress alert and oriented x3 mood and affect normal, dressed appropriately.  HEENT: Pupils equal, extraocular movements intact Respiratory: Patient's speak in full sentences and does not appear short of breath Cardiovascular: No lower extremity edema, non tender, no erythema Skin: Warm dry intact with no signs of infection or rash on extremities or on axial skeleton. Abdomen: Soft nontender Neuro: Cranial nerves II through XII are intact, neurovascularly intact in all extremities with 2+ DTRs and 2+ pulses. Lymph: No  lymphadenopathy of posterior or anterior cervical chain or axillae bilaterally.  Gait normal with good balance and coordination.  MSK: Non tender with full range of motion and good stability and symmetric strength and tone of shoulders, elbows, wrist, hip,  and ankles bilaterally.  Knee:  right Normal to inspection with no erythema or effusion or obvious bony abnormalities. Palpation normal with no warmth, joint line tenderness, patellar tenderness, or condyle tenderness. ROM full in flexion and extension and lower leg rotation. Ligaments with solid consistent endpoints including ACL, PCL, LCL, MCL. equivical Mcmurray's, Apley's, and Thessalonian tests. Non painful patellar compression. Patellar glide with moderate crepitus. Patellar and quadriceps tendons unremarkable. Hamstring and quadriceps strength is normal.  Contralateral knee unremarkable  MSK US performed of: right knee  This study was ordered, performed, and interpreted by Terrilee Files D.O.  Knee: All structures visualized. Anterolateral, posteromedial, and posterolateral menisci unremarkable without tearing, fraying, effusion, or displacement. Anterior medial meniscus does have what appears to be a degenerative tear. There is no displacement. Patellar Tendon unremarkable on long and transverse views without effusion. No abnormality of prepatellar bursa. LCL and MCL unremarkable on long and transverse views. No abnormality of origin of medial or lateral head of the gastrocnemius.  IMPRESSION:  Degenerative change of the anterior medial meniscus.   Impression and Recommendations:     This case required medical decision making of moderate complexity.

## 2013-01-05 NOTE — Progress Notes (Signed)
Pre-visit discussion using our clinic review tool. No additional management support is needed unless otherwise documented below in the visit note.  

## 2013-01-09 ENCOUNTER — Other Ambulatory Visit: Payer: Self-pay | Admitting: Internal Medicine

## 2013-01-14 ENCOUNTER — Ambulatory Visit: Payer: PRIVATE HEALTH INSURANCE | Admitting: Internal Medicine

## 2013-01-26 ENCOUNTER — Ambulatory Visit: Payer: PRIVATE HEALTH INSURANCE | Admitting: Family Medicine

## 2013-02-07 ENCOUNTER — Ambulatory Visit: Payer: PRIVATE HEALTH INSURANCE | Admitting: Licensed Clinical Social Worker

## 2013-02-09 ENCOUNTER — Ambulatory Visit: Payer: PRIVATE HEALTH INSURANCE | Admitting: Family Medicine

## 2013-02-28 ENCOUNTER — Telehealth: Payer: Self-pay | Admitting: Internal Medicine

## 2013-02-28 NOTE — Telephone Encounter (Signed)
Yes, that is ok 

## 2013-02-28 NOTE — Telephone Encounter (Signed)
[facility] is calling in regards to the patient's Vitamin D rx. They are trying to refill her rx but need to know whether it is okay for them to fill it with Vitamin D2 instead of the Vitamin D3. States that she will still be getting the same amount of mg's. Please advise.

## 2013-02-28 NOTE — Telephone Encounter (Signed)
Pharmacy notified.

## 2013-03-17 ENCOUNTER — Telehealth: Payer: Self-pay | Admitting: *Deleted

## 2013-03-17 NOTE — Telephone Encounter (Signed)
Phoned pharmacist and relayed MD approval.

## 2013-03-17 NOTE — Telephone Encounter (Signed)
yes

## 2013-03-17 NOTE — Telephone Encounter (Signed)
Hipolito BayleyAlex, RPh, phoned to clarify PCP approval for dispensing vitamin D2 instead of vitamin D3-same 50,000 units.  Please advise.  CB# [phone]

## 2013-03-29 ENCOUNTER — Other Ambulatory Visit (INDEPENDENT_AMBULATORY_CARE_PROVIDER_SITE_OTHER): Payer: PRIVATE HEALTH INSURANCE

## 2013-03-29 ENCOUNTER — Ambulatory Visit (INDEPENDENT_AMBULATORY_CARE_PROVIDER_SITE_OTHER): Payer: PRIVATE HEALTH INSURANCE | Admitting: Family Medicine

## 2013-03-29 ENCOUNTER — Encounter: Payer: Self-pay | Admitting: Family Medicine

## 2013-03-29 VITALS — BP 144/90 | HR 88

## 2013-03-29 DIAGNOSIS — M7631 Iliotibial band syndrome, right leg: Secondary | ICD-10-CM | POA: Insufficient documentation

## 2013-03-29 DIAGNOSIS — M25561 Pain in right knee: Secondary | ICD-10-CM

## 2013-03-29 DIAGNOSIS — M25569 Pain in unspecified knee: Secondary | ICD-10-CM

## 2013-03-29 DIAGNOSIS — M2420 Disorder of ligament, unspecified site: Secondary | ICD-10-CM

## 2013-03-29 DIAGNOSIS — M629 Disorder of muscle, unspecified: Secondary | ICD-10-CM

## 2013-03-29 MED ORDER — MELOXICAM 15 MG PO TABS: 15.0000 mg | ORAL_TABLET | Freq: Every day | ORAL | Status: DC

## 2013-03-29 NOTE — Assessment & Plan Note (Signed)
I reviewed with the patient the anatomy involved with ITB syndrome.  Given rehab program  - primarily ITB stretching program, core stability program, gluteus medius strengthening, hip flexion strengthening, and core. Additional proprioception and mild plyometrics program reviewed. Meloxicam daily as needed and to avoid anti-inflammatories such as ibuprofen over-the-counter. We discussed weight loss and the importance of this.  Reviewed plan of care and rehab is the primary treatment in this condition. The patient is not better in 4 weeks we'll have her come back. Patient does have a past medical history significant for migraines we will not be able to do nitroglycerin but we may consider a distal ITB an injection if necessary.

## 2013-03-29 NOTE — Progress Notes (Signed)
  I'm seeing this patient by the request  of:  Sanda Lingerhomas Jones, MD   CC: Right knee pain follow up  HPI: Patient is a very pleasant 58 year old female coming in with right knee pain. Patient states that she is still having some mild right knee pain but now more on the lateral aspect of the medial aspect. Patient states it seems to radiate on the posterior aspect of her leg from time to time. Patient has not been doing any significant exercises but would like to start doing more. Patient states clicking and popping and well as the locking is getting much better. Past medical, surgical, family and social history reviewed. Medications reviewed all in the electronic medical record.   Review of Systems: No headache, visual changes, nausea, vomiting, diarrhea, constipation, dizziness, abdominal pain, skin rash, fevers, chills, night sweats, weight loss, swollen lymph nodes, body aches, joint swelling, muscle aches, chest pain, shortness of breath, mood changes.   Objective:    Blood pressure 144/90, pulse 88, SpO2 93.00%.   General: No apparent distress alert and oriented x3 mood and affect normal, dressed appropriately. obese HEENT: Pupils equal, extraocular movements intact Respiratory: Patient's speak in full sentences and does not appear short of breath Cardiovascular: No lower extremity edema, non tender, no erythema Skin: Warm dry intact with no signs of infection or rash on extremities or on axial skeleton. Abdomen: Soft nontender Neuro: Cranial nerves II through XII are intact, neurovascularly intact in all extremities with 2+ DTRs and 2+ pulses. Lymph: No lymphadenopathy of posterior or anterior cervical chain or axillae bilaterally.  Gait normal with good balance and coordination.  MSK: Non tender with full range of motion and good stability and symmetric strength and tone of shoulders, elbows, wrist, hip,  and ankles bilaterally.  Knee:  right Normal to inspection with no erythema or  effusion or obvious bony abnormalities. Palpation normal with no warmth,, patellar tenderness, or condyle tenderness. ROM full in flexion and extension and lower leg rotation. Ligaments with solid consistent endpoints including ACL, PCL, LCL, MCL. Patient is tender to palpation over the lateral joint line as well as the distal ITB and. She has a minimal tenderness over the medial joint line. equivical Mcmurray's, Apley's, and Thessalonian tests. Non painful patellar compression. Patellar glide with moderate crepitus. Patellar and quadriceps tendons unremarkable. Hamstring and quadriceps strength is normal.  Contralateral knee unremarkable   Impression and Recommendations:     This case required medical decision making of moderate complexity.

## 2013-03-29 NOTE — Patient Instructions (Signed)
Good to see you Try the exercises for IT band 3 times a week meloxicam daily can help Ice 20 minutes 2 times a day Come back in 4 weeks if not perfect.

## 2013-04-13 ENCOUNTER — Encounter: Payer: Self-pay | Admitting: Internal Medicine

## 2013-04-13 ENCOUNTER — Other Ambulatory Visit (INDEPENDENT_AMBULATORY_CARE_PROVIDER_SITE_OTHER): Payer: PRIVATE HEALTH INSURANCE

## 2013-04-13 ENCOUNTER — Ambulatory Visit (INDEPENDENT_AMBULATORY_CARE_PROVIDER_SITE_OTHER): Payer: PRIVATE HEALTH INSURANCE | Admitting: Internal Medicine

## 2013-04-13 VITALS — BP 120/84 | HR 97 | Temp 98.5°F | Resp 16 | Ht 63.0 in | Wt 272.0 lb

## 2013-04-13 DIAGNOSIS — L301 Dyshidrosis [pompholyx]: Secondary | ICD-10-CM | POA: Insufficient documentation

## 2013-04-13 DIAGNOSIS — B351 Tinea unguium: Secondary | ICD-10-CM | POA: Insufficient documentation

## 2013-04-13 DIAGNOSIS — R7309 Other abnormal glucose: Secondary | ICD-10-CM

## 2013-04-13 DIAGNOSIS — E785 Hyperlipidemia, unspecified: Secondary | ICD-10-CM

## 2013-04-13 DIAGNOSIS — I10 Essential (primary) hypertension: Secondary | ICD-10-CM

## 2013-04-13 LAB — BASIC METABOLIC PANEL
BUN: 12 mg/dL (ref 6–23)
CO2: 31 mEq/L (ref 19–32)
Calcium: 9.3 mg/dL (ref 8.4–10.5)
Chloride: 101 mEq/L (ref 96–112)
Creatinine, Ser: 0.7 mg/dL (ref 0.4–1.2)
GFR: 105.46 mL/min (ref >60.00)
Glucose, Bld: 97 mg/dL (ref 70–99)
Potassium: 3.6 mEq/L (ref 3.5–5.1)
Sodium: 140 mEq/L (ref 135–145)

## 2013-04-13 LAB — CBC WITH DIFFERENTIAL/PLATELET
Basophils Absolute: 0.1 10*3/uL (ref 0.0–0.1)
Basophils Relative: 1.2 % (ref 0.0–3.0)
Eosinophils Absolute: 0.1 10*3/uL (ref 0.0–0.7)
Eosinophils Relative: 1.6 % (ref 0.0–5.0)
HCT: 37.2 % (ref 36.0–46.0)
Hemoglobin: 12.3 g/dL (ref 12.0–15.0)
Lymphocytes Relative: 23.5 % (ref 12.0–46.0)
Lymphs Abs: 2.1 10*3/uL (ref 0.7–4.0)
MCHC: 33.2 g/dL (ref 30.0–36.0)
MCV: 87.3 fl (ref 78.0–100.0)
Monocytes Absolute: 0.5 10*3/uL (ref 0.1–1.0)
Monocytes Relative: 6.1 % (ref 3.0–12.0)
Neutro Abs: 6 10*3/uL (ref 1.4–7.7)
Neutrophils Relative %: 67.6 % (ref 43.0–77.0)
Platelets: 323 10*3/uL (ref 150.0–400.0)
RBC: 4.26 Mil/uL (ref 3.87–5.11)
RDW: 14.9 % — ABNORMAL HIGH (ref 11.5–14.6)
WBC: 8.9 10*3/uL (ref 4.5–10.5)

## 2013-04-13 LAB — LIPID PANEL
Cholesterol: 201 mg/dL — ABNORMAL HIGH (ref 0–200)
HDL: 51.7 mg/dL (ref >39.00)
LDL Cholesterol: 126 mg/dL — ABNORMAL HIGH (ref 0–99)
Total CHOL/HDL Ratio: 4
Triglycerides: 116 mg/dL (ref 0.0–149.0)
VLDL: 23.2 mg/dL (ref 0.0–40.0)

## 2013-04-13 LAB — HEMOGLOBIN A1C: Hgb A1c MFr Bld: 5.9 % (ref 4.6–6.5)

## 2013-04-13 LAB — TSH: TSH: 1.17 u[IU]/mL (ref 0.35–5.50)

## 2013-04-13 MED ORDER — TRIAMCINOLONE ACETONIDE 0.5 % EX CREA: 1.0000 "application " | TOPICAL_CREAM | Freq: Three times a day (TID) | CUTANEOUS | Status: DC

## 2013-04-13 MED ORDER — EFINACONAZOLE 10 % EX SOLN: 1.0000 | Freq: Every day | CUTANEOUS | Status: DC

## 2013-04-13 NOTE — Progress Notes (Signed)
Subjective:    Patient ID: Sandy MediateJanice Neiswonger, female    DOB: 10/27/1955, 58 y.o.   MRN: [id number]  Rash This is a recurrent problem. The current episode started more than 1 month ago. The problem is unchanged. The affected locations include the right lower leg and left arm. The rash is characterized by itchiness and dryness. She was exposed to nothing. Associated symptoms include nail changes (left great toenail has an area this thick and dark). Pertinent negatives include no anorexia, congestion, cough, diarrhea, eye pain, facial edema, fatigue, fever, joint pain, rhinorrhea, shortness of breath, sore throat or vomiting. Past treatments include nothing. The treatment provided no relief. Her past medical history is significant for allergies and eczema. There is no history of asthma or varicella.      Review of Systems  Constitutional: Negative.  Negative for fever, chills, diaphoresis, appetite change and fatigue.  HENT: Negative.  Negative for congestion, rhinorrhea, sinus pressure and sore throat.   Eyes: Negative.  Negative for pain.  Respiratory: Negative.  Negative for cough, choking, chest tightness, shortness of breath and stridor.   Cardiovascular: Negative.  Negative for chest pain, palpitations and leg swelling.  Gastrointestinal: Negative.  Negative for nausea, vomiting, abdominal pain, diarrhea, constipation, blood in stool and anorexia.  Endocrine: Negative.  Negative for polydipsia, polyphagia and polyuria.  Genitourinary: Negative.   Musculoskeletal: Negative.  Negative for arthralgias, back pain, gait problem, joint pain, joint swelling, myalgias, neck pain and neck stiffness.  Skin: Positive for nail changes (left great toenail has an area this thick and dark) and rash.  Allergic/Immunologic: Negative.   Neurological: Negative.  Negative for dizziness, tremors, speech difficulty, weakness and light-headedness.  Hematological: Negative.  Negative for adenopathy. Does not  bruise/bleed easily.  Psychiatric/Behavioral: Negative.        Objective:   Physical Exam  Vitals reviewed. Constitutional: She is oriented to person, place, and time. She appears well-developed and well-nourished. No distress.  HENT:  Head: Normocephalic and atraumatic.  Mouth/Throat: Oropharynx is clear and moist. No oropharyngeal exudate.  Eyes: Conjunctivae are normal. Right eye exhibits no discharge. Left eye exhibits no discharge. No scleral icterus.  Neck: Normal range of motion. Neck supple. No JVD present. No tracheal deviation present. No thyromegaly present.  Cardiovascular: Normal rate, regular rhythm, normal heart sounds and intact distal pulses.  Exam reveals no gallop and no friction rub.   No murmur heard. Pulmonary/Chest: Effort normal and breath sounds normal. No stridor. No respiratory distress. She has no wheezes. She has no rales. She exhibits no tenderness.  Abdominal: Soft. Bowel sounds are normal. She exhibits no distension and no mass. There is no tenderness. There is no rebound and no guarding.  Musculoskeletal: Normal range of motion. She exhibits no edema and no tenderness.  Lymphadenopathy:    She has no cervical adenopathy.  Neurological: She is oriented to person, place, and time.  Skin: Skin is warm, dry and intact. Rash noted. No purpura noted. Rash is macular. Rash is not papular, not maculopapular, not nodular, not pustular, not vesicular and not urticarial. She is not diaphoretic. No cyanosis. No pallor. Nails show no clubbing.     Left great toenail has been traumatized with an excision longitudinally down the center, surrounding this there is an area of thickening of the nail with hyperpigmentation. There is no exudate, warmth, tenderness.  Psychiatric: She has a normal mood and affect. Her behavior is normal. Judgment and thought content normal.     Lab Results  Component Value Date   WBC 9.5 05/04/2012   HGB 12.3 05/04/2012   HCT 35.6* 05/04/2012    PLT 266.0 05/04/2012   GLUCOSE 94 12/28/2012   CHOL 170 05/04/2012   TRIG 77.0 05/04/2012   HDL 53.60 05/04/2012   LDLCALC 101* 05/04/2012   ALT 31 05/04/2012   AST 22 05/04/2012   NA 140 12/28/2012   K 3.8 12/28/2012   CL 104 12/28/2012   CREATININE 0.7 12/28/2012   BUN 12 12/28/2012   CO2 29 12/28/2012   TSH 1.72 05/04/2012   HGBA1C 5.9 12/28/2012       Assessment & Plan:

## 2013-04-13 NOTE — Assessment & Plan Note (Signed)
Her BP is well controlled Today I will check her lytes and renal fucntion 

## 2013-04-13 NOTE — Patient Instructions (Signed)
Eczema Eczema, also called atopic dermatitis, is a skin disorder that causes inflammation of the skin. It causes a red rash and dry, scaly skin. The skin becomes very itchy. Eczema is generally worse during the cooler winter months and often improves with the warmth of summer. Eczema usually starts showing signs in infancy. Some children outgrow eczema, but it may last through adulthood.  CAUSES  The exact cause of eczema is not known, but it appears to run in families. People with eczema often have a family history of eczema, allergies, asthma, or hay fever. Eczema is not contagious. Flare-ups of the condition may be caused by:   Contact with something you are sensitive or allergic to.   Stress. SIGNS AND SYMPTOMS  Dry, scaly skin.   Red, itchy rash.   Itchiness. This may occur before the skin rash and may be very intense.  DIAGNOSIS  The diagnosis of eczema is usually made based on symptoms and medical history. TREATMENT  Eczema cannot be cured, but symptoms usually can be controlled with treatment and other strategies. A treatment plan might include:  Controlling the itching and scratching.   Use over-the-counter antihistamines as directed for itching. This is especially useful at night when the itching tends to be worse.   Use over-the-counter steroid creams as directed for itching.   Avoid scratching. Scratching makes the rash and itching worse. It may also result in a skin infection (impetigo) due to a break in the skin caused by scratching.   Keeping the skin well moisturized with creams every day. This will seal in moisture and help prevent dryness. Lotions that contain alcohol and water should be avoided because they can dry the skin.   Limiting exposure to things that you are sensitive or allergic to (allergens).   Recognizing situations that cause stress.   Developing a plan to manage stress.  HOME CARE INSTRUCTIONS   Only take over-the-counter or  prescription medicines as directed by your health care provider.   Do not use anything on the skin without checking with your health care provider.   Keep baths or showers short (5 minutes) in warm (not hot) water. Use mild cleansers for bathing. These should be unscented. You may add nonperfumed bath oil to the bath water. It is best to avoid soap and bubble bath.   Immediately after a bath or shower, when the skin is still damp, apply a moisturizing ointment to the entire body. This ointment should be a petroleum ointment. This will seal in moisture and help prevent dryness. The thicker the ointment, the better. These should be unscented.   Keep fingernails cut short. Children with eczema may need to wear soft gloves or mittens at night after applying an ointment.   Dress in clothes made of cotton or cotton blends. Dress lightly, because heat increases itching.   A child with eczema should stay away from anyone with fever blisters or cold sores. The virus that causes fever blisters (herpes simplex) can cause a serious skin infection in children with eczema. SEEK MEDICAL CARE IF:   Your itching interferes with sleep.   Your rash gets worse or is not better within 1 week after starting treatment.   You see pus or soft yellow scabs in the rash area.   You have a fever.   You have a rash flare-up after contact with someone who has fever blisters.  Document Released: 12/21/1999 Document Revised: 10/13/2012 Document Reviewed: 07/26/2012 Exit[employer] Patient Information 2014 [employer].  

## 2013-04-13 NOTE — Assessment & Plan Note (Signed)
I will treat with jublia

## 2013-04-13 NOTE — Assessment & Plan Note (Signed)
I will check her A1C to see if she has developed DM2 

## 2013-04-13 NOTE — Progress Notes (Signed)
Pre visit review using our clinic review tool, if applicable. No additional management support is needed unless otherwise documented below in the visit note. 

## 2013-04-13 NOTE — Assessment & Plan Note (Signed)
Will treat with TAC cream 

## 2013-04-27 ENCOUNTER — Ambulatory Visit: Payer: PRIVATE HEALTH INSURANCE | Admitting: Family Medicine

## 2013-05-02 ENCOUNTER — Ambulatory Visit: Payer: PRIVATE HEALTH INSURANCE | Admitting: Internal Medicine

## 2013-05-24 ENCOUNTER — Other Ambulatory Visit: Payer: Self-pay | Admitting: Internal Medicine

## 2013-05-31 ENCOUNTER — Other Ambulatory Visit: Payer: Self-pay | Admitting: Internal Medicine

## 2013-06-28 ENCOUNTER — Other Ambulatory Visit: Payer: Self-pay | Admitting: Internal Medicine

## 2013-07-18 ENCOUNTER — Encounter: Payer: Self-pay | Admitting: Internal Medicine

## 2013-07-18 ENCOUNTER — Other Ambulatory Visit (INDEPENDENT_AMBULATORY_CARE_PROVIDER_SITE_OTHER): Payer: PRIVATE HEALTH INSURANCE

## 2013-07-18 ENCOUNTER — Ambulatory Visit (INDEPENDENT_AMBULATORY_CARE_PROVIDER_SITE_OTHER): Payer: PRIVATE HEALTH INSURANCE | Admitting: Internal Medicine

## 2013-07-18 VITALS — BP 130/80 | HR 92 | Temp 98.2°F | Resp 16 | Ht 63.0 in | Wt 278.0 lb

## 2013-07-18 DIAGNOSIS — E876 Hypokalemia: Secondary | ICD-10-CM

## 2013-07-18 DIAGNOSIS — M7631 Iliotibial band syndrome, right leg: Secondary | ICD-10-CM

## 2013-07-18 DIAGNOSIS — R609 Edema, unspecified: Secondary | ICD-10-CM

## 2013-07-18 DIAGNOSIS — Z0000 Encounter for general adult medical examination without abnormal findings: Secondary | ICD-10-CM | POA: Insufficient documentation

## 2013-07-18 DIAGNOSIS — K219 Gastro-esophageal reflux disease without esophagitis: Secondary | ICD-10-CM

## 2013-07-18 DIAGNOSIS — S83207S Unspecified tear of unspecified meniscus, current injury, left knee, sequela: Secondary | ICD-10-CM

## 2013-07-18 DIAGNOSIS — I10 Essential (primary) hypertension: Secondary | ICD-10-CM

## 2013-07-18 DIAGNOSIS — F329 Major depressive disorder, single episode, unspecified: Secondary | ICD-10-CM

## 2013-07-18 DIAGNOSIS — F3289 Other specified depressive episodes: Secondary | ICD-10-CM

## 2013-07-18 LAB — BASIC METABOLIC PANEL
BUN: 13 mg/dL (ref 6–23)
CO2: 26 mEq/L (ref 19–32)
Calcium: 9 mg/dL (ref 8.4–10.5)
Chloride: 106 mEq/L (ref 96–112)
Creatinine, Ser: 0.7 mg/dL (ref 0.4–1.2)
GFR: 105.36 mL/min (ref >60.00)
Glucose, Bld: 95 mg/dL (ref 70–99)
Potassium: 3.7 mEq/L (ref 3.5–5.1)
Sodium: 140 mEq/L (ref 135–145)

## 2013-07-18 MED ORDER — LORCASERIN HCL 10 MG PO TABS: 1.0000 | ORAL_TABLET | Freq: Two times a day (BID) | ORAL | Status: DC

## 2013-07-18 NOTE — Assessment & Plan Note (Signed)
I will recheck her K+ level today 

## 2013-07-18 NOTE — Assessment & Plan Note (Signed)
This has worsened slightly due to weight gain and nsaid intake She will work on a weight loss program

## 2013-07-18 NOTE — Patient Instructions (Signed)
Obesity Obesity is defined as having too much total body fat and a body mass index (BMI) of 30 or more. BMI is an estimate of body fat and is calculated from your height and weight. Obesity happens when you consume more calories than you can burn by exercising or performing daily physical tasks. Prolonged obesity can cause major illnesses or emergencies, such as:   A stroke.  Heart disease.  Diabetes.  Cancer.  Arthritis.  High blood pressure (hypertension).  High cholesterol.  Sleep apnea.  Erectile dysfunction.  Infertility problems. CAUSES   Regularly eating unhealthy foods.  Physical inactivity.  Certain disorders, such as an underactive thyroid (hypothyroidism), Cushing's syndrome, and polycystic ovarian syndrome.  Certain medicines, such as steroids, some depression medicines, and antipsychotics.  Genetics.  Lack of sleep. DIAGNOSIS  A caregiver can diagnose obesity after calculating your BMI. Obesity will be diagnosed if your BMI is 30 or higher.  There are other methods of measuring obesity levels. Some other methods include measuring your skin fold thickness, your waist circumference, and comparing your hip circumference to your waist circumference. TREATMENT  A healthy treatment program includes some or all of the following:  Long-term dietary changes.  Exercise and physical activity.  Behavioral and lifestyle changes.  Medicine only under the supervision of your caregiver. Medicines may help, but only if they are used with diet and exercise programs. An unhealthy treatment program includes:  Fasting.  Fad diets.  Supplements and drugs. These choices do not succeed in long-term weight control.  HOME CARE INSTRUCTIONS   Exercise and perform physical activity as directed by your caregiver. To increase physical activity, try the following:  Use stairs instead of elevators.  Park farther away from store entrances.  Garden, bike, or walk instead of  watching television or using the computer.  Eat healthy, low-calorie foods and drinks on a regular basis. Eat more fruits and vegetables. Use low-calorie cookbooks or take healthy cooking classes.  Limit fast food, sweets, and processed snack foods.  Eat smaller portions.  Keep a daily journal of everything you eat. There are many free websites to help you with this. It may be helpful to measure your foods so you can determine if you are eating the correct portion sizes.  Avoid drinking alcohol. Drink more water and drinks without calories.  Take vitamins and supplements only as recommended by your caregiver.  Weight-loss support groups, Registered Dieticians, counselors, and stress reduction education can also be very helpful. SEEK IMMEDIATE MEDICAL CARE IF:  You have chest pain or tightness.  You have trouble breathing or feel short of breath.  You have weakness or leg numbness.  You feel confused or have trouble talking.  You have sudden changes in your vision. MAKE SURE YOU:  Understand these instructions.  Will watch your condition.  Will get help right away if you are not doing well or get worse. Document Released: 01/31/2004 Document Revised: 06/24/2011 Document Reviewed: 01/29/2011 Exit[employer] Patient Information 2015 [employer]. This information is not intended to replace advice given to you by your health care provider. Make sure you discuss any questions you have with your health care provider.  

## 2013-07-18 NOTE — Progress Notes (Signed)
Pre visit review using our clinic review tool, if applicable. No additional management support is needed unless otherwise documented below in the visit note. 

## 2013-07-18 NOTE — Progress Notes (Signed)
Subjective:    Patient ID: Sandy MediateJanice Reyes, female    DOB: 12/27/1955, 58 y.o.   MRN: [id number]  Hypertension This is a chronic problem. The current episode started more than 1 year ago. The problem has been gradually improving since onset. The problem is controlled. Associated symptoms include peripheral edema. Pertinent negatives include no anxiety, blurred vision, chest pain, headaches, malaise/fatigue, neck pain, orthopnea, palpitations, PND, shortness of breath or sweats. Agents associated with hypertension include NSAIDs. Risk factors for coronary artery disease include obesity. Past treatments include diuretics. The current treatment provides moderate improvement. Compliance problems include exercise and diet.       Review of Systems  Constitutional: Positive for unexpected weight change (WT GAIN). Negative for fever, chills, malaise/fatigue, diaphoresis, activity change, appetite change and fatigue.  HENT: Negative.   Eyes: Negative.  Negative for blurred vision.  Respiratory: Positive for apnea. Negative for cough, choking, chest tightness, shortness of breath, wheezing and stridor.   Cardiovascular: Negative.  Negative for chest pain, palpitations, orthopnea, leg swelling and PND.  Gastrointestinal: Negative.  Negative for nausea, vomiting, abdominal pain, diarrhea, constipation and blood in stool.  Endocrine: Negative.   Genitourinary: Negative.   Musculoskeletal: Positive for arthralgias. Negative for back pain, gait problem, joint swelling, myalgias, neck pain and neck stiffness.  Skin: Negative.  Negative for rash.  Allergic/Immunologic: Negative.   Neurological: Negative.  Negative for dizziness, tremors, speech difficulty, weakness, light-headedness, numbness and headaches.  Hematological: Negative.  Negative for adenopathy. Does not bruise/bleed easily.  Psychiatric/Behavioral: Negative.        Objective:   Physical Exam  Vitals reviewed. Constitutional: She is  oriented to person, place, and time. She appears well-developed and well-nourished. No distress.  HENT:  Head: Normocephalic and atraumatic.  Mouth/Throat: Oropharynx is clear and moist. No oropharyngeal exudate.  Eyes: Conjunctivae are normal. Right eye exhibits no discharge. Left eye exhibits no discharge. No scleral icterus.  Neck: Normal range of motion. Neck supple. No JVD present. No tracheal deviation present. No thyromegaly present.  Cardiovascular: Normal rate, regular rhythm, normal heart sounds and intact distal pulses.  Exam reveals no gallop and no friction rub.   No murmur heard. Pulmonary/Chest: Effort normal and breath sounds normal. No stridor. No respiratory distress. She has no wheezes. She has no rales. She exhibits no tenderness.  Abdominal: Soft. Bowel sounds are normal. She exhibits no distension and no mass. There is no tenderness. There is no rebound and no guarding.  Musculoskeletal: Normal range of motion. She exhibits edema (1+ pitting edema in BLE). She exhibits no tenderness.  Lymphadenopathy:    She has no cervical adenopathy.  Neurological: She is oriented to person, place, and time.  Skin: Skin is warm. No rash noted. She is not diaphoretic. No erythema. No pallor.  Psychiatric: She has a normal mood and affect. Her behavior is normal. Judgment and thought content normal.     Lab Results  Component Value Date   WBC 8.9 04/13/2013   HGB 12.3 04/13/2013   HCT 37.2 04/13/2013   PLT 323.0 04/13/2013   GLUCOSE 97 04/13/2013   CHOL 201* 04/13/2013   TRIG 116.0 04/13/2013   HDL 51.70 04/13/2013   LDLCALC 126* 04/13/2013   ALT 31 05/04/2012   AST 22 05/04/2012   NA 140 04/13/2013   K 3.6 04/13/2013   CL 101 04/13/2013   CREATININE 0.7 04/13/2013   BUN 12 04/13/2013   CO2 31 04/13/2013   TSH 1.17 04/13/2013   HGBA1C 5.9 04/13/2013  Assessment & Plan:

## 2013-07-18 NOTE — Assessment & Plan Note (Signed)
Her BP is well controlled I will monitor her lytes and renal function today 

## 2013-07-18 NOTE — Assessment & Plan Note (Signed)
She will try belviq to help with weight loss

## 2013-07-19 ENCOUNTER — Encounter: Payer: Self-pay | Admitting: Internal Medicine

## 2013-07-19 LAB — HEPATITIS C ANTIBODY: HCV Ab: NEGATIVE

## 2013-07-22 ENCOUNTER — Encounter: Payer: Self-pay | Admitting: Family Medicine

## 2013-07-22 ENCOUNTER — Other Ambulatory Visit (INDEPENDENT_AMBULATORY_CARE_PROVIDER_SITE_OTHER): Payer: PRIVATE HEALTH INSURANCE

## 2013-07-22 ENCOUNTER — Ambulatory Visit (INDEPENDENT_AMBULATORY_CARE_PROVIDER_SITE_OTHER): Payer: PRIVATE HEALTH INSURANCE | Admitting: Family Medicine

## 2013-07-22 VITALS — BP 120/80 | HR 78 | Temp 98.6°F | Wt 276.0 lb

## 2013-07-22 DIAGNOSIS — M25569 Pain in unspecified knee: Secondary | ICD-10-CM

## 2013-07-22 DIAGNOSIS — M25561 Pain in right knee: Secondary | ICD-10-CM

## 2013-07-22 DIAGNOSIS — IMO0002 Reserved for concepts with insufficient information to code with codable children: Secondary | ICD-10-CM

## 2013-07-22 DIAGNOSIS — S83206S Unspecified tear of unspecified meniscus, current injury, right knee, sequela: Secondary | ICD-10-CM

## 2013-07-22 NOTE — Progress Notes (Signed)
CC: Right knee pain follow up  HPI: Patient is a very pleasant 58 year old female coming in with right knee pain. Patient right knee pain seems to be starting to get on the medial aspect. Patient is a past medical history had an acute meniscal tear previously. Patient did respond very well to conservative therapy including a corticosteroid injection. Patient states unfortunately this feels fairly similar. More on the anterior aspect of the knee and in the posterior aspect of the knee. States that it is catching her when she goes from a sitting to standing position. Denies any swelling but states that there was some changes in the appearance of the knee. Patient is still able to angulate but does have pain if she tries to do some twisting motions. No radiation down the leg or any numbness.  Past medical, surgical, family and social history reviewed. Medications reviewed all in the electronic medical record.   Review of Systems: No headache, visual changes, nausea, vomiting, diarrhea, constipation, dizziness, abdominal pain, skin rash, fevers, chills, night sweats, weight loss, swollen lymph nodes, body aches, joint swelling, muscle aches, chest pain, shortness of breath, mood changes.   Objective:    Blood pressure 120/80, pulse 78, temperature 98.6 F (37 C), temperature source Oral, weight 276 lb (125.193 kg), SpO2 91.00%.   General: No apparent distress alert and oriented x3 mood and affect normal, dressed appropriately. obese HEENT: Pupils equal, extraocular movements intact Respiratory: Patient's speak in full sentences and does not appear short of breath Cardiovascular: No lower extremity edema, non tender, no erythema Skin: Warm dry intact with no signs of infection or rash on extremities or on axial skeleton. Abdomen: Soft nontender Neuro: Cranial nerves II through XII are intact, neurovascularly intact in all extremities with 2+ DTRs and 2+ pulses. Lymph: No lymphadenopathy of posterior  or anterior cervical chain or axillae bilaterally.  Gait normal with good balance and coordination.  MSK: Non tender with full range of motion and good stability and symmetric strength and tone of shoulders, elbows, wrist, hip,  and ankles bilaterally.  Knee:  right Normal to inspection with no erythema or effusion or obvious bony abnormalities. Palpation normal with no warmth,, patellar tenderness, or condyle tenderness. Patient does have joint line tenderness over the medial joint line. ROM full in flexion and extension and lower leg rotation. Ligaments with solid consistent endpoints including ACL, PCL, LCL, MCL.  Positive Mcmurray's, Apley's, and Thessalonian tests. Non painful patellar compression. Patellar glide with moderate crepitus. Patellar and quadriceps tendons unremarkable. Hamstring and quadriceps strength is normal.  Contralateral knee unremarkable  MSK US performed of: Right knee This study was ordered, performed, and interpreted by Terrilee FilesZach Smith D.O.  Knee: All structures visualized. Anteromedial meniscus does have a degenerative tear with some mild hypoechoic changes. No displacement noted., anterolateral, posteromedial, and posterolateral menisci unremarkable without tearing, fraying, effusion, or displacement. Patellar Tendon unremarkable on long and transverse views without effusion. No abnormality of prepatellar bursa. LCL and MCL unremarkable on long and transverse views. No abnormality of origin of medial or lateral head of the gastrocnemius.  IMPRESSION:  Acute on chronic medial meniscal tear  Procedure: Real-time Ultrasound Guided Injection of right knee Device: GE Logiq E  Ultrasound guided injection is preferred based studies that show increased duration, increased effect, greater accuracy, decreased procedural pain, increased response rate, and decreased cost with ultrasound guided versus blind injection.  Verbal informed consent obtained.  Time-out  conducted.  Noted no overlying erythema, induration, or other signs of local  infection.  Skin prepped in a sterile fashion.  Local anesthesia: Topical Ethyl chloride.  With sterile technique and under real time ultrasound guidance: With a 22-gauge 2 inch needle patient was injected with 4 cc of 0.5% Marcaine and 1 cc of Kenalog 40 mg/dL. This was from a superior lateral approach.  Completed without difficulty  Pain immediately resolved suggesting accurate placement of the medication.  Advised to call if fevers/chills, erythema, induration, drainage, or persistent bleeding.  Images permanently stored and available for review in the ultrasound unit.  Impression: Technically successful ultrasound guided injection.  Body mass index is 48.9 kg/(m^2).   Impression and Recommendations:     This case required medical decision making of moderate complexity.

## 2013-07-22 NOTE — Assessment & Plan Note (Signed)
Patient was given an ejection day with near resolution of pain immediately. Patient is encouraged to start wearing her brace that she's had previously as well starting the topical anti-inflammatories. Patient will also take meloxicam daily for the next 10 days from a previous prescription. We discussed icing protocol and went over the home exercises again. Patient will come back again in 3-4 weeks for further evaluation.  Spent greater than 25 minutes with patient face-to-face and had greater than 50% of counseling including as described above in assessment and plan.

## 2013-07-22 NOTE — Patient Instructions (Signed)
Good to see you Start the brace again and wear with a lot of activity.  Ice 20 minutes 2 times a day Exercises 3 times a week.  Meloxicam Daily for 10 days Come back in 3 weeks.

## 2013-08-12 ENCOUNTER — Ambulatory Visit: Payer: PRIVATE HEALTH INSURANCE | Admitting: Family Medicine

## 2013-09-16 ENCOUNTER — Other Ambulatory Visit: Payer: Self-pay | Admitting: Internal Medicine

## 2013-10-18 ENCOUNTER — Ambulatory Visit (INDEPENDENT_AMBULATORY_CARE_PROVIDER_SITE_OTHER): Payer: PRIVATE HEALTH INSURANCE | Admitting: Internal Medicine

## 2013-10-18 ENCOUNTER — Encounter: Payer: Self-pay | Admitting: Internal Medicine

## 2013-10-18 VITALS — BP 126/78 | HR 87 | Temp 98.7°F | Resp 16 | Ht 63.0 in | Wt 278.0 lb

## 2013-10-18 DIAGNOSIS — I10 Essential (primary) hypertension: Secondary | ICD-10-CM

## 2013-10-18 MED ORDER — PHENTERMINE HCL 37.5 MG PO CAPS: 37.5000 mg | ORAL_CAPSULE | ORAL | Status: DC

## 2013-10-18 NOTE — Assessment & Plan Note (Signed)
Her BP is well controlled 

## 2013-10-18 NOTE — Assessment & Plan Note (Signed)
She never took belviq due to her concern about side effects Will try phentermine She will improve her lifestyle modifications

## 2013-10-18 NOTE — Patient Instructions (Signed)

## 2013-10-18 NOTE — Progress Notes (Signed)
Pre visit review using our clinic review tool, if applicable. No additional management support is needed unless otherwise documented below in the visit note. 

## 2013-10-18 NOTE — Progress Notes (Signed)
   Subjective:    Patient ID: [name], female    DOB: 01/11/1955, 58 y.o.   MRN: [id number]  Hypertension This is a chronic problem. The current episode started more than 1 year ago. The problem has been gradually improving since onset. The problem is controlled. Pertinent negatives include no anxiety, blurred vision, chest pain, headaches, malaise/fatigue, neck pain, orthopnea, palpitations, peripheral edema, PND, shortness of breath or sweats. Agents associated with hypertension include NSAIDs. Risk factors for coronary artery disease include obesity. Past treatments include diuretics. The current treatment provides moderate improvement. Compliance problems include diet and exercise.       Review of Systems  Constitutional: Negative.  Negative for fever, chills, malaise/fatigue, diaphoresis, appetite change and fatigue.  HENT: Negative.   Eyes: Negative.  Negative for blurred vision.  Respiratory: Negative.  Negative for cough, choking, chest tightness, shortness of breath, wheezing and stridor.   Cardiovascular: Negative.  Negative for chest pain, palpitations, orthopnea, leg swelling and PND.  Gastrointestinal: Negative.  Negative for nausea, vomiting, abdominal pain, diarrhea, constipation and blood in stool.  Endocrine: Negative.   Genitourinary: Negative.   Musculoskeletal: Positive for arthralgias. Negative for back pain, gait problem, joint swelling, myalgias and neck pain.  Skin: Negative.  Negative for rash.  Allergic/Immunologic: Negative.   Neurological: Negative.  Negative for headaches.  Hematological: Negative.  Negative for adenopathy. Does not bruise/bleed easily.  Psychiatric/Behavioral: Negative.        Objective:   Physical Exam  Vitals reviewed. Constitutional: She is oriented to person, place, and time. She appears well-developed and well-nourished. No distress.  HENT:  Head: Normocephalic and atraumatic.  Mouth/Throat: Oropharynx is clear and moist. No  oropharyngeal exudate.  Eyes: Conjunctivae are normal. Right eye exhibits no discharge. Left eye exhibits no discharge. No scleral icterus.  Neck: Normal range of motion. Neck supple. No JVD present. No tracheal deviation present. No thyromegaly present.  Cardiovascular: Normal rate, regular rhythm, normal heart sounds and intact distal pulses.  Exam reveals no gallop and no friction rub.   No murmur heard. Pulmonary/Chest: Effort normal and breath sounds normal. No stridor. No respiratory distress. She has no wheezes. She has no rales. She exhibits no tenderness.  Abdominal: Soft. Bowel sounds are normal. She exhibits no distension and no mass. There is no tenderness. There is no rebound and no guarding.  Musculoskeletal: Normal range of motion. She exhibits no edema and no tenderness.  Lymphadenopathy:    She has no cervical adenopathy.  Neurological: She is oriented to person, place, and time.  Skin: Skin is warm and dry. No rash noted. She is not diaphoretic. No erythema. No pallor.     Lab Results  Component Value Date   WBC 8.9 04/13/2013   HGB 12.3 04/13/2013   HCT 37.2 04/13/2013   PLT 323.0 04/13/2013   GLUCOSE 95 07/18/2013   CHOL 201* 04/13/2013   TRIG 116.0 04/13/2013   HDL 51.70 04/13/2013   LDLCALC 126* 04/13/2013   ALT 31 05/04/2012   AST 22 05/04/2012   NA 140 07/18/2013   K 3.7 07/18/2013   CL 106 07/18/2013   CREATININE 0.7 07/18/2013   BUN 13 07/18/2013   CO2 26 07/18/2013   TSH 1.17 04/13/2013   HGBA1C 5.9 04/13/2013       Assessment & Plan:

## 2013-10-19 ENCOUNTER — Telehealth: Payer: Self-pay | Admitting: Internal Medicine

## 2013-10-19 NOTE — Telephone Encounter (Signed)
[facility] EMAILED  °

## 2013-10-20 ENCOUNTER — Other Ambulatory Visit: Payer: Self-pay | Admitting: Internal Medicine

## 2014-01-18 ENCOUNTER — Encounter: Payer: Self-pay | Admitting: Internal Medicine

## 2014-01-18 ENCOUNTER — Ambulatory Visit (INDEPENDENT_AMBULATORY_CARE_PROVIDER_SITE_OTHER): Payer: PRIVATE HEALTH INSURANCE | Admitting: Internal Medicine

## 2014-01-18 ENCOUNTER — Other Ambulatory Visit (INDEPENDENT_AMBULATORY_CARE_PROVIDER_SITE_OTHER): Payer: PRIVATE HEALTH INSURANCE

## 2014-01-18 VITALS — BP 116/78 | HR 97 | Temp 98.3°F | Resp 16 | Ht 63.0 in | Wt 265.0 lb

## 2014-01-18 DIAGNOSIS — E876 Hypokalemia: Secondary | ICD-10-CM

## 2014-01-18 DIAGNOSIS — R739 Hyperglycemia, unspecified: Secondary | ICD-10-CM

## 2014-01-18 DIAGNOSIS — N3281 Overactive bladder: Secondary | ICD-10-CM

## 2014-01-18 DIAGNOSIS — I10 Essential (primary) hypertension: Secondary | ICD-10-CM

## 2014-01-18 LAB — BASIC METABOLIC PANEL
BUN: 12 mg/dL (ref 6–23)
CO2: 27 mEq/L (ref 19–32)
Calcium: 9.4 mg/dL (ref 8.4–10.5)
Chloride: 103 mEq/L (ref 96–112)
Creatinine, Ser: 0.78 mg/dL (ref 0.40–1.20)
GFR: 97.44 mL/min (ref >60.00)
Glucose, Bld: 93 mg/dL (ref 70–99)
Potassium: 3.7 mEq/L (ref 3.5–5.1)
Sodium: 140 mEq/L (ref 135–145)

## 2014-01-18 LAB — URINALYSIS, ROUTINE W REFLEX MICROSCOPIC
Bilirubin Urine: NEGATIVE
Hgb urine dipstick: NEGATIVE
Ketones, ur: NEGATIVE
Leukocytes, UA: NEGATIVE
Nitrite: NEGATIVE
Specific Gravity, Urine: 1.015 (ref 1.000–1.030)
Total Protein, Urine: NEGATIVE
Urine Glucose: NEGATIVE
Urobilinogen, UA: 0.2 (ref 0.0–1.0)
pH: 7 (ref 5.0–8.0)

## 2014-01-18 LAB — TSH: TSH: 1.15 u[IU]/mL (ref 0.35–4.50)

## 2014-01-18 LAB — HEMOGLOBIN A1C: Hgb A1c MFr Bld: 6.1 % (ref 4.6–6.5)

## 2014-01-18 NOTE — Assessment & Plan Note (Signed)
I will check her A1C to see if she has developed DM2 

## 2014-01-18 NOTE — Assessment & Plan Note (Signed)
She wants to see a urologist about this

## 2014-01-18 NOTE — Assessment & Plan Note (Signed)
Her BP is well controlled Will monitor her lytes and renal function today 

## 2014-01-18 NOTE — Progress Notes (Signed)
   Subjective:    Patient ID: Sandy MediateJanice Burley, female    DOB: 02/16/1955, 59 y.o.   MRN: [id number]  Hypertension This is a chronic problem. The current episode started more than 1 year ago. The problem has been gradually improving since onset. The problem is controlled. Pertinent negatives include no anxiety, blurred vision, chest pain, headaches, malaise/fatigue, neck pain, orthopnea, palpitations, peripheral edema, PND, shortness of breath or sweats. Agents associated with hypertension include anorectics. Past treatments include diuretics. The current treatment provides significant improvement. Compliance problems include diet and exercise.       Review of Systems  Constitutional: Negative.  Negative for fever, chills, malaise/fatigue, diaphoresis, appetite change and fatigue.  HENT: Negative.   Eyes: Negative.  Negative for blurred vision.  Respiratory: Negative.  Negative for cough, choking, chest tightness, shortness of breath and stridor.   Cardiovascular: Negative.  Negative for chest pain, palpitations, orthopnea, leg swelling and PND.  Gastrointestinal: Negative.  Negative for nausea, vomiting, abdominal pain, diarrhea, constipation and blood in stool.  Endocrine: Negative.   Genitourinary: Positive for urgency, frequency and enuresis. Negative for dysuria, hematuria, flank pain, decreased urine volume, difficulty urinating and dyspareunia.       Incontinence  Musculoskeletal: Negative.  Negative for myalgias, back pain, joint swelling, arthralgias and neck pain.  Skin: Negative.  Negative for rash.  Allergic/Immunologic: Negative.   Neurological: Negative.  Negative for headaches.  Hematological: Negative.   Psychiatric/Behavioral: Negative.        Objective:   Physical Exam  Constitutional: She is oriented to person, place, and time. She appears well-developed and well-nourished. No distress.  HENT:  Head: Normocephalic and atraumatic.  Mouth/Throat: Oropharynx is clear and  moist. No oropharyngeal exudate.  Eyes: Conjunctivae are normal. Right eye exhibits no discharge. Left eye exhibits no discharge. No scleral icterus.  Neck: Normal range of motion. Neck supple. No JVD present. No tracheal deviation present. No thyromegaly present.  Cardiovascular: Normal rate, regular rhythm and intact distal pulses.  Exam reveals no gallop and no friction rub.   No murmur heard. Pulmonary/Chest: Effort normal and breath sounds normal. No stridor. No respiratory distress. She has no wheezes. She has no rales. She exhibits no tenderness.  Abdominal: Soft. Bowel sounds are normal. She exhibits no distension and no mass. There is no tenderness. There is no rebound and no guarding.  Musculoskeletal: Normal range of motion. She exhibits no edema or tenderness.  Lymphadenopathy:    She has no cervical adenopathy.  Neurological: She is oriented to person, place, and time.  Skin: Skin is warm and dry. No rash noted. She is not diaphoretic. No erythema. No pallor.  Psychiatric: She has a normal mood and affect. Her behavior is normal. Judgment and thought content normal.  Vitals reviewed.    Lab Results  Component Value Date   WBC 8.9 04/13/2013   HGB 12.3 04/13/2013   HCT 37.2 04/13/2013   PLT 323.0 04/13/2013   GLUCOSE 95 07/18/2013   CHOL 201* 04/13/2013   TRIG 116.0 04/13/2013   HDL 51.70 04/13/2013   LDLCALC 126* 04/13/2013   ALT 31 05/04/2012   AST 22 05/04/2012   NA 140 07/18/2013   K 3.7 07/18/2013   CL 106 07/18/2013   CREATININE 0.7 07/18/2013   BUN 13 07/18/2013   CO2 26 07/18/2013   TSH 1.17 04/13/2013   HGBA1C 5.9 04/13/2013       Assessment & Plan:

## 2014-01-18 NOTE — Progress Notes (Signed)
Pre visit review using our clinic review tool, if applicable. No additional management support is needed unless otherwise documented below in the visit note. 

## 2014-01-18 NOTE — Assessment & Plan Note (Signed)
I will recheck her K+ level today 

## 2014-01-18 NOTE — Patient Instructions (Signed)

## 2014-03-10 ENCOUNTER — Other Ambulatory Visit: Payer: Self-pay | Admitting: Internal Medicine

## 2014-04-22 ENCOUNTER — Ambulatory Visit (INDEPENDENT_AMBULATORY_CARE_PROVIDER_SITE_OTHER): Payer: PRIVATE HEALTH INSURANCE | Admitting: Family Medicine

## 2014-04-22 ENCOUNTER — Encounter: Payer: Self-pay | Admitting: Family Medicine

## 2014-04-22 VITALS — BP 112/74 | Temp 98.0°F | Wt 266.0 lb

## 2014-04-22 DIAGNOSIS — M542 Cervicalgia: Secondary | ICD-10-CM

## 2014-04-22 MED ORDER — CYCLOBENZAPRINE HCL 10 MG PO TABS: 10.0000 mg | ORAL_TABLET | Freq: Three times a day (TID) | ORAL | Status: DC | PRN

## 2014-04-22 NOTE — Progress Notes (Signed)
Pre visit review using our clinic review tool, if applicable. No additional management support is needed unless otherwise documented below in the visit note. 

## 2014-04-22 NOTE — Progress Notes (Signed)
   Subjective:    Patient ID: [name], female    DOB: 09/25/1955, 59 y.o.   MRN: [id number]  HPI Here for 7 weeks of stiffness and pain in the right posterior neck. No trauma hx, but she says this started after she saw her hair stylist. No HA, and she has no radiation into the arms or hands. She is using Ibuprofen 800 mg 2-3 times a day and moist heat.    Review of Systems  Constitutional: Negative.   Musculoskeletal: Positive for neck pain and neck stiffness.       Objective:   Physical Exam  Constitutional: She is oriented to person, place, and time. She appears well-developed and well-nourished. No distress.  Musculoskeletal:  She has some spasm and tenderness along the right posterior neck, ROM is limited   Neurological: She is alert and oriented to person, place, and time. No cranial nerve deficit.          Assessment & Plan:  She has spasms in the neck. We will add Flexeril 10 mg tid prn. I advised her to get a massage this weekend. Try gentle stretches. If not better by next week she may require some PT.

## 2014-04-24 ENCOUNTER — Telehealth: Payer: Self-pay | Admitting: Internal Medicine

## 2014-04-24 DIAGNOSIS — J302 Other seasonal allergic rhinitis: Secondary | ICD-10-CM

## 2014-04-24 MED ORDER — MONTELUKAST SODIUM 10 MG PO TABS: 10.0000 mg | ORAL_TABLET | Freq: Every day | ORAL | Status: DC

## 2014-04-24 NOTE — Telephone Encounter (Signed)
done

## 2014-04-24 NOTE — Telephone Encounter (Signed)
Pt called in and wants a refill on her montelukast (SINGULAIR) 10 MG tablet [[id number]]  She said that she was in office on Saturday and meant to ask dr about appt.  Can she get this refilled?

## 2014-05-03 ENCOUNTER — Other Ambulatory Visit: Payer: Self-pay

## 2014-05-03 MED ORDER — VITAMIN D (ERGOCALCIFEROL) 1.25 MG (50000 UNIT) PO CAPS: 50000.0000 [IU] | ORAL_CAPSULE | ORAL | Status: DC

## 2014-06-08 ENCOUNTER — Other Ambulatory Visit: Payer: Self-pay | Admitting: Internal Medicine

## 2014-08-29 ENCOUNTER — Encounter: Payer: Self-pay | Admitting: Gastroenterology

## 2014-08-31 ENCOUNTER — Other Ambulatory Visit: Payer: Self-pay

## 2014-08-31 MED ORDER — POTASSIUM CHLORIDE CRYS ER 20 MEQ PO TBCR: 20.0000 meq | EXTENDED_RELEASE_TABLET | Freq: Once | ORAL | Status: DC

## 2014-09-04 ENCOUNTER — Other Ambulatory Visit: Payer: Self-pay

## 2014-09-04 MED ORDER — POTASSIUM CHLORIDE CRYS ER 20 MEQ PO TBCR: 20.0000 meq | EXTENDED_RELEASE_TABLET | Freq: Once | ORAL | Status: DC

## 2014-09-15 ENCOUNTER — Telehealth: Payer: Self-pay | Admitting: *Deleted

## 2014-09-15 MED ORDER — POTASSIUM CHLORIDE CRYS ER 20 MEQ PO TBCR: 20.0000 meq | EXTENDED_RELEASE_TABLET | Freq: Once | ORAL | Status: DC

## 2014-09-15 MED ORDER — VITAMIN D (ERGOCALCIFEROL) 1.25 MG (50000 UNIT) PO CAPS: 50000.0000 [IU] | ORAL_CAPSULE | ORAL | Status: DC

## 2014-09-15 NOTE — Telephone Encounter (Signed)
Receive call pt is needing refills on her Klor Con & Vitamin D. CPX done in Jan sent refill electronically...Raechel Chute

## 2014-09-18 ENCOUNTER — Other Ambulatory Visit: Payer: Self-pay | Admitting: Internal Medicine

## 2014-12-04 ENCOUNTER — Other Ambulatory Visit: Payer: Self-pay | Admitting: Internal Medicine

## 2015-02-20 ENCOUNTER — Inpatient Hospital Stay (HOSPITAL_COMMUNITY)
Admission: EM | Admit: 2015-02-20 | Discharge: 2015-02-25 | DRG: 389 | Disposition: A | Discharge status: Home / Self Care | Payer: PRIVATE HEALTH INSURANCE | Attending: General Surgery | Admitting: General Surgery

## 2015-02-20 ENCOUNTER — Emergency Department (HOSPITAL_COMMUNITY): Payer: PRIVATE HEALTH INSURANCE

## 2015-02-20 ENCOUNTER — Encounter (HOSPITAL_COMMUNITY): Payer: Self-pay

## 2015-02-20 DIAGNOSIS — Z6841 Body Mass Index (BMI) 40.0 and over, adult: Secondary | ICD-10-CM | POA: Diagnosis not present

## 2015-02-20 DIAGNOSIS — F329 Major depressive disorder, single episode, unspecified: Secondary | ICD-10-CM | POA: Diagnosis present

## 2015-02-20 DIAGNOSIS — I10 Essential (primary) hypertension: Secondary | ICD-10-CM | POA: Diagnosis present

## 2015-02-20 DIAGNOSIS — K56609 Unspecified intestinal obstruction, unspecified as to partial versus complete obstruction: Secondary | ICD-10-CM | POA: Diagnosis present

## 2015-02-20 DIAGNOSIS — Z9884 Bariatric surgery status: Secondary | ICD-10-CM

## 2015-02-20 DIAGNOSIS — K5660 Unspecified intestinal obstruction: Principal | ICD-10-CM | POA: Diagnosis present

## 2015-02-20 DIAGNOSIS — Z9049 Acquired absence of other specified parts of digestive tract: Secondary | ICD-10-CM

## 2015-02-20 DIAGNOSIS — K219 Gastro-esophageal reflux disease without esophagitis: Secondary | ICD-10-CM | POA: Diagnosis present

## 2015-02-20 DIAGNOSIS — Z8249 Family history of ischemic heart disease and other diseases of the circulatory system: Secondary | ICD-10-CM | POA: Diagnosis not present

## 2015-02-20 DIAGNOSIS — Z833 Family history of diabetes mellitus: Secondary | ICD-10-CM | POA: Diagnosis not present

## 2015-02-20 DIAGNOSIS — F419 Anxiety disorder, unspecified: Secondary | ICD-10-CM | POA: Diagnosis present

## 2015-02-20 DIAGNOSIS — G4733 Obstructive sleep apnea (adult) (pediatric): Secondary | ICD-10-CM | POA: Diagnosis present

## 2015-02-20 DIAGNOSIS — E669 Obesity, unspecified: Secondary | ICD-10-CM | POA: Diagnosis present

## 2015-02-20 DIAGNOSIS — Z79899 Other long term (current) drug therapy: Secondary | ICD-10-CM

## 2015-02-20 DIAGNOSIS — K5669 Other intestinal obstruction: Secondary | ICD-10-CM | POA: Diagnosis present

## 2015-02-20 LAB — COMPREHENSIVE METABOLIC PANEL
ALT: 31 U/L (ref 14–54)
AST: 36 U/L (ref 15–41)
Albumin: 4.7 g/dL (ref 3.5–5.0)
Alkaline Phosphatase: 122 U/L (ref 38–126)
Anion gap: 17 — ABNORMAL HIGH (ref 5–15)
BUN: 15 mg/dL (ref 6–20)
CO2: 26 mmol/L (ref 22–32)
Calcium: 10.2 mg/dL (ref 8.9–10.3)
Chloride: 98 mmol/L — ABNORMAL LOW (ref 101–111)
Creatinine, Ser: 0.87 mg/dL (ref 0.44–1.00)
GFR calc Af Amer: >60 mL/min (ref >60)
GFR calc non Af Amer: >60 mL/min (ref >60)
Glucose, Bld: 162 mg/dL — ABNORMAL HIGH (ref 65–99)
Potassium: 3.4 mmol/L — ABNORMAL LOW (ref 3.5–5.1)
Sodium: 141 mmol/L (ref 135–145)
Total Bilirubin: 0.6 mg/dL (ref 0.3–1.2)
Total Protein: 8.9 g/dL — ABNORMAL HIGH (ref 6.5–8.1)

## 2015-02-20 LAB — CBC
HCT: 42.8 % (ref 36.0–46.0)
Hemoglobin: 13.7 g/dL (ref 12.0–15.0)
MCH: 29 pg (ref 26.0–34.0)
MCHC: 32 g/dL (ref 30.0–36.0)
MCV: 90.5 fL (ref 78.0–100.0)
Platelets: 366 10*3/uL (ref 150–400)
RBC: 4.73 MIL/uL (ref 3.87–5.11)
RDW: 14.9 % (ref 11.5–15.5)
WBC: 17.3 10*3/uL — ABNORMAL HIGH (ref 4.0–10.5)

## 2015-02-20 LAB — URINALYSIS, ROUTINE W REFLEX MICROSCOPIC
Bilirubin Urine: NEGATIVE
Glucose, UA: NEGATIVE mg/dL
Hgb urine dipstick: NEGATIVE
Ketones, ur: NEGATIVE mg/dL
Leukocytes, UA: NEGATIVE
Nitrite: NEGATIVE
Protein, ur: 100 mg/dL — AB
Specific Gravity, Urine: 1.031 — ABNORMAL HIGH (ref 1.005–1.030)
pH: 5.5 (ref 5.0–8.0)

## 2015-02-20 LAB — URINE MICROSCOPIC-ADD ON

## 2015-02-20 LAB — LIPASE, BLOOD: Lipase: 21 U/L (ref 11–51)

## 2015-02-20 MED ORDER — DIPHENHYDRAMINE HCL 50 MG/ML IJ SOLN: 25.0000 mg | Freq: Four times a day (QID) | INTRAMUSCULAR | Status: DC | PRN

## 2015-02-20 MED ORDER — ONDANSETRON 4 MG PO TBDP: 4.0000 mg | ORAL_TABLET | Freq: Four times a day (QID) | ORAL | Status: DC | PRN

## 2015-02-20 MED ORDER — HYDROMORPHONE HCL 1 MG/ML IJ SOLN
INTRAMUSCULAR | Status: AC
  Filled 2015-02-20: qty 1

## 2015-02-20 MED ORDER — HYDROMORPHONE HCL 1 MG/ML IJ SOLN
1.0000 mg | INTRAMUSCULAR | Status: AC | PRN
  Administered 2015-02-20 (×2): 1 mg via INTRAVENOUS
  Filled 2015-02-20 (×2): qty 1

## 2015-02-20 MED ORDER — IOHEXOL 300 MG/ML  SOLN
50.0000 mL | Freq: Once | INTRAMUSCULAR | Status: DC | PRN
  Administered 2015-02-20: 50 mL via ORAL
  Filled 2015-02-20: qty 50

## 2015-02-20 MED ORDER — ONDANSETRON HCL 4 MG/2ML IJ SOLN
4.0000 mg | Freq: Four times a day (QID) | INTRAMUSCULAR | Status: DC | PRN
  Administered 2015-02-20 – 2015-02-22 (×7): 4 mg via INTRAVENOUS
  Filled 2015-02-20 (×7): qty 2

## 2015-02-20 MED ORDER — HYDROMORPHONE HCL 1 MG/ML IJ SOLN
0.5000 mg | Freq: Once | INTRAMUSCULAR | Status: AC
  Administered 2015-02-20: 0.5 mg via INTRAVENOUS

## 2015-02-20 MED ORDER — ACETAMINOPHEN 650 MG RE SUPP: 650.0000 mg | Freq: Four times a day (QID) | RECTAL | Status: DC | PRN

## 2015-02-20 MED ORDER — HYDRALAZINE HCL 20 MG/ML IJ SOLN: 5.0000 mg | INTRAMUSCULAR | Status: DC | PRN

## 2015-02-20 MED ORDER — SODIUM CHLORIDE 0.9 % IV SOLN
Freq: Once | INTRAVENOUS | Status: AC
  Administered 2015-02-20: 13:00:00 via INTRAVENOUS

## 2015-02-20 MED ORDER — POTASSIUM CHLORIDE IN NACL 20-0.9 MEQ/L-% IV SOLN
INTRAVENOUS | Status: DC
  Administered 2015-02-20 – 2015-02-21 (×2): via INTRAVENOUS
  Administered 2015-02-21: 1000 mL via INTRAVENOUS
  Administered 2015-02-21: 100 mL/h via INTRAVENOUS
  Administered 2015-02-22 – 2015-02-23 (×3): via INTRAVENOUS
  Filled 2015-02-20 (×8): qty 1000

## 2015-02-20 MED ORDER — IOHEXOL 300 MG/ML  SOLN
100.0000 mL | Freq: Once | INTRAMUSCULAR | Status: AC | PRN
  Administered 2015-02-20: 100 mL via INTRAVENOUS

## 2015-02-20 MED ORDER — PROMETHAZINE HCL 25 MG PO TABS: 25.0000 mg | ORAL_TABLET | Freq: Four times a day (QID) | ORAL | Status: DC | PRN

## 2015-02-20 MED ORDER — ONDANSETRON HCL 4 MG/2ML IJ SOLN
4.0000 mg | Freq: Once | INTRAMUSCULAR | Status: AC
  Administered 2015-02-20: 4 mg via INTRAVENOUS
  Filled 2015-02-20: qty 2

## 2015-02-20 MED ORDER — HYDROMORPHONE HCL 1 MG/ML IJ SOLN
0.5000 mg | INTRAMUSCULAR | Status: DC | PRN
Start: 2015-02-20 — End: 2015-02-25
  Administered 2015-02-20: 1 mg via INTRAVENOUS
  Administered 2015-02-20: 0.5 mg via INTRAVENOUS
  Administered 2015-02-21 (×6): 1 mg via INTRAVENOUS
  Administered 2015-02-21: 0.5 mg via INTRAVENOUS
  Administered 2015-02-21 – 2015-02-22 (×7): 1 mg via INTRAVENOUS
  Filled 2015-02-20 (×16): qty 1

## 2015-02-20 MED ORDER — ACETAMINOPHEN 325 MG PO TABS
650.0000 mg | ORAL_TABLET | Freq: Four times a day (QID) | ORAL | Status: DC | PRN
  Administered 2015-02-22 – 2015-02-24 (×6): 650 mg via ORAL
  Filled 2015-02-20 (×6): qty 2

## 2015-02-20 MED ORDER — LIDOCAINE HCL 2 % EX GEL
1.0000 "application " | Freq: Once | CUTANEOUS | Status: AC
  Administered 2015-02-20: 1
  Filled 2015-02-20: qty 11

## 2015-02-20 MED ORDER — SODIUM CHLORIDE 0.9 % IV BOLUS (SEPSIS)
500.0000 mL | Freq: Once | INTRAVENOUS | Status: AC
Start: 2015-02-20 — End: 2015-02-20
  Administered 2015-02-20: 500 mL via INTRAVENOUS

## 2015-02-20 MED ORDER — DIPHENHYDRAMINE HCL 25 MG PO CAPS
25.0000 mg | ORAL_CAPSULE | Freq: Four times a day (QID) | ORAL | Status: DC | PRN
  Administered 2015-02-23: 25 mg via ORAL
  Filled 2015-02-20 (×2): qty 1

## 2015-02-20 MED ORDER — HEPARIN SODIUM (PORCINE) 5000 UNIT/ML IJ SOLN
5000.0000 [IU] | Freq: Three times a day (TID) | INTRAMUSCULAR | Status: DC
  Administered 2015-02-20 – 2015-02-25 (×14): 5000 [IU] via SUBCUTANEOUS
  Filled 2015-02-20 (×18): qty 1

## 2015-02-20 MED ORDER — ONDANSETRON 4 MG PO TBDP
4.0000 mg | ORAL_TABLET | Freq: Once | ORAL | Status: AC | PRN
Start: 2015-02-20 — End: 2015-02-20
  Administered 2015-02-20: 4 mg via ORAL
  Filled 2015-02-20: qty 1

## 2015-02-20 NOTE — H&P (Signed)
Reason for Consult: SBO Referring Physician: Dr. Alyson Locket CC: LUQ pain, nausea starting last PM PCP: Scarlette Calico, MD     [name] is an 60 y.o. female.  HPI: Pt with history of Cholecystectomy, followed at a later time with ERCP and severe pancreatitis. She has a hx of sleep apnea and required a tracheostomy at that time for respiratory failure during her severe pancreatitis. Now presents with LUQ abdominal pain and nausea last PM. She presents with 10/10 pain. She has a hx of exploratory laparotomy, lysis of adhesions, and lap band adjustment.  Work up in the ED shows she is afebrile, and VSS.K+ 3.4, lipase is 21, LFT's are normal, creatinine is 0.87. CT scan shows: There is radiopaque banding around the GE junction. There are multiple dilated small bowel loops with transition probably in the distal jejunum or proximal ileum. The distal ileum loops are normal in caliber. There is diverticulosis of colon without diverticulitis. There is also prior appendectomy and cholecystectomy findings. Consistent with SBO and transition in the distal jejunum or proximal ileum. We are ask to see.  Past Medical History  Diagnosis Date  Sleep apnea with CPAP   Hypertension   Obesity /s/p Lap band placement 2010 60 pound weight loss, but has regained 40 of that. Port on lap band does not work, and it is not filled.          Hx of acute pancreatitis after ERCP Tracheostomy for Acute respiratory failure after ERCP/pancreatitis   Hyperglycemia   GERD (gastroesophageal reflux disease)   Anxiety disorder   HTN (hypertension)   Chronic cough   Hypokalemia   Depression     Past Surgical History  Procedure Laterality Date  . Ercp    . Appendectomy    . Breast reduction surgery    . Cholecystectomy    . Abdominal hysterectomy    . Hammer toe surgery    . Tubal ligation    . Intestinal malrotation repair  1999     Family History  Problem Relation Age of Onset  . Diabetes Other   . Hypertension Other     Social History:  reports that she has never smoked. She has never used smokeless tobacco. She reports that she does not drink alcohol or use illicit drugs. Tobacco: None  DRUGS: None ETOH: None Works as a Museum/gallery conservator  Allergies:  Allergies  Allergen Reactions  . Fastin [Phentermine]     headache  . Lisinopril     REACTION: cough, facial swelling    Prior to Admission medications   Medication Sig Start Date End Date Taking? Authorizing Provider  aspirin 325 MG tablet Take 325 mg by mouth daily as needed for mild pain, moderate pain or headache.   Yes Historical Provider, MD  DULoxetine (CYMBALTA) 30 MG capsule TAKE 1 CAPSULE DAILY Patient taking differently: TAKE 30 MG BY MOUTH DAILY 09/18/14  Yes Janith Lima, MD  ibuprofen (ADVIL) 200 MG tablet Take 600 mg by mouth every 8 (eight) hours as needed for headache or mild pain.  04/21/13  Yes Janith Lima, MD  montelukast (SINGULAIR) 10 MG tablet Take 1 tablet (10 mg total) by mouth at bedtime. 04/24/14  Yes Janith Lima, MD  Multiple Vitamins-Minerals (MULTIVITAMIN,TX-MINERALS) tablet Take 1 tablet by mouth daily.    Yes Historical Provider, MD  potassium chloride SA (KLOR-CON M20) 20 MEQ tablet Take 1 tablet (20 mEq total) by mouth once. Patient taking differently: Take 20 mEq by mouth 3 (  three) times a week.  09/15/14  Yes Janith Lima, MD  Probiotic Product (PROBIOTIC DAILY PO) Take 1 tablet by mouth daily.   Yes Historical Provider, MD  triamterene-hydrochlorothiazide (DYAZIDE) 37.5-25 MG per capsule TAKE 1 CAPSULE DAILY 03/10/14  Yes Janith Lima, MD  Vitamin D, Ergocalciferol, (DRISDOL) 50000 UNITS CAPS capsule Take 1 capsule (50,000 Units total) by mouth once a week. 09/15/14  Yes Janith Lima, MD     Lab Results Last 48  Hours    Results for orders placed or performed during the hospital encounter of 02/20/15 (from the past 48 hour(s))  Lipase, blood Status: None   Collection Time: 02/20/15 8:56 AM  Result Value Ref Range   Lipase 21 11 - 51 U/L  Comprehensive metabolic panel Status: Abnormal   Collection Time: 02/20/15 8:56 AM  Result Value Ref Range   Sodium 141 135 - 145 mmol/L   Potassium 3.4 (L) 3.5 - 5.1 mmol/L   Chloride 98 (L) 101 - 111 mmol/L   CO2 26 22 - 32 mmol/L   Glucose, Bld 162 (H) 65 - 99 mg/dL   BUN 15 6 - 20 mg/dL   Creatinine, Ser 0.87 0.44 - 1.00 mg/dL   Calcium 10.2 8.9 - 10.3 mg/dL   Total Protein 8.9 (H) 6.5 - 8.1 g/dL   Albumin 4.7 3.5 - 5.0 g/dL   AST 36 15 - 41 U/L   ALT 31 14 - 54 U/L   Alkaline Phosphatase 122 38 - 126 U/L   Total Bilirubin 0.6 0.3 - 1.2 mg/dL   GFR calc non Af Amer >60 >60 mL/min   GFR calc Af Amer >60 >60 mL/min    Comment: (NOTE) The eGFR has been calculated using the CKD EPI equation. This calculation has not been validated in all clinical situations. eGFR's persistently <60 mL/min signify possible Chronic Kidney Disease.    Anion gap 17 (H) 5 - 15  CBC Status: Abnormal   Collection Time: 02/20/15 8:56 AM  Result Value Ref Range   WBC 17.3 (H) 4.0 - 10.5 K/uL   RBC 4.73 3.87 - 5.11 MIL/uL   Hemoglobin 13.7 12.0 - 15.0 g/dL   HCT 42.8 36.0 - 46.0 %   MCV 90.5 78.0 - 100.0 fL   MCH 29.0 26.0 - 34.0 pg   MCHC 32.0 30.0 - 36.0 g/dL   RDW 14.9 11.5 - 15.5 %   Platelets 366 150 - 400 K/uL  Urinalysis, Routine w reflex microscopic (not at [facility]) Status: Abnormal   Collection Time: 02/20/15 2:32 PM  Result Value Ref Range   Color, Urine YELLOW YELLOW   APPearance CLEAR CLEAR   Specific Gravity, Urine 1.031 (H) 1.005 - 1.030   pH 5.5 5.0 - 8.0   Glucose, UA NEGATIVE  NEGATIVE mg/dL   Hgb urine dipstick NEGATIVE NEGATIVE   Bilirubin Urine NEGATIVE NEGATIVE   Ketones, ur NEGATIVE NEGATIVE mg/dL   Protein, ur 100 (A) NEGATIVE mg/dL   Nitrite NEGATIVE NEGATIVE   Leukocytes, UA NEGATIVE NEGATIVE  Urine microscopic-add on Status: Abnormal   Collection Time: 02/20/15 2:32 PM  Result Value Ref Range   Squamous Epithelial / LPF 6-30 (A) NONE SEEN   WBC, UA 0-5 0 - 5 WBC/hpf   RBC / HPF 0-5 0 - 5 RBC/hpf   Bacteria, UA FEW (A) NONE SEEN   Urine-Other MUCOUS PRESENT        Imaging Results (Last 48 hours)    Ct Abdomen Pelvis W Contrast  02/20/2015 CLINICAL DATA: Left upper quadrant abdomen pain and nausea starting last night. EXAM: CT ABDOMEN AND PELVIS WITH CONTRAST TECHNIQUE: Multidetector CT imaging of the abdomen and pelvis was performed using the standard protocol following bolus administration of intravenous contrast. CONTRAST: 162m OMNIPAQUE IOHEXOL 300 MG/ML SOLN COMPARISON: October 15, 2004 FINDINGS: Patient status post prior cholecystectomy. There is postsurgical intra and extrahepatic biliary ductal dilatation with common duct bile duct measuring 1.5 cm. The liver is otherwise normal. No focal liver lesion is identified. The spleen, pancreas are normal. The adrenal glands are normal bilaterally. Bilateral parapelvic renal cysts are identified. There is no hydronephrosis bilaterally. There is a tiny cyst in the upper pole of right kidney. There is minimal atherosclerosis of the aorta. There is no aneurysmal dilatation. There is no abdominal lymphadenopathy. There is radiopaque banding around the GE junction. There are multiple dilated small bowel loops with transition probably in the distal jejunum or proximal ileum. The distal ileum loops are normal in caliber. There is diverticulosis of colon without diverticulitis. The patient is status post prior appendectomy. Partial fluid-filled bladder is  normal. The patient is status post prior hysterectomy. There is mild dependent atelectasis of the lung bases. Degenerative joint changes of the spine are identified. IMPRESSION: Dilated small bowel loops with transition probably in the distal jejunum or proximal ileum. The distal ileal loops are normal in caliber. The findings are consistent with early small bowel obstruction. Status post cholecystectomy with postsurgical intra and extrahepatic biliary ductal dilatation. Electronically Signed By: WAbelardo DieselM.D. On: 02/20/2015 14:29     Review of Systems  Constitutional: Negative.  Eyes: Negative.  Respiratory: Negative. Negative for cough, hemoptysis, sputum production, shortness of breath and wheezing.   She uses CPAP at night.  Cardiovascular: Negative.  Gastrointestinal: Positive for nausea, vomiting and abdominal pain. Negative for heartburn, diarrhea, constipation, blood in stool and melena.   Last BM was yesterday  Genitourinary: Negative.  Musculoskeletal: Positive for neck pain [facility] recently had some type of injection that make her neck pain better.).  Skin: Negative.  Neurological: Negative.  Endo/Heme/Allergies: Negative.  Psychiatric/Behavioral: Positive for depression.   Has been on Cymbalta since 2006.   Blood pressure 155/74, pulse 84, temperature 97.8 F (36.6 C), temperature source Oral, resp. rate 18, SpO2 95 %. Physical Exam  Constitutional: She appears well-developed and well-nourished. No distress.  No distress right now.  HENT:  Head: Normocephalic and atraumatic.  Nose: Nose normal.  Eyes: Conjunctivae and EOM are normal. Right eye exhibits no discharge. Left eye exhibits no discharge. No scleral icterus.  Neck: Normal range of motion. Neck supple. No JVD present. No tracheal deviation present. No thyromegaly present.  Cardiovascular: Normal rate, regular rhythm, normal heart sounds  and intact distal pulses.  No murmur heard. Respiratory: Effort normal and breath sounds normal. No respiratory distress. She has no wheezes. She has no rales. She exhibits no tenderness.  GI: Soft. She exhibits distension. She exhibits no mass. There is tenderness (less sore now after pain med and treatment for nausea.). There is no rebound and no guarding.  She has some bowel sounds  Mid line surgical incision, port noted on the left upper abdomen  Musculoskeletal: She exhibits no edema.  Lymphadenopathy:   She has no cervical adenopathy.  Neurological: She is alert. No cranial nerve deficit.  Skin: Skin is warm and dry. No rash noted. She is not diaphoretic. No erythema. No pallor.  Psychiatric: She  has a normal mood and affect. Her behavior is normal. Judgment and thought content normal.    Assessment/Plan: SBO s/p appendectomy, hysterectomy, cholecystectomy and prior laparoscopic gastric band placement. Obesity with lap band Sleep apnea Hypertension Hx of pancreatitis after ERCP/Acute respiratory failure with tracheostomy 2006 GERD Hx of hyperglycemia Anxiety/Depression hx  Plan: Admit, bowel rest, NG decompression, hydration, recheck labs and film in AM.   Edouard Gikas 02/20/2015, 3:24 PM

## 2015-02-20 NOTE — ED Provider Notes (Addendum)
CSN: [id number]     Arrival date & time 02/20/15  0820 History   First MD Initiated Contact with Patient 02/20/15 1133     Chief Complaint  Patient presents with  . Abdominal Pain  . Nausea     HPI  Patient has significant history of obesity, hypertension, previous appendectomy, cholecystectomy, previous small bowel traction, lap band. History of gallstone pancreatitis regarding pronged hospitalization, ICU stay, and respiratory failure on a vent.  Scratch abdominal pain is colicky and intermittent in nature up her abdomen starting at 02 100 this morning. Associated vomiting. Heme-negative nonbilious. Has not had passage of gas or stool today. States her  symptoms feel similar to her previous small bowel obstruction.  Past Medical History  Diagnosis Date  . Hyperglycemia   . History of pancreatitis   . GERD (gastroesophageal reflux disease)   . Anxiety disorder   . HTN (hypertension)   . History of tracheostomy (HCC)   . Respiratory failure (HCC)   . Chronic cough   . History of ERCP   . Hypokalemia   . Depression    Past Surgical History  Procedure Laterality Date  . Ercp    . Appendectomy    . Breast reduction surgery    . Cholecystectomy    . Abdominal hysterectomy    . Hammer toe surgery    . Tubal ligation    . Intestinal malrotation repair  1999   Family History  Problem Relation Age of Onset  . Diabetes Other   . Hypertension Other    Social History  Substance Use Topics  . Smoking status: Never Smoker   . Smokeless tobacco: Never Used  . Alcohol Use: No   OB History    No data available     Review of Systems  Constitutional: Negative for fever, chills, diaphoresis, appetite change and fatigue.  HENT: Negative for mouth sores, sore throat and trouble swallowing.   Eyes: Negative for visual disturbance.  Respiratory: Negative for cough, chest tightness, shortness of breath and wheezing.   Cardiovascular: Negative for chest pain.  Gastrointestinal:  Positive for nausea, vomiting, abdominal pain and abdominal distention. Negative for diarrhea.  Endocrine: Negative for polydipsia, polyphagia and polyuria.  Genitourinary: Negative for dysuria, frequency and hematuria.  Musculoskeletal: Negative for gait problem.  Skin: Negative for color change, pallor and rash.  Neurological: Negative for dizziness, syncope, light-headedness and headaches.  Hematological: Does not bruise/bleed easily.  Psychiatric/Behavioral: Negative for behavioral problems and confusion.      Allergies  Fastin and Lisinopril  Home Medications   Prior to Admission medications   Medication Sig Start Date End Date Taking? Authorizing Provider  aspirin 325 MG tablet Take 325 mg by mouth daily as needed for mild pain, moderate pain or headache.   Yes Historical Provider, MD  DULoxetine (CYMBALTA) 30 MG capsule TAKE 1 CAPSULE DAILY Patient taking differently: TAKE 30 MG BY MOUTH  DAILY 09/18/14  Yes Etta Grandchild, MD  ibuprofen (ADVIL) 200 MG tablet Take 600 mg by mouth every 8 (eight) hours as needed for headache or mild pain.  04/21/13  Yes Etta Grandchild, MD  montelukast (SINGULAIR) 10 MG tablet Take 1 tablet (10 mg total) by mouth at bedtime. 04/24/14  Yes Etta Grandchild, MD  Multiple Vitamins-Minerals (MULTIVITAMIN,TX-MINERALS) tablet Take 1 tablet by mouth daily.     Yes Historical Provider, MD  potassium chloride SA (KLOR-CON M20) 20 MEQ tablet Take 1 tablet (20 mEq total) by mouth once. Patient taking  differently: Take 20 mEq by mouth 3 (three) times a week.  09/15/14  Yes Etta Grandchild, MD  Probiotic Product (PROBIOTIC DAILY PO) Take 1 tablet by mouth daily.   Yes Historical Provider, MD  triamterene-hydrochlorothiazide (DYAZIDE) 37.5-25 MG per capsule TAKE 1 CAPSULE DAILY 03/10/14  Yes Etta Grandchild, MD  Vitamin D, Ergocalciferol, (DRISDOL) 50000 UNITS CAPS capsule Take 1 capsule (50,000 Units total) by mouth once a week. 09/15/14  Yes Etta Grandchild, MD  azelastine  (ASTELIN) 137 MCG/SPRAY nasal spray Place 2 sprays into the nose 2 (two) times daily. Use in each nostril as directed Patient not taking: Reported on 02/20/2015 05/28/12   Etta Grandchild, MD  phentermine 37.5 MG capsule Take 1 capsule (37.5 mg total) by mouth every morning. Patient not taking: Reported on 02/20/2015 10/18/13   Etta Grandchild, MD   BP 155/74 mmHg  Pulse 84  Temp(Src) 97.8 F (36.6 C) (Oral)  Resp 18  SpO2 95% Physical Exam  Constitutional: She is oriented to person, place, and time. She appears well-developed and well-nourished. No distress.  HENT:  Head: Normocephalic.  Eyes: Conjunctivae are normal. Pupils are equal, round, and reactive to light. No scleral icterus.  Neck: Normal range of motion. Neck supple. No thyromegaly present.  Cardiovascular: Normal rate and regular rhythm.  Exam reveals no gallop and no friction rub.   No murmur heard. Pulmonary/Chest: Effort normal and breath sounds normal. No respiratory distress. She has no wheezes. She has no rales.  Abdominal: Soft. Bowel sounds are normal. She exhibits no distension. There is no tenderness. There is no rebound.    Musculoskeletal: Normal range of motion.  Neurological: She is alert and oriented to person, place, and time.  Skin: Skin is warm and dry. No rash noted.  Psychiatric: She has a normal mood and affect. Her behavior is normal.    ED Course  Procedures (including critical care time) Labs Review Labs Reviewed  COMPREHENSIVE METABOLIC PANEL - Abnormal; Notable for the following:    Potassium 3.4 (*)    Chloride 98 (*)    Glucose, Bld 162 (*)    Total Protein 8.9 (*)    Anion gap 17 (*)    All other components within normal limits  CBC - Abnormal; Notable for the following:    WBC 17.3 (*)    All other components within normal limits  URINALYSIS, ROUTINE W REFLEX MICROSCOPIC (NOT AT [facility]) - Abnormal; Notable for the following:    Specific Gravity, Urine 1.031 (*)    Protein, ur 100 (*)      All other components within normal limits  URINE MICROSCOPIC-ADD ON - Abnormal; Notable for the following:    Squamous Epithelial / LPF 6-30 (*)    Bacteria, UA FEW (*)    All other components within normal limits  LIPASE, BLOOD    Imaging Review Ct Abdomen Pelvis W Contrast  02/20/2015  CLINICAL DATA:  Left upper quadrant abdomen pain and nausea starting last night. EXAM: CT ABDOMEN AND PELVIS WITH CONTRAST TECHNIQUE: Multidetector CT imaging of the abdomen and pelvis was performed using the standard protocol following bolus administration of intravenous contrast. CONTRAST:  OMNIPAQUE IOHEXOL 300 MG/ML  SOLN COMPARISON:  October 15, 2004 FINDINGS: Patient status post prior cholecystectomy. There is postsurgical intra and extrahepatic biliary ductal dilatation with common duct bile duct measuring 1.5 cm. The liver is otherwise normal. No focal liver lesion is identified. The spleen, pancreas are normal. The adrenal glands are  normal bilaterally. Bilateral parapelvic renal cysts are identified. There is no hydronephrosis bilaterally. There is a tiny cyst in the upper pole of right kidney. There is minimal atherosclerosis of the aorta. There is no aneurysmal dilatation. There is no abdominal lymphadenopathy. There is radiopaque banding around the GE junction. There are multiple dilated small bowel loops with transition probably in the distal jejunum or proximal ileum. The distal ileum loops are normal in caliber. There is diverticulosis of colon without diverticulitis. The patient is status post prior appendectomy. Partial fluid-filled bladder is normal. The patient is status post prior hysterectomy. There is mild dependent atelectasis of the lung bases. Degenerative joint changes of the spine are identified. IMPRESSION: Dilated small bowel loops with transition probably in the distal jejunum or proximal ileum. The distal ileal loops are normal in caliber. The findings are consistent with early  small bowel obstruction. Status post cholecystectomy with postsurgical intra and extrahepatic biliary ductal dilatation. Electronically Signed   By: Sherian Rein M.D.   On: 02/20/2015 14:29   I have personally reviewed and evaluated these images and lab results as part of my medical decision-making.   EKG Interpretation None      MDM   Final diagnoses:  SBO (small bowel obstruction) (HCC)    CT scan shows small bowel traction. Eyelid small bowel loops with transition in the distal jejunum and proximal ileum. Decompressed distal ileal loops. No sign of perforation or other complication.  Plan is NG tube. I have placed a call to general surgery, as well as hospitalist to discuss disposition.  :02/20/15 1512:  Spoke with Dr.Krishnan or [facility], and Will Casimer Bilis of [facility].  Both will see pt, and decide admit/consult.  Rolland Porter, MD 02/20/15 1500  Rolland Porter, MD     Rolland Porter, MD 02/20/15 1524  Rolland Porter, MD 02/20/15 [phone]

## 2015-02-20 NOTE — ED Notes (Signed)
Accidentally clicked off UA.  Will continue to try to get sample

## 2015-02-20 NOTE — ED Notes (Signed)
Pt c/o LUQ abdominal pain and nausea starting last night.  Pain score 10/10.  Pt has not taken anything for pain.  Sts that laying down decreases pain.  Hx of pancreatitis x 10 years ago.

## 2015-02-21 ENCOUNTER — Inpatient Hospital Stay (HOSPITAL_COMMUNITY): Payer: PRIVATE HEALTH INSURANCE

## 2015-02-21 LAB — CBC
HCT: 37.4 % (ref 36.0–46.0)
Hemoglobin: 11.6 g/dL — ABNORMAL LOW (ref 12.0–15.0)
MCH: 29.2 pg (ref 26.0–34.0)
MCHC: 31 g/dL (ref 30.0–36.0)
MCV: 94.2 fL (ref 78.0–100.0)
Platelets: 319 10*3/uL (ref 150–400)
RBC: 3.97 MIL/uL (ref 3.87–5.11)
RDW: 15.5 % (ref 11.5–15.5)
WBC: 9.1 10*3/uL (ref 4.0–10.5)

## 2015-02-21 LAB — BASIC METABOLIC PANEL
Anion gap: 9 (ref 5–15)
BUN: 13 mg/dL (ref 6–20)
CO2: 29 mmol/L (ref 22–32)
Calcium: 8.6 mg/dL — ABNORMAL LOW (ref 8.9–10.3)
Chloride: 103 mmol/L (ref 101–111)
Creatinine, Ser: 0.91 mg/dL (ref 0.44–1.00)
GFR calc Af Amer: >60 mL/min (ref >60)
GFR calc non Af Amer: >60 mL/min (ref >60)
Glucose, Bld: 118 mg/dL — ABNORMAL HIGH (ref 65–99)
Potassium: 4.5 mmol/L (ref 3.5–5.1)
Sodium: 141 mmol/L (ref 135–145)

## 2015-02-21 MED ORDER — LIDOCAINE HCL 2 % EX GEL
1.0000 "application " | Freq: Once | CUTANEOUS | Status: AC
  Administered 2015-02-21: 1 via TOPICAL
  Filled 2015-02-21: qty 5

## 2015-02-21 MED ORDER — LORAZEPAM 2 MG/ML IJ SOLN
1.0000 mg | Freq: Once | INTRAMUSCULAR | Status: AC
  Administered 2015-02-21: 1 mg via INTRAVENOUS
  Filled 2015-02-21: qty 1

## 2015-02-21 NOTE — ED Notes (Signed)
I have attempted to insert the ordered NG without success. I was assisted by the assigned RN. Although able to auscultate the swoosh air sound in the epigastric region, I am unable to aspirate gastric content. Pt tolerated the procedure poorly, requests that we take out the NG tube, which we do, noticing some scant blood on the tube. I have paged the surgeon Dr. Norva Riffle who called me back and I explained these. No new orders at this time.

## 2015-02-21 NOTE — Progress Notes (Signed)
Patient ID: [name], female   DOB: 1955/06/02, 60 y.o.   MRN: [id number]     [address] [place], [place] [address]    Phone: [phone] FAX: [phone]     Subjective: Nauseated, no vomiting.  Last BM Monday, no flatus. AXR unchanged.   Objective:  Vital signs:  Filed Vitals:   02/20/15 2319 02/21/15 0000 02/21/15 0139 02/21/15 0558  BP: 133/79 133/74 144/68 122/67  Pulse: 69 68 67 66  Temp: 98.4 F (36.9 C)  98.1 F (36.7 C) 98.8 F (37.1 C)  TempSrc: Oral  Oral Oral  Resp: _0 Height:   _1  (1.6 m)   Weight:   122.29 kg (269 lb 9.6 oz)   SpO2: 100% 99% 97% 96%    Last BM Date: 02/19/15  Intake/Output   Yesterday:  02/14 0701 - 02/15 0700 In: 1246.7 [I.V.:1246.7] Out: 200 [Urine:200] This shift:    I/O last 3 completed shifts: In: 1246.7 [I.V.:1246.7] Out: 200 [Urine:200]   Physical Exam: General: Pt awake/alert/oriented x4 in no  acute distress Chest: cta.  No chest wall pain w good excursion CV:  Pulses intact.  Regular rhythm MS: Normal AROM mjr joints.  No obvious deformity Abdomen: Soft.  Nondistended.  Non tender.  No evidence of peritonitis.  No incarcerated hernias. Ext:  No mjr edema.  No cyanosis Skin: No petechiae / purpura   Problem List:   Active Problems:   SBO (small bowel obstruction) (HCC)    Results:   Labs: Results for orders placed or performed during the hospital encounter of 02/20/15 (from the past 48 hour(s))  Lipase, blood     Status: None   Collection Time: 02/20/15  8:56 AM  Result Value Ref Range   Lipase 21 11 - 51 U/L  Comprehensive metabolic panel     Status: Abnormal   Collection Time: 02/20/15  8:56 AM  Result Value Ref Range   Sodium 141 135 - 145 mmol/L   Potassium 3.4 (L) 3.5 - 5.1 mmol/L   Chloride 98 (L) 101 - 111 mmol/L   CO2 26 22 - 32 mmol/L   Glucose, Bld 162 (H) 65 - 99 mg/dL   BUN 15 6 - 20 mg/dL   Creatinine,  Ser 0.87 0.44 - 1.00 mg/dL   Calcium 10.2 8.9 - 10.3 mg/dL   Total Protein 8.9 (H) 6.5 - 8.1 g/dL   Albumin 4.7 3.5 - 5.0 g/dL   AST 36 15 - 41 U/L   ALT 31 14 - 54 U/L   Alkaline Phosphatase 122 38 - 126 U/L   Total Bilirubin 0.6 0.3 - 1.2 mg/dL   GFR calc non Af Amer >60 >60 mL/min   GFR calc Af Amer >60 >60 mL/min    Comment: (NOTE) The eGFR has been calculated using the CKD EPI equation. This calculation has not been validated in all clinical situations. eGFR's persistently <60 mL/min signify possible Chronic Kidney Disease.    Anion gap 17 (H) 5 - 15  CBC     Status: Abnormal   Collection Time: 02/20/15  8:56 AM  Result Value Ref Range   WBC 17.3 (H) 4.0 - 10.5 K/uL   RBC 4.73 3.87 - 5.11 MIL/uL   Hemoglobin 13.7 12.0 - 15.0 g/dL   HCT 42.8 36.0 - 46.0 %   MCV 90.5 78.0 - 100.0 fL   MCH 29.0 26.0 - 34.0  pg   MCHC 32.0 30.0 - 36.0 g/dL   RDW 14.9 11.5 - 15.5 %   Platelets 366 150 - 400 K/uL  Urinalysis, Routine w reflex microscopic (not at [facility])     Status: Abnormal   Collection Time: 02/20/15  2:32 PM  Result Value Ref Range   Color, Urine YELLOW YELLOW   APPearance CLEAR CLEAR   Specific Gravity, Urine 1.031 (H) 1.005 - 1.030   pH 5.5 5.0 - 8.0   Glucose, UA NEGATIVE NEGATIVE mg/dL   Hgb urine dipstick NEGATIVE NEGATIVE   Bilirubin Urine NEGATIVE NEGATIVE   Ketones, ur NEGATIVE NEGATIVE mg/dL   Protein, ur 100 (A) NEGATIVE mg/dL   Nitrite NEGATIVE NEGATIVE   Leukocytes, UA NEGATIVE NEGATIVE  Urine microscopic-add on     Status: Abnormal   Collection Time: 02/20/15  2:32 PM  Result Value Ref Range   Squamous Epithelial / LPF 6-30 (A) NONE SEEN   WBC, UA 0-5 0 - 5 WBC/hpf   RBC / HPF 0-5 0 - 5 RBC/hpf   Bacteria, UA FEW (A) NONE SEEN   Urine-Other MUCOUS PRESENT   Basic metabolic panel     Status: Abnormal   Collection Time: 02/21/15  4:29 AM  Result Value Ref Range   Sodium 141 135 - 145 mmol/L   Potassium 4.5 3.5 - 5.1 mmol/L    Comment: DELTA CHECK  NOTED REPEATED TO VERIFY    Chloride 103 101 - 111 mmol/L   CO2 29 22 - 32 mmol/L   Glucose, Bld 118 (H) 65 - 99 mg/dL   BUN 13 6 - 20 mg/dL   Creatinine, Ser 0.91 0.44 - 1.00 mg/dL   Calcium 8.6 (L) 8.9 - 10.3 mg/dL   GFR calc non Af Amer >60 >60 mL/min   GFR calc Af Amer >60 >60 mL/min    Comment: (NOTE) The eGFR has been calculated using the CKD EPI equation. This calculation has not been validated in all clinical situations. eGFR's persistently <60 mL/min signify possible Chronic Kidney Disease.    Anion gap 9 5 - 15  CBC     Status: Abnormal   Collection Time: 02/21/15  4:29 AM  Result Value Ref Range   WBC 9.1 4.0 - 10.5 K/uL   RBC 3.97 3.87 - 5.11 MIL/uL   Hemoglobin 11.6 (L) 12.0 - 15.0 g/dL   HCT 37.4 36.0 - 46.0 %   MCV 94.2 78.0 - 100.0 fL   MCH 29.2 26.0 - 34.0 pg   MCHC 31.0 30.0 - 36.0 g/dL   RDW 15.5 11.5 - 15.5 %   Platelets 319 150 - 400 K/uL    Imaging / Studies: Ct Abdomen Pelvis W Contrast  02/20/2015  CLINICAL DATA:  Left upper quadrant abdomen pain and nausea starting last night. EXAM: CT ABDOMEN AND PELVIS WITH CONTRAST TECHNIQUE: Multidetector CT imaging of the abdomen and pelvis was performed using the standard protocol following bolus administration of intravenous contrast. CONTRAST:  171m OMNIPAQUE IOHEXOL 300 MG/ML  SOLN COMPARISON:  October 15, 2004 FINDINGS: Patient status post prior cholecystectomy. There is postsurgical intra and extrahepatic biliary ductal dilatation with common duct bile duct measuring 1.5 cm. The liver is otherwise normal. No focal liver lesion is identified. The spleen, pancreas are normal. The adrenal glands are normal bilaterally. Bilateral parapelvic renal cysts are identified. There is no hydronephrosis bilaterally. There is a tiny cyst in the upper pole of right kidney. There is minimal atherosclerosis of the aorta. There is no aneurysmal  dilatation. There is no abdominal lymphadenopathy. There is radiopaque banding around  the GE junction. There are multiple dilated small bowel loops with transition probably in the distal jejunum or proximal ileum. The distal ileum loops are normal in caliber. There is diverticulosis of colon without diverticulitis. The patient is status post prior appendectomy. Partial fluid-filled bladder is normal. The patient is status post prior hysterectomy. There is mild dependent atelectasis of the lung bases. Degenerative joint changes of the spine are identified. IMPRESSION: Dilated small bowel loops with transition probably in the distal jejunum or proximal ileum. The distal ileal loops are normal in caliber. The findings are consistent with early small bowel obstruction. Status post cholecystectomy with postsurgical intra and extrahepatic biliary ductal dilatation. Electronically Signed   By: Abelardo Diesel M.D.   On: 02/20/2015 14:29   Dg Abd 2 Views  02/21/2015  CLINICAL DATA:  Followup small bowel obstruction. Persistent diffuse abdominal pain. EXAM: ABDOMEN - 2 VIEW COMPARISON:  CT 02/20/2015. FINDINGS: Lap band remains visible with a normal orientation. Surgical clips in the right upper quadrant of the abdomen. Persistent small bowel obstruction pattern with dilated fluid and air-filled loops of small intestine. No free air. IMPRESSION: Persistent small bowel obstruction pattern.  No free air. Electronically Signed   By: Nelson Chimes M.D.   On: 02/21/2015 08:13    Medications / Allergies:  Scheduled Meds: . heparin  5,000 Units Subcutaneous 3 times per day   Continuous Infusions: . 0.9 % NaCl with KCl 20 mEq / L 1,000 mL (02/21/15 0331)   PRN Meds:.acetaminophen **OR** acetaminophen, diphenhydrAMINE **OR** diphenhydrAMINE, hydrALAZINE, HYDROmorphone (DILAUDID) injection, iohexol, ondansetron **OR** ondansetron (ZOFRAN) IV  Antibiotics: Anti-infectives    None        Assessment/Plan Hx appendectomy, hysterectomy, cholecystectomy, lap gastric band  SBO-place NGT, will consider  SBO protocol after decompression.  NPO, IVF, pain control OSA-CPAP HTN-Hydralazine PRN Depression/anxiety-resume home meds when tolerating POs VTE prophylaxis-SCD/lheparin FEN-NPO, IVF   Erby Pian, ANP-BC [facility]   02/21/2015 8:33 AM

## 2015-02-21 NOTE — Progress Notes (Signed)
Attempted to place ng tube twice without success. Marland Kitchen Pt firmly refuses further  attempts.

## 2015-02-21 NOTE — Progress Notes (Signed)
Attempted to place NG tube. NG inserted into left nare, patient started to gag and stated "no, I can't get it out!". RN pulled NG back. RN discussed with patient the need for NG patient refused re-attempt. NP [place] notified. Will continue to monitor patient. C.Fiorela Pelzer,RN

## 2015-02-22 ENCOUNTER — Ambulatory Visit (HOSPITAL_COMMUNITY): Payer: PRIVATE HEALTH INSURANCE

## 2015-02-22 LAB — URINE CULTURE: Special Requests: NORMAL

## 2015-02-22 MED ORDER — TRIAMTERENE-HCTZ 37.5-25 MG PO CAPS
1.0000 | ORAL_CAPSULE | Freq: Every day | ORAL | Status: DC
  Administered 2015-02-22: 1 via ORAL
  Filled 2015-02-22 (×2): qty 1

## 2015-02-22 MED ORDER — MONTELUKAST SODIUM 10 MG PO TABS
10.0000 mg | ORAL_TABLET | Freq: Every day | ORAL | Status: DC
  Administered 2015-02-22 – 2015-02-24 (×3): 10 mg via ORAL
  Filled 2015-02-22 (×4): qty 1

## 2015-02-22 MED ORDER — DULOXETINE HCL 30 MG PO CPEP
30.0000 mg | ORAL_CAPSULE | Freq: Every day | ORAL | Status: DC
  Administered 2015-02-22 – 2015-02-25 (×4): 30 mg via ORAL
  Filled 2015-02-22 (×4): qty 1

## 2015-02-22 NOTE — Progress Notes (Signed)
Patient ID: [name], female   DOB: 11-17-1955, 60 y.o.   MRN: [id number]     [address] [place], [place] [address]    Phone: [phone] FAX: [phone]     Subjective: Started passing flatus this AM. Nausea, no vomiting. C/o headache.   Objective:  Vital signs:  Filed Vitals:   02/21/15 2000 02/21/15 2155 02/21/15 2203 02/22/15 0503  BP: 136/60 131/73  128/50  Pulse: 89 87  96  Temp: 99.6 F (37.6 C) 99.4 F (37.4 C)  99.7 F (37.6 C)  TempSrc:  Oral  Oral  Resp: _0 Height:      Weight:      SpO2: 100% 97%  90%    Last BM Date: 02/19/15  Intake/Output   Yesterday:  02/15 0701 - 02/16 0700 In: 2460 [P.O.:60; I.V.:2400] Out: 1100 [Urine:1100] This shift:      Physical Exam: General: Pt awake/alert/oriented x4 in no acute distress  Abdomen: Soft.  Nondistended.  Mild ttp llq.  No evidence of peritonitis.  No incarcerated hernias.    Problem List:   Active Problems:   SBO (small bowel obstruction) (HCC)    Results:   Labs: Results for orders placed or performed during the hospital encounter of 02/20/15 (from the past 48 hour(s))  Lipase, blood     Status: None   Collection Time: 02/20/15  8:56 AM  Result Value Ref Range   Lipase 21 11 - 51 U/L  Comprehensive metabolic panel     Status: Abnormal   Collection Time: 02/20/15  8:56 AM  Result Value Ref Range   Sodium 141 135 - 145 mmol/L   Potassium 3.4 (L) 3.5 - 5.1 mmol/L   Chloride 98 (L) 101 - 111 mmol/L   CO2 26 22 - 32 mmol/L   Glucose, Bld 162 (H) 65 - 99 mg/dL   BUN 15 6 - 20 mg/dL   Creatinine, Ser 0.87 0.44 - 1.00 mg/dL   Calcium 10.2 8.9 - 10.3 mg/dL   Total Protein 8.9 (H) 6.5 - 8.1 g/dL   Albumin 4.7 3.5 - 5.0 g/dL   AST 36 15 - 41 U/L   ALT 31 14 - 54 U/L   Alkaline Phosphatase 122 38 - 126 U/L   Total Bilirubin 0.6 0.3 - 1.2 mg/dL   GFR calc non Af Amer >60 >60 mL/min   GFR calc Af Amer >60 >60  mL/min    Comment: (NOTE) The eGFR has been calculated using the CKD EPI equation. This calculation has not been validated in all clinical situations. eGFR's persistently <60 mL/min signify possible Chronic Kidney Disease.    Anion gap 17 (H) 5 - 15  CBC     Status: Abnormal   Collection Time: 02/20/15  8:56 AM  Result Value Ref Range   WBC 17.3 (H) 4.0 - 10.5 K/uL   RBC 4.73 3.87 - 5.11 MIL/uL   Hemoglobin 13.7 12.0 - 15.0 g/dL   HCT 42.8 36.0 - 46.0 %   MCV 90.5 78.0 - 100.0 fL   MCH 29.0 26.0 - 34.0 pg   MCHC 32.0 30.0 - 36.0 g/dL   RDW 14.9 11.5 - 15.5 %   Platelets 366 150 - 400 K/uL  Urinalysis, Routine w reflex microscopic (not at [facility])     Status: Abnormal   Collection Time: 02/20/15  2:32 PM  Result Value Ref Range   Color,  Urine YELLOW YELLOW   APPearance CLEAR CLEAR   Specific Gravity, Urine 1.031 (H) 1.005 - 1.030   pH 5.5 5.0 - 8.0   Glucose, UA NEGATIVE NEGATIVE mg/dL   Hgb urine dipstick NEGATIVE NEGATIVE   Bilirubin Urine NEGATIVE NEGATIVE   Ketones, ur NEGATIVE NEGATIVE mg/dL   Protein, ur 100 (A) NEGATIVE mg/dL   Nitrite NEGATIVE NEGATIVE   Leukocytes, UA NEGATIVE NEGATIVE  Urine microscopic-add on     Status: Abnormal   Collection Time: 02/20/15  2:32 PM  Result Value Ref Range   Squamous Epithelial / LPF 6-30 (A) NONE SEEN   WBC, UA 0-5 0 - 5 WBC/hpf   RBC / HPF 0-5 0 - 5 RBC/hpf   Bacteria, UA FEW (A) NONE SEEN   Urine-Other MUCOUS PRESENT   Basic metabolic panel     Status: Abnormal   Collection Time: 02/21/15  4:29 AM  Result Value Ref Range   Sodium 141 135 - 145 mmol/L   Potassium 4.5 3.5 - 5.1 mmol/L    Comment: DELTA CHECK NOTED REPEATED TO VERIFY    Chloride 103 101 - 111 mmol/L   CO2 29 22 - 32 mmol/L   Glucose, Bld 118 (H) 65 - 99 mg/dL   BUN 13 6 - 20 mg/dL   Creatinine, Ser 0.91 0.44 - 1.00 mg/dL   Calcium 8.6 (L) 8.9 - 10.3 mg/dL   GFR calc non Af Amer >60 >60 mL/min   GFR calc Af Amer >60 >60 mL/min    Comment: (NOTE) The  eGFR has been calculated using the CKD EPI equation. This calculation has not been validated in all clinical situations. eGFR's persistently <60 mL/min signify possible Chronic Kidney Disease.    Anion gap 9 5 - 15  CBC     Status: Abnormal   Collection Time: 02/21/15  4:29 AM  Result Value Ref Range   WBC 9.1 4.0 - 10.5 K/uL   RBC 3.97 3.87 - 5.11 MIL/uL   Hemoglobin 11.6 (L) 12.0 - 15.0 g/dL   HCT 37.4 36.0 - 46.0 %   MCV 94.2 78.0 - 100.0 fL   MCH 29.2 26.0 - 34.0 pg   MCHC 31.0 30.0 - 36.0 g/dL   RDW 15.5 11.5 - 15.5 %   Platelets 319 150 - 400 K/uL    Imaging / Studies: Ct Abdomen Pelvis W Contrast  02/20/2015  CLINICAL DATA:  Left upper quadrant abdomen pain and nausea starting last night. EXAM: CT ABDOMEN AND PELVIS WITH CONTRAST TECHNIQUE: Multidetector CT imaging of the abdomen and pelvis was performed using the standard protocol following bolus administration of intravenous contrast. CONTRAST:  161m OMNIPAQUE IOHEXOL 300 MG/ML  SOLN COMPARISON:  October 15, 2004 FINDINGS: Patient status post prior cholecystectomy. There is postsurgical intra and extrahepatic biliary ductal dilatation with common duct bile duct measuring 1.5 cm. The liver is otherwise normal. No focal liver lesion is identified. The spleen, pancreas are normal. The adrenal glands are normal bilaterally. Bilateral parapelvic renal cysts are identified. There is no hydronephrosis bilaterally. There is a tiny cyst in the upper pole of right kidney. There is minimal atherosclerosis of the aorta. There is no aneurysmal dilatation. There is no abdominal lymphadenopathy. There is radiopaque banding around the GE junction. There are multiple dilated small bowel loops with transition probably in the distal jejunum or proximal ileum. The distal ileum loops are normal in caliber. There is diverticulosis of colon without diverticulitis. The patient is status post prior appendectomy. Partial fluid-filled bladder  is normal. The  patient is status post prior hysterectomy. There is mild dependent atelectasis of the lung bases. Degenerative joint changes of the spine are identified. IMPRESSION: Dilated small bowel loops with transition probably in the distal jejunum or proximal ileum. The distal ileal loops are normal in caliber. The findings are consistent with early small bowel obstruction. Status post cholecystectomy with postsurgical intra and extrahepatic biliary ductal dilatation. Electronically Signed   By: Abelardo Diesel M.D.   On: 02/20/2015 14:29   Dg Abd 2 Views  02/21/2015  CLINICAL DATA:  Followup small bowel obstruction. Persistent diffuse abdominal pain. EXAM: ABDOMEN - 2 VIEW COMPARISON:  CT 02/20/2015. FINDINGS: Lap band remains visible with a normal orientation. Surgical clips in the right upper quadrant of the abdomen. Persistent small bowel obstruction pattern with dilated fluid and air-filled loops of small intestine. No free air. IMPRESSION: Persistent small bowel obstruction pattern.  No free air. Electronically Signed   By: Nelson Chimes M.D.   On: 02/21/2015 08:13    Medications / Allergies:  Scheduled Meds: . DULoxetine  30 mg Oral Daily  . heparin  5,000 Units Subcutaneous 3 times per day  . montelukast  10 mg Oral QHS  . triamterene-hydrochlorothiazide  1 capsule Oral Daily   Continuous Infusions: . 0.9 % NaCl with KCl 20 mEq / L 100 mL/hr at 02/21/15 2327   PRN Meds:.acetaminophen **OR** acetaminophen, diphenhydrAMINE **OR** diphenhydrAMINE, hydrALAZINE, HYDROmorphone (DILAUDID) injection, iohexol, ondansetron **OR** ondansetron (ZOFRAN) IV  Antibiotics: Anti-infectives    None       Assessment/Plan Hx appendectomy, hysterectomy, cholecystectomy, lap gastric band  SBO-clinically improving, check AXR for progression of contrast.  OSA-CPAP HTN-resume home meds Depression/anxiety-resume cymbalta VTE prophylaxis-SCD/lheparin FEN-ice chips/sips, IVF   Erby Pian, ANP-BC [facility]   02/22/2015 8:15 AM

## 2015-02-22 NOTE — Progress Notes (Signed)
Patient placed herself on CPAP for the night ? ?

## 2015-02-23 LAB — URINALYSIS, ROUTINE W REFLEX MICROSCOPIC
Bilirubin Urine: NEGATIVE
Glucose, UA: NEGATIVE mg/dL
Hgb urine dipstick: NEGATIVE
Ketones, ur: NEGATIVE mg/dL
Leukocytes, UA: NEGATIVE
Nitrite: NEGATIVE
Protein, ur: NEGATIVE mg/dL
Specific Gravity, Urine: 1.009 (ref 1.005–1.030)
pH: 7 (ref 5.0–8.0)

## 2015-02-23 MED ORDER — TRIAMTERENE-HCTZ 37.5-25 MG PO TABS
1.0000 | ORAL_TABLET | Freq: Every day | ORAL | Status: DC
  Administered 2015-02-23 – 2015-02-25 (×3): 1 via ORAL
  Filled 2015-02-23 (×3): qty 1

## 2015-02-23 MED ORDER — BISACODYL 10 MG RE SUPP
10.0000 mg | Freq: Once | RECTAL | Status: AC
  Administered 2015-02-23: 10 mg via RECTAL
  Filled 2015-02-23: qty 1

## 2015-02-23 NOTE — Progress Notes (Signed)
Patient ID: [name], female   DOB: 07/05/1955, 60 y.o.   MRN: [id number]     [facility]      [address]   [place], [place] [address]    Phone: [phone] FAX: [phone]     Subjective: C/o headache, rhinorrhea, back on allergy meds. Passing flatus.  No BM. VSS.  Afebrile.   Objective:  Vital signs:  Filed Vitals:   02/22/15 1408 02/22/15 1410 02/22/15 2118 02/23/15 0509  BP:  121/54 122/67 160/85  Pulse:  102 57 97  Temp: 99.9 F (37.7 C) 99.9 F (37.7 C) 98.6 F (37 C) 98.1 F (36.7 C)  TempSrc:  Oral Oral Oral  Resp:  Height:      Weight:      SpO2:  93% 94% 93%    Last BM Date: 02/19/15  Intake/Output   Yesterday:  02/16 0701 - 02/17 0700 In: 2420 [P.O.:20; I.V.:2400] Out: 400 [Urine:400] This shift:    I/O last 3 completed shifts: In: 3620 [P.O.:20; I.V.:3600] Out: 1150 [Urine:1150]     Physical Exam: General: Pt awake/alert/oriented x4 in no acute distress  Abdomen: Soft. Nondistended. non tender. No evidence of peritonitis. No incarcerated hernias.   Problem List:   Active Problems:   SBO (small bowel obstruction) (HCC)    Results:   Labs: No results found for this or any previous visit (from the past 48 hour(s)).  Imaging / Studies: Dg Abd 2 Views  02/22/2015  CLINICAL DATA:  Pt has been constipated since Monday with abd pain and distention/bloating. Pt was told she has a blocked bowel SBO. Pt refused panda tube yesterday. EXAM: ABDOMEN - 2 VIEW COMPARISON:  02/21/2015 FINDINGS: Left upper quadrant left and is in place, oriented 2 o'clock-8 o'clock. Lap band port is identified in the left mid abdomen. There is air-fluid level within the proximal stomach. Contrast is identified in colonic loops. There is persistent dilatation of small bowel loops. Contrast identified in the urinary bladder. No free intraperitoneal air. IMPRESSION: 1. Persistent small bowel dilatation. 2.  Given the presence of contrast in the colon, findings are consistent with partial small bowel obstruction. Electronically Signed   By: Norva Pavlov M.D.   On: 02/22/2015 10:09    Medications / Allergies:  Scheduled Meds: . bisacodyl  10 mg Rectal Once  . DULoxetine  30 mg Oral Daily  . heparin  5,000 Units Subcutaneous 3 times per day  . montelukast  10 mg Oral QHS  . triamterene-hydrochlorothiazide  1 tablet Oral Daily   Continuous Infusions: . 0.9 % NaCl with KCl 20 mEq / L 100 mL/hr at 02/23/15 0605   PRN Meds:.acetaminophen **OR** acetaminophen, diphenhydrAMINE **OR** diphenhydrAMINE, hydrALAZINE, HYDROmorphone (DILAUDID) injection, iohexol, ondansetron **OR** ondansetron (ZOFRAN) IV  Antibiotics: Anti-infectives    None       Assessment/Plan Hx appendectomy, hysterectomy, cholecystectomy, lap gastric band  SBO-clinically improving, check AXR for progression of contrast. Advance to fulls, give dulcolax suppository  OSA-CPAP HTN-home meds Depression/anxiety-resume cymbalta VTE prophylaxis-SCD/lheparin FEN-fulls, SLIV    Ashok Norris, ANP-BC [facility] Pager [phone](7A-4:30P)   02/23/2015 9:51 AM

## 2015-02-24 LAB — BASIC METABOLIC PANEL
Anion gap: 7 (ref 5–15)
BUN: 6 mg/dL (ref 6–20)
CO2: 30 mmol/L (ref 22–32)
Calcium: 8.9 mg/dL (ref 8.9–10.3)
Chloride: 101 mmol/L (ref 101–111)
Creatinine, Ser: 0.78 mg/dL (ref 0.44–1.00)
GFR calc Af Amer: >60 mL/min (ref >60)
GFR calc non Af Amer: >60 mL/min (ref >60)
Glucose, Bld: 104 mg/dL — ABNORMAL HIGH (ref 65–99)
Potassium: 3.8 mmol/L (ref 3.5–5.1)
Sodium: 138 mmol/L (ref 135–145)

## 2015-02-24 MED ORDER — BOOST / RESOURCE BREEZE PO LIQD
1.0000 | Freq: Two times a day (BID) | ORAL | Status: DC
  Administered 2015-02-24 – 2015-02-25 (×3): 1 via ORAL

## 2015-02-24 NOTE — Progress Notes (Signed)
  Subjective: Doing well. 2 small BMs. Having more flatus than burping. Thick liquids. Ambulated.   Objective: Vital signs in last 24 hours: Temp:  [98.3 F (36.8 C)-98.7 F (37.1 C)] 98.4 F (36.9 C) (02/18 0525) Pulse Rate:  [83-104] 86 (02/18 0525) Resp:  [16-18] 18 (02/18 0525) BP: (130-157)/(67-86) 130/72 mmHg (02/18 0525) SpO2:  [88 %-99 %] 94 % (02/18 0525) Last BM Date: 02/23/15  Intake/Output from previous day: 02/17 0701 - 02/18 0700 In: 120 [P.O.:120] Out: 800 [Urine:800] Intake/Output this shift:    Alert, nad, nontoxic cta b/l Reg Soft, obese, min to NT. +BS No edema  Lab Results:  No results for input(s): WBC, HGB, HCT, PLT in the last 72 hours. BMET  Recent Labs  02/24/15 0512  NA 138  K 3.8  CL 101  CO2 30  GLUCOSE 104*  BUN 6  CREATININE 0.78  CALCIUM 8.9   PT/INR No results for input(s): LABPROT, INR in the last 72 hours. ABG No results for input(s): PHART, HCO3 in the last 72 hours.  Invalid input(s): PCO2, PO2  Studies/Results: No results found.  Anti-infectives: Anti-infectives    None      Assessment/Plan: pSBO Improving Add protein shakes Adv to soft diet for dinner If tolerates, home in am Cont home meds Chemical vte prophylaxis  [name]. Andrey Campanile, MD, FACS General, Bariatric, & Minimally Invasive Surgery [facility], [place]   LOS: 4 days    [name] 02/24/2015

## 2015-02-25 ENCOUNTER — Other Ambulatory Visit: Payer: Self-pay | Admitting: Internal Medicine

## 2015-02-25 NOTE — Progress Notes (Signed)
Nurse reviewed discharge instructions with pt.  Pt verbalized understanding of discharge instructions and follow up appointments.  No concerns at time of discharge.   

## 2015-02-25 NOTE — Discharge Summary (Signed)
Physician Discharge Summary  Patient ID: [name] MRN: [id number] DOB/AGE: [birth year] 60 y.o.  Admit date: 02/20/2015 Discharge date: 02/25/2015  Admission Diagnoses:  Small bowel obstruction  Discharge Diagnoses:  Active Problems:   SBO (small bowel obstruction) [facility])   Discharged Condition: good  Hospital Course: She was admitted on 02/20/15 and place on bowel rest.  Ng tube was unable to be placed.  She slowly improved and her diet was slowly advanced until she was tolerating a soft diet and able to be discharged on 02/25/15.  She should avoid raw fruits and vegetables and stay on a soft diet as there appears to be a relationship between her eating a large amount of vegetables at once and the obstruction.  Follow up with Dr. Yetta Barre as needed.  C Discharge Exam: Blood pressure 140/76, pulse 85, temperature 98.7 F (37.1 C), temperature source Oral, resp. rate 16, height  (1.6 m), weight 122.29 kg (269 lb 9.6 oz), SpO2 99 %.  Disposition: 01-Home or Self Care     Medication List    STOP taking these medications        azelastine 0.1 % nasal spray  Commonly known as:  ASTELIN     phentermine 37.5 MG capsule      TAKE these medications        ADVIL 200 MG tablet  Generic drug:  ibuprofen  Take 600 mg by mouth every 8 (eight) hours as needed for headache or mild pain.     aspirin 325 MG tablet  Take 325 mg by mouth daily as needed for mild pain, moderate pain or headache.     DULoxetine 30 MG capsule  Commonly known as:  CYMBALTA  TAKE 1 CAPSULE DAILY     montelukast 10 MG tablet  Commonly known as:  SINGULAIR  Take 1 tablet (10 mg total) by mouth at bedtime.     multivitamin,tx-minerals tablet  Take 1 tablet by mouth daily.     potassium chloride SA 20 MEQ tablet  Commonly known as:  KLOR-CON M20  Take 1 tablet (20 mEq total) by mouth once.     PROBIOTIC DAILY PO  Take 1 tablet by mouth daily.     triamterene-hydrochlorothiazide 37.5-25 MG capsule   Commonly known as:  DYAZIDE  TAKE 1 CAPSULE DAILY     Vitamin D (Ergocalciferol) 50000 units Caps capsule  Commonly known as:  DRISDOL  Take 1 capsule (50,000 Units total) by mouth once a week.           Follow-up Information    Follow up with [name], MD.   Specialty:  Internal Medicine   Contact information:   [address] [place] [address] [phone]       Signed: Adolph Pollack 02/25/2015, 9:44 AM

## 2015-02-25 NOTE — Progress Notes (Signed)
  Subjective: Feeling much better. Tolerating soft diet.  Passing gas.  Bowels moving.   Objective: Vital signs in last 24 hours: Temp:  [98.6 F (37 C)-99.1 F (37.3 C)] 98.7 F (37.1 C) (02/19 0528) Pulse Rate:  [78-98] 85 (02/19 0528) Resp:  [16-18] 16 (02/19 0528) BP: (127-140)/(68-76) 140/76 mmHg (02/19 0528) SpO2:  [92 %-99 %] 99 % (02/19 0528) Last BM Date: 02/23/15  Intake/Output from previous day: 02/18 0701 - 02/19 0700 In: 240 [P.O.:240] Out: 1700 [Urine:1700] Intake/Output this shift:    PE: General- In NAD Abdomen-soft, midline scar, not tender  Lab Results:  No results for input(s): WBC, HGB, HCT, PLT in the last 72 hours. BMET  Recent Labs  02/24/15 0512  NA 138  K 3.8  CL 101  CO2 30  GLUCOSE 104*  BUN 6  CREATININE 0.78  CALCIUM 8.9   PT/INR No results for input(s): LABPROT, INR in the last 72 hours. Comprehensive Metabolic Panel:    Component Value Date/Time   NA 138 02/24/2015 0512   NA 141 02/21/2015 0429   K 3.8 02/24/2015 0512   K 4.5 02/21/2015 0429   CL 101 02/24/2015 0512   CL 103 02/21/2015 0429   CO2 30 02/24/2015 0512   CO2 29 02/21/2015 0429   BUN 6 02/24/2015 0512   BUN 13 02/21/2015 0429   CREATININE 0.78 02/24/2015 0512   CREATININE 0.91 02/21/2015 0429   GLUCOSE 104* 02/24/2015 0512   GLUCOSE 118* 02/21/2015 0429   CALCIUM 8.9 02/24/2015 0512   CALCIUM 8.6* 02/21/2015 0429   AST 36 02/20/2015 0856   AST 22 05/04/2012 0836   ALT 31 02/20/2015 0856   ALT 31 05/04/2012 0836   ALKPHOS 122 02/20/2015 0856   ALKPHOS 100 05/04/2012 0836   BILITOT 0.6 02/20/2015 0856   BILITOT 0.6 05/04/2012 0836   PROT 8.9* 02/20/2015 0856   PROT 7.7 05/04/2012 0836   ALBUMIN 4.7 02/20/2015 0856   ALBUMIN 3.8 05/04/2012 0836     Studies/Results: No results found.  Anti-infectives: Anti-infectives    None      Assessment Active Problems:   SBO (small bowel obstruction) (HCC)-resolved; her last two episodes seem to be  related to eating a lot of vegetables at once.    LOS: 5 days   Plan: Discharge today.  Soft diet.  Follow up with PCP as needed.   Raiford Fetterman J 02/25/2015

## 2015-02-25 NOTE — Discharge Instructions (Signed)
Avoid raw fruits and vegetables as well discussed.  Be careful with cooked vegetables that have a rind.  Soft diet.  Follow up with your PCP as needed.

## 2015-02-26 NOTE — Telephone Encounter (Signed)
1 mo supply until OV Hopsital f/u may be scheduled. Refill will then be submitted.

## 2015-03-04 ENCOUNTER — Other Ambulatory Visit: Payer: Self-pay | Admitting: Internal Medicine

## 2015-04-26 ENCOUNTER — Ambulatory Visit (INDEPENDENT_AMBULATORY_CARE_PROVIDER_SITE_OTHER): Payer: PRIVATE HEALTH INSURANCE | Admitting: Internal Medicine

## 2015-04-26 ENCOUNTER — Encounter: Payer: Self-pay | Admitting: Internal Medicine

## 2015-04-26 ENCOUNTER — Other Ambulatory Visit (INDEPENDENT_AMBULATORY_CARE_PROVIDER_SITE_OTHER): Payer: PRIVATE HEALTH INSURANCE

## 2015-04-26 VITALS — BP 124/80 | HR 89 | Temp 98.6°F | Resp 16 | Ht 63.0 in | Wt 271.0 lb

## 2015-04-26 DIAGNOSIS — G2581 Restless legs syndrome: Secondary | ICD-10-CM

## 2015-04-26 DIAGNOSIS — I10 Essential (primary) hypertension: Secondary | ICD-10-CM

## 2015-04-26 DIAGNOSIS — Z1211 Encounter for screening for malignant neoplasm of colon: Secondary | ICD-10-CM

## 2015-04-26 DIAGNOSIS — E559 Vitamin D deficiency, unspecified: Secondary | ICD-10-CM

## 2015-04-26 DIAGNOSIS — R739 Hyperglycemia, unspecified: Secondary | ICD-10-CM

## 2015-04-26 DIAGNOSIS — K2100 Gastro-esophageal reflux disease with esophagitis, without bleeding: Secondary | ICD-10-CM

## 2015-04-26 DIAGNOSIS — E785 Hyperlipidemia, unspecified: Secondary | ICD-10-CM

## 2015-04-26 DIAGNOSIS — R072 Precordial pain: Secondary | ICD-10-CM

## 2015-04-26 DIAGNOSIS — F418 Other specified anxiety disorders: Secondary | ICD-10-CM

## 2015-04-26 DIAGNOSIS — M170 Bilateral primary osteoarthritis of knee: Secondary | ICD-10-CM | POA: Insufficient documentation

## 2015-04-26 LAB — CBC WITH DIFFERENTIAL/PLATELET
Basophils Absolute: 0 10*3/uL (ref 0.0–0.1)
Basophils Relative: 0.3 % (ref 0.0–3.0)
Eosinophils Absolute: 0.2 10*3/uL (ref 0.0–0.7)
Eosinophils Relative: 1.9 % (ref 0.0–5.0)
HCT: 35.7 % — ABNORMAL LOW (ref 36.0–46.0)
Hemoglobin: 11.7 g/dL — ABNORMAL LOW (ref 12.0–15.0)
Lymphocytes Relative: 23.7 % (ref 12.0–46.0)
Lymphs Abs: 2.4 10*3/uL (ref 0.7–4.0)
MCHC: 32.8 g/dL (ref 30.0–36.0)
MCV: 86.5 fl (ref 78.0–100.0)
Monocytes Absolute: 0.5 10*3/uL (ref 0.1–1.0)
Monocytes Relative: 5.1 % (ref 3.0–12.0)
Neutro Abs: 7 10*3/uL (ref 1.4–7.7)
Neutrophils Relative %: 69 % (ref 43.0–77.0)
Platelets: 365 10*3/uL (ref 150.0–400.0)
RBC: 4.12 Mil/uL (ref 3.87–5.11)
RDW: 15.3 % (ref 11.5–15.5)
WBC: 10.2 10*3/uL (ref 4.0–10.5)

## 2015-04-26 LAB — COMPREHENSIVE METABOLIC PANEL
ALT: 24 U/L (ref 0–35)
AST: 15 U/L (ref 0–37)
Albumin: 4.1 g/dL (ref 3.5–5.2)
Alkaline Phosphatase: 109 U/L (ref 39–117)
BUN: 13 mg/dL (ref 6–23)
CO2: 31 mEq/L (ref 19–32)
Calcium: 9.8 mg/dL (ref 8.4–10.5)
Chloride: 101 mEq/L (ref 96–112)
Creatinine, Ser: 0.76 mg/dL (ref 0.40–1.20)
GFR: 99.97 mL/min (ref >60.00)
Glucose, Bld: 95 mg/dL (ref 70–99)
Potassium: 3.6 mEq/L (ref 3.5–5.1)
Sodium: 140 mEq/L (ref 135–145)
Total Bilirubin: 0.3 mg/dL (ref 0.2–1.2)
Total Protein: 7.8 g/dL (ref 6.0–8.3)

## 2015-04-26 LAB — LIPID PANEL
Cholesterol: 187 mg/dL (ref 0–200)
HDL: 50.3 mg/dL (ref >39.00)
LDL Cholesterol: 111 mg/dL — ABNORMAL HIGH (ref 0–99)
NonHDL: 137.07
Total CHOL/HDL Ratio: 4
Triglycerides: 129 mg/dL (ref 0.0–149.0)
VLDL: 25.8 mg/dL (ref 0.0–40.0)

## 2015-04-26 LAB — HEMOGLOBIN A1C: Hgb A1c MFr Bld: 6 % (ref 4.6–6.5)

## 2015-04-26 LAB — MAGNESIUM: Magnesium: 1.8 mg/dL (ref 1.5–2.5)

## 2015-04-26 LAB — TSH: TSH: 1.37 u[IU]/mL (ref 0.35–4.50)

## 2015-04-26 MED ORDER — TRIAMTERENE-HCTZ 37.5-25 MG PO CAPS: 1.0000 | ORAL_CAPSULE | Freq: Every day | ORAL | Status: DC

## 2015-04-26 MED ORDER — PREGABALIN 75 MG PO CAPS: 75.0000 mg | ORAL_CAPSULE | Freq: Every day | ORAL | Status: DC

## 2015-04-26 MED ORDER — VITAMIN D (ERGOCALCIFEROL) 1.25 MG (50000 UNIT) PO CAPS: ORAL_CAPSULE | ORAL | Status: DC

## 2015-04-26 MED ORDER — DULOXETINE HCL 30 MG PO CPEP: ORAL_CAPSULE | ORAL | Status: DC

## 2015-04-26 NOTE — Progress Notes (Signed)
Pre visit review using our clinic review tool, if applicable. No additional management support is needed unless otherwise documented below in the visit note. 

## 2015-04-26 NOTE — Patient Instructions (Signed)
Nonspecific Chest Pain  °Chest pain can be caused by many different conditions. There is always a chance that your pain could be related to something serious, such as a heart attack or a blood clot in your lungs. Chest pain can also be caused by conditions that are not life-threatening. If you have chest pain, it is very important to follow up with your health care provider. °CAUSES  °Chest pain can be caused by: °· Heartburn. °· Pneumonia or bronchitis. °· Anxiety or stress. °· Inflammation around your heart (pericarditis) or lung (pleuritis or pleurisy). °· A blood clot in your lung. °· A collapsed lung (pneumothorax). It can develop suddenly on its own (spontaneous pneumothorax) or from trauma to the chest. °· Shingles infection (varicella-zoster virus). °· Heart attack. °· Damage to the bones, muscles, and cartilage that make up your chest wall. This can include: °¨ Bruised bones due to injury. °¨ Strained muscles or cartilage due to frequent or repeated coughing or overwork. °¨ Fracture to one or more ribs. °¨ Sore cartilage due to inflammation (costochondritis). °RISK FACTORS  °Risk factors for chest pain may include: °· Activities that increase your risk for trauma or injury to your chest. °· Respiratory infections or conditions that cause frequent coughing. °· Medical conditions or overeating that can cause heartburn. °· Heart disease or family history of heart disease. °· Conditions or health behaviors that increase your risk of developing a blood clot. °· Having had chicken pox (varicella zoster). °SIGNS AND SYMPTOMS °Chest pain can feel like: °· Burning or tingling on the surface of your chest or deep in your chest. °· Crushing, pressure, aching, or squeezing pain. °· Dull or sharp pain that is worse when you move, cough, or take a deep breath. °· Pain that is also felt in your back, neck, shoulder, or arm, or pain that spreads to any of these areas. °Your chest pain may come and go, or it may stay  constant. °DIAGNOSIS °Lab tests or other studies may be needed to find the cause of your pain. Your health care provider may have you take a test called an ambulatory ECG (electrocardiogram). An ECG records your heartbeat patterns at the time the test is performed. You may also have other tests, such as: °· Transthoracic echocardiogram (TTE). During echocardiography, sound waves are used to create a picture of all of the heart structures and to look at how blood flows through your heart. °· Transesophageal echocardiogram (TEE). This is a more advanced imaging test that obtains images from inside your body. It allows your health care provider to see your heart in finer detail. °· Cardiac monitoring. This allows your health care provider to monitor your heart rate and rhythm in real time. °· Holter monitor. This is a portable device that records your heartbeat and can help to diagnose abnormal heartbeats. It allows your health care provider to track your heart activity for several days, if needed. °· Stress tests. These can be done through exercise or by taking medicine that makes your heart beat more quickly. °· Blood tests. °· Imaging tests. °TREATMENT  °Your treatment depends on what is causing your chest pain. Treatment may include: °· Medicines. These may include: °¨ Acid blockers for heartburn. °¨ Anti-inflammatory medicine. °¨ Pain medicine for inflammatory conditions. °¨ Antibiotic medicine, if an infection is present. °¨ Medicines to dissolve blood clots. °¨ Medicines to treat coronary artery disease. °· Supportive care for conditions that do not require medicines. This may include: °¨ Resting. °¨ Applying heat   or cold packs to injured areas. °¨ Limiting activities until pain decreases. °HOME CARE INSTRUCTIONS °· If you were prescribed an antibiotic medicine, finish it all even if you start to feel better. °· Avoid any activities that bring on chest pain. °· Do not use any tobacco products, including  cigarettes, chewing tobacco, or electronic cigarettes. If you need help quitting, ask your health care provider. °· Do not drink alcohol. °· Take medicines only as directed by your health care provider. °· Keep all follow-up visits as directed by your health care provider. This is important. This includes any further testing if your chest pain does not go away. °· If heartburn is the cause for your chest pain, you may be told to keep your head raised (elevated) while sleeping. This reduces the chance that acid will go from your stomach into your esophagus. °· Make lifestyle changes as directed by your health care provider. These may include: °¨ Getting regular exercise. Ask your health care provider to suggest some activities that are safe for you. °¨ Eating a heart-healthy diet. A registered dietitian can help you to learn healthy eating options. °¨ Maintaining a healthy weight. °¨ Managing diabetes, if necessary. °¨ Reducing stress. °SEEK MEDICAL CARE IF: °· Your chest pain does not go away after treatment. °· You have a rash with blisters on your chest. °· You have a fever. °SEEK IMMEDIATE MEDICAL CARE IF:  °· Your chest pain is worse. °· You have an increasing cough, or you cough up blood. °· You have severe abdominal pain. °· You have severe weakness. °· You faint. °· You have chills. °· You have sudden, unexplained chest discomfort. °· You have sudden, unexplained discomfort in your arms, back, neck, or jaw. °· You have shortness of breath at any time. °· You suddenly start to sweat, or your skin gets clammy. °· You feel nauseous or you vomit. °· You suddenly feel light-headed or dizzy. °· Your heart begins to beat quickly, or it feels like it is skipping beats. °These symptoms may represent a serious problem that is an emergency. Do not wait to see if the symptoms will go away. Get medical help right away. Call your local emergency services (911 in the [place] Do not drive yourself to the hospital. °  °This  information is not intended to replace advice given to you by your health care provider. Make sure you discuss any questions you have with your health care provider. °  °Document Released: 10/02/2004 Document Revised: 01/13/2014 Document Reviewed: 07/29/2013 °[employer]vier Interactive Patient Education ©2016 [employer] ° °

## 2015-04-26 NOTE — Progress Notes (Signed)
Subjective:  Patient ID: [name], female    DOB: 22-May-1955  Age: 60 y.o. MRN: [id number]  CC: Hypertension; Hyperlipidemia; Chest Pain; and Osteoarthritis   HPI [name] presents for a blood pressure check and review of several symptoms.  She complains of an irritated feeling in both knees and tells me that when she tries to lay down or go to sleep at night she feels cramps and the urge to move and twitch her lower extremities. She knows that she has arthritis in both knees but she doesn't think her current symptoms are completely attributable to arthritis.  She also complains of intermittent episodes of chest pain for about 4 months. The pain is located diffusely over her anterior chest in the midline and does not radiate. She states the chest pain is precipitated by emotional stress and always occurs at rest. She does not complain of dyspnea on exertion, palpitations, fatigue, syncope, edema, or diaphoresis.    Outpatient Prescriptions Prior to Visit  Medication Sig Dispense Refill  . aspirin 325 MG tablet Take 325 mg by mouth daily as needed for mild pain, moderate pain or headache.    . montelukast (SINGULAIR) 10 MG tablet Take 1 tablet (10 mg total) by mouth at bedtime. 90 tablet 3  . Multiple Vitamins-Minerals (MULTIVITAMIN,TX-MINERALS) tablet Take 1 tablet by mouth daily.      . potassium chloride SA (K-DUR,KLOR-CON) 20 MEQ tablet Take 1 tablet (20 mEq total) by mouth daily. Annual due for additional refills. 90 tablet 0  . Probiotic Product (PROBIOTIC DAILY PO) Take 1 tablet by mouth daily.    . DULoxetine (CYMBALTA) 30 MG capsule TAKE 1 CAPSULE DAILY (Patient taking differently: TAKE 30 MG BY MOUTH  DAILY) 90 capsule 3  . ibuprofen (ADVIL) 200 MG tablet Take 600 mg by mouth every 8 (eight) hours as needed for headache or mild pain.  30 tablet 1  . triamterene-hydrochlorothiazide (DYAZIDE) 37.5-25 MG per capsule TAKE 1 CAPSULE DAILY 90 capsule 2  . Vitamin D,  Ergocalciferol, (DRISDOL) 50000 units CAPS capsule TAKE 1 CAPSULE ONCE EVERY WEEK (YEARLY PHYSICAL DUE IN JANUARY 2017 MUST SEE MD FOR FUTURE REFILLS) 4 capsule 0   No facility-administered medications prior to visit.    ROS Review of Systems  Constitutional: Negative.  Negative for fever, chills, diaphoresis, appetite change and fatigue.  HENT: Negative.   Eyes: Negative.   Respiratory: Negative.  Negative for cough, choking, chest tightness, shortness of breath, wheezing and stridor.   Cardiovascular: Negative.  Negative for chest pain, palpitations and leg swelling.  Gastrointestinal: Negative.  Negative for nausea, vomiting, abdominal pain, diarrhea, constipation and blood in stool.  Endocrine: Negative.   Genitourinary: Negative.   Musculoskeletal: Positive for arthralgias. Negative for myalgias, back pain, joint swelling and neck pain.  Skin: Negative.  Negative for color change and rash.  Allergic/Immunologic: Negative.   Neurological: Negative.   Hematological: Negative.  Negative for adenopathy. Does not bruise/bleed easily.  Psychiatric/Behavioral: Negative.     Objective:  BP 124/80 mmHg  Pulse 89  Temp(Src) 98.6 F (37 C) (Oral)  Resp 16  Ht 5\' 3"  (1.6 m)  Wt 271 lb (122.925 kg)  BMI 48.02 kg/m2  SpO2 93%  BP Readings from Last 3 Encounters:  04/26/15 124/80  02/25/15 140/76  04/22/14 112/74    Wt Readings from Last 3 Encounters:  04/26/15 271 lb (122.925 kg)  02/21/15 269 lb 9.6 oz (122.29 kg)  04/22/14 266 lb (120.657 kg)    Physical Exam  Constitutional: She is oriented to person, place, and time. No distress.  HENT:  Head: Normocephalic and atraumatic.  Mouth/Throat: Oropharynx is clear and moist. No oropharyngeal exudate.  Eyes: Conjunctivae are normal. Right eye exhibits no discharge. Left eye exhibits no discharge. No scleral icterus.  Neck: Normal range of motion. Neck supple. No JVD present. No tracheal deviation present. No thyromegaly  present.  Cardiovascular: Normal rate, regular rhythm, normal heart sounds and intact distal pulses.  Exam reveals no gallop and no friction rub.   No murmur heard. Pulses:      Carotid pulses are 1+ on the right side, and 1+ on the left side.      Radial pulses are 1+ on the right side, and 1+ on the left side.       Femoral pulses are 1+ on the right side, and 1+ on the left side.      Popliteal pulses are 1+ on the right side, and 1+ on the left side.       Dorsalis pedis pulses are 1+ on the right side, and 1+ on the left side.       Posterior tibial pulses are 1+ on the right side, and 1+ on the left side.  EKG ---  Sinus  Rhythm  Low voltage in precordial leads.   -RSR(V1) -nondiagnostic.   -  Diffuse nonspecific T-abnormality.   ABNORMAL   Pulmonary/Chest: Effort normal and breath sounds normal. No stridor. No respiratory distress. She has no wheezes. She has no rales. She exhibits no tenderness.  Abdominal: Soft. Bowel sounds are normal. She exhibits no distension and no mass. There is no tenderness. There is no rebound and no guarding.  Musculoskeletal: Normal range of motion. She exhibits no edema or tenderness.       Right knee: She exhibits deformity (DJD). She exhibits normal range of motion, no swelling, no effusion, no ecchymosis, no erythema, normal alignment and no bony tenderness. No tenderness found.       Left knee: She exhibits deformity (DJD). She exhibits normal range of motion, no swelling, no effusion, no ecchymosis, no laceration, no erythema, normal alignment, no LCL laxity and no bony tenderness. No tenderness found.  Lymphadenopathy:    She has no cervical adenopathy.  Neurological: She is oriented to person, place, and time.  Skin: Skin is warm and dry. No rash noted. She is not diaphoretic. No erythema. No pallor.  Psychiatric: She has a normal mood and affect. Her behavior is normal. Judgment and thought content normal.  Vitals reviewed.   Lab Results    Component Value Date   WBC 10.2 04/26/2015   HGB 11.7* 04/26/2015   HCT 35.7* 04/26/2015   PLT 365.0 04/26/2015   GLUCOSE 95 04/26/2015   CHOL 187 04/26/2015   TRIG 129.0 04/26/2015   HDL 50.30 04/26/2015   LDLCALC 111* 04/26/2015   ALT 24 04/26/2015   AST 15 04/26/2015   NA 140 04/26/2015   K 3.6 04/26/2015   CL 101 04/26/2015   CREATININE 0.76 04/26/2015   BUN 13 04/26/2015   CO2 31 04/26/2015   TSH 1.37 04/26/2015   HGBA1C 6.0 04/26/2015    Ct Abdomen Pelvis W Contrast  02/20/2015  CLINICAL DATA:  Left upper quadrant abdomen pain and nausea starting last night. EXAM: CT ABDOMEN AND PELVIS WITH CONTRAST TECHNIQUE: Multidetector CT imaging of the abdomen and pelvis was performed using the standard protocol following bolus administration of intravenous contrast. CONTRAST:  OMNIPAQUE IOHEXOL 300 MG/ML  SOLN COMPARISON:  October 15, 2004 FINDINGS: Patient status post prior cholecystectomy. There is postsurgical intra and extrahepatic biliary ductal dilatation with common duct bile duct measuring 1.5 cm. The liver is otherwise normal. No focal liver lesion is identified. The spleen, pancreas are normal. The adrenal glands are normal bilaterally. Bilateral parapelvic renal cysts are identified. There is no hydronephrosis bilaterally. There is a tiny cyst in the upper pole of right kidney. There is minimal atherosclerosis of the aorta. There is no aneurysmal dilatation. There is no abdominal lymphadenopathy. There is radiopaque banding around the GE junction. There are multiple dilated small bowel loops with transition probably in the distal jejunum or proximal ileum. The distal ileum loops are normal in caliber. There is diverticulosis of colon without diverticulitis. The patient is status post prior appendectomy. Partial fluid-filled bladder is normal. The patient is status post prior hysterectomy. There is mild dependent atelectasis of the lung bases. Degenerative joint changes of the  spine are identified. IMPRESSION: Dilated small bowel loops with transition probably in the distal jejunum or proximal ileum. The distal ileal loops are normal in caliber. The findings are consistent with early small bowel obstruction. Status post cholecystectomy with postsurgical intra and extrahepatic biliary ductal dilatation. Electronically Signed   By: Sherian Rein M.D.   On: 02/20/2015 14:29   Dg Abd 2 Views  02/21/2015  CLINICAL DATA:  Followup small bowel obstruction. Persistent diffuse abdominal pain. EXAM: ABDOMEN - 2 VIEW COMPARISON:  CT 02/20/2015. FINDINGS: Lap band remains visible with a normal orientation. Surgical clips in the right upper quadrant of the abdomen. Persistent small bowel obstruction pattern with dilated fluid and air-filled loops of small intestine. No free air. IMPRESSION: Persistent small bowel obstruction pattern.  No free air. Electronically Signed   By: Paulina Fusi M.D.   On: 02/21/2015 08:13    Assessment & Plan:   [name] was seen today for hypertension, hyperlipidemia, chest pain and osteoarthritis.  Diagnoses and all orders for this visit:  Essential hypertension- her blood pressure is well-controlled, electrolytes and renal function are stable. -     triamterene-hydrochlorothiazide (DYAZIDE) 37.5-25 MG capsule; Take 1 each (1 capsule total) by mouth daily. -     Comprehensive metabolic panel; Future -     Magnesium; Future -     EKG 12-Lead  Hyperglycemia- her A1c is 6.0%, she is prediabetic and agrees to work on her lifestyle modifications, no medications are needed at this time. -     Comprehensive metabolic panel; Future -     Hemoglobin A1c; Future  Hyperlipidemia with target LDL less than 130- her Framingham risk score is only 2% so I do not recommend that she take a statin -     Lipid panel; Future -     TSH; Future  Gastroesophageal reflux disease with esophagitis- her symptoms are well controlled -     CBC with Differential/Platelet;  Future  Vitamin D deficiency -     Vitamin D, Ergocalciferol, (DRISDOL) 50000 units CAPS capsule; TAKE 1 CAPSULE ONCE EVERY WEEK  Depression with anxiety -     DULoxetine (CYMBALTA) 30 MG capsule; TAKE 30 MG BY MOUTH  DAILY  Primary osteoarthritis of both knees- I've asked her to follow-up with sports medicine for further evaluation and treatment of the discomfort in her knees -     Ambulatory referral to Sports Medicine  Restless legs syndrome (RLS)- I'm suspicious that her lower extremity symptoms may be related to restless leg syndrome so I've asked  her to try a course of Lyrica for symptom relief. -     pregabalin (LYRICA) 75 MG capsule; Take 1 capsule (75 mg total) by mouth at bedtime.  Screen for colon cancer -     Ambulatory referral to Gastroenterology  Precordial chest pain- her chest pain is not suspicious for angina, her EKG has some changes but it is consistent with her obese body habitus and large breasts, she will work on stress reduction modalities and will continue to follow this in future visits.   I have discontinued Ms. Spieker's ibuprofen. I have also changed her Vitamin D (Ergocalciferol), DULoxetine, and triamterene-hydrochlorothiazide. Additionally, I am having her start on pregabalin. Lastly, I am having her maintain her (multivitamin,tx-minerals), montelukast, aspirin, Probiotic Product (PROBIOTIC DAILY PO), and potassium chloride SA.  Meds ordered this encounter  Medications  . Vitamin D, Ergocalciferol, (DRISDOL) 50000 units CAPS capsule    Sig: TAKE 1 CAPSULE ONCE EVERY WEEK    Dispense:  4 capsule    Refill:  4  . DULoxetine (CYMBALTA) 30 MG capsule    Sig: TAKE 30 MG BY MOUTH  DAILY    Dispense:  90 capsule    Refill:  3  . triamterene-hydrochlorothiazide (DYAZIDE) 37.5-25 MG capsule    Sig: Take 1 each (1 capsule total) by mouth daily.    Dispense:  90 capsule    Refill:  2  . pregabalin (LYRICA) 75 MG capsule    Sig: Take 1 capsule (75 mg total) by  mouth at bedtime.    Dispense:  56 capsule    Refill:  0     Follow-up: Return in about 2 months (around 06/26/2015).  Sanda Lingerhomas Jones, MD

## 2015-04-29 DIAGNOSIS — R072 Precordial pain: Secondary | ICD-10-CM | POA: Insufficient documentation

## 2015-05-06 ENCOUNTER — Other Ambulatory Visit: Payer: Self-pay | Admitting: Internal Medicine

## 2015-06-01 ENCOUNTER — Other Ambulatory Visit: Payer: Self-pay | Admitting: Internal Medicine

## 2015-06-07 ENCOUNTER — Other Ambulatory Visit: Payer: Self-pay | Admitting: *Deleted

## 2015-06-07 DIAGNOSIS — E559 Vitamin D deficiency, unspecified: Secondary | ICD-10-CM

## 2015-06-07 MED ORDER — VITAMIN D (ERGOCALCIFEROL) 1.25 MG (50000 UNIT) PO CAPS: ORAL_CAPSULE | ORAL | Status: DC

## 2015-06-07 NOTE — Telephone Encounter (Signed)
Left msg on triage stating she gets her medicine from [facility] and they are only sending $ 4 on her vitamin D. Requesting updated script for 90 day. Called pt back inform new rx has been sent...[name]/lmb

## 2015-06-22 ENCOUNTER — Encounter: Payer: Self-pay | Admitting: Internal Medicine

## 2015-06-27 ENCOUNTER — Ambulatory Visit (INDEPENDENT_AMBULATORY_CARE_PROVIDER_SITE_OTHER): Payer: PRIVATE HEALTH INSURANCE | Admitting: Internal Medicine

## 2015-06-27 ENCOUNTER — Encounter: Payer: Self-pay | Admitting: Internal Medicine

## 2015-06-27 VITALS — BP 120/80 | HR 80 | Temp 98.4°F | Resp 16 | Ht 63.0 in | Wt 269.0 lb

## 2015-06-27 DIAGNOSIS — I10 Essential (primary) hypertension: Secondary | ICD-10-CM

## 2015-06-27 DIAGNOSIS — J4520 Mild intermittent asthma, uncomplicated: Secondary | ICD-10-CM | POA: Insufficient documentation

## 2015-06-27 DIAGNOSIS — J301 Allergic rhinitis due to pollen: Secondary | ICD-10-CM | POA: Diagnosis not present

## 2015-06-27 DIAGNOSIS — J4521 Mild intermittent asthma with (acute) exacerbation: Secondary | ICD-10-CM | POA: Diagnosis not present

## 2015-06-27 DIAGNOSIS — B9789 Other viral agents as the cause of diseases classified elsewhere: Secondary | ICD-10-CM

## 2015-06-27 DIAGNOSIS — J069 Acute upper respiratory infection, unspecified: Secondary | ICD-10-CM | POA: Insufficient documentation

## 2015-06-27 MED ORDER — MOMETASONE FUROATE 50 MCG/ACT NA SUSP: 4.0000 | Freq: Every day | NASAL | Status: DC

## 2015-06-27 MED ORDER — FLUTICASONE FUROATE-VILANTEROL 200-25 MCG/INH IN AEPB: 1.0000 | INHALATION_SPRAY | Freq: Every day | RESPIRATORY_TRACT | Status: DC

## 2015-06-27 MED ORDER — HYDROCOD POLST-CPM POLST ER 10-8 MG/5ML PO SUER: 5.0000 mL | Freq: Two times a day (BID) | ORAL | Status: DC | PRN

## 2015-06-27 NOTE — Progress Notes (Signed)
Pre visit review using our clinic review tool, if applicable. No additional management support is needed unless otherwise documented below in the visit note. 

## 2015-06-27 NOTE — Progress Notes (Signed)
Subjective:  Patient ID: [name], female    DOB: 12/17/1955  Age: 60 y.o. MRN: [id number]  CC: Cough and Allergic Rhinitis    HPI [name] presents for a cough productive of scant amount of white phlegm for 10 days. She tells me that about a week and a half ago she was seen at an urgent care center and was prescribed Zithromax, prednisone, and a cough syrup. She states that her symptoms have improved somewhat but she complains of persistent cough, mostly at night with faint wheezing. She says the cough is so severe that it can cause urinary incontinence. She says the cough medicine and steroids helped but she has run out of the cough medicine. She tells me that she has a prior history of asthma but has not used inhalers for several years.  Outpatient Prescriptions Prior to Visit  Medication Sig Dispense Refill  . aspirin 325 MG tablet Take 325 mg by mouth daily as needed for mild pain, moderate pain or headache.    . DULoxetine (CYMBALTA) 30 MG capsule TAKE 30 MG BY MOUTH  DAILY 90 capsule 3  . montelukast (SINGULAIR) 10 MG tablet TAKE 1 TABLET (10 MG TOTAL) BY MOUTH AT BEDTIME. 90 tablet 2  . Multiple Vitamins-Minerals (MULTIVITAMIN,TX-MINERALS) tablet Take 1 tablet by mouth daily.      . potassium chloride SA (K-DUR,KLOR-CON) 20 MEQ tablet Take 1 tablet (20 mEq total) by mouth daily. 90 tablet 1  . pregabalin (LYRICA) 75 MG capsule Take 1 capsule (75 mg total) by mouth at bedtime. 56 capsule 0  . Probiotic Product (PROBIOTIC DAILY PO) Take 1 tablet by mouth daily.    Marland Kitchen. triamterene-hydrochlorothiazide (DYAZIDE) 37.5-25 MG capsule Take 1 each (1 capsule total) by mouth daily. 90 capsule 2  . Vitamin D, Ergocalciferol, (DRISDOL) 50000 units CAPS capsule TAKE 1 CAPSULE ONCE EVERY WEEK 12 capsule 2   No facility-administered medications prior to visit.    ROS Review of Systems  Constitutional: Negative.  Negative for fever, chills, diaphoresis, appetite change and fatigue.  HENT:  Positive for congestion, postnasal drip and rhinorrhea. Negative for sinus pressure, sneezing, sore throat, tinnitus and trouble swallowing.   Eyes: Negative.   Respiratory: Positive for cough and wheezing. Negative for choking, chest tightness, shortness of breath and stridor.   Cardiovascular: Negative.  Negative for chest pain, palpitations and leg swelling.  Gastrointestinal: Positive for vomiting. Negative for nausea, abdominal pain, diarrhea and constipation.  Endocrine: Negative.   Genitourinary: Negative.   Musculoskeletal: Negative.  Negative for myalgias, back pain, joint swelling and arthralgias.  Skin: Negative.  Negative for color change and rash.  Allergic/Immunologic: Negative.   Neurological: Negative.   Hematological: Negative.  Negative for adenopathy. Does not bruise/bleed easily.  Psychiatric/Behavioral: Negative.     Objective:  BP 120/80 mmHg  Pulse 80  Temp(Src) 98.4 F (36.9 C) (Oral)  Resp 16  Ht 5\' 3"  (1.6 m)  Wt 269 lb (122.018 kg)  BMI 47.66 kg/m2  SpO2 93%  BP Readings from Last 3 Encounters:  06/27/15 120/80  04/26/15 124/80  02/25/15 140/76    Wt Readings from Last 3 Encounters:  06/27/15 269 lb (122.018 kg)  04/26/15 271 lb (122.925 kg)  02/21/15 269 lb 9.6 oz (122.29 kg)    Physical Exam  Constitutional: She is oriented to person, place, and time.  Non-toxic appearance. She does not have a sickly appearance. She does not appear ill. No distress.  HENT:  Nose: Mucosal edema and rhinorrhea present.  No sinus tenderness. Right sinus exhibits no maxillary sinus tenderness. Left sinus exhibits no maxillary sinus tenderness.  Eyes: Conjunctivae are normal. Right eye exhibits no discharge. Left eye exhibits no discharge. No scleral icterus.  Neck: Normal range of motion. Neck supple. No JVD present. No tracheal deviation present. No thyromegaly present.  Cardiovascular: Normal rate, regular rhythm, normal heart sounds and intact distal pulses.   Exam reveals no gallop and no friction rub.   No murmur heard. Pulmonary/Chest: Effort normal. No stridor. No respiratory distress. She has decreased breath sounds in the right upper field, the right middle field, the right lower field, the left upper field, the left middle field and the left lower field. She has no wheezes. She has no rhonchi. She has no rales. She exhibits no tenderness.  She has diffusely decreased breath sounds but good air movement  Abdominal: Soft. Bowel sounds are normal. She exhibits no distension and no mass. There is no tenderness. There is no rebound and no guarding.  Musculoskeletal: She exhibits no edema.  Lymphadenopathy:    She has no cervical adenopathy.  Neurological: She is oriented to person, place, and time.  Skin: Skin is warm and dry. No rash noted. She is not diaphoretic. No erythema. No pallor.  Vitals reviewed.   Lab Results  Component Value Date   WBC 10.2 04/26/2015   HGB 11.7* 04/26/2015   HCT 35.7* 04/26/2015   PLT 365.0 04/26/2015   GLUCOSE 95 04/26/2015   CHOL 187 04/26/2015   TRIG 129.0 04/26/2015   HDL 50.30 04/26/2015   LDLCALC 111* 04/26/2015   ALT 24 04/26/2015   AST 15 04/26/2015   NA 140 04/26/2015   K 3.6 04/26/2015   CL 101 04/26/2015   CREATININE 0.76 04/26/2015   BUN 13 04/26/2015   CO2 31 04/26/2015   TSH 1.37 04/26/2015   HGBA1C 6.0 04/26/2015    Ct Abdomen Pelvis W Contrast  02/20/2015  CLINICAL DATA:  Left upper quadrant abdomen pain and nausea starting last night. EXAM: CT ABDOMEN AND PELVIS WITH CONTRAST TECHNIQUE: Multidetector CT imaging of the abdomen and pelvis was performed using the standard protocol following bolus administration of intravenous contrast. CONTRAST:  OMNIPAQUE IOHEXOL 300 MG/ML  SOLN COMPARISON:  October 15, 2004 FINDINGS: Patient status post prior cholecystectomy. There is postsurgical intra and extrahepatic biliary ductal dilatation with common duct bile duct measuring 1.5 cm. The liver  is otherwise normal. No focal liver lesion is identified. The spleen, pancreas are normal. The adrenal glands are normal bilaterally. Bilateral parapelvic renal cysts are identified. There is no hydronephrosis bilaterally. There is a tiny cyst in the upper pole of right kidney. There is minimal atherosclerosis of the aorta. There is no aneurysmal dilatation. There is no abdominal lymphadenopathy. There is radiopaque banding around the GE junction. There are multiple dilated small bowel loops with transition probably in the distal jejunum or proximal ileum. The distal ileum loops are normal in caliber. There is diverticulosis of colon without diverticulitis. The patient is status post prior appendectomy. Partial fluid-filled bladder is normal. The patient is status post prior hysterectomy. There is mild dependent atelectasis of the lung bases. Degenerative joint changes of the spine are identified. IMPRESSION: Dilated small bowel loops with transition probably in the distal jejunum or proximal ileum. The distal ileal loops are normal in caliber. The findings are consistent with early small bowel obstruction. Status post cholecystectomy with postsurgical intra and extrahepatic biliary ductal dilatation. Electronically Signed   By: Scherry Ran  Juel Burrow M.D.   On: 02/20/2015 14:29   Dg Abd 2 Views  02/21/2015  CLINICAL DATA:  Followup small bowel obstruction. Persistent diffuse abdominal pain. EXAM: ABDOMEN - 2 VIEW COMPARISON:  CT 02/20/2015. FINDINGS: Lap band remains visible with a normal orientation. Surgical clips in the right upper quadrant of the abdomen. Persistent small bowel obstruction pattern with dilated fluid and air-filled loops of small intestine. No free air. IMPRESSION: Persistent small bowel obstruction pattern.  No free air. Electronically Signed   By: Paulina Fusi M.D.   On: 02/21/2015 08:13    Assessment & Plan:   Anhelica was seen today for cough and allergic rhinitis .  Diagnoses and all orders  for this visit:  Essential hypertension- her blood pressures adequately well-controlled  Allergic rhinitis due to pollen- will start treating this with Nasonex -     mometasone (NASONEX) 50 MCG/ACT nasal spray; Place 4 sprays into the nose daily.  Asthma, mild intermittent, with acute exacerbation- patient is having a mild exacerbation of asthma, she has are to been treated with systemic steroids so I've asked her to start using a combination of a ICS/LABA. -     fluticasone furoate-vilanterol (BREO ELLIPTA) 200-25 MCG/INH AEPB; Inhale 1 puff into the lungs daily.  Viral URI with cough- I don't think she needs another course of antibiotics but will continue cough suppressant as needed. -     chlorpheniramine-HYDROcodone (TUSSIONEX PENNKINETIC ER) 10-8 MG/5ML SUER; Take 5 mLs by mouth every 12 (twelve) hours as needed for cough.  I am having Ms. Snethen start on mometasone, fluticasone furoate-vilanterol, and chlorpheniramine-HYDROcodone. I am also having her maintain her (multivitamin,tx-minerals), aspirin, Probiotic Product (PROBIOTIC DAILY PO), DULoxetine, triamterene-hydrochlorothiazide, pregabalin, montelukast, potassium chloride SA, and Vitamin D (Ergocalciferol).  Meds ordered this encounter  Medications  . mometasone (NASONEX) 50 MCG/ACT nasal spray    Sig: Place 4 sprays into the nose daily.    Dispense:  17 g    Refill:  11  . fluticasone furoate-vilanterol (BREO ELLIPTA) 200-25 MCG/INH AEPB    Sig: Inhale 1 puff into the lungs daily.    Dispense:  30 each    Refill:  11  . chlorpheniramine-HYDROcodone (TUSSIONEX PENNKINETIC ER) 10-8 MG/5ML SUER    Sig: Take 5 mLs by mouth every 12 (twelve) hours as needed for cough.    Dispense:  140 mL    Refill:  0     Follow-up: No Follow-up on file.  [name], MD

## 2015-06-27 NOTE — Patient Instructions (Signed)

## 2015-06-29 ENCOUNTER — Encounter: Payer: Self-pay | Admitting: Internal Medicine

## 2015-07-20 ENCOUNTER — Encounter: Payer: Self-pay | Admitting: Internal Medicine

## 2015-07-20 ENCOUNTER — Telehealth: Payer: Self-pay | Admitting: Gastroenterology

## 2015-07-20 ENCOUNTER — Ambulatory Visit (INDEPENDENT_AMBULATORY_CARE_PROVIDER_SITE_OTHER): Payer: PRIVATE HEALTH INSURANCE | Admitting: Internal Medicine

## 2015-07-20 VITALS — BP 124/70 | HR 86 | Temp 98.3°F | Resp 16 | Ht 63.0 in | Wt 270.0 lb

## 2015-07-20 DIAGNOSIS — G2581 Restless legs syndrome: Secondary | ICD-10-CM

## 2015-07-20 DIAGNOSIS — I10 Essential (primary) hypertension: Secondary | ICD-10-CM

## 2015-07-20 DIAGNOSIS — R739 Hyperglycemia, unspecified: Secondary | ICD-10-CM | POA: Diagnosis not present

## 2015-07-20 MED ORDER — PREGABALIN 75 MG PO CAPS: 75.0000 mg | ORAL_CAPSULE | Freq: Every day | ORAL | Status: DC

## 2015-07-20 MED ORDER — LORCASERIN HCL ER 20 MG PO TB24: 1.0000 | ORAL_TABLET | Freq: Every day | ORAL | Status: DC

## 2015-07-20 NOTE — Patient Instructions (Signed)
Obesity Obesity is defined as having too much total body fat and a body mass index (BMI) of 30 or more. BMI is an estimate of body fat and is calculated from your height and weight. BMI is typically calculated by your health care provider during regular wellness visits. Obesity happens when you consume more calories than you can burn by exercising or performing daily physical tasks. Prolonged obesity can cause major illnesses or emergencies, such as:  Stroke.  Heart disease.  Diabetes.  Cancer.  Arthritis.  High blood pressure (hypertension).  High cholesterol.  Sleep apnea.  Erectile dysfunction.  Infertility problems. CAUSES   Regularly eating unhealthy foods.  Physical inactivity.  Certain disorders, such as an underactive thyroid (hypothyroidism), Cushing's syndrome, and polycystic ovarian syndrome.  Certain medicines, such as steroids, some depression medicines, and antipsychotics.  Genetics.  Lack of sleep. DIAGNOSIS A health care provider can diagnose obesity after calculating your BMI. Obesity will be diagnosed if your BMI is 30 or higher. There are other methods of measuring obesity levels. Some other methods include measuring your skinfold thickness, your waist circumference, and comparing your hip circumference to your waist circumference. TREATMENT  A healthy treatment program includes some or all of the following:  Long-term dietary changes.  Exercise and physical activity.  Behavioral and lifestyle changes.  Medicine only under the supervision of your health care provider. Medicines may help, but only if they are used with diet and exercise programs. If your BMI is 40 or higher, your health care provider may recommend specialized surgery or programs to help with weight loss. An unhealthy treatment program includes:  Fasting.  Fad diets.  Supplements and drugs. These choices do not succeed in long-term weight control. HOME CARE  INSTRUCTIONS  Exercise and perform physical activity as directed by your health care provider. To increase physical activity, try the following:  Use stairs instead of elevators.  Park farther away from store entrances.  Garden, bike, or walk instead of watching television or using the computer.  Eat healthy, low-calorie foods and drinks on a regular basis. Eat more fruits and vegetables. Use low-calorie cookbooks or take healthy cooking classes.  Limit fast food, sweets, and processed snack foods.  Eat smaller portions.  Keep a daily journal of everything you eat. There are many free websites to help you with this. It may be helpful to measure your foods so you can determine if you are eating the correct portion sizes.  Avoid drinking alcohol. Drink more water and drinks without calories.  Take vitamins and supplements only as recommended by your health care provider.  Weight-loss support groups, registered dietitians, counselors, and stress reduction education can also be very helpful. SEEK IMMEDIATE MEDICAL CARE IF:  You have chest pain or tightness.  You have trouble breathing or feel short of breath.  You have weakness or leg numbness.  You feel confused or have trouble talking.  You have sudden changes in your vision.   This information is not intended to replace advice given to you by your health care provider. Make sure you discuss any questions you have with your health care provider.   Document Released: 01/31/2004 Document Revised: 01/13/2014 Document Reviewed: 01/29/2011 Elsevier Interactive Patient Education 2016 [employer]  

## 2015-07-20 NOTE — Progress Notes (Signed)
Subjective:  Patient ID: [name], female    DOB: 1955-02-18  Age: 60 y.o. MRN: [id number]  CC: Hypertension   HPI [name] presents for follow-up. When I last saw her she had an upper respiratory infection and flare of her asthma. She tells me that it has resolved and she has not had no recent coughing or wheezing.  She wants me to give her a medication to help her lose weight.   When I last saw her she was also treated for restless leg syndrome with a bedtime dose of Lyrica and she says that his helped significantly. She is now sleeping well with no movements or symptoms during the night that wake her up.  Outpatient Prescriptions Prior to Visit  Medication Sig Dispense Refill  . aspirin 325 MG tablet Take 325 mg by mouth daily as needed for mild pain, moderate pain or headache.    . DULoxetine (CYMBALTA) 30 MG capsule TAKE 30 MG BY MOUTH  DAILY 90 capsule 3  . fluticasone furoate-vilanterol (BREO ELLIPTA) 200-25 MCG/INH AEPB Inhale 1 puff into the lungs daily. 30 each 11  . mometasone (NASONEX) 50 MCG/ACT nasal spray Place 4 sprays into the nose daily. 17 g 11  . montelukast (SINGULAIR) 10 MG tablet TAKE 1 TABLET (10 MG TOTAL) BY MOUTH AT BEDTIME. 90 tablet 2  . Multiple Vitamins-Minerals (MULTIVITAMIN,TX-MINERALS) tablet Take 1 tablet by mouth daily.      . potassium chloride SA (K-DUR,KLOR-CON) 20 MEQ tablet Take 1 tablet (20 mEq total) by mouth daily. 90 tablet 1  . triamterene-hydrochlorothiazide (DYAZIDE) 37.5-25 MG capsule Take 1 each (1 capsule total) by mouth daily. 90 capsule 2  . Vitamin D, Ergocalciferol, (DRISDOL) 50000 units CAPS capsule TAKE 1 CAPSULE ONCE EVERY WEEK 12 capsule 2  . pregabalin (LYRICA) 75 MG capsule Take 1 capsule (75 mg total) by mouth at bedtime. 56 capsule 0  . chlorpheniramine-HYDROcodone (TUSSIONEX PENNKINETIC ER) 10-8 MG/5ML SUER Take 5 mLs by mouth every 12 (twelve) hours as needed for cough. 140 mL 0  . Probiotic Product (PROBIOTIC DAILY  PO) Take 1 tablet by mouth daily.     No facility-administered medications prior to visit.    ROS Review of Systems  Constitutional: Negative.  Negative for fever, chills and fatigue.  HENT: Negative.  Negative for congestion.   Eyes: Negative.  Negative for visual disturbance.  Respiratory: Negative.  Negative for cough, choking, chest tightness, shortness of breath and stridor.   Cardiovascular: Negative.  Negative for chest pain, palpitations and leg swelling.  Gastrointestinal: Negative.  Negative for nausea, abdominal pain and diarrhea.  Endocrine: Negative.   Genitourinary: Negative.   Musculoskeletal: Negative.   Skin: Negative.   Allergic/Immunologic: Negative.   Neurological: Negative.  Negative for dizziness and weakness.  Hematological: Negative.  Negative for adenopathy. Does not bruise/bleed easily.  Psychiatric/Behavioral: Negative.     Objective:  BP 124/70 mmHg  Pulse 86  Temp(Src) 98.3 F (36.8 C) (Oral)  Resp 16  Ht 5\' 3"  (1.6 m)  Wt 270 lb (122.471 kg)  BMI 47.84 kg/m2  SpO2 96%  BP Readings from Last 3 Encounters:  07/20/15 124/70  06/27/15 120/80  04/26/15 124/80    Wt Readings from Last 3 Encounters:  07/20/15 270 lb (122.471 kg)  06/27/15 269 lb (122.018 kg)  04/26/15 271 lb (122.925 kg)    Physical Exam  Constitutional: She is oriented to person, place, and time. No distress.  HENT:  Mouth/Throat: Oropharynx is clear and moist. No  oropharyngeal exudate.  Eyes: Conjunctivae are normal. Right eye exhibits no discharge. Left eye exhibits no discharge. No scleral icterus.  Neck: Normal range of motion. Neck supple. No JVD present. No tracheal deviation present. No thyromegaly present.  Cardiovascular: Normal rate, regular rhythm, normal heart sounds and intact distal pulses.  Exam reveals no gallop and no friction rub.   No murmur heard. Pulmonary/Chest: Effort normal and breath sounds normal. No stridor. No respiratory distress. She has no  wheezes. She has no rales. She exhibits no tenderness.  Abdominal: Bowel sounds are normal. She exhibits no distension and no mass. There is no tenderness. There is no rebound and no guarding.  Musculoskeletal: Normal range of motion. She exhibits no edema or tenderness.  Lymphadenopathy:    She has no cervical adenopathy.  Neurological: She is oriented to person, place, and time.  Skin: Skin is warm and dry. No rash noted. She is not diaphoretic. No erythema. No pallor.  Vitals reviewed.   Lab Results  Component Value Date   WBC 10.2 04/26/2015   HGB 11.7* 04/26/2015   HCT 35.7* 04/26/2015   PLT 365.0 04/26/2015   GLUCOSE 95 04/26/2015   CHOL 187 04/26/2015   TRIG 129.0 04/26/2015   HDL 50.30 04/26/2015   LDLCALC 111* 04/26/2015   ALT 24 04/26/2015   AST 15 04/26/2015   NA 140 04/26/2015   K 3.6 04/26/2015   CL 101 04/26/2015   CREATININE 0.76 04/26/2015   BUN 13 04/26/2015   CO2 31 04/26/2015   TSH 1.37 04/26/2015   HGBA1C 6.0 04/26/2015    Ct Abdomen Pelvis W Contrast  02/20/2015  CLINICAL DATA:  Left upper quadrant abdomen pain and nausea starting last night. EXAM: CT ABDOMEN AND PELVIS WITH CONTRAST TECHNIQUE: Multidetector CT imaging of the abdomen and pelvis was performed using the standard protocol following bolus administration of intravenous contrast. CONTRAST:  100mL OMNIPAQUE IOHEXOL 300 MG/ML  SOLN COMPARISON:  October 15, 2004 FINDINGS: Patient status post prior cholecystectomy. There is postsurgical intra and extrahepatic biliary ductal dilatation with common duct bile duct measuring 1.5 cm. The liver is otherwise normal. No focal liver lesion is identified. The spleen, pancreas are normal. The adrenal glands are normal bilaterally. Bilateral parapelvic renal cysts are identified. There is no hydronephrosis bilaterally. There is a tiny cyst in the upper pole of right kidney. There is minimal atherosclerosis of the aorta. There is no aneurysmal dilatation. There is no  abdominal lymphadenopathy. There is radiopaque banding around the GE junction. There are multiple dilated small bowel loops with transition probably in the distal jejunum or proximal ileum. The distal ileum loops are normal in caliber. There is diverticulosis of colon without diverticulitis. The patient is status post prior appendectomy. Partial fluid-filled bladder is normal. The patient is status post prior hysterectomy. There is mild dependent atelectasis of the lung bases. Degenerative joint changes of the spine are identified. IMPRESSION: Dilated small bowel loops with transition probably in the distal jejunum or proximal ileum. The distal ileal loops are normal in caliber. The findings are consistent with early small bowel obstruction. Status post cholecystectomy with postsurgical intra and extrahepatic biliary ductal dilatation. Electronically Signed   By: Sherian ReinWei-Chen  Lin M.D.   On: 02/20/2015 14:29   Dg Abd 2 Views  02/21/2015  CLINICAL DATA:  Followup small bowel obstruction. Persistent diffuse abdominal pain. EXAM: ABDOMEN - 2 VIEW COMPARISON:  CT 02/20/2015. FINDINGS: Lap band remains visible with a normal orientation. Surgical clips in the right upper quadrant  of the abdomen. Persistent small bowel obstruction pattern with dilated fluid and air-filled loops of small intestine. No free air. IMPRESSION: Persistent small bowel obstruction pattern.  No free air. Electronically Signed   By: Paulina Fusi M.D.   On: 02/21/2015 08:13    Assessment & Plan:   Ravan was seen today for hypertension.  Diagnoses and all orders for this visit:  Restless legs syndrome (RLS)- she is doing well on Lyrica, will continue the same dose -     pregabalin (LYRICA) 75 MG capsule; Take 1 capsule (75 mg total) by mouth at bedtime.  Obesity, Class III, BMI 40-49.9 (morbid obesity) (HCC)- in addition to diet and exercise she will start taking Belviq to help decrease her caloric intake -     Lorcaserin HCl ER (BELVIQ  XR) 20 MG TB24; Take 1 tablet by mouth daily.  Essential hypertension- her blood pressures adequately well-controlled  Hyperglycemia- she is prediabetic, will start Belviq to help her lose weight and hopefully lower her blood sugars.   I have discontinued Ms. Bo's Probiotic Product (PROBIOTIC DAILY PO) and chlorpheniramine-HYDROcodone. I am also having her start on Lorcaserin HCl ER. Additionally, I am having her maintain her (multivitamin,tx-minerals), aspirin, DULoxetine, triamterene-hydrochlorothiazide, montelukast, potassium chloride SA, Vitamin D (Ergocalciferol), mometasone, fluticasone furoate-vilanterol, and pregabalin.  Meds ordered this encounter  Medications  . pregabalin (LYRICA) 75 MG capsule    Sig: Take 1 capsule (75 mg total) by mouth at bedtime.    Dispense:  90 capsule    Refill:  1  . Lorcaserin HCl ER (BELVIQ XR) 20 MG TB24    Sig: Take 1 tablet by mouth daily.    Dispense:  30 tablet    Refill:  5     Follow-up: Return in about 3 months (around 10/20/2015).  [name], MD

## 2015-07-20 NOTE — Telephone Encounter (Signed)
Dr. Gessner will you accept? 

## 2015-07-20 NOTE — Telephone Encounter (Signed)
Dr. Stark do you approve of transfer? 

## 2015-07-20 NOTE — Progress Notes (Signed)
Pre visit review using our clinic review tool, if applicable. No additional management support is needed unless otherwise documented below in the visit note. 

## 2015-07-20 NOTE — Telephone Encounter (Signed)
OK 

## 2015-07-23 NOTE — Telephone Encounter (Signed)
ok 

## 2015-07-23 NOTE — Telephone Encounter (Signed)
Left message on machine to call back  

## 2015-10-10 LAB — HM PAP SMEAR

## 2015-10-10 LAB — HM MAMMOGRAPHY: HM Mammogram: NORMAL (ref 0–4)

## 2015-10-15 ENCOUNTER — Telehealth: Payer: Self-pay

## 2015-10-15 DIAGNOSIS — J301 Allergic rhinitis due to pollen: Secondary | ICD-10-CM

## 2015-10-15 MED ORDER — MOMETASONE FUROATE 50 MCG/ACT NA SUSP: 4.0000 | Freq: Every day | NASAL | 3 refills | Status: DC

## 2015-10-15 NOTE — Telephone Encounter (Signed)
Fax rq from [facility] to refill Nasonex. Erx sent.

## 2015-10-23 ENCOUNTER — Other Ambulatory Visit (INDEPENDENT_AMBULATORY_CARE_PROVIDER_SITE_OTHER): Payer: PRIVATE HEALTH INSURANCE

## 2015-10-23 ENCOUNTER — Ambulatory Visit (INDEPENDENT_AMBULATORY_CARE_PROVIDER_SITE_OTHER): Payer: PRIVATE HEALTH INSURANCE | Admitting: Internal Medicine

## 2015-10-23 VITALS — BP 124/74 | HR 102 | Temp 98.3°F | Resp 20 | Wt 269.8 lb

## 2015-10-23 DIAGNOSIS — G4700 Insomnia, unspecified: Secondary | ICD-10-CM | POA: Insufficient documentation

## 2015-10-23 DIAGNOSIS — Z23 Encounter for immunization: Secondary | ICD-10-CM | POA: Diagnosis not present

## 2015-10-23 DIAGNOSIS — D518 Other vitamin B12 deficiency anemias: Secondary | ICD-10-CM

## 2015-10-23 DIAGNOSIS — D519 Vitamin B12 deficiency anemia, unspecified: Secondary | ICD-10-CM | POA: Insufficient documentation

## 2015-10-23 DIAGNOSIS — D508 Other iron deficiency anemias: Secondary | ICD-10-CM | POA: Diagnosis not present

## 2015-10-23 DIAGNOSIS — I10 Essential (primary) hypertension: Secondary | ICD-10-CM | POA: Diagnosis not present

## 2015-10-23 DIAGNOSIS — D539 Nutritional anemia, unspecified: Secondary | ICD-10-CM

## 2015-10-23 DIAGNOSIS — Z1211 Encounter for screening for malignant neoplasm of colon: Secondary | ICD-10-CM

## 2015-10-23 DIAGNOSIS — G4730 Sleep apnea, unspecified: Secondary | ICD-10-CM

## 2015-10-23 LAB — CBC WITH DIFFERENTIAL/PLATELET
Basophils Absolute: 0.1 10*3/uL (ref 0.0–0.1)
Basophils Relative: 0.6 % (ref 0.0–3.0)
Eosinophils Absolute: 0.2 10*3/uL (ref 0.0–0.7)
Eosinophils Relative: 1.8 % (ref 0.0–5.0)
HCT: 35.4 % — ABNORMAL LOW (ref 36.0–46.0)
Hemoglobin: 11.5 g/dL — ABNORMAL LOW (ref 12.0–15.0)
Lymphocytes Relative: 22.5 % (ref 12.0–46.0)
Lymphs Abs: 2 10*3/uL (ref 0.7–4.0)
MCHC: 32.4 g/dL (ref 30.0–36.0)
MCV: 82 fl (ref 78.0–100.0)
Monocytes Absolute: 0.5 10*3/uL (ref 0.1–1.0)
Monocytes Relative: 5.5 % (ref 3.0–12.0)
Neutro Abs: 6.2 10*3/uL (ref 1.4–7.7)
Neutrophils Relative %: 69.6 % (ref 43.0–77.0)
Platelets: 370 10*3/uL (ref 150.0–400.0)
RBC: 4.32 Mil/uL (ref 3.87–5.11)
RDW: 15.7 % — ABNORMAL HIGH (ref 11.5–15.5)
WBC: 8.9 10*3/uL (ref 4.0–10.5)

## 2015-10-23 LAB — BASIC METABOLIC PANEL
BUN: 11 mg/dL (ref 6–23)
CO2: 29 mEq/L (ref 19–32)
Calcium: 9.8 mg/dL (ref 8.4–10.5)
Chloride: 101 mEq/L (ref 96–112)
Creatinine, Ser: 0.85 mg/dL (ref 0.40–1.20)
GFR: 87.71 mL/min (ref >60.00)
Glucose, Bld: 100 mg/dL — ABNORMAL HIGH (ref 70–99)
Potassium: 3.5 mEq/L (ref 3.5–5.1)
Sodium: 140 mEq/L (ref 135–145)

## 2015-10-23 LAB — RETICULOCYTES
ABS Retic: 72930 cells/uL (ref 20000–80000)
RBC.: 4.29 MIL/uL (ref 3.80–5.10)
Retic Ct Pct: 1.7 %

## 2015-10-23 LAB — IBC PANEL
Iron: 38 ug/dL — ABNORMAL LOW (ref 42–145)
Saturation Ratios: 8.3 % — ABNORMAL LOW (ref 20.0–50.0)
Transferrin: 329 mg/dL (ref 212.0–360.0)

## 2015-10-23 LAB — VITAMIN B12: Vitamin B-12: 262 pg/mL (ref 211–911)

## 2015-10-23 LAB — FOLATE: Folate: 9.6 ng/mL (ref >5.9)

## 2015-10-23 LAB — FERRITIN: Ferritin: 8.1 ng/mL — ABNORMAL LOW (ref 10.0–291.0)

## 2015-10-23 MED ORDER — ZOSTER VACCINE LIVE 19400 UNT/0.65ML ~~LOC~~ SUSR: 0.6500 mL | Freq: Once | SUBCUTANEOUS | 0 refills | Status: AC

## 2015-10-23 MED ORDER — ESZOPICLONE 3 MG PO TABS: 3.0000 mg | ORAL_TABLET | Freq: Every day | ORAL | 5 refills | Status: DC

## 2015-10-23 NOTE — Progress Notes (Signed)
Subjective:  Patient ID: [name], female    DOB: January 16, 1955  Age: 60 y.o. MRN: [id number]  CC: Anemia; Hypertension; and Asthma   HPI [name] presents for concerns about anemia. She recently saw her gynecologist and was told that she is anemic. She is not aware of any sources of blood loss. She does complain of fatigue and intermittent shortness of breath. She denies bleeding, numbness, easy bruising, or abdominal pain.  Outpatient Medications Prior to Visit  Medication Sig Dispense Refill  . aspirin 325 MG tablet Take 325 mg by mouth daily as needed for mild pain, moderate pain or headache.    . DULoxetine (CYMBALTA) 30 MG capsule TAKE 30 MG BY MOUTH  DAILY 90 capsule 3  . fluticasone furoate-vilanterol (BREO ELLIPTA) 200-25 MCG/INH AEPB Inhale 1 puff into the lungs daily. 30 each 11  . mometasone (NASONEX) 50 MCG/ACT nasal spray Place 4 sprays into the nose daily. 17 g 3  . montelukast (SINGULAIR) 10 MG tablet TAKE 1 TABLET (10 MG TOTAL) BY MOUTH AT BEDTIME. 90 tablet 2  . potassium chloride SA (K-DUR,KLOR-CON) 20 MEQ tablet Take 1 tablet (20 mEq total) by mouth daily. 90 tablet 1  . pregabalin (LYRICA) 75 MG capsule Take 1 capsule (75 mg total) by mouth at bedtime. 90 capsule 1  . triamterene-hydrochlorothiazide (DYAZIDE) 37.5-25 MG capsule Take 1 each (1 capsule total) by mouth daily. 90 capsule 2  . Vitamin D, Ergocalciferol, (DRISDOL) 50000 units CAPS capsule TAKE 1 CAPSULE ONCE EVERY WEEK 12 capsule 2  . Lorcaserin HCl ER (BELVIQ XR) 20 MG TB24 Take 1 tablet by mouth daily. 30 tablet 5  . Multiple Vitamins-Minerals (MULTIVITAMIN,TX-MINERALS) tablet Take 1 tablet by mouth daily.       No facility-administered medications prior to visit.     ROS Review of Systems  Constitutional: Positive for fatigue. Negative for activity change, appetite change, chills, diaphoresis, fever and unexpected weight change.  HENT: Negative.  Negative for sore throat, trouble swallowing and  voice change.   Eyes: Negative for visual disturbance.  Respiratory: Positive for apnea and shortness of breath. Negative for cough, choking, chest tightness, wheezing and stridor.   Cardiovascular: Negative for chest pain, palpitations and leg swelling.  Gastrointestinal: Negative for abdominal pain, constipation, diarrhea, nausea and vomiting.  Endocrine: Negative.   Genitourinary: Negative.  Negative for decreased urine volume, difficulty urinating, dysuria, flank pain, frequency and urgency.  Musculoskeletal: Negative for arthralgias, back pain, joint swelling and myalgias.  Skin: Negative.  Negative for color change and rash.  Allergic/Immunologic: Negative.   Neurological: Negative.  Negative for dizziness, weakness, light-headedness and numbness.  Hematological: Negative.  Negative for adenopathy. Does not bruise/bleed easily.  Psychiatric/Behavioral: Positive for sleep disturbance. Negative for decreased concentration, dysphoric mood and suicidal ideas. The patient is not nervous/anxious.        She complains of difficulty falling asleep and frequent awakenings    Objective:  BP 124/74   Pulse (!) 102   Temp 98.3 F (36.8 C) (Oral)   Resp 20   Wt 269 lb 12 oz (122.4 kg)   SpO2 94%   BMI 47.78 kg/m   BP Readings from Last 3 Encounters:  10/23/15 124/74  07/20/15 124/70  06/27/15 120/80    Wt Readings from Last 3 Encounters:  10/23/15 269 lb 12 oz (122.4 kg)  07/20/15 270 lb (122.5 kg)  06/27/15 269 lb (122 kg)    Physical Exam  Constitutional: She is oriented to person, place, and time.  No distress.  HENT:  Mouth/Throat: Oropharynx is clear and moist. No oropharyngeal exudate.  Eyes: Conjunctivae are normal. Right eye exhibits no discharge. Left eye exhibits no discharge. No scleral icterus.  Neck: Normal range of motion. Neck supple. No JVD present. No tracheal deviation present. No thyromegaly present.  Cardiovascular: Normal rate, regular rhythm, normal heart  sounds and intact distal pulses.  Exam reveals no gallop and no friction rub.   No murmur heard. Pulmonary/Chest: Effort normal and breath sounds normal. No stridor. No respiratory distress. She has no wheezes. She has no rales. She exhibits no tenderness.  Abdominal: Soft. Bowel sounds are normal. She exhibits no distension and no mass. There is no tenderness. There is no rebound and no guarding.  Musculoskeletal: Normal range of motion. She exhibits no edema, tenderness or deformity.  Lymphadenopathy:    She has no cervical adenopathy.  Neurological: She is oriented to person, place, and time. She displays normal reflexes. No cranial nerve deficit. Coordination normal.  Skin: Skin is warm and dry. No rash noted. She is not diaphoretic. No erythema. No pallor.  Psychiatric: She has a normal mood and affect. Her behavior is normal. Judgment and thought content normal.  Vitals reviewed.   Lab Results  Component Value Date   WBC 8.9 10/23/2015   HGB 11.5 (L) 10/23/2015   HCT 35.4 (L) 10/23/2015   PLT 370.0 10/23/2015   GLUCOSE 100 (H) 10/23/2015   CHOL 187 04/26/2015   TRIG 129.0 04/26/2015   HDL 50.30 04/26/2015   LDLCALC 111 (H) 04/26/2015   ALT 24 04/26/2015   AST 15 04/26/2015   NA 140 10/23/2015   K 3.5 10/23/2015   CL 101 10/23/2015   CREATININE 0.85 10/23/2015   BUN 11 10/23/2015   CO2 29 10/23/2015   TSH 1.37 04/26/2015   HGBA1C 6.0 04/26/2015    Ct Abdomen Pelvis W Contrast  Result Date: 02/20/2015 CLINICAL DATA:  Left upper quadrant abdomen pain and nausea starting last night. EXAM: CT ABDOMEN AND PELVIS WITH CONTRAST TECHNIQUE: Multidetector CT imaging of the abdomen and pelvis was performed using the standard protocol following bolus administration of intravenous contrast. CONTRAST:  OMNIPAQUE IOHEXOL 300 MG/ML  SOLN COMPARISON:  October 15, 2004 FINDINGS: Patient status post prior cholecystectomy. There is postsurgical intra and extrahepatic biliary ductal  dilatation with common duct bile duct measuring 1.5 cm. The liver is otherwise normal. No focal liver lesion is identified. The spleen, pancreas are normal. The adrenal glands are normal bilaterally. Bilateral parapelvic renal cysts are identified. There is no hydronephrosis bilaterally. There is a tiny cyst in the upper pole of right kidney. There is minimal atherosclerosis of the aorta. There is no aneurysmal dilatation. There is no abdominal lymphadenopathy. There is radiopaque banding around the GE junction. There are multiple dilated small bowel loops with transition probably in the distal jejunum or proximal ileum. The distal ileum loops are normal in caliber. There is diverticulosis of colon without diverticulitis. The patient is status post prior appendectomy. Partial fluid-filled bladder is normal. The patient is status post prior hysterectomy. There is mild dependent atelectasis of the lung bases. Degenerative joint changes of the spine are identified. IMPRESSION: Dilated small bowel loops with transition probably in the distal jejunum or proximal ileum. The distal ileal loops are normal in caliber. The findings are consistent with early small bowel obstruction. Status post cholecystectomy with postsurgical intra and extrahepatic biliary ductal dilatation. Electronically Signed   By: Sherian Rein M.D.   On:  02/20/2015 14:29   Dg Abd 2 Views  Result Date: 02/21/2015 CLINICAL DATA:  Followup small bowel obstruction. Persistent diffuse abdominal pain. EXAM: ABDOMEN - 2 VIEW COMPARISON:  CT 02/20/2015. FINDINGS: Lap band remains visible with a normal orientation. Surgical clips in the right upper quadrant of the abdomen. Persistent small bowel obstruction pattern with dilated fluid and air-filled loops of small intestine. No free air. IMPRESSION: Persistent small bowel obstruction pattern.  No free air. Electronically Signed   By: Paulina FusiMark  Shogry M.D.   On: 02/21/2015 08:13    Assessment & Plan:    [name] was seen today for anemia, hypertension and asthma.  Diagnoses and all orders for this visit:  Deficiency anemia- I've asked her to see GI for workup of GI sources of blood loss, she is deficient in iron and B12 so will start replacing those. -     CBC with Differential/Platelet; Future -     IBC panel; Future -     Ferritin; Future -     Folate; Future -     Vitamin B12; Future -     Reticulocytes; Future -     Methylmalonic acid, serum; Future -     Ambulatory referral to Gastroenterology  Screen for colon cancer -     Ambulatory referral to Gastroenterology  Essential hypertension- her blood pressure is well-controlled, electrolytes and renal function are stable. -     Basic metabolic panel; Future  Need for prophylactic vaccination against Streptococcus pneumoniae (pneumococcus) -     Pneumococcal conjugate vaccine 13-valent  Insomnia with sleep apnea -     Eszopiclone 3 MG TABS; Take 1 tablet (3 mg total) by mouth at bedtime. Take immediately before bedtime  Other iron deficiency anemia -     Iron-Folic Acid-B12-C-Docusate (FERRAPLUS 90) 90-1 MG TABS; Take 1 tablet by mouth daily.  Other vitamin B12 deficiency anemia -     cyanocobalamin 2000 MCG tablet; Take 1 tablet (2,000 mcg total) by mouth daily.  Other orders -     Zoster Vaccine Live, PF, (ZOSTAVAX) [id number] UNT/0.65ML injection; Inject 19,400 Units into the skin once.   I have discontinued Ms. Sallas's (multivitamin,tx-minerals) and Lorcaserin HCl ER. I am also having her start on Zoster Vaccine Live (PF), Eszopiclone, FERRAPLUS 90, and cyanocobalamin. Additionally, I am having her maintain her aspirin, DULoxetine, triamterene-hydrochlorothiazide, montelukast, potassium chloride SA, Vitamin D (Ergocalciferol), fluticasone furoate-vilanterol, pregabalin, and mometasone.  Meds ordered this encounter  Medications  . Zoster Vaccine Live, PF, (ZOSTAVAX) [id number] UNT/0.65ML injection    Sig: Inject 19,400 Units  into the skin once.    Dispense:  1 each    Refill:  0  . Eszopiclone 3 MG TABS    Sig: Take 1 tablet (3 mg total) by mouth at bedtime. Take immediately before bedtime    Dispense:  30 tablet    Refill:  5  . Iron-Folic Acid-B12-C-Docusate (FERRAPLUS 90) 90-1 MG TABS    Sig: Take 1 tablet by mouth daily.    Dispense:  90 tablet    Refill:  3  . cyanocobalamin 2000 MCG tablet    Sig: Take 1 tablet (2,000 mcg total) by mouth daily.    Dispense:  90 tablet    Refill:  3     Follow-up: Return in about 3 months (around 01/23/2016).  Sanda Lingerhomas Shakeria Robinette, MD

## 2015-10-23 NOTE — Progress Notes (Signed)
Pre visit review using our clinic review tool, if applicable. No additional management support is needed unless otherwise documented below in the visit note. 

## 2015-10-23 NOTE — Patient Instructions (Signed)
Anemia, Nonspecific Anemia is a condition in which the concentration of red blood cells or hemoglobin in the blood is below normal. Hemoglobin is a substance in red blood cells that carries oxygen to the tissues of the body. Anemia results in not enough oxygen reaching these tissues.  CAUSES  Common causes of anemia include:   Excessive bleeding. Bleeding may be internal or external. This includes excessive bleeding from periods (in women) or from the intestine.   Poor nutrition.   Chronic kidney, thyroid, and liver disease.  Bone marrow disorders that decrease red blood cell production.  Cancer and treatments for cancer.  HIV, AIDS, and their treatments.  Spleen problems that increase red blood cell destruction.  Blood disorders.  Excess destruction of red blood cells due to infection, medicines, and autoimmune disorders. SIGNS AND SYMPTOMS   Minor weakness.   Dizziness.   Headache.  Palpitations.   Shortness of breath, especially with exercise.   Paleness.  Cold sensitivity.  Indigestion.  Nausea.  Difficulty sleeping.  Difficulty concentrating. Symptoms may occur suddenly or they may develop slowly.  DIAGNOSIS  Additional blood tests are often needed. These help your health care provider determine the best treatment. Your health care provider will check your stool for blood and look for other causes of blood loss.  TREATMENT  Treatment varies depending on the cause of the anemia. Treatment can include:   Supplements of iron, vitamin B12, or folic acid.   Hormone medicines.   A blood transfusion. This may be needed if blood loss is severe.   Hospitalization. This may be needed if there is significant continual blood loss.   Dietary changes.  Spleen removal. HOME CARE INSTRUCTIONS Keep all follow-up appointments. It often takes many weeks to correct anemia, and having your health care provider check on your condition and your response to  treatment is very important. SEEK IMMEDIATE MEDICAL CARE IF:   You develop extreme weakness, shortness of breath, or chest pain.   You become dizzy or have trouble concentrating.  You develop heavy vaginal bleeding.   You develop a rash.   You have bloody or black, tarry stools.   You faint.   You vomit up blood.   You vomit repeatedly.   You have abdominal pain.  You have a fever or persistent symptoms for more than 2-3 days.   You have a fever and your symptoms suddenly get worse.   You are dehydrated.  MAKE SURE YOU:  Understand these instructions.  Will watch your condition.  Will get help right away if you are not doing well or get worse.   This information is not intended to replace advice given to you by your health care provider. Make sure you discuss any questions you have with your health care provider.   Document Released: 01/31/2004 Document Revised: 08/25/2012 Document Reviewed: 06/18/2012 Elsevier Interactive Patient Education 2016 [employer] Inc.  

## 2015-10-24 ENCOUNTER — Encounter: Payer: Self-pay | Admitting: Internal Medicine

## 2015-10-24 MED ORDER — FERRAPLUS 90 90-1 MG PO TABS: 1.0000 | ORAL_TABLET | Freq: Every day | ORAL | 3 refills | Status: DC

## 2015-10-24 MED ORDER — CYANOCOBALAMIN 2000 MCG PO TABS: 2000.0000 ug | ORAL_TABLET | Freq: Every day | ORAL | 3 refills | Status: DC

## 2015-10-25 ENCOUNTER — Encounter: Payer: Self-pay | Admitting: Internal Medicine

## 2015-10-25 LAB — METHYLMALONIC ACID, SERUM: Methylmalonic Acid, Quant: 145 nmol/L (ref 87–318)

## 2015-11-22 ENCOUNTER — Other Ambulatory Visit: Payer: Self-pay | Admitting: Gastroenterology

## 2015-12-17 ENCOUNTER — Encounter (HOSPITAL_COMMUNITY): Payer: Self-pay

## 2015-12-20 NOTE — H&P (Signed)
[name] HPI: This 60 year old black female presents to the office for colorectal cancer screening. She has 1 BM every other day with no obvious blood or mucus in the stool. She has iron deficiency anemia but only is taking an Iron supplement every other day, due to nausea and change in bowel habits when she takes the iron. Her last CBC done on 10/23/2015 revealed a hemoglobin of 11.5 gm/dl with an iron of 30 and a Ferritin of 8.1. She has dizziness, shortness of breath with fatigue. She was also having neuropathy and restless leg syndrome. Since starting the Iron supplements that problem has resolved. She has good appetite and her weight has been stable. She has LLQ pain at times when she is constipated. She is currently taking stool softeners and MiraLax as needed. She has a past history of small bowel obstuction in February, 2017 and previously in 2000 as well. She had a severe bout of pancreatitis in 2006 folllowing an ERCP done by Dr. Russella DarStark; this was complicated by ARDS requiring intubation and then a tracheostomy. She denies having any complaints of vomiting, acid reflux, dysphagia or odynophagia. She denies having a family history of colon cancer, celiac sprue or IBD. Her last colonoscopy was done in 2006 by Dr. Russella DarStark which reportedly revealed diverticulosis but was otherwise normal.   Past Medical History:  Diagnosis Date  . Anxiety disorder   . Chronic cough   . Complication of anesthesia    Had ERCP in 2006 and led to respiratory failure, caused pancreatits, was in coma for 30days.  . Depression   . GERD (gastroesophageal reflux disease)   . History of ERCP   . History of pancreatitis   . History of tracheostomy   . HTN (hypertension)   . Hyperglycemia   . Hypokalemia   . Respiratory failure [facility](HCC)     Past Surgical History:  Procedure Laterality Date  . ABDOMINAL HYSTERECTOMY     Total Hysterectomy   . APPENDECTOMY    . BREAST REDUCTION SURGERY    . CHOLECYSTECTOMY    . ERCP     . HAMMER TOE SURGERY    . INTESTINAL MALROTATION REPAIR  1999  . TUBAL LIGATION      Family History  Problem Relation Age of Onset  . Diabetes Other   . Hypertension Other     Social History:  reports that she has never smoked. She has never used smokeless tobacco. She reports that she does not drink alcohol or use drugs.  Allergies:  Allergies  Allergen Reactions  . Fastin [Phentermine]     headache  . Lisinopril     REACTION: cough, facial swelling    Medications: Scheduled: Continuous:  No results found for this or any previous visit (from the past 24 hour(s)).   No results found.  ROS:  As stated above in the HPI otherwise negative.  There were no vitals taken for this visit.    PE: Gen: NAD, Alert and Oriented HEENT:  [place]/AT, EOMI Neck: Supple, no LAD Lungs: CTA Bilaterally CV: RRR without M/G/R ABM: Soft, NTND, +BS Ext: No C/C/E  Assessment/Plan: 1) IDA - EGD/Colonoscopy.  Juvenal Umar D 12/20/2015, 12:45 PM

## 2015-12-21 ENCOUNTER — Ambulatory Visit (HOSPITAL_COMMUNITY): Payer: PRIVATE HEALTH INSURANCE | Admitting: Registered Nurse

## 2015-12-21 ENCOUNTER — Encounter (HOSPITAL_COMMUNITY): Payer: Self-pay | Admitting: Anesthesiology

## 2015-12-21 ENCOUNTER — Ambulatory Visit (HOSPITAL_COMMUNITY)
Admission: RE | Admit: 2015-12-21 | Discharge: 2015-12-21 | Disposition: A | Discharge status: Home / Self Care | Payer: PRIVATE HEALTH INSURANCE | Source: Ambulatory Visit | Attending: Gastroenterology | Admitting: Gastroenterology

## 2015-12-21 ENCOUNTER — Encounter (HOSPITAL_COMMUNITY)
Admission: RE | Disposition: A | Discharge status: Home / Self Care | Payer: Self-pay | Source: Ambulatory Visit | Attending: Gastroenterology

## 2015-12-21 DIAGNOSIS — R05 Cough: Secondary | ICD-10-CM | POA: Insufficient documentation

## 2015-12-21 DIAGNOSIS — Z6841 Body Mass Index (BMI) 40.0 and over, adult: Secondary | ICD-10-CM | POA: Insufficient documentation

## 2015-12-21 DIAGNOSIS — E876 Hypokalemia: Secondary | ICD-10-CM | POA: Insufficient documentation

## 2015-12-21 DIAGNOSIS — K5900 Constipation, unspecified: Secondary | ICD-10-CM | POA: Insufficient documentation

## 2015-12-21 DIAGNOSIS — Z90710 Acquired absence of both cervix and uterus: Secondary | ICD-10-CM | POA: Insufficient documentation

## 2015-12-21 DIAGNOSIS — Z833 Family history of diabetes mellitus: Secondary | ICD-10-CM | POA: Insufficient documentation

## 2015-12-21 DIAGNOSIS — Z8249 Family history of ischemic heart disease and other diseases of the circulatory system: Secondary | ICD-10-CM | POA: Insufficient documentation

## 2015-12-21 DIAGNOSIS — Z888 Allergy status to other drugs, medicaments and biological substances status: Secondary | ICD-10-CM | POA: Diagnosis not present

## 2015-12-21 DIAGNOSIS — G2581 Restless legs syndrome: Secondary | ICD-10-CM | POA: Diagnosis not present

## 2015-12-21 DIAGNOSIS — F329 Major depressive disorder, single episode, unspecified: Secondary | ICD-10-CM | POA: Insufficient documentation

## 2015-12-21 DIAGNOSIS — F419 Anxiety disorder, unspecified: Secondary | ICD-10-CM | POA: Insufficient documentation

## 2015-12-21 DIAGNOSIS — K219 Gastro-esophageal reflux disease without esophagitis: Secondary | ICD-10-CM | POA: Insufficient documentation

## 2015-12-21 DIAGNOSIS — Z930 Tracheostomy status: Secondary | ICD-10-CM | POA: Diagnosis not present

## 2015-12-21 DIAGNOSIS — I10 Essential (primary) hypertension: Secondary | ICD-10-CM | POA: Diagnosis not present

## 2015-12-21 DIAGNOSIS — K5730 Diverticulosis of large intestine without perforation or abscess without bleeding: Secondary | ICD-10-CM | POA: Insufficient documentation

## 2015-12-21 DIAGNOSIS — Z9049 Acquired absence of other specified parts of digestive tract: Secondary | ICD-10-CM | POA: Insufficient documentation

## 2015-12-21 DIAGNOSIS — D509 Iron deficiency anemia, unspecified: Secondary | ICD-10-CM | POA: Insufficient documentation

## 2015-12-21 DIAGNOSIS — K2950 Unspecified chronic gastritis without bleeding: Secondary | ICD-10-CM | POA: Insufficient documentation

## 2015-12-21 HISTORY — PX: COLONOSCOPY WITH PROPOFOL: SHX5780

## 2015-12-21 HISTORY — DX: Other complications of anesthesia, initial encounter: T88.59XA

## 2015-12-21 HISTORY — DX: Adverse effect of unspecified anesthetic, initial encounter: T41.45XA

## 2015-12-21 HISTORY — PX: ESOPHAGOGASTRODUODENOSCOPY: SHX5428

## 2015-12-21 SURGERY — COLONOSCOPY WITH PROPOFOL
Anesthesia: Monitor Anesthesia Care

## 2015-12-21 MED ORDER — LACTATED RINGERS IV SOLN
INTRAVENOUS | Status: DC
  Administered 2015-12-21: 1000 mL via INTRAVENOUS

## 2015-12-21 MED ORDER — GLYCOPYRROLATE 0.2 MG/ML IJ SOLN
INTRAMUSCULAR | Status: DC | PRN
  Administered 2015-12-21: .2 mg via INTRAVENOUS
  Administered 2015-12-21: 0.2 mg via INTRAVENOUS

## 2015-12-21 MED ORDER — LIDOCAINE HCL (CARDIAC) 20 MG/ML IV SOLN
INTRAVENOUS | Status: DC | PRN
  Administered 2015-12-21: 75 mg via INTRAVENOUS

## 2015-12-21 MED ORDER — GLYCOPYRROLATE 0.2 MG/ML IV SOSY
PREFILLED_SYRINGE | INTRAVENOUS | Status: AC
  Filled 2015-12-21: qty 3

## 2015-12-21 MED ORDER — PROPOFOL 10 MG/ML IV BOLUS
INTRAVENOUS | Status: AC
  Filled 2015-12-21: qty 40

## 2015-12-21 MED ORDER — PROPOFOL 10 MG/ML IV BOLUS
INTRAVENOUS | Status: AC
  Filled 2015-12-21: qty 20

## 2015-12-21 MED ORDER — SODIUM CHLORIDE 0.9 % IV SOLN: INTRAVENOUS | Status: DC

## 2015-12-21 MED ORDER — LIDOCAINE 2% (20 MG/ML) 5 ML SYRINGE
INTRAMUSCULAR | Status: AC
  Filled 2015-12-21: qty 5

## 2015-12-21 MED ORDER — LACTATED RINGERS IV SOLN
INTRAVENOUS | Status: DC | PRN
  Administered 2015-12-21: 10:00:00 via INTRAVENOUS

## 2015-12-21 MED ORDER — PROPOFOL 500 MG/50ML IV EMUL
INTRAVENOUS | Status: DC | PRN
  Administered 2015-12-21: 150 ug/kg/min via INTRAVENOUS

## 2015-12-21 SURGICAL SUPPLY — 22 items

## 2015-12-21 NOTE — Anesthesia Postprocedure Evaluation (Signed)
Anesthesia Post Note  Patient: [name]  Procedure(s) Performed: Procedure(s) (LRB): COLONOSCOPY WITH PROPOFOL (N/A) ESOPHAGOGASTRODUODENOSCOPY (EGD) (N/A)  Patient location during evaluation: PACU Anesthesia Type: MAC Level of consciousness: awake and alert and oriented Pain management: pain level controlled Vital Signs Assessment: post-procedure vital signs reviewed and stable Respiratory status: spontaneous breathing, nonlabored ventilation and respiratory function stable Cardiovascular status: stable and blood pressure returned to baseline Postop Assessment: no signs of nausea or vomiting Anesthetic complications: no    Last Vitals:  Vitals:   12/21/15 0932 12/21/15 1032  BP: 133/67 112/72  Pulse: 90 (!) 106  Resp: 13 (!) 30  Temp: 36.7 C     Last Pain:  Vitals:   12/21/15 0932  TempSrc: Oral                 Zavier Canela A.

## 2015-12-21 NOTE — Anesthesia Preprocedure Evaluation (Addendum)
Anesthesia Evaluation    History of Anesthesia Complications (+) history of anesthetic complications  Airway Mallampati: III  TM Distance: >3 FB Neck ROM: Full    Dental  (+) Teeth Intact   Pulmonary asthma , sleep apnea and Continuous Positive Airway Pressure Ventilation ,  Hx/o Tracheostomy- respiratory failure due to pancreatitis S/P ERCP   Pulmonary exam normal breath sounds clear to auscultation       Cardiovascular hypertension, Pt. on medications Normal cardiovascular exam Rhythm:Regular Rate:Normal     Neuro/Psych PSYCHIATRIC DISORDERS Depression Restless legs syndrome  Neuromuscular disease    GI/Hepatic Neg liver ROS, GERD  Medicated and Controlled,Screening Colonoscopy Hx/o Pancreatitis S/P ERCP   Endo/Other  Morbid obesity  Renal/GU negative Renal ROS Bladder dysfunction  OAB    Musculoskeletal negative musculoskeletal ROS (+)   Abdominal (+) + obese,   Peds  Hematology  (+) anemia ,   Anesthesia Other Findings   Reproductive/Obstetrics                            Anesthesia Physical Anesthesia Plan  ASA: III  Anesthesia Plan: MAC   Post-op Pain Management:    Induction: Intravenous  Airway Management Planned: Natural Airway, Nasal Cannula and Simple Face Mask  Additional Equipment:   Intra-op Plan:   Post-operative Plan:   Informed Consent: I have reviewed the patients History and Physical, chart, labs and discussed the procedure including the risks, benefits and alternatives for the proposed anesthesia with the patient or authorized representative who has indicated his/her understanding and acceptance.     Plan Discussed with: Anesthesiologist, CRNA and Surgeon  Anesthesia Plan Comments:         Anesthesia Quick Evaluation

## 2015-12-21 NOTE — Transfer of Care (Signed)
Immediate Anesthesia Transfer of Care Note  Patient: [name]  Procedure(s) Performed: Procedure(s): COLONOSCOPY WITH PROPOFOL (N/A) ESOPHAGOGASTRODUODENOSCOPY (EGD) (N/A)  Patient Location: PACU  Anesthesia Type:MAC  Level of Consciousness: awake, alert , oriented and patient cooperative  Airway & Oxygen Therapy: Patient Spontanous Breathing and Patient connected to face mask oxygen  Post-op Assessment: Report given to RN, Post -op Vital signs reviewed and stable and Patient moving all extremities X 4  Post vital signs: stable  Last Vitals:  Vitals:   12/21/15 0932 12/21/15 1032  BP: 133/67 112/72  Pulse: 90 (!) 106  Resp: 13 (!) 30  Temp: 36.7 C     Last Pain:  Vitals:   12/21/15 0932  TempSrc: Oral         Complications: No apparent anesthesia complications

## 2015-12-21 NOTE — Op Note (Signed)
[facility] Patient Name: [name] Procedure Date: 12/21/2015 MRN: [id number] Attending MD: Jeani HawkingPatrick Mariangela Heldt , MD Date of Birth: 06/21/1955 CSN: [id number] Age: [age] Admit Type: Outpatient Procedure:                Colonoscopy Indications:              Screening for colorectal malignant neoplasm Providers:                Jeani HawkingPatrick Kentrail Shew, MD, Janae SauceKaren D. Steele BergHinson, RN, Harrington ChallengerHope Parker,                            Technician Referring MD:              Medicines:                Propofol per Anesthesia Complications:            No immediate complications. Estimated Blood Loss:     Estimated blood loss: none. Procedure:                Pre-Anesthesia Assessment:                           - Prior to the procedure, a History and Physical                            was performed, and patient medications and                            allergies were reviewed. The patient's tolerance of                            previous anesthesia was also reviewed. The risks                            and benefits of the procedure and the sedation                            options and risks were discussed with the patient.                            All questions were answered, and informed consent                            was obtained. Prior Anticoagulants: The patient has                            taken no previous anticoagulant or antiplatelet                            agents. ASA Grade Assessment: III - A patient with                            severe systemic disease. After reviewing the risks  and benefits, the patient was deemed in                            satisfactory condition to undergo the procedure.                           - Sedation was administered by an anesthesia                            professional. Deep sedation was attained.                           After obtaining informed consent, the colonoscope                            was passed under direct  vision. Throughout the                            procedure, the patient's blood pressure, pulse, and                            oxygen saturations were monitored continuously. The                            EC-[id number] ([id number] scope was introduced through                            the anus and advanced to the the cecum, identified                            by appendiceal orifice and ileocecal valve. The                            colonoscopy was performed without difficulty. The                            patient tolerated the procedure well. The quality                            of the bowel preparation was excellent. The                            ileocecal valve, appendiceal orifice, and rectum                            were photographed. Scope In: 9:59:57 AM Scope Out: 10:14:02 AM Scope Withdrawal Time: 0 hours 10 minutes 50 seconds  Total Procedure Duration: 0 hours 14 minutes 5 seconds  Findings:      Scattered small and large-mouthed diverticula were found in the sigmoid       colon and descending colon. Impression:               - Diverticulosis in the sigmoid colon and in the  descending colon.                           - No specimens collected. Moderate Sedation:      N/A- Per Anesthesia Care Recommendation:           - Patient has a contact number available for                            emergencies. The signs and symptoms of potential                            delayed complications were discussed with the                            patient. Return to normal activities tomorrow.                            Written discharge instructions were provided to the                            patient.                           - Resume previous diet.                           - Continue present medications.                           - Repeat colonoscopy in 10 years for surveillance. Procedure Code(s):        --- Professional ---                            [phone], Colonoscopy, flexible; diagnostic, including                            collection of specimen(s) by brushing or washing,                            when performed (separate procedure) Diagnosis Code(s):        --- Professional ---                           Z12.11, Encounter for screening for malignant                            neoplasm of colon                           K57.30, Diverticulosis of large intestine without                            perforation or abscess without bleeding CPT copyright 2016 American [employer] Association. All rights reserved. The codes documented in this report are preliminary and upon coder review may  be revised to meet current compliance requirements. Jeani Hawking, MD Jeani Hawking,  MD 12/21/2015 10:26:36 AM This report has been signed electronically. Number of Addenda: 0

## 2015-12-21 NOTE — Op Note (Signed)
[name] Patient Name: [name] Procedure Date: 12/21/2015 MRN: [id number] Attending MD: Jeani Hawking , MD Date of Birth: 12/01/1955 CSN: [id number] Age: 60 Admit Type: Outpatient Procedure:                Upper GI endoscopy Indications:              Iron deficiency anemia Providers:                Jeani Hawking, MD, Dow Adolph, RN, Harrington Challenger,                            Technician Referring MD:              Medicines:                Propofol per Anesthesia Complications:            No immediate complications. Estimated Blood Loss:     Estimated blood loss was minimal. Procedure:                Pre-Anesthesia Assessment:                           - Prior to the procedure, a History and Physical                            was performed, and patient medications and                            allergies were reviewed. The patient's tolerance of                            previous anesthesia was also reviewed. The risks                            and benefits of the procedure and the sedation                            options and risks were discussed with the patient.                            All questions were answered, and informed consent                            was obtained. Prior Anticoagulants: The patient has                            taken no previous anticoagulant or antiplatelet                            agents. ASA Grade Assessment: III - A patient with                            severe systemic disease. After reviewing the risks  and benefits, the patient was deemed in                            satisfactory condition to undergo the procedure.                           - Sedation was administered by an anesthesia                            professional. Deep sedation was attained.                           After obtaining informed consent, the endoscope was                            passed under direct vision.  Throughout the                            procedure, the patient's blood pressure, pulse, and                            oxygen saturations were monitored continuously. The                            [id number] ([id number] scope was introduced through the                            mouth, and advanced to the second part of duodenum.                            The upper GI endoscopy was somewhat difficult due                            to the patient's discomfort during the procedure.                            She had a strong gag reflex and there was a                            significant amount of coughing. The patient                            tolerated the procedure fairly well. Scope In: Scope Out: Findings:      The examined esophagus was normal.      Segmental moderate inflammation characterized by erythema and       granularity was found in the gastric antrum. Biopsies were taken with a       cold forceps for histology. Retroflexion revealed an intact lap band.      The examined duodenum was normal. Biopsies for histology were taken with       a cold forceps for evaluation of celiac disease. Impression:               - Normal esophagus.                           -  Gastritis. Biopsied.                           - Normal examined duodenum. Biopsied. Moderate Sedation:      N/A- Per Anesthesia Care Recommendation:           - Patient has a contact number available for                            emergencies. The signs and symptoms of potential                            delayed complications were discussed with the                            patient. Return to normal activities tomorrow.                            Written discharge instructions were provided to the                            patient.                           - Resume previous diet.                           - Continue present medications.                           - Await pathology results.                            - Follow up with Dr. Loreta AveMann in one month. Procedure Code(s):        --- Professional ---                           [id number], Esophagogastroduodenoscopy, flexible,                            transoral; with biopsy, single or multiple Diagnosis Code(s):        --- Professional ---                           K29.70, Gastritis, unspecified, without bleeding                           D50.9, Iron deficiency anemia, unspecified CPT copyright 2016 American Medical Association. All rights reserved. The codes documented in this report are preliminary and upon coder review may  be revised to meet current compliance requirements. Jeani HawkingPatrick Joelle Flessner, MD Jeani HawkingPatrick Izel Eisenhardt, MD 12/21/2015 10:31:42 AM This report has been signed electronically. Number of Addenda: 0

## 2015-12-21 NOTE — Discharge Instructions (Signed)

## 2015-12-24 ENCOUNTER — Encounter (HOSPITAL_COMMUNITY): Payer: Self-pay | Admitting: Gastroenterology

## 2016-01-23 ENCOUNTER — Ambulatory Visit: Payer: PRIVATE HEALTH INSURANCE | Admitting: Internal Medicine

## 2016-01-25 ENCOUNTER — Other Ambulatory Visit: Payer: Self-pay | Admitting: *Deleted

## 2016-01-25 DIAGNOSIS — I10 Essential (primary) hypertension: Secondary | ICD-10-CM

## 2016-01-25 MED ORDER — TRIAMTERENE-HCTZ 37.5-25 MG PO CAPS: 1.0000 | ORAL_CAPSULE | Freq: Every day | ORAL | 1 refills | Status: DC

## 2016-01-25 NOTE — Telephone Encounter (Signed)
Rec's call pt requesting refill on her Triamterene. She states pharmacy has sent request but haven't received response back. Verified chanrt no electronic script has came through inform will send to [facility].../lmb

## 2016-01-29 ENCOUNTER — Ambulatory Visit (HOSPITAL_COMMUNITY)
Admission: RE | Admit: 2016-01-29 | Discharge: 2016-01-29 | Disposition: A | Discharge status: Home / Self Care | Payer: PRIVATE HEALTH INSURANCE | Source: Ambulatory Visit | Attending: Gastroenterology | Admitting: Gastroenterology

## 2016-01-29 ENCOUNTER — Encounter (HOSPITAL_COMMUNITY)
Admission: RE | Disposition: A | Discharge status: Home / Self Care | Payer: Self-pay | Source: Ambulatory Visit | Attending: Gastroenterology

## 2016-01-29 DIAGNOSIS — D509 Iron deficiency anemia, unspecified: Secondary | ICD-10-CM | POA: Diagnosis not present

## 2016-01-29 HISTORY — PX: GIVENS CAPSULE STUDY: SHX5432

## 2016-01-29 SURGERY — IMAGING PROCEDURE, GI TRACT, INTRALUMINAL, VIA CAPSULE
Anesthesia: LOCAL

## 2016-01-29 SURGICAL SUPPLY — 1 items: TOWEL COTTON PACK 4EA (MISCELLANEOUS) ×4 IMPLANT

## 2016-01-29 NOTE — Progress Notes (Signed)
Pt ingested capsule at 0910; directions given; understanding verbalized; pt to return equipment tomorrow morning to front desk on her way to work

## 2016-01-30 ENCOUNTER — Encounter: Payer: Self-pay | Admitting: Internal Medicine

## 2016-01-30 ENCOUNTER — Other Ambulatory Visit (INDEPENDENT_AMBULATORY_CARE_PROVIDER_SITE_OTHER): Payer: PRIVATE HEALTH INSURANCE

## 2016-01-30 ENCOUNTER — Ambulatory Visit (INDEPENDENT_AMBULATORY_CARE_PROVIDER_SITE_OTHER): Payer: PRIVATE HEALTH INSURANCE | Admitting: Internal Medicine

## 2016-01-30 VITALS — BP 130/78 | HR 89 | Temp 98.2°F | Ht 63.0 in | Wt 266.0 lb

## 2016-01-30 DIAGNOSIS — D518 Other vitamin B12 deficiency anemias: Secondary | ICD-10-CM

## 2016-01-30 DIAGNOSIS — R739 Hyperglycemia, unspecified: Secondary | ICD-10-CM | POA: Diagnosis not present

## 2016-01-30 DIAGNOSIS — I10 Essential (primary) hypertension: Secondary | ICD-10-CM | POA: Diagnosis not present

## 2016-01-30 DIAGNOSIS — J301 Allergic rhinitis due to pollen: Secondary | ICD-10-CM

## 2016-01-30 LAB — CBC WITH DIFFERENTIAL/PLATELET
Basophils Absolute: 0 10*3/uL (ref 0.0–0.1)
Basophils Relative: 0.2 % (ref 0.0–3.0)
Eosinophils Absolute: 0.1 10*3/uL (ref 0.0–0.7)
Eosinophils Relative: 1.6 % (ref 0.0–5.0)
HCT: 35.6 % — ABNORMAL LOW (ref 36.0–46.0)
Hemoglobin: 11.6 g/dL — ABNORMAL LOW (ref 12.0–15.0)
Lymphocytes Relative: 24.1 % (ref 12.0–46.0)
Lymphs Abs: 2.1 10*3/uL (ref 0.7–4.0)
MCHC: 32.5 g/dL (ref 30.0–36.0)
MCV: 83.4 fl (ref 78.0–100.0)
Monocytes Absolute: 0.7 10*3/uL (ref 0.1–1.0)
Monocytes Relative: 7.5 % (ref 3.0–12.0)
Neutro Abs: 5.8 10*3/uL (ref 1.4–7.7)
Neutrophils Relative %: 66.6 % (ref 43.0–77.0)
Platelets: 370 10*3/uL (ref 150.0–400.0)
RBC: 4.26 Mil/uL (ref 3.87–5.11)
RDW: 16.3 % — ABNORMAL HIGH (ref 11.5–15.5)
WBC: 8.8 10*3/uL (ref 4.0–10.5)

## 2016-01-30 LAB — BASIC METABOLIC PANEL
BUN: 13 mg/dL (ref 6–23)
CO2: 32 mEq/L (ref 19–32)
Calcium: 9.9 mg/dL (ref 8.4–10.5)
Chloride: 102 mEq/L (ref 96–112)
Creatinine, Ser: 0.92 mg/dL (ref 0.40–1.20)
GFR: 79.98 mL/min (ref >60.00)
Glucose, Bld: 102 mg/dL — ABNORMAL HIGH (ref 70–99)
Potassium: 4 mEq/L (ref 3.5–5.1)
Sodium: 141 mEq/L (ref 135–145)

## 2016-01-30 LAB — HEMOGLOBIN A1C: Hgb A1c MFr Bld: 5.8 % (ref 4.6–6.5)

## 2016-01-30 LAB — VITAMIN B12: Vitamin B-12: 1155 pg/mL — ABNORMAL HIGH (ref 211–911)

## 2016-01-30 LAB — FOLATE: Folate: 18.6 ng/mL (ref >5.9)

## 2016-01-30 MED ORDER — LEVOCETIRIZINE DIHYDROCHLORIDE 5 MG PO TABS: 5.0000 mg | ORAL_TABLET | Freq: Every evening | ORAL | 3 refills | Status: DC

## 2016-01-30 MED ORDER — METHYLPREDNISOLONE ACETATE 80 MG/ML IJ SUSP
80.0000 mg | Freq: Once | INTRAMUSCULAR | Status: AC
  Administered 2016-01-30: 120 mg via INTRAMUSCULAR

## 2016-01-30 MED ORDER — AZELASTINE HCL 0.1 % NA SOLN: 1.0000 | Freq: Two times a day (BID) | NASAL | 12 refills | Status: DC

## 2016-01-30 NOTE — Patient Instructions (Signed)
Allergic Rhinitis Allergic rhinitis is when the mucous membranes in the nose respond to allergens. Allergens are particles in the air that cause your body to have an allergic reaction. This causes you to release allergic antibodies. Through a chain of events, these eventually cause you to release histamine into the blood stream. Although meant to protect the body, it is this release of histamine that causes your discomfort, such as frequent sneezing, congestion, and an itchy, runny nose. What are the causes? Seasonal allergic rhinitis (hay fever) is caused by pollen allergens that may come from grasses, trees, and weeds. Year-round allergic rhinitis (perennial allergic rhinitis) is caused by allergens such as house dust mites, pet dander, and mold spores. What are the signs or symptoms?  Nasal stuffiness (congestion).  Itchy, runny nose with sneezing and tearing of the eyes. How is this diagnosed? Your health care provider can help you determine the allergen or allergens that trigger your symptoms. If you and your health care provider are unable to determine the allergen, skin or blood testing may be used. Your health care provider will diagnose your condition after taking your health history and performing a physical exam. Your health care provider may assess you for other related conditions, such as asthma, pink eye, or an ear infection. How is this treated? Allergic rhinitis does not have a cure, but it can be controlled by:  Medicines that block allergy symptoms. These may include allergy shots, nasal sprays, and oral antihistamines.  Avoiding the allergen. Hay fever may often be treated with antihistamines in pill or nasal spray forms. Antihistamines block the effects of histamine. There are over-the-counter medicines that may help with nasal congestion and swelling around the eyes. Check with your health care provider before taking or giving this medicine. If avoiding the allergen or the  medicine prescribed do not work, there are many new medicines your health care provider can prescribe. Stronger medicine may be used if initial measures are ineffective. Desensitizing injections can be used if medicine and avoidance does not work. Desensitization is when a patient is given ongoing shots until the body becomes less sensitive to the allergen. Make sure you follow up with your health care provider if problems continue. Follow these instructions at home: It is not possible to completely avoid allergens, but you can reduce your symptoms by taking steps to limit your exposure to them. It helps to know exactly what you are allergic to so that you can avoid your specific triggers. Contact a health care provider if:  You have a fever.  You develop a cough that does not stop easily (persistent).  You have shortness of breath.  You start wheezing.  Symptoms interfere with normal daily activities. This information is not intended to replace advice given to you by your health care provider. Make sure you discuss any questions you have with your health care provider. Document Released: 09/17/2000 Document Revised: 08/24/2015 Document Reviewed: 08/30/2012 Elsevier Interactive Patient Education  2017 [employer].  

## 2016-01-30 NOTE — Progress Notes (Signed)
Subjective:  Patient ID: [name], female    DOB: 06/04/1955  Age: 61 y.o. MRN: [id number]  CC: Anemia; Hypertension; and Allergic Rhinitis    HPI [name] presents for follow-up on the above medical problems. She complains of chronic worsening sneezing, nasal congestion, runny nose, and postnasal drip. She has tried a steroid nasal spray with minimal symptom relief.  She has chronic iron deficiency anemia. She is undergoing a GI workup and is scheduled for capsule endoscopy in the near future. She does not notice any sources of blood loss and she denies paresthesias. She is compliant with iron replacement therapy.  Outpatient Medications Prior to Visit  Medication Sig Dispense Refill  . DULoxetine (CYMBALTA) 30 MG capsule TAKE 30 MG BY MOUTH  DAILY 90 capsule 3  . Iron-Folic Acid-B12-C-Docusate (FERRAPLUS 90) 90-1 MG TABS Take 1 tablet by mouth daily. 90 tablet 3  . mometasone (NASONEX) 50 MCG/ACT nasal spray Place 4 sprays into the nose daily. 17 g 3  . potassium chloride SA (K-DUR,KLOR-CON) 20 MEQ tablet Take 1 tablet (20 mEq total) by mouth daily. 90 tablet 1  . triamterene-hydrochlorothiazide (DYAZIDE) 37.5-25 MG capsule Take 1 each (1 capsule total) by mouth daily. Yearly physical w/labs due in June 90 capsule 1  . Vitamin D, Ergocalciferol, (DRISDOL) 50000 units CAPS capsule TAKE 1 CAPSULE ONCE EVERY WEEK 12 capsule 2  . montelukast (SINGULAIR) 10 MG tablet TAKE 1 TABLET (10 MG TOTAL) BY MOUTH AT BEDTIME. 90 tablet 2   No facility-administered medications prior to visit.     ROS Review of Systems  Constitutional: Negative for chills, diaphoresis, fatigue and fever.  HENT: Positive for congestion, postnasal drip, rhinorrhea and sneezing. Negative for facial swelling, nosebleeds, sinus pain, sinus pressure, sore throat and tinnitus.   Eyes: Negative.  Negative for visual disturbance.  Respiratory: Negative for cough, chest tightness, shortness of breath and wheezing.     Cardiovascular: Negative for chest pain, palpitations and leg swelling.  Gastrointestinal: Negative for abdominal pain, constipation, diarrhea, nausea and vomiting.  Endocrine: Negative.   Genitourinary: Negative.  Negative for difficulty urinating.  Musculoskeletal: Negative.  Negative for back pain, myalgias and neck pain.  Skin: Negative.  Negative for pallor and rash.  Allergic/Immunologic: Negative.   Neurological: Negative.  Negative for dizziness, syncope, weakness and numbness.  Hematological: Negative for adenopathy. Does not bruise/bleed easily.  Psychiatric/Behavioral: Negative.     Objective:  BP 130/78 (BP Location: Left Arm, Patient Position: Sitting, Cuff Size: Large)   Pulse 89   Temp 98.2 F (36.8 C) (Oral)   Ht 5\' 3"  (1.6 m)   Wt 266 lb (120.7 kg)   SpO2 96%   BMI 47.12 kg/m   BP Readings from Last 3 Encounters:  01/30/16 130/78  12/21/15 112/72  10/23/15 124/74    Wt Readings from Last 3 Encounters:  01/30/16 266 lb (120.7 kg)  01/29/16 266 lb (120.7 kg)  12/21/15 269 lb (122 kg)    Physical Exam  Constitutional: She is oriented to person, place, and time. No distress.  HENT:  Nose: Mucosal edema present. No rhinorrhea or sinus tenderness. No epistaxis. Right sinus exhibits no maxillary sinus tenderness and no frontal sinus tenderness. Left sinus exhibits no maxillary sinus tenderness and no frontal sinus tenderness.  Mouth/Throat: Oropharynx is clear and moist. No oropharyngeal exudate.  Eyes: Conjunctivae are normal. Right eye exhibits no discharge. Left eye exhibits no discharge. No scleral icterus.  Neck: Normal range of motion. Neck supple. No JVD present.  No tracheal deviation present. No thyromegaly present.  Cardiovascular: Normal rate, regular rhythm, normal heart sounds and intact distal pulses.  Exam reveals no gallop and no friction rub.   No murmur heard. Pulmonary/Chest: Effort normal and breath sounds normal. No stridor. No respiratory  distress. She has no wheezes. She has no rales. She exhibits no tenderness.  Abdominal: Soft. Bowel sounds are normal. She exhibits no distension and no mass. There is no tenderness. There is no rebound and no guarding.  Musculoskeletal: Normal range of motion. She exhibits no edema, tenderness or deformity.  Neurological: She is oriented to person, place, and time.  Skin: Skin is warm and dry. No rash noted. She is not diaphoretic. No erythema. No pallor.    Lab Results  Component Value Date   WBC 8.8 01/30/2016   HGB 11.6 (L) 01/30/2016   HCT 35.6 (L) 01/30/2016   PLT 370.0 01/30/2016   GLUCOSE 102 (H) 01/30/2016   CHOL 187 04/26/2015   TRIG 129.0 04/26/2015   HDL 50.30 04/26/2015   LDLCALC 111 (H) 04/26/2015   ALT 24 04/26/2015   AST 15 04/26/2015   NA 141 01/30/2016   K 4.0 01/30/2016   CL 102 01/30/2016   CREATININE 0.92 01/30/2016   BUN 13 01/30/2016   CO2 32 01/30/2016   TSH 1.37 04/26/2015   HGBA1C 5.8 01/30/2016    No results found.  Assessment & Plan:   [name] was seen today for anemia, hypertension and allergic rhinitis .  Diagnoses and all orders for this visit:  Other vitamin B12 deficiency anemia- Her B12 level is normal now and the anemia is starting to improve. -     CBC with Differential/Platelet; Future -     Vitamin B12; Future -     Folate; Future  Hyperglycemia- her A1c is down to 5.8%, improvement noted, no medications needed at this time, she will continue to work on her lifestyle modifications. -     Hemoglobin A1c; Future  Essential hypertension- her blood pressures adequately well-controlled, electrolytes and renal function are normal. -     Basic metabolic panel; Future  Acute seasonal allergic rhinitis due to pollen- she is having a severe flare so I gave her an injection of Depo-Medrol in the office today. I've also asked her to start an oral antihistamine and an antihistamine nasal spray in addition to the steroid nasal spray. -      levocetirizine (XYZAL) 5 MG tablet; Take 1 tablet (5 mg total) by mouth every evening. -     azelastine (ASTELIN) 0.1 % nasal spray; Place 1 spray into both nostrils 2 (two) times daily. Use in each nostril as directed -     methylPREDNISolone acetate (DEPO-MEDROL) injection 80 mg; Inject 1 mL (80 mg total) into the muscle once.   I have discontinued Ms. Hernandez's montelukast. I am also having her start on levocetirizine and azelastine. Additionally, I am having her maintain her DULoxetine, potassium chloride SA, Vitamin D (Ergocalciferol), mometasone, FERRAPLUS 90, and triamterene-hydrochlorothiazide. We administered methylPREDNISolone acetate.  Meds ordered this encounter  Medications  . levocetirizine (XYZAL) 5 MG tablet    Sig: Take 1 tablet (5 mg total) by mouth every evening.    Dispense:  90 tablet    Refill:  3  . azelastine (ASTELIN) 0.1 % nasal spray    Sig: Place 1 spray into both nostrils 2 (two) times daily. Use in each nostril as directed    Dispense:  30 mL    Refill:  12  . methylPREDNISolone acetate (DEPO-MEDROL) injection 80 mg     Follow-up: Return in about 3 months (around 04/29/2016).  [name], MD

## 2016-01-30 NOTE — Progress Notes (Signed)
Pre visit review using our clinic review tool, if applicable. No additional management support is needed unless otherwise documented below in the visit note. 

## 2016-01-31 ENCOUNTER — Encounter (HOSPITAL_COMMUNITY): Payer: Self-pay | Admitting: Gastroenterology

## 2016-02-14 ENCOUNTER — Other Ambulatory Visit: Payer: Self-pay | Admitting: Internal Medicine

## 2016-02-14 DIAGNOSIS — E559 Vitamin D deficiency, unspecified: Secondary | ICD-10-CM

## 2016-04-26 ENCOUNTER — Other Ambulatory Visit: Payer: Self-pay | Admitting: Internal Medicine

## 2016-04-29 ENCOUNTER — Ambulatory Visit (INDEPENDENT_AMBULATORY_CARE_PROVIDER_SITE_OTHER): Payer: PRIVATE HEALTH INSURANCE | Admitting: Internal Medicine

## 2016-04-29 ENCOUNTER — Other Ambulatory Visit (INDEPENDENT_AMBULATORY_CARE_PROVIDER_SITE_OTHER): Payer: PRIVATE HEALTH INSURANCE

## 2016-04-29 ENCOUNTER — Encounter: Payer: Self-pay | Admitting: Internal Medicine

## 2016-04-29 VITALS — BP 136/76 | HR 81 | Temp 98.5°F | Resp 16 | Ht 63.0 in | Wt 270.0 lb

## 2016-04-29 DIAGNOSIS — I10 Essential (primary) hypertension: Secondary | ICD-10-CM | POA: Diagnosis not present

## 2016-04-29 DIAGNOSIS — J301 Allergic rhinitis due to pollen: Secondary | ICD-10-CM | POA: Diagnosis not present

## 2016-04-29 DIAGNOSIS — D518 Other vitamin B12 deficiency anemias: Secondary | ICD-10-CM

## 2016-04-29 DIAGNOSIS — E785 Hyperlipidemia, unspecified: Secondary | ICD-10-CM

## 2016-04-29 LAB — LIPID PANEL
Cholesterol: 194 mg/dL (ref 0–200)
HDL: 54.9 mg/dL (ref >39.00)
LDL Cholesterol: 114 mg/dL — ABNORMAL HIGH (ref 0–99)
NonHDL: 138.63
Total CHOL/HDL Ratio: 4
Triglycerides: 122 mg/dL (ref 0.0–149.0)
VLDL: 24.4 mg/dL (ref 0.0–40.0)

## 2016-04-29 LAB — CBC WITH DIFFERENTIAL/PLATELET
Basophils Absolute: 0.1 10*3/uL (ref 0.0–0.1)
Basophils Relative: 0.7 % (ref 0.0–3.0)
Eosinophils Absolute: 0.2 10*3/uL (ref 0.0–0.7)
Eosinophils Relative: 1.7 % (ref 0.0–5.0)
HCT: 35.3 % — ABNORMAL LOW (ref 36.0–46.0)
Hemoglobin: 11.6 g/dL — ABNORMAL LOW (ref 12.0–15.0)
Lymphocytes Relative: 23.1 % (ref 12.0–46.0)
Lymphs Abs: 2.2 10*3/uL (ref 0.7–4.0)
MCHC: 32.9 g/dL (ref 30.0–36.0)
MCV: 84.8 fl (ref 78.0–100.0)
Monocytes Absolute: 0.8 10*3/uL (ref 0.1–1.0)
Monocytes Relative: 8 % (ref 3.0–12.0)
Neutro Abs: 6.5 10*3/uL (ref 1.4–7.7)
Neutrophils Relative %: 66.5 % (ref 43.0–77.0)
Platelets: 333 10*3/uL (ref 150.0–400.0)
RBC: 4.16 Mil/uL (ref 3.87–5.11)
RDW: 15.8 % — ABNORMAL HIGH (ref 11.5–15.5)
WBC: 9.7 10*3/uL (ref 4.0–10.5)

## 2016-04-29 LAB — BASIC METABOLIC PANEL
BUN: 16 mg/dL (ref 6–23)
CO2: 30 mEq/L (ref 19–32)
Calcium: 9.5 mg/dL (ref 8.4–10.5)
Chloride: 102 mEq/L (ref 96–112)
Creatinine, Ser: 0.8 mg/dL (ref 0.40–1.20)
GFR: 93.9 mL/min (ref >60.00)
Glucose, Bld: 94 mg/dL (ref 70–99)
Potassium: 3.6 mEq/L (ref 3.5–5.1)
Sodium: 139 mEq/L (ref 135–145)

## 2016-04-29 MED ORDER — LEVOCETIRIZINE DIHYDROCHLORIDE 5 MG PO TABS: 5.0000 mg | ORAL_TABLET | Freq: Every evening | ORAL | 3 refills | Status: DC

## 2016-04-29 MED ORDER — METHYLPREDNISOLONE 4 MG PO TBPK: ORAL_TABLET | ORAL | 0 refills | Status: DC

## 2016-04-29 MED ORDER — MONTELUKAST SODIUM 10 MG PO TABS: 10.0000 mg | ORAL_TABLET | Freq: Every day | ORAL | 3 refills | Status: DC

## 2016-04-29 MED ORDER — MOMETASONE FUROATE 50 MCG/ACT NA SUSP: 4.0000 | Freq: Every day | NASAL | 3 refills | Status: DC

## 2016-04-29 NOTE — Progress Notes (Signed)
Pre visit review using our clinic review tool, if applicable. No additional management support is needed unless otherwise documented below in the visit note. 

## 2016-04-29 NOTE — Patient Instructions (Signed)

## 2016-04-29 NOTE — Progress Notes (Signed)
Subjective:  Patient ID: [name], female    DOB: November 10, 1955  Age: 61 y.o. MRN: [id number]  CC: Hyperlipidemia; Hypertension; Anemia; and Allergic Rhinitis    HPI [name] presents for f/up - She complains of severe nasal congestion and sneezing. She has been taking Xyzal but has not been using a steroid nasal spray. She tells me her blood pressure has been well controlled.  Outpatient Medications Prior to Visit  Medication Sig Dispense Refill  . azelastine (ASTELIN) 0.1 % nasal spray Place 1 spray into both nostrils 2 (two) times daily. Use in each nostril as directed 30 mL 12  . DULoxetine (CYMBALTA) 30 MG capsule TAKE 30 MG BY MOUTH  DAILY 90 capsule 3  . Iron-Folic Acid-B12-C-Docusate (FERRAPLUS 90) 90-1 MG TABS Take 1 tablet by mouth daily. 90 tablet 3  . potassium chloride SA (K-DUR,KLOR-CON) 20 MEQ tablet Take 1 tablet (20 mEq total) by mouth daily. 90 tablet 1  . triamterene-hydrochlorothiazide (MAXZIDE-25) 37.5-25 MG tablet TAKE 1 TABLET BY MOUTH DAILY 90 tablet 0  . Vitamin D, Ergocalciferol, (DRISDOL) 50000 units CAPS capsule TAKE 1 CAPSULE EVERY WEEK 12 capsule 2  . levocetirizine (XYZAL) 5 MG tablet Take 1 tablet (5 mg total) by mouth every evening. 90 tablet 3  . mometasone (NASONEX) 50 MCG/ACT nasal spray Place 4 sprays into the nose daily. 17 g 3  . triamterene-hydrochlorothiazide (DYAZIDE) 37.5-25 MG capsule Take 1 each (1 capsule total) by mouth daily. Yearly physical w/labs due in June 90 capsule 1   No facility-administered medications prior to visit.     ROS Review of Systems  Constitutional: Negative.  Negative for appetite change, diaphoresis and fatigue.  HENT: Positive for congestion, postnasal drip and rhinorrhea. Negative for facial swelling, nosebleeds, sinus pain, sinus pressure, sneezing, sore throat, tinnitus, trouble swallowing and voice change.   Eyes: Negative.   Respiratory: Negative.  Negative for cough, chest tightness, shortness of breath  and wheezing.   Cardiovascular: Negative for chest pain, palpitations and leg swelling.  Gastrointestinal: Negative for abdominal pain, constipation, diarrhea, nausea and vomiting.  Endocrine: Negative.   Genitourinary: Negative.  Negative for difficulty urinating.  Musculoskeletal: Negative.   Skin: Negative.   Allergic/Immunologic: Negative.   Neurological: Negative.  Negative for dizziness and weakness.  Hematological: Negative for adenopathy. Does not bruise/bleed easily.  Psychiatric/Behavioral: Negative.     Objective:  BP 136/76 (BP Location: Left Arm, Patient Position: Sitting, Cuff Size: Large)   Pulse 81   Temp 98.5 F (36.9 C) (Oral)   Resp 16   Ht  (1.6 m)   Wt 270 lb (122.5 kg)   SpO2 98%   BMI 47.83 kg/m   BP Readings from Last 3 Encounters:  04/29/16 136/76  01/30/16 130/78  12/21/15 112/72    Wt Readings from Last 3 Encounters:  04/29/16 270 lb (122.5 kg)  01/30/16 266 lb (120.7 kg)  01/29/16 266 lb (120.7 kg)    Physical Exam  Constitutional: No distress.  HENT:  Nose: Mucosal edema and rhinorrhea present. No sinus tenderness. No epistaxis. Right sinus exhibits no maxillary sinus tenderness and no frontal sinus tenderness. Left sinus exhibits no maxillary sinus tenderness and no frontal sinus tenderness.  Mouth/Throat: Oropharynx is clear and moist and mucous membranes are normal. Mucous membranes are not pale, not dry and not cyanotic. No oral lesions. No trismus in the jaw. No uvula swelling. No oropharyngeal exudate, posterior oropharyngeal edema, posterior oropharyngeal erythema or tonsillar abscesses.  Eyes: Conjunctivae are normal. Right  eye exhibits no discharge. Left eye exhibits no discharge. No scleral icterus.  Neck: Normal range of motion. Neck supple. No JVD present. No tracheal deviation present. No thyromegaly present.  Cardiovascular: Normal rate, regular rhythm, normal heart sounds and intact distal pulses.  Exam reveals no gallop and  no friction rub.   No murmur heard. Pulmonary/Chest: Effort normal and breath sounds normal. No stridor. No respiratory distress. She has no wheezes. She has no rales. She exhibits no tenderness.  Abdominal: Soft. Bowel sounds are normal. She exhibits no distension and no mass. There is no tenderness. There is no rebound and no guarding.  Lymphadenopathy:    She has no cervical adenopathy.  Skin: She is not diaphoretic.  Vitals reviewed.   Lab Results  Component Value Date   WBC 9.7 04/29/2016   HGB 11.6 (L) 04/29/2016   HCT 35.3 (L) 04/29/2016   PLT 333.0 04/29/2016   GLUCOSE 94 04/29/2016   CHOL 194 04/29/2016   TRIG 122.0 04/29/2016   HDL 54.90 04/29/2016   LDLCALC 114 (H) 04/29/2016   ALT 24 04/26/2015   AST 15 04/26/2015   NA 139 04/29/2016   K 3.6 04/29/2016   CL 102 04/29/2016   CREATININE 0.80 04/29/2016   BUN 16 04/29/2016   CO2 30 04/29/2016   TSH 1.37 04/26/2015   HGBA1C 5.8 01/30/2016    No results found.  Assessment & Plan:   [name] was seen today for hyperlipidemia, hypertension, anemia and allergic rhinitis .  Diagnoses and all orders for this visit:  Essential hypertension- Her blood pressures adequately well-controlled, electrolytes and renal function are normal. -     Basic metabolic panel; Future  Other vitamin B12 deficiency anemia- despite B12 replacement therapy she remains mildly anemic. This most more like anemia of chronic disease, I will continue to follow this in the future. -     CBC with Differential/Platelet; Future  Hyperlipidemia with target LDL less than 130- her Framingham risk score is only 3% so I do not recommend she take a statin for cardiovascular risk reduction. -     Lipid panel; Future  Seasonal allergic rhinitis due to pollen- she is having a flareup of symptoms so I have advised her to take a Medrol dose pack as well as restarting a steroid nasal spray and Singulair, will continue Xyzal. -     mometasone (NASONEX) 50  MCG/ACT nasal spray; Place 4 sprays into the nose daily. -     montelukast (SINGULAIR) 10 MG tablet; Take 1 tablet (10 mg total) by mouth at bedtime. -     levocetirizine (XYZAL) 5 MG tablet; Take 1 tablet (5 mg total) by mouth every evening. -     methylPREDNISolone (MEDROL DOSEPAK) 4 MG TBPK tablet; TAKE AS DIRECTED   I have discontinued Ms. Steinhoff's levocetirizine. I have also changed her montelukast. Additionally, I am having her start on levocetirizine and methylPREDNISolone. Lastly, I am having her maintain her DULoxetine, potassium chloride SA, FERRAPLUS 90, azelastine, Vitamin D (Ergocalciferol), triamterene-hydrochlorothiazide, CVS VITAMIN B12, and mometasone.  Meds ordered this encounter  Medications  . CVS VITAMIN B12 2000 MCG tablet    Sig: Take 2,000 mcg by mouth daily.    Refill:  3  . DISCONTD: montelukast (SINGULAIR) 10 MG tablet  . mometasone (NASONEX) 50 MCG/ACT nasal spray    Sig: Place 4 sprays into the nose daily.    Dispense:  51 g    Refill:  3    Dispense 90 day supply  please  . montelukast (SINGULAIR) 10 MG tablet    Sig: Take 1 tablet (10 mg total) by mouth at bedtime.    Dispense:  90 tablet    Refill:  3  . levocetirizine (XYZAL) 5 MG tablet    Sig: Take 1 tablet (5 mg total) by mouth every evening.    Dispense:  90 tablet    Refill:  3  . methylPREDNISolone (MEDROL DOSEPAK) 4 MG TBPK tablet    Sig: TAKE AS DIRECTED    Dispense:  21 tablet    Refill:  0     Follow-up: Return in about 6 months (around 10/29/2016).  [name], MD

## 2016-04-30 ENCOUNTER — Encounter: Payer: Self-pay | Admitting: Internal Medicine

## 2016-05-05 ENCOUNTER — Other Ambulatory Visit: Payer: Self-pay | Admitting: Internal Medicine

## 2016-05-05 DIAGNOSIS — F418 Other specified anxiety disorders: Secondary | ICD-10-CM

## 2016-06-04 ENCOUNTER — Encounter: Payer: Self-pay | Admitting: Nurse Practitioner

## 2016-06-04 ENCOUNTER — Ambulatory Visit (INDEPENDENT_AMBULATORY_CARE_PROVIDER_SITE_OTHER): Payer: PRIVATE HEALTH INSURANCE | Admitting: Nurse Practitioner

## 2016-06-04 VITALS — BP 122/74 | HR 90 | Temp 98.6°F | Wt 270.0 lb

## 2016-06-04 DIAGNOSIS — R600 Localized edema: Secondary | ICD-10-CM | POA: Diagnosis not present

## 2016-06-04 DIAGNOSIS — I10 Essential (primary) hypertension: Secondary | ICD-10-CM | POA: Diagnosis not present

## 2016-06-04 MED ORDER — TRIAMTERENE-HCTZ 37.5-25 MG PO CAPS: 1.0000 | ORAL_CAPSULE | Freq: Every day | ORAL | 0 refills | Status: DC

## 2016-06-04 NOTE — Progress Notes (Signed)
Subjective:  Patient ID: [name], female    DOB: [birth year]  Age: 61 y.o. MRN: [id number]  CC: Leg Swelling (legs swelling 1 mo, thinking it might be the form of medication (Capsule to Tab))  HPI  Edema: [name] presents with bilateral LE edema. Onset 64month ago. Swelling improves with elevation and worsens with prolonged sitting and standing. Denies any recent travel or surgical procedures that needed prolonged immobilization. Denies any injury. Onset of symptoms after pharmacy switched medication from capsule to tablet. She also complete oral prednisone dose pack about the same of symptom onset. She will like medication switched to capsule [facility].  Outpatient Medications Prior to Visit  Medication Sig Dispense Refill  . azelastine (ASTELIN) 0.1 % nasal spray Place 1 spray into both nostrils 2 (two) times daily. Use in each nostril as directed 30 mL 12  . CVS VITAMIN B12 2000 MCG tablet Take 2,000 mcg by mouth daily.  3  . DULoxetine (CYMBALTA) 30 MG capsule TAKE 1 CAPSULE DAILY 90 capsule 3  . Iron-Folic Acid-B12-C-Docusate (FERRAPLUS 90) 90-1 MG TABS Take 1 tablet by mouth daily. 90 tablet 3  . levocetirizine (XYZAL) 5 MG tablet Take 1 tablet (5 mg total) by mouth every evening. 90 tablet 3  . mometasone (NASONEX) 50 MCG/ACT nasal spray Place 4 sprays into the nose daily. 51 g 3  . montelukast (SINGULAIR) 10 MG tablet Take 1 tablet (10 mg total) by mouth at bedtime. 90 tablet 3  . potassium chloride SA (K-DUR,KLOR-CON) 20 MEQ tablet Take 1 tablet (20 mEq total) by mouth daily. 90 tablet 1  . Vitamin D, Ergocalciferol, (DRISDOL) 50000 units CAPS capsule TAKE 1 CAPSULE EVERY WEEK 12 capsule 2  . methylPREDNISolone (MEDROL DOSEPAK) 4 MG TBPK tablet TAKE AS DIRECTED 21 tablet 0  . triamterene-hydrochlorothiazide (MAXZIDE-25) 37.5-25 MG tablet TAKE 1 TABLET BY MOUTH DAILY 90 tablet 0   No facility-administered medications prior to visit.      ROS Review of Systems  Constitutional: Negative.   Respiratory: Negative for cough, sputum production and shortness of breath.   Cardiovascular: Positive for leg swelling. Negative for chest pain, palpitations, orthopnea and PND.  Gastrointestinal: Negative for abdominal pain, constipation and nausea.  Musculoskeletal: Negative for falls.  Skin: Negative.     Objective:  BP 122/74   Pulse 90   Temp 98.6 F (37 C)   Wt 270 lb (122.5 kg)   SpO2 97%   BMI 47.83 kg/m   BP Readings from Last 3 Encounters:  06/04/16 122/74  04/29/16 136/76  01/30/16 130/78    Wt Readings from Last 3 Encounters:  06/04/16 270 lb (122.5 kg)  04/29/16 270 lb (122.5 kg)  01/30/16 266 lb (120.7 kg)    Physical Exam  Constitutional: She is oriented to person, place, and time. No distress.  Cardiovascular: Normal rate, regular rhythm and normal heart sounds.   Pulmonary/Chest: Effort normal and breath sounds normal.  Abdominal: Soft. Bowel sounds are normal.  Musculoskeletal: She exhibits edema.  Neurological: She is alert and oriented to person, place, and time.  Skin: Skin is warm and dry. No rash noted. No erythema.  Vitals reviewed.   Lab Results  Component Value Date   WBC 9.7 04/29/2016   HGB 11.6 (L) 04/29/2016   HCT 35.3 (L) 04/29/2016   PLT 333.0 04/29/2016   GLUCOSE 94 04/29/2016   CHOL 194 04/29/2016   TRIG 122.0 04/29/2016   HDL 54.90 04/29/2016   LDLCALC  114 (H) 04/29/2016   ALT 24 04/26/2015   AST 15 04/26/2015   NA 139 04/29/2016   K 3.6 04/29/2016   CL 102 04/29/2016   CREATININE 0.80 04/29/2016   BUN 16 04/29/2016   CO2 30 04/29/2016   TSH 1.37 04/26/2015   HGBA1C 5.8 01/30/2016    No results found.  Assessment & Plan:   Sandy NixonJanice was seen today for leg swelling.  Diagnoses and all orders for this visit:  Essential hypertension -     triamterene-hydrochlorothiazide (DYAZIDE) 37.5-25 MG capsule; Take 1 each (1 capsule total) by mouth daily.   I have  discontinued [name]'s triamterene-hydrochlorothiazide and methylPREDNISolone. I am also having her start on triamterene-hydrochlorothiazide. Additionally, I am having her maintain her potassium chloride SA, FERRAPLUS 90, azelastine, Vitamin D (Ergocalciferol), CVS VITAMIN B12, mometasone, montelukast, levocetirizine, DULoxetine, and MILK THISTLE PO.  Meds ordered this encounter  Medications  . MILK THISTLE PO    Sig: Take by mouth.  . triamterene-hydrochlorothiazide (DYAZIDE) 37.5-25 MG capsule    Sig: Take 1 each (1 capsule total) by mouth daily.    Dispense:  30 capsule    Refill:  0    Order Specific Question:   Supervising Provider    Answer:   Tresa GarterPLOTNIKOV, ALEKSEI V [[id number]    Follow-up: Return if symptoms worsen or fail to improve.  Alysia Pennaharlotte Kiera Hussey, NP

## 2016-06-04 NOTE — Patient Instructions (Signed)
Use compression stocking during the day and off at night.  please maintain low sodium diet.  DASH Eating Plan DASH stands for "Dietary Approaches to Stop Hypertension." The DASH eating plan is a healthy eating plan that has been shown to reduce high blood pressure (hypertension). It may also reduce your risk for type 2 diabetes, heart disease, and stroke. The DASH eating plan may also help with weight loss. What are tips for following this plan? General guidelines   Avoid eating more than 2,300 mg (milligrams) of salt (sodium) a day. If you have hypertension, you may need to reduce your sodium intake to 1,500 mg a day.  Limit alcohol intake to no more than 1 drink a day for nonpregnant women and 2 drinks a day for men. One drink equals 12 oz of beer, 5 oz of wine, or 1 oz of hard liquor.  Work with your health care provider to maintain a healthy body weight or to lose weight. Ask what an ideal weight is for you.  Get at least 30 minutes of exercise that causes your heart to beat faster (aerobic exercise) most days of the week. Activities may include walking, swimming, or biking.  Work with your health care provider or diet and nutrition specialist (dietitian) to adjust your eating plan to your individual calorie needs. Reading food labels   Check food labels for the amount of sodium per serving. Choose foods with less than 5 percent of the Daily Value of sodium. Generally, foods with less than 300 mg of sodium per serving fit into this eating plan.  To find whole grains, look for the word "whole" as the first word in the ingredient list. Shopping   Buy products labeled as "low-sodium" or "no salt added."  Buy fresh foods. Avoid canned foods and premade or frozen meals. Cooking   Avoid adding salt when cooking. Use salt-free seasonings or herbs instead of table salt or sea salt. Check with your health care provider or pharmacist before using salt substitutes.  Do not fry foods. Cook  foods using healthy methods such as baking, boiling, grilling, and broiling instead.  Cook with heart-healthy oils, such as olive, canola, soybean, or sunflower oil. Meal planning    Eat a balanced diet that includes:  5 or more servings of fruits and vegetables each day. At each meal, try to fill half of your plate with fruits and vegetables.  Up to 6-8 servings of whole grains each day.  Less than 6 oz of lean meat, poultry, or fish each day. A 3-oz serving of meat is about the same size as a deck of cards. One egg equals 1 oz.  2 servings of low-fat dairy each day.  A serving of nuts, seeds, or beans 5 times each week.  Heart-healthy fats. Healthy fats called Omega-3 fatty acids are found in foods such as flaxseeds and coldwater fish, like sardines, salmon, and mackerel.  Limit how much you eat of the following:  Canned or prepackaged foods.  Food that is high in trans fat, such as fried foods.  Food that is high in saturated fat, such as fatty meat.  Sweets, desserts, sugary drinks, and other foods with added sugar.  Full-fat dairy products.  Do not salt foods before eating.  Try to eat at least 2 vegetarian meals each week.  Eat more home-cooked food and less restaurant, buffet, and fast food.  When eating at a restaurant, ask that your food be prepared with less salt or no  salt, if possible. What foods are recommended? The items listed may not be a complete list. Talk with your dietitian about what dietary choices are best for you. Grains  Whole-grain or whole-wheat bread. Whole-grain or whole-wheat pasta. Brown rice. Orpah Cobb. Bulgur. Whole-grain and low-sodium cereals. Pita bread. Low-fat, low-sodium crackers. Whole-wheat flour tortillas. Vegetables  Fresh or frozen vegetables (raw, steamed, roasted, or grilled). Low-sodium or reduced-sodium tomato and vegetable juice. Low-sodium or reduced-sodium tomato sauce and tomato paste. Low-sodium or reduced-sodium  canned vegetables. Fruits  All fresh, dried, or frozen fruit. Canned fruit in natural juice (without added sugar). Meat and other protein foods  Skinless chicken or [place]. Ground chicken or [place]. Pork with fat trimmed off. Fish and seafood. Egg whites. Dried beans, peas, or lentils. Unsalted nuts, nut butters, and seeds. Unsalted canned beans. Lean cuts of beef with fat trimmed off. Low-sodium, lean deli meat. Dairy  Low-fat (1%) or fat-free (skim) milk. Fat-free, low-fat, or reduced-fat cheeses. Nonfat, low-sodium ricotta or cottage cheese. Low-fat or nonfat yogurt. Low-fat, low-sodium cheese. Fats and oils  Soft margarine without trans fats. Vegetable oil. Low-fat, reduced-fat, or light mayonnaise and salad dressings (reduced-sodium). Canola, safflower, olive, soybean, and sunflower oils. Avocado. Seasoning and other foods  Herbs. Spices. Seasoning mixes without salt. Unsalted popcorn and pretzels. Fat-free sweets. What foods are not recommended? The items listed may not be a complete list. Talk with your dietitian about what dietary choices are best for you. Grains  Baked goods made with fat, such as croissants, muffins, or some breads. Dry pasta or rice meal packs. Vegetables  Creamed or fried vegetables. Vegetables in a cheese sauce. Regular canned vegetables (not low-sodium or reduced-sodium). Regular canned tomato sauce and paste (not low-sodium or reduced-sodium). Regular tomato and vegetable juice (not low-sodium or reduced-sodium). Rosita Fire. Olives. Fruits  Canned fruit in a light or heavy syrup. Fried fruit. Fruit in cream or butter sauce. Meat and other protein foods  Fatty cuts of meat. Ribs. Fried meat. Tomasa Blase. Sausage. Bologna and other processed lunch meats. Salami. Fatback. Hotdogs. Bratwurst. Salted nuts and seeds. Canned beans with added salt. Canned or smoked fish. Whole eggs or egg yolks. Chicken or [place] with skin. Dairy  Whole or 2% milk, cream, and half-and-half. Whole  or full-fat cream cheese. Whole-fat or sweetened yogurt. Full-fat cheese. Nondairy creamers. Whipped toppings. Processed cheese and cheese spreads. Fats and oils  Butter. Stick margarine. Lard. Shortening. Ghee. Bacon fat. Tropical oils, such as coconut, palm kernel, or palm oil. Seasoning and other foods  Salted popcorn and pretzels. Onion salt, garlic salt, seasoned salt, table salt, and sea salt. Worcestershire sauce. Tartar sauce. Barbecue sauce. Teriyaki sauce. Soy sauce, including reduced-sodium. Steak sauce. Canned and packaged gravies. Fish sauce. Oyster sauce. Cocktail sauce. Horseradish that you find on the shelf. Ketchup. Mustard. Meat flavorings and tenderizers. Bouillon cubes. Hot sauce and Tabasco sauce. Premade or packaged marinades. Premade or packaged taco seasonings. Relishes. Regular salad dressings. Where to find more information:  National Heart, Lung, and Blood Institute: PopSteam.is  American Heart Association: www.heart.org Summary  The DASH eating plan is a healthy eating plan that has been shown to reduce high blood pressure (hypertension). It may also reduce your risk for type 2 diabetes, heart disease, and stroke.  With the DASH eating plan, you should limit salt (sodium) intake to 2,300 mg a day. If you have hypertension, you may need to reduce your sodium intake to 1,500 mg a day.  When on the DASH eating plan, aim to  eat more fresh fruits and vegetables, whole grains, lean proteins, low-fat dairy, and heart-healthy fats.  Work with your health care provider or diet and nutrition specialist (dietitian) to adjust your eating plan to your individual calorie needs. This information is not intended to replace advice given to you by your health care provider. Make sure you discuss any questions you have with your health care provider. Document Released: 12/12/2010 Document Revised: 12/17/2015 Document Reviewed: 12/17/2015 Elsevier Interactive Patient Education   2017 [employer].

## 2016-06-05 ENCOUNTER — Other Ambulatory Visit: Payer: Self-pay | Admitting: Nurse Practitioner

## 2016-06-05 DIAGNOSIS — I10 Essential (primary) hypertension: Secondary | ICD-10-CM

## 2016-06-05 NOTE — Telephone Encounter (Signed)
Please advise 

## 2016-07-23 ENCOUNTER — Encounter (INDEPENDENT_AMBULATORY_CARE_PROVIDER_SITE_OTHER): Payer: PRIVATE HEALTH INSURANCE | Admitting: Ophthalmology

## 2016-07-23 DIAGNOSIS — H33303 Unspecified retinal break, bilateral: Secondary | ICD-10-CM | POA: Diagnosis not present

## 2016-07-23 DIAGNOSIS — H35413 Lattice degeneration of retina, bilateral: Secondary | ICD-10-CM | POA: Diagnosis not present

## 2016-07-23 DIAGNOSIS — H2513 Age-related nuclear cataract, bilateral: Secondary | ICD-10-CM | POA: Diagnosis not present

## 2016-07-23 DIAGNOSIS — I10 Essential (primary) hypertension: Secondary | ICD-10-CM | POA: Diagnosis not present

## 2016-07-23 DIAGNOSIS — H35033 Hypertensive retinopathy, bilateral: Secondary | ICD-10-CM

## 2016-07-23 DIAGNOSIS — H43813 Vitreous degeneration, bilateral: Secondary | ICD-10-CM

## 2016-07-28 ENCOUNTER — Encounter (INDEPENDENT_AMBULATORY_CARE_PROVIDER_SITE_OTHER): Payer: PRIVATE HEALTH INSURANCE | Admitting: Ophthalmology

## 2016-07-28 DIAGNOSIS — H33301 Unspecified retinal break, right eye: Secondary | ICD-10-CM

## 2016-08-01 ENCOUNTER — Other Ambulatory Visit: Payer: Self-pay | Admitting: *Deleted

## 2016-08-01 DIAGNOSIS — G5601 Carpal tunnel syndrome, right upper limb: Secondary | ICD-10-CM

## 2016-08-05 ENCOUNTER — Other Ambulatory Visit: Payer: Self-pay | Admitting: *Deleted

## 2016-08-05 DIAGNOSIS — G5601 Carpal tunnel syndrome, right upper limb: Secondary | ICD-10-CM

## 2016-08-07 ENCOUNTER — Ambulatory Visit (INDEPENDENT_AMBULATORY_CARE_PROVIDER_SITE_OTHER): Payer: PRIVATE HEALTH INSURANCE | Admitting: Neurology

## 2016-08-07 DIAGNOSIS — M5412 Radiculopathy, cervical region: Secondary | ICD-10-CM

## 2016-08-07 DIAGNOSIS — G5601 Carpal tunnel syndrome, right upper limb: Secondary | ICD-10-CM

## 2016-08-07 NOTE — Procedures (Signed)
[facility]  [address], [address]  [place], [place] [address] Tel: [phone] Fax:  [phone] Test Date:  08/07/2016  Patient: Velora MediateJanice Fritze DOB: 04/13/1955 Physician: Nita Sickleonika Patel, DO  Sex: Female Height: 5\' 3"  Ref Phys: Frederico Hammananiel Caffrey, MD  ID#: [id number] Temp: 35.1C Technician:    Patient Complaints: This is a [age] year-old female referred for evaluation of right wrist pain and paresthesias.  NCV & EMG Findings: Extensive electrodiagnostic testing of the right upper extremity shows:  1. Right median and ulnar sensory responses are within normal limits. Right mixed palmer sensory responses show prolonged latency. 2. Right median and ulnar motor responses are within normal limits. 3. Sparse chronic motor axon loss changes are seen affecting the right biceps and deltoid muscles, without accompanied active denervation.  Impression: 1. Right median neuropathy at or distal to the wrist, consistent with the clinical diagnosis of carpal tunnel syndrome. Overall, these findings are very mild in degree electrically 2. Chronic C5 radiculopathy affecting the right upper extremity; mild in degree electrically.   ___________________________ Nita Sickleonika Patel, DO    Nerve Conduction Studies Anti Sensory Summary Table   Site NR Peak (ms) Norm Peak (ms) P-T Amp (V) Norm P-T Amp  Right Median Anti Sensory (2nd Digit)  35.1C  Wrist    3.7 <3.8 22.8 >10  Right Ulnar Anti Sensory (5th Digit)  35.1C  Wrist    2.4 <3.2 18.0 >5   Motor Summary Table   Site NR Onset (ms) Norm Onset (ms) O-P Amp (mV) Norm O-P Amp Site1 Site2 Delta-0 (ms) Dist (cm) Vel (m/s) Norm Vel (m/s)  Right Median Motor (Abd Poll Brev)  35.1C  Wrist    3.4 <4.0 11.0 >5 Elbow Wrist 4.3 26.0 60 >50  Elbow    7.7  11.0         Right Ulnar Motor (Abd Dig Minimi)  35.1C  Wrist    1.7 <3.1 9.6 >7 B Elbow Wrist 3.9 24.0 62 >50  B Elbow    5.6  9.0  A Elbow B Elbow 1.7 10.0 59 >50  A Elbow    7.3  8.6           Comparison Summary Table   Site NR Peak (ms) Norm Peak (ms) P-T Amp (V) Site1 Site2 Delta-P (ms) Norm Delta (ms)  Right Median/Ulnar Palm Comparison (Wrist - 8cm)  35.1C  Median Palm    2.3 <2.2 24.5 Median Palm Ulnar Palm 0.8   Ulnar Palm    1.5 <2.2 15.3       EMG   Side Muscle Ins Act Fibs Psw Fasc Number Recrt Dur Dur. Amp Amp. Poly Poly. Comment  Right 1stDorInt Nml Nml Nml Nml Nml Nml Nml Nml Nml Nml Nml Nml N/A  Right Abd Poll Brev Nml Nml Nml Nml Nml Nml Nml Nml Nml Nml Nml Nml N/A  Right PronatorTeres Nml Nml Nml Nml Nml Nml Nml Nml Nml Nml Nml Nml N/A  Right Biceps Nml Nml Nml Nml 1- Mod-R Few 1+ Few 1+ Nml Nml N/A  Right Triceps Nml Nml Nml Nml Nml Nml Nml Nml Nml Nml Nml Nml N/A  Right Deltoid Nml Nml Nml Nml 1- Rapid Some 1+ Some 1+ Nml Nml N/A      Waveforms:

## 2016-08-14 ENCOUNTER — Encounter (INDEPENDENT_AMBULATORY_CARE_PROVIDER_SITE_OTHER): Payer: PRIVATE HEALTH INSURANCE | Admitting: Ophthalmology

## 2016-08-14 DIAGNOSIS — H33303 Unspecified retinal break, bilateral: Secondary | ICD-10-CM

## 2016-10-29 ENCOUNTER — Ambulatory Visit: Payer: PRIVATE HEALTH INSURANCE | Admitting: Internal Medicine

## 2016-11-18 ENCOUNTER — Other Ambulatory Visit (INDEPENDENT_AMBULATORY_CARE_PROVIDER_SITE_OTHER): Payer: PRIVATE HEALTH INSURANCE

## 2016-11-18 ENCOUNTER — Encounter: Payer: Self-pay | Admitting: Internal Medicine

## 2016-11-18 ENCOUNTER — Ambulatory Visit: Payer: PRIVATE HEALTH INSURANCE | Admitting: Internal Medicine

## 2016-11-18 VITALS — BP 150/90 | HR 92 | Temp 98.7°F | Resp 16 | Ht 63.0 in | Wt 268.0 lb

## 2016-11-18 DIAGNOSIS — E559 Vitamin D deficiency, unspecified: Secondary | ICD-10-CM

## 2016-11-18 DIAGNOSIS — J301 Allergic rhinitis due to pollen: Secondary | ICD-10-CM

## 2016-11-18 DIAGNOSIS — F418 Other specified anxiety disorders: Secondary | ICD-10-CM

## 2016-11-18 DIAGNOSIS — D518 Other vitamin B12 deficiency anemias: Secondary | ICD-10-CM

## 2016-11-18 DIAGNOSIS — I10 Essential (primary) hypertension: Secondary | ICD-10-CM

## 2016-11-18 LAB — CBC WITH DIFFERENTIAL/PLATELET
Basophils Absolute: 0.1 10*3/uL (ref 0.0–0.1)
Basophils Relative: 1.2 % (ref 0.0–3.0)
Eosinophils Absolute: 0.2 10*3/uL (ref 0.0–0.7)
Eosinophils Relative: 2.1 % (ref 0.0–5.0)
HCT: 37.4 % (ref 36.0–46.0)
Hemoglobin: 12 g/dL (ref 12.0–15.0)
Lymphocytes Relative: 31.1 % (ref 12.0–46.0)
Lymphs Abs: 3.2 10*3/uL (ref 0.7–4.0)
MCHC: 32 g/dL (ref 30.0–36.0)
MCV: 85.8 fl (ref 78.0–100.0)
Monocytes Absolute: 0.9 10*3/uL (ref 0.1–1.0)
Monocytes Relative: 8.9 % (ref 3.0–12.0)
Neutro Abs: 5.8 10*3/uL (ref 1.4–7.7)
Neutrophils Relative %: 56.7 % (ref 43.0–77.0)
Platelets: 378 10*3/uL (ref 150.0–400.0)
RBC: 4.36 Mil/uL (ref 3.87–5.11)
RDW: 15.1 % (ref 11.5–15.5)
WBC: 10.2 10*3/uL (ref 4.0–10.5)

## 2016-11-18 MED ORDER — NEBIVOLOL HCL 10 MG PO TABS: 10.0000 mg | ORAL_TABLET | Freq: Every day | ORAL | 0 refills | Status: DC

## 2016-11-18 MED ORDER — METHYLPREDNISOLONE 4 MG PO TBPK: ORAL_TABLET | ORAL | 0 refills | Status: DC

## 2016-11-18 MED ORDER — MONTELUKAST SODIUM 10 MG PO TABS: 10.0000 mg | ORAL_TABLET | Freq: Every day | ORAL | 3 refills | Status: DC

## 2016-11-18 MED ORDER — DULOXETINE HCL 30 MG PO CPEP: 30.0000 mg | ORAL_CAPSULE | Freq: Every day | ORAL | 3 refills | Status: DC

## 2016-11-18 MED ORDER — METHYLPREDNISOLONE 4 MG PO TBPK: ORAL_TABLET | ORAL | 0 refills | Status: AC

## 2016-11-18 MED ORDER — VITAMIN D (ERGOCALCIFEROL) 1.25 MG (50000 UNIT) PO CAPS: ORAL_CAPSULE | ORAL | 2 refills | Status: DC

## 2016-11-18 NOTE — Progress Notes (Signed)
Subjective:  Patient ID: [name], female    DOB: 08/10/1955  Age: 61 y.o. MRN: [id number]  CC: Allergic Rhinitis ; Hypertension; and Anemia   HPI [name] presents for a BP check -she complains of a several week history of nasal congestion, sneezing, and postnasal drip despite using Nasonex and taking an oral antihistamine.  She wants a refill on Singulair.  She also tells me her blood pressure has not been well controlled but she has had no recent episodes of headache/blurred vision/chest pain/or shortness of breath.  She is also due for follow-up on anemia and tells me she is compliant with her vitamin replacements.  Outpatient Medications Prior to Visit  Medication Sig Dispense Refill  . azelastine (ASTELIN) 0.1 % nasal spray Place 1 spray into both nostrils 2 (two) times daily. Use in each nostril as directed 30 mL 12  . [facility] VITAMIN B12 2000 MCG tablet Take 2,000 mcg by mouth daily.  3  . Iron-Folic Acid-B12-C-Docusate (FERRAPLUS 90) 90-1 MG TABS Take 1 tablet by mouth daily. 90 tablet 3  . levocetirizine (XYZAL) 5 MG tablet Take 1 tablet (5 mg total) by mouth every evening. 90 tablet 3  . mometasone (NASONEX) 50 MCG/ACT nasal spray Place 4 sprays into the nose daily. 51 g 3  . potassium chloride SA (K-DUR,KLOR-CON) 20 MEQ tablet Take 1 tablet (20 mEq total) by mouth daily. 90 tablet 1  . triamterene-hydrochlorothiazide (DYAZIDE) 37.5-25 MG capsule Take 1 each (1 capsule total) by mouth daily. 30 capsule 0  . DULoxetine (CYMBALTA) 30 MG capsule TAKE 1 CAPSULE DAILY 90 capsule 3  . MILK THISTLE PO Take by mouth.    . montelukast (SINGULAIR) 10 MG tablet Take 1 tablet (10 mg total) by mouth at bedtime. 90 tablet 3  . Vitamin D, Ergocalciferol, (DRISDOL) 50000 units CAPS capsule TAKE 1 CAPSULE EVERY WEEK 12 capsule 2   No facility-administered medications prior to visit.     ROS Review of Systems  Constitutional: Negative.  Negative for appetite change, diaphoresis, fatigue  and unexpected weight change.  HENT: Positive for congestion, postnasal drip, rhinorrhea and sneezing. Negative for facial swelling, nosebleeds, sinus pressure, sinus pain, sore throat, tinnitus and trouble swallowing.   Eyes: Negative for visual disturbance.  Respiratory: Negative.  Negative for cough, chest tightness, shortness of breath and wheezing.   Cardiovascular: Negative for chest pain, palpitations and leg swelling.  Gastrointestinal: Negative for abdominal pain, constipation, diarrhea, nausea and vomiting.  Endocrine: Negative.   Genitourinary: Negative.  Negative for difficulty urinating and hematuria.  Musculoskeletal: Negative.  Negative for arthralgias, back pain, myalgias and neck pain.  Skin: Negative.  Negative for color change and rash.  Allergic/Immunologic: Negative.   Neurological: Negative.  Negative for dizziness, light-headedness and headaches.  Hematological: Negative for adenopathy. Does not bruise/bleed easily.  Psychiatric/Behavioral: Negative.     Objective:  BP (!) 150/90 (BP Location: Left Arm, Patient Position: Sitting, Cuff Size: Large)   Pulse 92   Temp 98.7 F (37.1 C) (Oral)   Resp 16   Ht 5\' 3"  (1.6 m)   Wt 268 lb (121.6 kg)   SpO2 97%   BMI 47.47 kg/m   BP Readings from Last 3 Encounters:  11/18/16 (!) 150/90  06/04/16 122/74  04/29/16 136/76    Wt Readings from Last 3 Encounters:  11/18/16 268 lb (121.6 kg)  06/04/16 270 lb (122.5 kg)  04/29/16 270 lb (122.5 kg)    Physical Exam  Constitutional: She is oriented to  person, place, and time. No distress.  HENT:  Nose: Mucosal edema and rhinorrhea present. No sinus tenderness. No epistaxis. Right sinus exhibits no maxillary sinus tenderness and no frontal sinus tenderness. Left sinus exhibits no maxillary sinus tenderness and no frontal sinus tenderness.  Mouth/Throat: Oropharynx is clear and moist. No oropharyngeal exudate.  Eyes: Conjunctivae are normal. Right eye exhibits no  discharge. Left eye exhibits no discharge. No scleral icterus.  Neck: Normal range of motion. Neck supple. No JVD present. No thyromegaly present.  Cardiovascular: Normal rate and regular rhythm. Exam reveals no gallop and no friction rub.  No murmur heard. Pulmonary/Chest: Effort normal and breath sounds normal. No respiratory distress. She has no wheezes. She has no rales.  Abdominal: Soft. Bowel sounds are normal. She exhibits no distension and no mass. There is no tenderness. There is no rebound and no guarding.  Musculoskeletal: Normal range of motion. She exhibits no edema, tenderness or deformity.  Lymphadenopathy:    She has no cervical adenopathy.  Neurological: She is alert and oriented to person, place, and time.  Skin: Skin is warm and dry. No rash noted. She is not diaphoretic. No erythema. No pallor.  Vitals reviewed.   Lab Results  Component Value Date   WBC 10.2 11/18/2016   HGB 12.0 11/18/2016   HCT 37.4 11/18/2016   PLT 378.0 11/18/2016   GLUCOSE 93 11/18/2016   CHOL 194 04/29/2016   TRIG 122.0 04/29/2016   HDL 54.90 04/29/2016   LDLCALC 114 (H) 04/29/2016   ALT 24 04/26/2015   AST 15 04/26/2015   NA 140 11/18/2016   K 3.7 11/18/2016   CL 102 11/18/2016   CREATININE 0.90 11/18/2016   BUN 12 11/18/2016   CO2 31 11/18/2016   TSH 1.37 04/26/2015   HGBA1C 5.8 01/30/2016    No results found.  Assessment & Plan:   [name] was seen today for allergic rhinitis , hypertension and anemia.  Diagnoses and all orders for this visit:  Essential hypertension- Her blood pressure is not adequately well controlled on the diuretic.  Her electrolytes and renal function are normal.  Will add nebivolol for better blood pressure control. -     nebivolol (BYSTOLIC) 10 MG tablet; Take 1 tablet (10 mg total) daily by mouth. -     Basic metabolic panel; Future  Seasonal allergic rhinitis due to pollen- She has moderately severe symptoms despite the usual therapy so I have asked  her to try a course of Medrol Dosepak for symptom relief. -     Discontinue: methylPREDNISolone (MEDROL DOSEPAK) 4 MG TBPK tablet; TAKE AS DIRECTED -     montelukast (SINGULAIR) 10 MG tablet; Take 1 tablet (10 mg total) at bedtime by mouth. -     methylPREDNISolone (MEDROL DOSEPAK) 4 MG TBPK tablet; TAKE AS DIRECTED  Other vitamin B12 deficiency anemia- Her H&H are normal now.  Will continue B12 replacement therapy. -     CBC with Differential/Platelet; Future  Depression with anxiety -     DULoxetine (CYMBALTA) 30 MG capsule; Take 1 capsule (30 mg total) daily by mouth.  Vitamin D deficiency -     Vitamin D, Ergocalciferol, (DRISDOL) 50000 units CAPS capsule; TAKE 1 CAPSULE EVERY WEEK   I have discontinued [name]'s MILK THISTLE PO. I have also changed her montelukast and DULoxetine. Additionally, I am having her start on nebivolol. Lastly, I am having her maintain her potassium chloride SA, FERRAPLUS 90, azelastine, [facility]S VITAMIN B12, mometasone, levocetirizine, triamterene-hydrochlorothiazide, methylPREDNISolone, and  Vitamin D (Ergocalciferol).  Meds ordered this encounter  Medications  . DISCONTD: methylPREDNISolone (MEDROL DOSEPAK) 4 MG TBPK tablet    Sig: TAKE AS DIRECTED    Dispense:  21 tablet    Refill:  0  . nebivolol (BYSTOLIC) 10 MG tablet    Sig: Take 1 tablet (10 mg total) daily by mouth.    Dispense:  70 tablet    Refill:  0  . montelukast (SINGULAIR) 10 MG tablet    Sig: Take 1 tablet (10 mg total) at bedtime by mouth.    Dispense:  90 tablet    Refill:  3  . methylPREDNISolone (MEDROL DOSEPAK) 4 MG TBPK tablet    Sig: TAKE AS DIRECTED    Dispense:  21 tablet    Refill:  0  . DULoxetine (CYMBALTA) 30 MG capsule    Sig: Take 1 capsule (30 mg total) daily by mouth.    Dispense:  90 capsule    Refill:  3  . Vitamin D, Ergocalciferol, (DRISDOL) 50000 units CAPS capsule    Sig: TAKE 1 CAPSULE EVERY WEEK    Dispense:  12 capsule    Refill:  2     Follow-up:  Return in about 6 weeks (around 12/30/2016).  [name], MD

## 2016-11-18 NOTE — Patient Instructions (Signed)

## 2016-11-19 ENCOUNTER — Encounter: Payer: Self-pay | Admitting: Internal Medicine

## 2016-11-19 LAB — BASIC METABOLIC PANEL
BUN: 12 mg/dL (ref 6–23)
CO2: 31 mEq/L (ref 19–32)
Calcium: 9.6 mg/dL (ref 8.4–10.5)
Chloride: 102 mEq/L (ref 96–112)
Creatinine, Ser: 0.9 mg/dL (ref 0.40–1.20)
GFR: 81.81 mL/min (ref >60.00)
Glucose, Bld: 93 mg/dL (ref 70–99)
Potassium: 3.7 mEq/L (ref 3.5–5.1)
Sodium: 140 mEq/L (ref 135–145)

## 2016-12-15 ENCOUNTER — Encounter (INDEPENDENT_AMBULATORY_CARE_PROVIDER_SITE_OTHER): Payer: PRIVATE HEALTH INSURANCE | Admitting: Ophthalmology

## 2017-01-03 ENCOUNTER — Other Ambulatory Visit: Payer: Self-pay | Admitting: Internal Medicine

## 2017-01-15 LAB — HM MAMMOGRAPHY: HM Mammogram: NORMAL (ref 0–4)

## 2017-02-04 ENCOUNTER — Other Ambulatory Visit: Payer: Self-pay | Admitting: Internal Medicine

## 2017-02-04 DIAGNOSIS — J301 Allergic rhinitis due to pollen: Secondary | ICD-10-CM

## 2017-03-13 ENCOUNTER — Telehealth: Payer: Self-pay | Admitting: Internal Medicine

## 2017-03-13 NOTE — Telephone Encounter (Signed)
Called patient to inform. The VM box is full. I was able to send a SMS message with the number.   Please have patient set up an appointment when they call back. Thank you.

## 2017-03-13 NOTE — Telephone Encounter (Signed)
Copied from CRM [phone]. Topic: Quick Communication - See Telephone Encounter >> Mar 13, 2017 10:17 AM Arlyss Gandyichardson, Dionta Larke N, NT wrote: CRM for notification. See Telephone encounter for: Pt states that several times when she has bad allergies Dr. Yetta BarreJones calls in prednisone for her and she is wanting to see if he will order this for her. [facility] [address]  03/13/17.

## 2017-03-13 NOTE — Telephone Encounter (Signed)
Pt will have to be seen by PCP or another provider.   Dr. Yetta BarreJones has rx'ed prednisone twice and pt was seen both times (04/29/2016 and 11/18/2016).

## 2017-05-01 ENCOUNTER — Encounter: Payer: Self-pay | Admitting: Internal Medicine

## 2017-07-21 ENCOUNTER — Other Ambulatory Visit: Payer: Self-pay | Admitting: Internal Medicine

## 2017-07-29 ENCOUNTER — Other Ambulatory Visit: Payer: Self-pay | Admitting: Internal Medicine

## 2017-07-29 DIAGNOSIS — J301 Allergic rhinitis due to pollen: Secondary | ICD-10-CM

## 2017-09-15 ENCOUNTER — Other Ambulatory Visit: Payer: Self-pay | Admitting: Internal Medicine

## 2017-09-15 DIAGNOSIS — E559 Vitamin D deficiency, unspecified: Secondary | ICD-10-CM

## 2017-09-19 ENCOUNTER — Other Ambulatory Visit: Payer: Self-pay | Admitting: Internal Medicine

## 2017-09-19 DIAGNOSIS — E559 Vitamin D deficiency, unspecified: Secondary | ICD-10-CM

## 2017-09-21 ENCOUNTER — Telehealth: Payer: Self-pay | Admitting: Internal Medicine

## 2017-09-21 MED ORDER — TRIAMTERENE-HCTZ 37.5-25 MG PO TABS: 1.0000 | ORAL_TABLET | Freq: Every day | ORAL | 0 refills | Status: DC

## 2017-09-21 NOTE — Telephone Encounter (Signed)
Patient  Requesting 1 refill to last until her appointment on 10/13/17; last refill request was denied. Several appts have been cancelled by patient.  Vitamin D, Ergocalciferol, (DRISDOL) 50000 units CAPS capsule triamterene-hydrochlorothiazide (MAXZIDE-25) 37.5-25 MG tablet  LOV 11/18/16 Dr. Yetta BarreJones  [id number] - [place],  - [address]

## 2017-09-21 NOTE — Telephone Encounter (Signed)
Copied from CRM [phone]. Topic: Quick Communication - Rx Refill/Question >> Sep 21, 2017 10:10 AM Mickel BaasMcGee, Stephanine Reas B, NT wrote: **Patient would like to know if 1 refill could be given to last until her appointment on 10/13/17**  Medication: Vitamin D, Ergocalciferol, (DRISDOL) 50000 units CAPS capsule triamterene-hydrochlorothiazide (MAXZIDE-25) 37.5-25 MG tablet  Has the patient contacted their pharmacy? Yes.   (Agent: If no, request that the patient contact the pharmacy for the refill.) (Agent: If yes, when and what did the pharmacy advise?)  Preferred Pharmacy (with phone number or street name): [id number] - [place], [place] - [address]  Agent: Please be advised that RX refills may take up to 3 business days. We ask that you follow-up with your pharmacy.

## 2017-09-21 NOTE — Telephone Encounter (Signed)
No canceled appt since last OV with PCP.  ERX 30 day of triamterene HCTZ. No additional refills until pt is seen.

## 2017-10-13 ENCOUNTER — Encounter: Payer: Self-pay | Admitting: Internal Medicine

## 2017-10-13 ENCOUNTER — Ambulatory Visit (INDEPENDENT_AMBULATORY_CARE_PROVIDER_SITE_OTHER)
Admission: RE | Admit: 2017-10-13 | Discharge: 2017-10-13 | Disposition: A | Discharge status: Home / Self Care | Payer: BLUE CROSS/BLUE SHIELD | Source: Ambulatory Visit | Attending: Internal Medicine | Admitting: Internal Medicine

## 2017-10-13 ENCOUNTER — Telehealth: Payer: Self-pay | Admitting: Internal Medicine

## 2017-10-13 ENCOUNTER — Ambulatory Visit: Payer: BLUE CROSS/BLUE SHIELD | Admitting: Internal Medicine

## 2017-10-13 ENCOUNTER — Other Ambulatory Visit (INDEPENDENT_AMBULATORY_CARE_PROVIDER_SITE_OTHER): Payer: BLUE CROSS/BLUE SHIELD

## 2017-10-13 VITALS — BP 130/80 | HR 96 | Temp 98.2°F | Ht 63.0 in | Wt 264.8 lb

## 2017-10-13 DIAGNOSIS — R0609 Other forms of dyspnea: Secondary | ICD-10-CM | POA: Insufficient documentation

## 2017-10-13 DIAGNOSIS — R05 Cough: Secondary | ICD-10-CM

## 2017-10-13 DIAGNOSIS — R739 Hyperglycemia, unspecified: Secondary | ICD-10-CM | POA: Diagnosis not present

## 2017-10-13 DIAGNOSIS — E785 Hyperlipidemia, unspecified: Secondary | ICD-10-CM | POA: Diagnosis not present

## 2017-10-13 DIAGNOSIS — E559 Vitamin D deficiency, unspecified: Secondary | ICD-10-CM

## 2017-10-13 DIAGNOSIS — R9431 Abnormal electrocardiogram [ECG] [EKG]: Secondary | ICD-10-CM

## 2017-10-13 DIAGNOSIS — N393 Stress incontinence (female) (male): Secondary | ICD-10-CM

## 2017-10-13 DIAGNOSIS — R0600 Dyspnea, unspecified: Secondary | ICD-10-CM | POA: Insufficient documentation

## 2017-10-13 DIAGNOSIS — R059 Cough, unspecified: Secondary | ICD-10-CM | POA: Insufficient documentation

## 2017-10-13 DIAGNOSIS — Z0000 Encounter for general adult medical examination without abnormal findings: Secondary | ICD-10-CM

## 2017-10-13 DIAGNOSIS — I10 Essential (primary) hypertension: Secondary | ICD-10-CM

## 2017-10-13 DIAGNOSIS — D510 Vitamin B12 deficiency anemia due to intrinsic factor deficiency: Secondary | ICD-10-CM | POA: Diagnosis not present

## 2017-10-13 DIAGNOSIS — R911 Solitary pulmonary nodule: Secondary | ICD-10-CM

## 2017-10-13 DIAGNOSIS — R0602 Shortness of breath: Secondary | ICD-10-CM | POA: Diagnosis not present

## 2017-10-13 DIAGNOSIS — Z23 Encounter for immunization: Secondary | ICD-10-CM | POA: Diagnosis not present

## 2017-10-13 DIAGNOSIS — R918 Other nonspecific abnormal finding of lung field: Secondary | ICD-10-CM | POA: Insufficient documentation

## 2017-10-13 LAB — URINALYSIS, ROUTINE W REFLEX MICROSCOPIC
Bilirubin Urine: NEGATIVE
Hgb urine dipstick: NEGATIVE
Ketones, ur: NEGATIVE
Leukocytes, UA: NEGATIVE
Nitrite: NEGATIVE
Specific Gravity, Urine: 1.025 (ref 1.000–1.030)
Total Protein, Urine: NEGATIVE
Urine Glucose: NEGATIVE
Urobilinogen, UA: 0.2 (ref 0.0–1.0)
pH: 5.5 (ref 5.0–8.0)

## 2017-10-13 LAB — CBC WITH DIFFERENTIAL/PLATELET
Basophils Absolute: 0.1 10*3/uL (ref 0.0–0.1)
Basophils Relative: 0.6 % (ref 0.0–3.0)
Eosinophils Absolute: 0.2 10*3/uL (ref 0.0–0.7)
Eosinophils Relative: 2.1 % (ref 0.0–5.0)
HCT: 35.6 % — ABNORMAL LOW (ref 36.0–46.0)
Hemoglobin: 11.3 g/dL — ABNORMAL LOW (ref 12.0–15.0)
Lymphocytes Relative: 22.6 % (ref 12.0–46.0)
Lymphs Abs: 1.8 10*3/uL (ref 0.7–4.0)
MCHC: 31.9 g/dL (ref 30.0–36.0)
MCV: 85.7 fl (ref 78.0–100.0)
Monocytes Absolute: 0.5 10*3/uL (ref 0.1–1.0)
Monocytes Relative: 6.1 % (ref 3.0–12.0)
Neutro Abs: 5.5 10*3/uL (ref 1.4–7.7)
Neutrophils Relative %: 68.6 % (ref 43.0–77.0)
Platelets: 319 10*3/uL (ref 150.0–400.0)
RBC: 4.15 Mil/uL (ref 3.87–5.11)
RDW: 15.6 % — ABNORMAL HIGH (ref 11.5–15.5)
WBC: 8.1 10*3/uL (ref 4.0–10.5)

## 2017-10-13 LAB — COMPREHENSIVE METABOLIC PANEL
ALT: 23 U/L (ref 0–35)
AST: 18 U/L (ref 0–37)
Albumin: 4 g/dL (ref 3.5–5.2)
Alkaline Phosphatase: 101 U/L (ref 39–117)
BUN: 13 mg/dL (ref 6–23)
CO2: 33 mEq/L — ABNORMAL HIGH (ref 19–32)
Calcium: 9.4 mg/dL (ref 8.4–10.5)
Chloride: 101 mEq/L (ref 96–112)
Creatinine, Ser: 0.87 mg/dL (ref 0.40–1.20)
GFR: 84.83 mL/min (ref >60.00)
Glucose, Bld: 97 mg/dL (ref 70–99)
Potassium: 3.6 mEq/L (ref 3.5–5.1)
Sodium: 140 mEq/L (ref 135–145)
Total Bilirubin: 0.3 mg/dL (ref 0.2–1.2)
Total Protein: 7.6 g/dL (ref 6.0–8.3)

## 2017-10-13 LAB — LIPID PANEL
Cholesterol: 180 mg/dL (ref 0–200)
HDL: 50.9 mg/dL (ref >39.00)
LDL Cholesterol: 103 mg/dL — ABNORMAL HIGH (ref 0–99)
NonHDL: 129.39
Total CHOL/HDL Ratio: 4
Triglycerides: 133 mg/dL (ref 0.0–149.0)
VLDL: 26.6 mg/dL (ref 0.0–40.0)

## 2017-10-13 LAB — CARDIAC PANEL
CK-MB: 2.4 ng/mL (ref 0.3–4.0)
Relative Index: 1 calc (ref 0.0–2.5)
Total CK: 240 U/L — ABNORMAL HIGH (ref 7–177)

## 2017-10-13 LAB — HEMOGLOBIN A1C: Hgb A1c MFr Bld: 5.8 % (ref 4.6–6.5)

## 2017-10-13 LAB — TROPONIN I: TNIDX: 0.01 ug/l (ref 0.00–0.06)

## 2017-10-13 LAB — VITAMIN B12: Vitamin B-12: 1291 pg/mL — ABNORMAL HIGH (ref 211–911)

## 2017-10-13 LAB — VITAMIN D 25 HYDROXY (VIT D DEFICIENCY, FRACTURES): VITD: 23.37 ng/mL — ABNORMAL LOW (ref 30.00–100.00)

## 2017-10-13 LAB — TSH: TSH: 1.62 u[IU]/mL (ref 0.35–4.50)

## 2017-10-13 LAB — BRAIN NATRIURETIC PEPTIDE: Pro B Natriuretic peptide (BNP): 16 pg/mL (ref 0.0–100.0)

## 2017-10-13 LAB — FOLATE: Folate: 7.2 ng/mL (ref >5.9)

## 2017-10-13 MED ORDER — CHOLECALCIFEROL 1.25 MG (50000 UT) PO CAPS: 50000.0000 [IU] | ORAL_CAPSULE | ORAL | 1 refills | Status: DC

## 2017-10-13 NOTE — Patient Instructions (Signed)

## 2017-10-13 NOTE — Progress Notes (Signed)
Subjective:  Patient ID: [name], female    DOB: 10-19-1955  Age: 62 y.o. MRN: [id number]  CC: Annual Exam; Hypertension; and Hyperlipidemia   HPI [name] presents for a CPX.   She complains of a 6-week history of nonproductive cough and DOE.  She denies chest pain, hemoptysis, diaphoresis, lightheadedness, or dizziness.  She also complains of urinary incontinence associated with sneezing and lifting.  She previously saw a urologist about this and it was recommended that she do some Botox injections but she deferred at that time.  She is reconsidering this decision.  Outpatient Medications Prior to Visit  Medication Sig Dispense Refill  . azelastine (ASTELIN) 0.1 % nasal spray PLACE 1 SPRAY INTO BOTH NOSTRILS 2 (TWO) TIMES DAILY. USE IN EACH NOSTRIL AS DIRECTED 30 mL 1  . Besifloxacin HCl (BESIVANCE) 0.6 % SUSP Besivance 0.6 % eye drops,suspension    . CVS VITAMIN B12 2000 MCG tablet Take 2,000 mcg by mouth daily.  3  . DULoxetine (CYMBALTA) 30 MG capsule Take 1 capsule (30 mg total) daily by mouth. 90 capsule 3  . mometasone (NASONEX) 50 MCG/ACT nasal spray Place 4 sprays into the nose daily. 51 g 3  . montelukast (SINGULAIR) 10 MG tablet Take 1 tablet (10 mg total) at bedtime by mouth. 90 tablet 3  . POTASSIUM CHLORIDE PO Take by mouth.    . potassium chloride SA (K-DUR,KLOR-CON) 20 MEQ tablet Take 1 tablet (20 mEq total) by mouth daily. 90 tablet 1  . triamterene-hydrochlorothiazide (MAXZIDE-25) 37.5-25 MG tablet Take 1 tablet by mouth daily. 30 tablet 0  . VITAMIN D, CHOLECALCIFEROL, PO Take by mouth.    . Iron-Folic Acid-B12-C-Docusate (FERRAPLUS 90) 90-1 MG TABS Take 1 tablet by mouth daily. 90 tablet 3  . levocetirizine (XYZAL) 5 MG tablet Take 1 tablet (5 mg total) by mouth every evening. 90 tablet 3  . montelukast (SINGULAIR) 10 MG tablet TAKE 1 TABLET (10 MG TOTAL) BY MOUTH AT BEDTIME. 90 tablet 1  . nebivolol (BYSTOLIC) 10 MG tablet Take 1 tablet (10 mg total)  daily by mouth. 70 tablet 0  . triamterene-hydrochlorothiazide (DYAZIDE) 37.5-25 MG capsule Take 1 each (1 capsule total) by mouth daily. 30 capsule 0  . Vitamin D, Ergocalciferol, (DRISDOL) 50000 units CAPS capsule TAKE 1 CAPSULE EVERY WEEK 12 capsule 2   No facility-administered medications prior to visit.     ROS Review of Systems  Constitutional: Negative for appetite change, diaphoresis, fatigue and unexpected weight change.  HENT: Negative.  Negative for trouble swallowing.   Eyes: Negative for visual disturbance.  Respiratory: Positive for cough and shortness of breath. Negative for chest tightness, wheezing and stridor.   Cardiovascular: Negative for chest pain, palpitations and leg swelling.  Gastrointestinal: Negative for abdominal pain, constipation, diarrhea, nausea and vomiting.  Endocrine: Negative.   Genitourinary: Negative.  Negative for decreased urine volume, difficulty urinating, dysuria, hematuria, pelvic pain and urgency.  Musculoskeletal: Negative.  Negative for arthralgias, joint swelling and myalgias.  Skin: Negative for color change, pallor and rash.  Neurological: Negative.  Negative for dizziness, weakness, light-headedness and headaches.  Hematological: Negative for adenopathy. Does not bruise/bleed easily.  Psychiatric/Behavioral: Negative.     Objective:  BP 130/80 (BP Location: Left Arm, Patient Position: Sitting, Cuff Size: Large)   Pulse 96   Temp 98.2 F (36.8 C) (Oral)   Ht 5\' 3"  (1.6 m)   Wt 264 lb 12 oz (120.1 kg)   SpO2 95%   BMI 46.90 kg/m  BP Readings from Last 3 Encounters:  10/13/17 130/80  11/18/16 (!) 150/90  06/04/16 122/74    Wt Readings from Last 3 Encounters:  10/13/17 264 lb 12 oz (120.1 kg)  11/18/16 268 lb (121.6 kg)  06/04/16 270 lb (122.5 kg)    Physical Exam  Constitutional: She is oriented to person, place, and time. No distress.  HENT:  Mouth/Throat: Oropharynx is clear and moist. No oropharyngeal exudate.    Eyes: Conjunctivae are normal. No scleral icterus.  Neck: Normal range of motion. Neck supple. No JVD present. No thyromegaly present.  Cardiovascular: Normal rate, regular rhythm and normal heart sounds. Exam reveals no gallop and no friction rub.  No murmur heard. EKG ---  Sinus  Rhythm  -  Negative T-waves  -Possible  Anterior  ischemia.   ABNORMAL - changes are not significant  Pulmonary/Chest: Effort normal and breath sounds normal. No respiratory distress. She has no wheezes. She has no rales.  Abdominal: Soft. Bowel sounds are normal. She exhibits no mass. There is no hepatosplenomegaly. There is no tenderness.  Musculoskeletal: Normal range of motion. She exhibits no edema, tenderness or deformity.  Lymphadenopathy:    She has no cervical adenopathy.  Neurological: She is alert and oriented to person, place, and time.  Skin: Skin is warm and dry. She is not diaphoretic. No pallor.  Psychiatric: She has a normal mood and affect. Her behavior is normal. Judgment and thought content normal.  Vitals reviewed.   Lab Results  Component Value Date   WBC 8.1 10/13/2017   HGB 11.3 (L) 10/13/2017   HCT 35.6 (L) 10/13/2017   PLT 319.0 10/13/2017   GLUCOSE 97 10/13/2017   CHOL 180 10/13/2017   TRIG 133.0 10/13/2017   HDL 50.90 10/13/2017   LDLCALC 103 (H) 10/13/2017   ALT 23 10/13/2017   AST 18 10/13/2017   NA 140 10/13/2017   K 3.6 10/13/2017   CL 101 10/13/2017   CREATININE 0.87 10/13/2017   BUN 13 10/13/2017   CO2 33 (H) 10/13/2017   TSH 1.62 10/13/2017   HGBA1C 5.8 10/13/2017    No results found.  Assessment & Plan:   [name] was seen today for annual exam, hypertension and hyperlipidemia.  Diagnoses and all orders for this visit:  Essential hypertension-her blood pressure is well controlled.  Electrolytes and renal function are normal.  We will continue the current regimen. -     Comprehensive metabolic panel; Future -     TSH; Future -     Urinalysis, Routine  w reflex microscopic; Future -     EKG 12-Lead  Vitamin B12 deficiency anemia due to intrinsic factor deficiency- Her H&H have improved.  Will continue B12 replacement therapy. -     CBC with Differential/Platelet; Future -     Vitamin B12; Future -     Folate; Future  Vitamin D deficiency- Her vitamin D level is low.  I have asked her to restart the vitamin D supplement. -     VITAMIN D 25 Hydroxy (Vit-D Deficiency, Fractures); Future -     Cholecalciferol 50000 units capsule; Take 1 capsule (50,000 Units total) by mouth once a week.  Hyperlipidemia with target LDL less than 130- She has a low Framingham risk score so I do not recommend a statin for CV risk reduction -     TSH; Future  Routine general medical examination at a health care facility- Exam completed, labs reviewed, vaccines reviewed and updated, Pap/mammogram/colon cancer screening are up-to-date,  patient education material was given. -     Lipid panel; Future  Hyperglycemia-her A1c is at 5.8%.  She has mild prediabetes.  Medical therapy is not indicated. -     Comprehensive metabolic panel; Future -     Hemoglobin A1c; Future  Need for influenza vaccination -     Flu Vaccine QUAD 36+ mos IM  SUI (stress urinary incontinence, female) -     Ambulatory referral to Urology  DOE (dyspnea on exertion)- Screening for cardiac ischemia and fluid overload is negative.  She has a low ASCVD risk score so I do not think her symptoms are cardiac in nature.  I am concerned that her symptoms are related to poor conditioning and obesity.  I have asked her to improve her lifestyle modifications. -     Cardiac panel; Future -     Troponin I; Future -     Brain natriuretic peptide; Future  Abnormal electrocardiogram (ECG) (EKG)- As above -     Cardiac panel; Future -     Troponin I; Future -     Brain natriuretic peptide; Future  Cough- Her chest x-ray is positive for new 7 mm nodules on the right side.  She will undergo a CT without  contrast to screen for malignancy and infection. -     DG Chest 2 View; Future  Nodule of right lung -     CT Chest Wo Contrast; Future  Abnormal findings on diagnostic imaging of lung -     CT Chest Wo Contrast; Future   I have discontinued Julisa Bloodsworth's FERRAPLUS 90, levocetirizine, nebivolol, Vitamin D (Ergocalciferol), and (VITAMIN D, CHOLECALCIFEROL, PO). I am also having her start on Cholecalciferol. Additionally, I am having her maintain her potassium chloride SA, CVS VITAMIN B12, mometasone, montelukast, DULoxetine, azelastine, triamterene-hydrochlorothiazide, POTASSIUM CHLORIDE PO, and Besifloxacin HCl.  Meds ordered this encounter  Medications  . Cholecalciferol 50000 units capsule    Sig: Take 1 capsule (50,000 Units total) by mouth once a week.    Dispense:  12 capsule    Refill:  1     Follow-up: Return in about 1 week (around 10/20/2017).  [name], MD

## 2017-10-13 NOTE — Telephone Encounter (Signed)
LVM for pt to call back as soon as possible.   RE: xray results (My chart message sent to pt from PCP)

## 2017-10-13 NOTE — Telephone Encounter (Signed)
[name] with [facility] called with chest xray report:    IMPRESSION: 1. 7 mm nodular opacity right base region. This finding was not present previously. This finding warrants noncontrast enhanced chest CT to further assess.  2.  Right base atelectasis.  No edema or consolidation.  3.  Heart size within normal limits.  4.  Lap band at gastric cardia level.  Flow at [facility] at [facility] notified.

## 2017-10-17 ENCOUNTER — Other Ambulatory Visit: Payer: Self-pay | Admitting: Internal Medicine

## 2017-10-17 DIAGNOSIS — E559 Vitamin D deficiency, unspecified: Secondary | ICD-10-CM

## 2017-10-19 ENCOUNTER — Ambulatory Visit (INDEPENDENT_AMBULATORY_CARE_PROVIDER_SITE_OTHER)
Admission: RE | Admit: 2017-10-19 | Discharge: 2017-10-19 | Disposition: A | Discharge status: Home / Self Care | Payer: BLUE CROSS/BLUE SHIELD | Source: Ambulatory Visit | Attending: Internal Medicine | Admitting: Internal Medicine

## 2017-10-19 DIAGNOSIS — R911 Solitary pulmonary nodule: Secondary | ICD-10-CM | POA: Diagnosis not present

## 2017-10-19 DIAGNOSIS — R918 Other nonspecific abnormal finding of lung field: Secondary | ICD-10-CM

## 2017-10-20 ENCOUNTER — Encounter: Payer: Self-pay | Admitting: Internal Medicine

## 2017-10-21 ENCOUNTER — Ambulatory Visit: Payer: BLUE CROSS/BLUE SHIELD | Admitting: Internal Medicine

## 2017-10-21 ENCOUNTER — Encounter: Payer: Self-pay | Admitting: Internal Medicine

## 2017-10-21 VITALS — BP 140/70 | HR 86 | Temp 98.7°F | Ht 63.0 in | Wt 264.2 lb

## 2017-10-21 DIAGNOSIS — J4520 Mild intermittent asthma, uncomplicated: Secondary | ICD-10-CM

## 2017-10-21 DIAGNOSIS — R0609 Other forms of dyspnea: Secondary | ICD-10-CM | POA: Diagnosis not present

## 2017-10-21 DIAGNOSIS — K2100 Gastro-esophageal reflux disease with esophagitis, without bleeding: Secondary | ICD-10-CM

## 2017-10-21 DIAGNOSIS — R0600 Dyspnea, unspecified: Secondary | ICD-10-CM

## 2017-10-21 DIAGNOSIS — R9431 Abnormal electrocardiogram [ECG] [EKG]: Secondary | ICD-10-CM

## 2017-10-21 MED ORDER — MOMETASONE FUROATE 200 MCG/ACT IN AERO: 1.0000 | INHALATION_SPRAY | Freq: Two times a day (BID) | RESPIRATORY_TRACT | 11 refills | Status: DC

## 2017-10-21 MED ORDER — ESOMEPRAZOLE MAGNESIUM 40 MG PO CPDR: 40.0000 mg | DELAYED_RELEASE_CAPSULE | Freq: Every day | ORAL | 1 refills | Status: DC

## 2017-10-21 NOTE — Patient Instructions (Signed)
Cough, Adult  Coughing is a reflex that clears your throat and your airways. Coughing helps to heal and protect your lungs. It is normal to cough occasionally, but a cough that happens with other symptoms or lasts a long time may be a sign of a condition that needs treatment. A cough may last only 2-3 weeks (acute), or it may last longer than 8 weeks (chronic).  What are the causes?  Coughing is commonly caused by:   Breathing in substances that irritate your lungs.   A viral or bacterial respiratory infection.   Allergies.   Asthma.   Postnasal drip.   Smoking.   Acid backing up from the stomach into the esophagus (gastroesophageal reflux).   Certain medicines.   Chronic lung problems, including COPD (or rarely, lung cancer).   Other medical conditions such as heart failure.    Follow these instructions at home:  Pay attention to any changes in your symptoms. Take these actions to help with your discomfort:   Take medicines only as told by your health care provider.  ? If you were prescribed an antibiotic medicine, take it as told by your health care provider. Do not stop taking the antibiotic even if you start to feel better.  ? Talk with your health care provider before you take a cough suppressant medicine.   Drink enough fluid to keep your urine clear or pale yellow.   If the air is dry, use a cold steam vaporizer or humidifier in your bedroom or your home to help loosen secretions.   Avoid anything that causes you to cough at work or at home.   If your cough is worse at night, try sleeping in a semi-upright position.   Avoid cigarette smoke. If you smoke, quit smoking. If you need help quitting, ask your health care provider.   Avoid caffeine.   Avoid alcohol.   Rest as needed.    Contact a health care provider if:   You have new symptoms.   You cough up pus.   Your cough does not get better after 2-3 weeks, or your cough gets worse.   You cannot control your cough with suppressant  medicines and you are losing sleep.   You develop pain that is getting worse or pain that is not controlled with pain medicines.   You have a fever.   You have unexplained weight loss.   You have night sweats.  Get help right away if:   You cough up blood.   You have difficulty breathing.   Your heartbeat is very fast.  This information is not intended to replace advice given to you by your health care provider. Make sure you discuss any questions you have with your health care provider.  Document Released: 06/21/2010 Document Revised: 05/31/2015 Document Reviewed: 03/01/2014  Elsevier Interactive Patient Education  2018 [employer]

## 2017-10-21 NOTE — Progress Notes (Signed)
Subjective:  Patient ID: [name], female    DOB: 1955/06/10  Age: 62 y.o. MRN: [id number]  CC: Cough and Hypertension   HPI [name] presents for f/up - She continues to complain of mild intermittent cough but tells me the cough is improving.  Her recent non-contrast CT scan showed about 20 lung nodules, all appeared benign.  This probably stems from an episode of pneumonia that required intubation several years ago.  She does not have any significant risk factors for lung cancer.  She has a history of asthma but is not currently treating it.  She complains of worsening dyspnea on exertion over the last year.  Her recent EKG showed nonspecific T wave changes.  She also complains of heartburn, indigestion, and burping.  Outpatient Medications Prior to Visit  Medication Sig Dispense Refill  . azelastine (ASTELIN) 0.1 % nasal spray PLACE 1 SPRAY INTO BOTH NOSTRILS 2 (TWO) TIMES DAILY. USE IN EACH NOSTRIL AS DIRECTED 30 mL 1  . Besifloxacin HCl (BESIVANCE) 0.6 % SUSP Besivance 0.6 % eye drops,suspension    . CVS VITAMIN B12 2000 MCG tablet Take 2,000 mcg by mouth daily.  3  . DULoxetine (CYMBALTA) 30 MG capsule Take 1 capsule (30 mg total) daily by mouth. 90 capsule 3  . mometasone (NASONEX) 50 MCG/ACT nasal spray Place 4 sprays into the nose daily. 51 g 3  . montelukast (SINGULAIR) 10 MG tablet Take 1 tablet (10 mg total) at bedtime by mouth. 90 tablet 3  . POTASSIUM CHLORIDE PO Take by mouth.    . potassium chloride SA (K-DUR,KLOR-CON) 20 MEQ tablet Take 1 tablet (20 mEq total) by mouth daily. 90 tablet 1  . triamterene-hydrochlorothiazide (MAXZIDE-25) 37.5-25 MG tablet Take 1 tablet by mouth daily. 30 tablet 0  . Vitamin D, Ergocalciferol, (DRISDOL) 50000 units CAPS capsule TAKE ONE CAPSULE BY MOUTH ONE TIME PER WEEK 12 capsule 1  . Cholecalciferol 50000 units capsule Take 1 capsule (50,000 Units total) by mouth once a week. 12 capsule 1   No facility-administered medications prior  to visit.     ROS Review of Systems  Constitutional: Negative for chills, diaphoresis, fatigue and fever.  HENT: Negative.  Negative for sinus pressure, trouble swallowing and voice change.   Eyes: Negative for visual disturbance.  Respiratory: Positive for cough and shortness of breath (DOE). Negative for chest tightness, wheezing and stridor.   Cardiovascular: Negative for chest pain, palpitations and leg swelling.  Gastrointestinal: Negative for abdominal pain, constipation, diarrhea, nausea and vomiting.  Endocrine: Negative for polyuria.  Genitourinary: Negative.  Negative for difficulty urinating and dysuria.  Musculoskeletal: Negative.  Negative for arthralgias and myalgias.  Skin: Negative.  Negative for color change and pallor.  Neurological: Negative.  Negative for dizziness and weakness.  Hematological: Negative for adenopathy. Does not bruise/bleed easily.  Psychiatric/Behavioral: Negative.     Objective:  BP 140/70 (BP Location: Left Arm, Patient Position: Sitting, Cuff Size: Large)   Pulse 86   Temp 98.7 F (37.1 C) (Oral)   Ht 5\' 3"  (1.6 m)   Wt 264 lb 4 oz (119.9 kg)   SpO2 94%   BMI 46.81 kg/m   BP Readings from Last 3 Encounters:  10/21/17 140/70  10/13/17 130/80  11/18/16 (!) 150/90    Wt Readings from Last 3 Encounters:  10/21/17 264 lb 4 oz (119.9 kg)  10/13/17 264 lb 12 oz (120.1 kg)  11/18/16 268 lb (121.6 kg)    Physical Exam  Constitutional: She is  oriented to person, place, and time. No distress.  HENT:  Mouth/Throat: Oropharynx is clear and moist. No oropharyngeal exudate.  Eyes: Conjunctivae are normal. No scleral icterus.  Neck: Normal range of motion. Neck supple. No JVD present. No thyromegaly present.  Cardiovascular: Normal rate, regular rhythm and normal heart sounds. Exam reveals no gallop.  No murmur heard. Pulmonary/Chest: Effort normal and breath sounds normal. No respiratory distress. She has no wheezes. She has no rales.    Abdominal: Soft. Bowel sounds are normal. She exhibits no mass. There is no hepatosplenomegaly. There is no tenderness.  Musculoskeletal: Normal range of motion. She exhibits edema. She exhibits no tenderness or deformity.  There is trace pitting edema over the dorsum of both feet.  Lymphadenopathy:    She has no cervical adenopathy.  Neurological: She is alert and oriented to person, place, and time.  Skin: Skin is warm and dry. She is not diaphoretic. No pallor.  Vitals reviewed.   Lab Results  Component Value Date   WBC 8.1 10/13/2017   HGB 11.3 (L) 10/13/2017   HCT 35.6 (L) 10/13/2017   PLT 319.0 10/13/2017   GLUCOSE 97 10/13/2017   CHOL 180 10/13/2017   TRIG 133.0 10/13/2017   HDL 50.90 10/13/2017   LDLCALC 103 (H) 10/13/2017   ALT 23 10/13/2017   AST 18 10/13/2017   NA 140 10/13/2017   K 3.6 10/13/2017   CL 101 10/13/2017   CREATININE 0.87 10/13/2017   BUN 13 10/13/2017   CO2 33 (H) 10/13/2017   TSH 1.62 10/13/2017   HGBA1C 5.8 10/13/2017    Ct Chest Wo Contrast  Result Date: 10/20/2017 CLINICAL DATA:  Further evaluation of right lung nodule on recent chest radiographs. EXAM: CT CHEST WITHOUT CONTRAST TECHNIQUE: Multidetector CT imaging of the chest was performed following the standard protocol without IV contrast. COMPARISON:  Chest radiographs 10/13/2017. CT abdomen and pelvis 02/20/2015. Chest CT 08/16/2004. FINDINGS: Cardiovascular: Mild thoracic aortic atherosclerosis without aneurysm. Normal heart size. No pericardial effusion. Mediastinum/Nodes: Possible small sliding hiatal hernia. Unremarkable thyroid. No enlarged axillary, mediastinal, or hilar lymph nodes identified within limitations of noncontrast technique. Lungs/Pleura: No pleural effusion or pneumothorax. Chronic elevation of the right hemidiaphragm with associated right basilar subsegmental atelectasis. Mild lingular atelectasis. There are approximately 20 subcentimeter lung nodules involving the right  greater than left lungs. Due to motion artifact and atelectasis in the lung bases on the 2017 abdominal CT and motion artifact and lung consolidation on the 2006 chest CT, it cannot be determined whether any of these nodules were present previously. The nodules are annotated on series 3 with the largest nodule measuring 7 mm in the right lower lobe (image 62). Upper Abdomen: Status post cholecystectomy and laparoscopic gastric banding. Musculoskeletal: No suspicious osseous lesion. Moderately advanced thoracic disc degeneration. IMPRESSION: 1. Approximately 20 lung nodules measuring up to 7 mm in size. Non-contrast chest CT at 3-6 months is recommended. If the nodules are stable at time of repeat CT, then future CT at 18-24 months (from today's scan) is considered optional for low-risk patients, but is recommended for high-risk patients. This recommendation follows the consensus statement: Guidelines for Management of Incidental Pulmonary Nodules Detected on CT Images: From the Fleischner Society 2017; Radiology 2017; 284:228-243. 2. Chronic elevation of the right hemidiaphragm with right basilar atelectasis. 3. Aortic Atherosclerosis (ICD10-I70.0). Electronically Signed   By: Sebastian Ache M.D.   On: 10/20/2017 08:35    Assessment & Plan:   [name] was seen today for cough and  hypertension.  Diagnoses and all orders for this visit:  DOE (dyspnea on exertion)- I have asked her to undergo a myocardial perfusion imaging study to screen for ischemia. -     MYOCARDIAL PERFUSION IMAGING; Future  Abnormal electrocardiogram (ECG) (EKG) -     MYOCARDIAL PERFUSION IMAGING; Future  Abnormal electrocardiogram -     MYOCARDIAL PERFUSION IMAGING; Future  Mild intermittent asthma without complication- This may be contributing to her cough.  I have asked her to start using an inhaled corticosteroid.  I gave her a sample of Asmanex and showed her how to use it.  She demonstrated proficiency with its use. -      Mometasone Furoate (ASMANEX HFA) 200 MCG/ACT AERO; Inhale 1 puff into the lungs 2 (two) times daily.  GERD with esophagitis- This may be contributing to her cough.  I have asked her to start taking a PPI. -     esomeprazole (NEXIUM) 40 MG capsule; Take 1 capsule (40 mg total) by mouth daily at 12 noon.   I have discontinued [facility] Cholecalciferol. I am also having her start on Mometasone Furoate and esomeprazole. Additionally, I am having her maintain her potassium chloride SA, CVS VITAMIN B12, mometasone, montelukast, DULoxetine, azelastine, triamterene-hydrochlorothiazide, POTASSIUM CHLORIDE PO, Besifloxacin HCl, and Vitamin D (Ergocalciferol).  Meds ordered this encounter  Medications  . Mometasone Furoate (ASMANEX HFA) 200 MCG/ACT AERO    Sig: Inhale 1 puff into the lungs 2 (two) times daily.    Dispense:  13 g    Refill:  11  . esomeprazole (NEXIUM) 40 MG capsule    Sig: Take 1 capsule (40 mg total) by mouth daily at 12 noon.    Dispense:  90 capsule    Refill:  1     Follow-up: Return in about 3 months (around 01/21/2018).  [name], MD

## 2017-10-23 ENCOUNTER — Other Ambulatory Visit: Payer: Self-pay | Admitting: Internal Medicine

## 2017-10-27 ENCOUNTER — Inpatient Hospital Stay: Admission: RE | Admit: 2017-10-27 | Payer: BLUE CROSS/BLUE SHIELD | Source: Ambulatory Visit

## 2017-11-16 ENCOUNTER — Ambulatory Visit (HOSPITAL_COMMUNITY): Payer: BLUE CROSS/BLUE SHIELD

## 2017-11-17 ENCOUNTER — Ambulatory Visit (HOSPITAL_COMMUNITY): Payer: BLUE CROSS/BLUE SHIELD

## 2017-11-19 ENCOUNTER — Encounter: Payer: Self-pay | Admitting: Pulmonary Disease

## 2017-11-19 ENCOUNTER — Other Ambulatory Visit: Payer: BLUE CROSS/BLUE SHIELD

## 2017-11-19 ENCOUNTER — Ambulatory Visit: Payer: BLUE CROSS/BLUE SHIELD | Admitting: Pulmonary Disease

## 2017-11-19 VITALS — BP 124/82 | HR 87 | Ht 63.0 in | Wt 262.0 lb

## 2017-11-19 DIAGNOSIS — R911 Solitary pulmonary nodule: Secondary | ICD-10-CM | POA: Diagnosis not present

## 2017-11-19 DIAGNOSIS — G4733 Obstructive sleep apnea (adult) (pediatric): Secondary | ICD-10-CM | POA: Diagnosis not present

## 2017-11-19 DIAGNOSIS — R918 Other nonspecific abnormal finding of lung field: Secondary | ICD-10-CM | POA: Diagnosis not present

## 2017-11-19 NOTE — Assessment & Plan Note (Signed)
Prescription for new auto CPAP 10 to 15 cm will be sent to DME.  Given her problems with pressure sores on the bridge of her nose we will provide her with an air fit F 30 fullface mask that hopefully will help her We will check download in 1 month to tweak settings if necessary and to ensure compliance  Weight loss encouraged, compliance with goal of at least 4-6 hrs every night is the expectation. Advised against medications with sedative side effects Cautioned against driving when sleepy - understanding that sleepiness will vary on a day to day basis

## 2017-11-19 NOTE — Assessment & Plan Note (Signed)
Unfortunately no priors imaging for comparison.  CT abdomen from 2017 is difficult to compare.  So to presume these are new nodules.  This non-smoker likely to be inflammatory or benign.  Sarcoidosis possible.  she does have secondhand exposure to smoking from her husband.  We will check urine histo antigen is on the place that she has lived at before and also serum ACE level. Will obtain 7570-month follow-up CT chest with contrast.  If any growth in size then will need biopsy.  If stable then may consider serial follow-up

## 2017-11-19 NOTE — Progress Notes (Signed)
Subjective:    Patient ID: [name], female    DOB: [birth year], 62 y.o.   MRN: [id number]  HPI  62 year old never smoker presents for evaluation of abnormal CT scan and to reestablish care for OSA.  OSA was diagnosed in 2006 and she has been maintained on CPAP since then with good results.  She required a full facemask but this consistently caused pressure sores on the bridge of her nose and she has finally settled down with nasal pillows.  She complains of nasal stuffiness and occasional dripping.  Overall CPAP is helped improve her daytime somnolence and fatigue and she reports good compliance with this.  Denies any problems with pressure.  Epworth sleepiness score is 0. 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   She works from 1 PM to 10 PM.  Bedtime is around midnight, sleep latency can be up to 1 to 2 hours, she sleeps on her side with 2 pillows, denies nocturnal awakenings or nocturia and is out of bed by 7 AM to let the dogs out.  Weight is mostly unchanged and stable at 262 pounds. There is no history suggestive of cataplexy, sleep paralysis or parasomnias  She would like to get a new machine since her current machine is old   She developed shortness of breath and cough in October and PCP obtain a chest x-ray which showed right lower lobe 7 mm nodule. CT chest without contrast was obtained 10/2017 which showed multiple subcentimeter nodules scattered throughout both lungs, largest about 7 mm in size.  Chronic elevation right hemidiaphragm was also noted with right basilar atelectasis.  I reviewed CT abdomen from 02/2015 which shows bibasilar atelectasis hence difficult to differentiate nodules.  Chest x-ray from 11/2007 does not show any evidence of these nodules. Mammogram 04/2017 was negative.  She has lived in [place] for 15 years and in [place] for 12 years and [place], [place] for about 2  years   PMH - She required ERCP for acute pancreatitis foll by resp failure requiring tstomy  Lap band sx in 12/2008 , 01/2010 band flipped, required surgery, regained wt lost She underwent TAH + BSO in '92   Significant tests/ events reviewed  PSG 2006 (Wt 264)>> severe OSA with AHI 52/h including 50 obstructive apneas & 81 hypopneas corrected by 14 cm , nadir desatn to 69%.   PSG 02/2011 - 269 lbs - Severe obstructive sleep apnea with hypopneas AHI 56/h causing sleep problems, fragmentation, and oxygen desaturation >> CPAP of 15 cm , small full- face mask     Labs reviewed which shows normal renal function and LFTs  Past Medical History:  Diagnosis Date  . Anxiety disorder   . Chronic cough   . Complication of anesthesia    Had ERCP in 2006 and led to respiratory failure, caused pancreatits, was in coma for 30days.  . Depression   . GERD (gastroesophageal reflux disease)   . History of ERCP   . History of pancreatitis   . History of tracheostomy   . HTN (hypertension)   . Hyperglycemia   . Hypokalemia   . Respiratory failure [facility])     Past Surgical History:  Procedure Laterality Date  . ABDOMINAL HYSTERECTOMY     Total Hysterectomy   . APPENDECTOMY    . BREAST REDUCTION SURGERY    . CHOLECYSTECTOMY    . COLONOSCOPY WITH PROPOFOL N/A 12/21/2015   Procedure: COLONOSCOPY WITH  PROPOFOL;  Surgeon: Jeani Hawking, MD;  Location: [facility] ENDOSCOPY;  Service: Endoscopy;  Laterality: N/A;  . ERCP    . ESOPHAGOGASTRODUODENOSCOPY N/A 12/21/2015   Procedure: ESOPHAGOGASTRODUODENOSCOPY (EGD);  Surgeon: Jeani Hawking, MD;  Location: [facility] ENDOSCOPY;  Service: Endoscopy;  Laterality: N/A;  . GIVENS CAPSULE STUDY N/A 01/29/2016   Procedure: GIVENS CAPSULE STUDY;  Surgeon: Charna Elizabeth, MD;  Location: [facility] ENDOSCOPY;  Service: Endoscopy;  Laterality: N/A;  . HAMMER TOE SURGERY    . INTESTINAL MALROTATION REPAIR  1999  . TUBAL LIGATION      Allergies  Allergen Reactions  . Fastin [Phentermine]      headache  . Lisinopril     REACTION: cough, facial swelling    Social History   Socioeconomic History  . Marital status: Married    Spouse name: Not on file  . Number of children: Not on file  . Years of education: Not on file  . Highest education level: Not on file  Occupational History  . Occupation: Occupational hygienist: [employer]  Social Needs  . Financial resource strain: Not on file  . Food insecurity:    Worry: Not on file    Inability: Not on file  . Transportation needs:    Medical: Not on file    Non-medical: Not on file  Tobacco Use  . Smoking status: Never Smoker  . Smokeless tobacco: Never Used  Substance and Sexual Activity  . Alcohol use: No  . Drug use: No  . Sexual activity: Yes    Birth control/protection: Surgical  Lifestyle  . Physical activity:    Days per week: Not on file    Minutes per session: Not on file  . Stress: Not on file  Relationships  . Social connections:    Talks on phone: Not on file    Gets together: Not on file    Attends religious service: Not on file    Active member of club or organization: Not on file    Attends meetings of clubs or organizations: Not on file    Relationship status: Not on file  . Intimate partner violence:    Fear of current or ex partner: Not on file    Emotionally abused: Not on file    Physically abused: Not on file    Forced sexual activity: Not on file  Other Topics Concern  . Not on file  Social History Narrative   Regular Exercise -  NO      Family History  Problem Relation Age of Onset  . Diabetes Other   . Hypertension Other     Review of Systems Positive for bilateral hip and knee pain especially when she is sitting for long time,  nasal stuffiness, bipedal edema  Constitutional: negative for anorexia, fevers and sweats  Eyes: negative for irritation, redness and visual disturbance  Ears, nose, mouth, throat, and face: negative for earaches, epistaxis, nasal  congestion and sore throat  Respiratory: negative for cough, dyspnea on exertion, sputum and wheezing  Cardiovascular: negative for chest pain, dyspnea,orthopnea, palpitations and syncope  Gastrointestinal: negative for abdominal pain, constipation, diarrhea, melena, nausea and vomiting  Genitourinary:negative for dysuria, frequency and hematuria  Hematologic/lymphatic: negative for bleeding, easy bruising and lymphadenopathy  Musculoskeletal:negative for arthralgias, muscle weakness and stiff joints  Neurological: negative for coordination problems, gait problems, headaches and weakness  Endocrine: negative for diabetic symptoms including polydipsia, polyuria and weight loss     Objective:   Physical Exam  Gen. Pleasant, obese, in no distress, normal affect ENT - no pallor, icterus , no post nasal drip, class 2-3 airway Neck: No JVD, no thyromegaly, no carotid bruits Lungs: no use of accessory muscles, no dullness to percussion, decreased without rales or rhonchi  Cardiovascular: Rhythm regular, heart sounds  normal, no murmurs or gallops, no peripheral edema Abdomen: soft and non-tender, no hepatosplenomegaly, BS normal. Musculoskeletal: No deformities, no cyanosis or clubbing Neuro:  alert, non focal, no tremors       Assessment & Plan:

## 2017-11-19 NOTE — Progress Notes (Signed)
   Subjective:    Patient ID: Sandy MediateJanice Reyes, female    DOB: 09/10/1955, 62 y.o.   MRN: [id number]  HPI    Review of Systems     Objective:   Physical Exam        Assessment & Plan:

## 2017-11-19 NOTE — Patient Instructions (Signed)
You have multiple lung nodules -  Rpt CT chest with contrast in 3 months  Check urine histo antigen Check blood ACE level  Rx for new autoCPAP 10-15 cm with small airfit F 30 FF mask

## 2017-11-20 ENCOUNTER — Other Ambulatory Visit: Payer: Self-pay | Admitting: Internal Medicine

## 2017-11-20 DIAGNOSIS — F418 Other specified anxiety disorders: Secondary | ICD-10-CM

## 2017-11-20 DIAGNOSIS — H26492 Other secondary cataract, left eye: Secondary | ICD-10-CM | POA: Diagnosis not present

## 2017-11-20 DIAGNOSIS — H26491 Other secondary cataract, right eye: Secondary | ICD-10-CM | POA: Diagnosis not present

## 2017-11-20 DIAGNOSIS — Z961 Presence of intraocular lens: Secondary | ICD-10-CM | POA: Diagnosis not present

## 2017-11-23 ENCOUNTER — Telehealth (HOSPITAL_COMMUNITY): Payer: Self-pay | Admitting: *Deleted

## 2017-11-23 LAB — HISTOPLASMA ANTIGEN, URINE: Histoplasma Antigen, urine: <0.5

## 2017-11-23 LAB — ANGIOTENSIN CONVERTING ENZYME: Angiotensin-Converting Enzyme: 50 U/L (ref 9–67)

## 2017-11-23 NOTE — Telephone Encounter (Signed)
Left message on voicemail in reference to upcoming appointment scheduled for 11/25/17. Phone number given for a call back so details instructions can be given. Jaelen Soth, Adelene IdlerCynthia W

## 2017-11-24 ENCOUNTER — Telehealth (HOSPITAL_COMMUNITY): Payer: Self-pay | Admitting: *Deleted

## 2017-11-24 NOTE — Telephone Encounter (Signed)
Patient given detailed instructions per Myocardial Perfusion Study Information Sheet for the test on 11/25/17 at 1245. Patient notified to arrive 15 minutes early and that it is imperative to arrive on time for appointment to keep from having the test rescheduled.  If you need to cancel or reschedule your appointment, please call the office within 24 hours of your appointment. . Patient verbalized understanding.Ladana Chavero, Adelene IdlerCynthia W

## 2017-11-25 ENCOUNTER — Ambulatory Visit (HOSPITAL_COMMUNITY): Payer: BLUE CROSS/BLUE SHIELD | Attending: Cardiology

## 2017-11-25 ENCOUNTER — Telehealth: Payer: Self-pay | Admitting: Pulmonary Disease

## 2017-11-25 ENCOUNTER — Encounter: Payer: BLUE CROSS/BLUE SHIELD | Admitting: *Deleted

## 2017-11-25 DIAGNOSIS — R9431 Abnormal electrocardiogram [ECG] [EKG]: Secondary | ICD-10-CM | POA: Diagnosis not present

## 2017-11-25 DIAGNOSIS — Z006 Encounter for examination for normal comparison and control in clinical research program: Secondary | ICD-10-CM

## 2017-11-25 DIAGNOSIS — R0609 Other forms of dyspnea: Secondary | ICD-10-CM | POA: Diagnosis not present

## 2017-11-25 DIAGNOSIS — R0600 Dyspnea, unspecified: Secondary | ICD-10-CM

## 2017-11-25 MED ORDER — REGADENOSON 0.4 MG/5ML IV SOLN
0.4000 mg | Freq: Once | INTRAVENOUS | Status: AC
  Administered 2017-11-25: 0.4 mg via INTRAVENOUS

## 2017-11-25 MED ORDER — TECHNETIUM TC 99M TETROFOSMIN IV KIT
32.8000 | PACK | Freq: Once | INTRAVENOUS | Status: AC | PRN
  Administered 2017-11-25: 32.8 via INTRAVENOUS
  Filled 2017-11-25: qty 33

## 2017-11-25 NOTE — Telephone Encounter (Signed)
Notes recorded by Raskin, Leslie M, CMA on Christen Butter11/20/2019 at 9:51 AM EST LMTCB ------  Notes recorded by Maurene CapesPotts, Cherina M, CMA on 11/24/2017 at 3:33 PM EST ATC patient but she did not answer and her VM was full. Will ATC back later. ------  Notes recorded by Oretha MilchAlva, Rakesh V, MD on 11/24/2017 at 10:22 AM EST Please let her know that blood tests for histoplasma and sarcoidosis are negative  Attempted to call pt but line went straight to VM. Unable to leave a message due to mailbox being full. Will try to call back later.

## 2017-11-25 NOTE — Research (Signed)
CADFEM Informed Consent                  Subject Name:   [name]   Subject met inclusion and exclusion criteria.  The informed consent form, study requirements and expectations were reviewed with the subject and questions and concerns were addressed prior to the signing of the consent form.  The subject verbalized understanding of the trial requirements.  The subject agreed to participate in the CADFEM trial and signed the informed consent.  The informed consent was obtained prior to performance of any protocol-specific procedures for the subject.  A copy of the signed informed consent was given to the subject and a copy was placed in the subject's medical record.   [place] , Research Assistant 11/25/17 12:12 p.m.

## 2017-11-25 NOTE — Progress Notes (Signed)
LMTCB

## 2017-11-25 NOTE — Telephone Encounter (Signed)
Attempted to contact patient, voicemail remains full.   Notes recorded by Oretha MilchAlva, Rakesh V, MD on 11/24/2017 at 10:22 AM EST Please let her know that blood tests for histoplasma and sarcoidosis are negative

## 2017-11-25 NOTE — Telephone Encounter (Signed)
Attempted to call patient, voicemail is full. Will attempt to call back later.

## 2017-11-25 NOTE — Telephone Encounter (Signed)
Pt is returning call CB# [phone]

## 2017-11-26 ENCOUNTER — Ambulatory Visit (HOSPITAL_COMMUNITY): Payer: BLUE CROSS/BLUE SHIELD | Attending: Cardiology

## 2017-11-26 ENCOUNTER — Encounter: Payer: Self-pay | Admitting: Internal Medicine

## 2017-11-26 LAB — MYOCARDIAL PERFUSION IMAGING
LV dias vol: 72 mL (ref 46–106)
LV sys vol: 23 mL
Peak HR: 110 {beats}/min
Rest HR: 90 {beats}/min
SDS: 5
SRS: 0
SSS: 5
TID: 0.82

## 2017-11-26 MED ORDER — TECHNETIUM TC 99M TETROFOSMIN IV KIT
32.7000 | PACK | Freq: Once | INTRAVENOUS | Status: AC | PRN
  Administered 2017-11-26: 32.7 via INTRAVENOUS
  Filled 2017-11-26: qty 33

## 2017-11-26 NOTE — Telephone Encounter (Signed)
Attempted to call pt but unable to reach her. Left message for pt to return call. 

## 2017-11-26 NOTE — Telephone Encounter (Signed)
Spoke with pt. She is aware of results. Nothing further was needed.  

## 2017-11-26 NOTE — Telephone Encounter (Signed)
Pt is calling back [phone]

## 2017-11-28 DIAGNOSIS — H2513 Age-related nuclear cataract, bilateral: Secondary | ICD-10-CM | POA: Diagnosis not present

## 2017-12-01 DIAGNOSIS — G4733 Obstructive sleep apnea (adult) (pediatric): Secondary | ICD-10-CM | POA: Diagnosis not present

## 2017-12-07 DIAGNOSIS — H26491 Other secondary cataract, right eye: Secondary | ICD-10-CM | POA: Diagnosis not present

## 2017-12-20 DIAGNOSIS — H26493 Other secondary cataract, bilateral: Secondary | ICD-10-CM | POA: Diagnosis not present

## 2017-12-23 ENCOUNTER — Other Ambulatory Visit: Payer: Self-pay | Admitting: Internal Medicine

## 2017-12-23 DIAGNOSIS — F418 Other specified anxiety disorders: Secondary | ICD-10-CM

## 2017-12-31 DIAGNOSIS — G4733 Obstructive sleep apnea (adult) (pediatric): Secondary | ICD-10-CM | POA: Diagnosis not present

## 2018-01-15 ENCOUNTER — Other Ambulatory Visit: Payer: Self-pay | Admitting: Internal Medicine

## 2018-01-20 ENCOUNTER — Encounter: Payer: Self-pay | Admitting: Internal Medicine

## 2018-01-20 ENCOUNTER — Ambulatory Visit (INDEPENDENT_AMBULATORY_CARE_PROVIDER_SITE_OTHER)
Admission: RE | Admit: 2018-01-20 | Discharge: 2018-01-20 | Disposition: A | Discharge status: Home / Self Care | Payer: BLUE CROSS/BLUE SHIELD | Source: Ambulatory Visit | Attending: Internal Medicine | Admitting: Internal Medicine

## 2018-01-20 ENCOUNTER — Ambulatory Visit: Payer: BLUE CROSS/BLUE SHIELD | Admitting: Internal Medicine

## 2018-01-20 ENCOUNTER — Other Ambulatory Visit (INDEPENDENT_AMBULATORY_CARE_PROVIDER_SITE_OTHER): Payer: BLUE CROSS/BLUE SHIELD

## 2018-01-20 VITALS — BP 130/80 | HR 77 | Temp 98.2°F | Ht 63.0 in | Wt 258.0 lb

## 2018-01-20 DIAGNOSIS — Z9884 Bariatric surgery status: Secondary | ICD-10-CM

## 2018-01-20 DIAGNOSIS — R10816 Epigastric abdominal tenderness: Secondary | ICD-10-CM

## 2018-01-20 DIAGNOSIS — D510 Vitamin B12 deficiency anemia due to intrinsic factor deficiency: Secondary | ICD-10-CM

## 2018-01-20 DIAGNOSIS — R739 Hyperglycemia, unspecified: Secondary | ICD-10-CM

## 2018-01-20 DIAGNOSIS — I10 Essential (primary) hypertension: Secondary | ICD-10-CM

## 2018-01-20 DIAGNOSIS — R112 Nausea with vomiting, unspecified: Secondary | ICD-10-CM

## 2018-01-20 DIAGNOSIS — R110 Nausea: Secondary | ICD-10-CM | POA: Diagnosis not present

## 2018-01-20 LAB — CBC WITH DIFFERENTIAL/PLATELET
Basophils Absolute: 0.1 10*3/uL (ref 0.0–0.1)
Basophils Relative: 0.9 % (ref 0.0–3.0)
Eosinophils Absolute: 0.1 10*3/uL (ref 0.0–0.7)
Eosinophils Relative: 1.6 % (ref 0.0–5.0)
HCT: 37.7 % (ref 36.0–46.0)
Hemoglobin: 12.3 g/dL (ref 12.0–15.0)
Lymphocytes Relative: 23.9 % (ref 12.0–46.0)
Lymphs Abs: 2.3 10*3/uL (ref 0.7–4.0)
MCHC: 32.6 g/dL (ref 30.0–36.0)
MCV: 84.9 fl (ref 78.0–100.0)
Monocytes Absolute: 0.6 10*3/uL (ref 0.1–1.0)
Monocytes Relative: 6.1 % (ref 3.0–12.0)
Neutro Abs: 6.4 10*3/uL (ref 1.4–7.7)
Neutrophils Relative %: 67.5 % (ref 43.0–77.0)
Platelets: 329 10*3/uL (ref 150.0–400.0)
RBC: 4.44 Mil/uL (ref 3.87–5.11)
RDW: 14.9 % (ref 11.5–15.5)
WBC: 9.5 10*3/uL (ref 4.0–10.5)

## 2018-01-20 LAB — URINALYSIS, ROUTINE W REFLEX MICROSCOPIC
Bilirubin Urine: NEGATIVE
Hgb urine dipstick: NEGATIVE
Ketones, ur: NEGATIVE
Leukocytes, UA: NEGATIVE
Nitrite: NEGATIVE
RBC / HPF: NONE SEEN (ref 0–?)
Specific Gravity, Urine: 1.025 (ref 1.000–1.030)
Total Protein, Urine: NEGATIVE
Urine Glucose: NEGATIVE
Urobilinogen, UA: 0.2 (ref 0.0–1.0)
pH: 6 (ref 5.0–8.0)

## 2018-01-20 LAB — COMPREHENSIVE METABOLIC PANEL
ALT: 16 U/L (ref 0–35)
AST: 15 U/L (ref 0–37)
Albumin: 4.2 g/dL (ref 3.5–5.2)
Alkaline Phosphatase: 106 U/L (ref 39–117)
BUN: 14 mg/dL (ref 6–23)
CO2: 29 mEq/L (ref 19–32)
Calcium: 9.7 mg/dL (ref 8.4–10.5)
Chloride: 99 mEq/L (ref 96–112)
Creatinine, Ser: 0.89 mg/dL (ref 0.40–1.20)
GFR: 82.56 mL/min (ref >60.00)
Glucose, Bld: 102 mg/dL — ABNORMAL HIGH (ref 70–99)
Potassium: 3.6 mEq/L (ref 3.5–5.1)
Sodium: 137 mEq/L (ref 135–145)
Total Bilirubin: 0.3 mg/dL (ref 0.2–1.2)
Total Protein: 7.8 g/dL (ref 6.0–8.3)

## 2018-01-20 LAB — AMYLASE: Amylase: 61 U/L (ref 27–131)

## 2018-01-20 LAB — VITAMIN B12: Vitamin B-12: 732 pg/mL (ref 211–911)

## 2018-01-20 LAB — LIPASE: Lipase: 34 U/L (ref 11.0–59.0)

## 2018-01-20 LAB — FOLATE: Folate: 6.9 ng/mL (ref >5.9)

## 2018-01-20 LAB — HEMOGLOBIN A1C: Hgb A1c MFr Bld: 5.9 % (ref 4.6–6.5)

## 2018-01-20 NOTE — Progress Notes (Signed)
Subjective:  Patient ID: [name], female    DOB: 01/21/1955  Age: 63 y.o. MRN: [id number]  CC: Anemia; Hypertension; and Abdominal Pain   HPI Sandy MediateJanice Scarpelli presents for f/up - She complains of a one-month history of nausea, dry heaves, and unexpected weight loss.  Outpatient Medications Prior to Visit  Medication Sig Dispense Refill  . azelastine (ASTELIN) 0.1 % nasal spray PLACE 1 SPRAY INTO BOTH NOSTRILS 2 (TWO) TIMES DAILY. USE IN EACH NOSTRIL AS DIRECTED 30 mL 1  . Besifloxacin HCl (BESIVANCE) 0.6 % SUSP Besivance 0.6 % eye drops,suspension    . CVS VITAMIN B12 2000 MCG tablet Take 2,000 mcg by mouth daily.  3  . DULoxetine (CYMBALTA) 30 MG capsule TAKE 1 CAPSULE (30 MG TOTAL) DAILY BY MOUTH. 90 capsule 1  . esomeprazole (NEXIUM) 40 MG capsule Take 1 capsule (40 mg total) by mouth daily at 12 noon. 90 capsule 1  . mometasone (NASONEX) 50 MCG/ACT nasal spray Place 4 sprays into the nose daily. 51 g 3  . Mometasone Furoate (ASMANEX HFA) 200 MCG/ACT AERO Inhale 1 puff into the lungs 2 (two) times daily. 13 g 11  . montelukast (SINGULAIR) 10 MG tablet Take 1 tablet (10 mg total) at bedtime by mouth. 90 tablet 3  . POTASSIUM CHLORIDE PO Take by mouth.    . potassium chloride SA (K-DUR,KLOR-CON) 20 MEQ tablet Take 1 tablet (20 mEq total) by mouth daily. 90 tablet 1  . triamterene-hydrochlorothiazide (MAXZIDE-25) 37.5-25 MG tablet TAKE 1 TABLET BY MOUTH EVERY DAY 90 tablet 1  . Vitamin D, Ergocalciferol, (DRISDOL) 50000 units CAPS capsule TAKE ONE CAPSULE BY MOUTH ONE TIME PER WEEK 12 capsule 1   No facility-administered medications prior to visit.     ROS Review of Systems  Constitutional: Positive for appetite change and unexpected weight change. Negative for diaphoresis and fatigue.  HENT: Negative for trouble swallowing.   Respiratory: Negative for cough, chest tightness and shortness of breath.   Cardiovascular: Negative for chest pain, palpitations and leg swelling.    Gastrointestinal: Positive for nausea and vomiting. Negative for abdominal pain, constipation and diarrhea.  Genitourinary: Negative.  Negative for decreased urine volume, difficulty urinating, flank pain, hematuria and urgency.  Musculoskeletal: Negative.  Negative for arthralgias and myalgias.  Skin: Negative.  Negative for color change and rash.  Neurological: Negative.  Negative for dizziness.  Hematological: Negative for adenopathy. Does not bruise/bleed easily.  Psychiatric/Behavioral: Negative.     Objective:  BP 130/80 (BP Location: Left Arm, Patient Position: Sitting, Cuff Size: Large)   Pulse 77   Temp 98.2 F (36.8 C) (Oral)   Ht 5\' 3"  (1.6 m)   Wt 258 lb (117 kg)   SpO2 94%   BMI 45.70 kg/m   BP Readings from Last 3 Encounters:  01/20/18 130/80  11/19/17 124/82  10/21/17 140/70    Wt Readings from Last 3 Encounters:  01/20/18 258 lb (117 kg)  11/25/17 264 lb (119.7 kg)  11/19/17 262 lb (118.8 kg)    Physical Exam Constitutional:      General: She is not in acute distress.    Appearance: She is obese. She is not ill-appearing, toxic-appearing or diaphoretic.  HENT:     Mouth/Throat:     Mouth: Mucous membranes are moist.     Pharynx: No pharyngeal swelling or oropharyngeal exudate.  Eyes:     General: No scleral icterus.    Conjunctiva/sclera: Conjunctivae normal.  Neck:     Musculoskeletal: Normal range  of motion and neck supple. No muscular tenderness.  Cardiovascular:     Rate and Rhythm: Normal rate and regular rhythm.     Heart sounds: No murmur. No friction rub. No gallop.   Pulmonary:     Effort: Pulmonary effort is normal.     Breath sounds: No stridor. No wheezing, rhonchi or rales.  Abdominal:     General: Bowel sounds are decreased.     Palpations: Abdomen is soft. There is no hepatomegaly, splenomegaly or mass.     Tenderness: There is abdominal tenderness in the epigastric area. There is no guarding or rebound. Negative signs include  Murphy's sign and McBurney's sign.     Hernia: No hernia is present.  Musculoskeletal: Normal range of motion.        General: No swelling.     Right lower leg: No edema.  Lymphadenopathy:     Cervical: No cervical adenopathy.  Skin:    General: Skin is warm and dry.     Findings: No rash.  Neurological:     General: No focal deficit present.     Mental Status: She is oriented to person, place, and time. Mental status is at baseline.  Psychiatric:        Mood and Affect: Mood normal.        Behavior: Behavior normal.        Thought Content: Thought content normal.        Judgment: Judgment normal.     Lab Results  Component Value Date   WBC 9.5 01/20/2018   HGB 12.3 01/20/2018   HCT 37.7 01/20/2018   PLT 329.0 01/20/2018   GLUCOSE 102 (H) 01/20/2018   CHOL 180 10/13/2017   TRIG 133.0 10/13/2017   HDL 50.90 10/13/2017   LDLCALC 103 (H) 10/13/2017   ALT 16 01/20/2018   AST 15 01/20/2018   NA 137 01/20/2018   K 3.6 01/20/2018   CL 99 01/20/2018   CREATININE 0.89 01/20/2018   BUN 14 01/20/2018   CO2 29 01/20/2018   TSH 1.62 10/13/2017   HGBA1C 5.9 01/20/2018    Ct Chest Wo Contrast  Result Date: 10/20/2017 CLINICAL DATA:  Further evaluation of right lung nodule on recent chest radiographs. EXAM: CT CHEST WITHOUT CONTRAST TECHNIQUE: Multidetector CT imaging of the chest was performed following the standard protocol without IV contrast. COMPARISON:  Chest radiographs 10/13/2017. CT abdomen and pelvis 02/20/2015. Chest CT 08/16/2004. FINDINGS: Cardiovascular: Mild thoracic aortic atherosclerosis without aneurysm. Normal heart size. No pericardial effusion. Mediastinum/Nodes: Possible small sliding hiatal hernia. Unremarkable thyroid. No enlarged axillary, mediastinal, or hilar lymph nodes identified within limitations of noncontrast technique. Lungs/Pleura: No pleural effusion or pneumothorax. Chronic elevation of the right hemidiaphragm with associated right basilar  subsegmental atelectasis. Mild lingular atelectasis. There are approximately 20 subcentimeter lung nodules involving the right greater than left lungs. Due to motion artifact and atelectasis in the lung bases on the 2017 abdominal CT and motion artifact and lung consolidation on the 2006 chest CT, it cannot be determined whether any of these nodules were present previously. The nodules are annotated on series 3 with the largest nodule measuring 7 mm in the right lower lobe (image 62). Upper Abdomen: Status post cholecystectomy and laparoscopic gastric banding. Musculoskeletal: No suspicious osseous lesion. Moderately advanced thoracic disc degeneration. IMPRESSION: 1. Approximately 20 lung nodules measuring up to 7 mm in size. Non-contrast chest CT at 3-6 months is recommended. If the nodules are stable at time of repeat CT, then future  CT at 18-24 months (from today's scan) is considered optional for low-risk patients, but is recommended for high-risk patients. This recommendation follows the consensus statement: Guidelines for Management of Incidental Pulmonary Nodules Detected on CT Images: From the Fleischner Society 2017; Radiology 2017; 284:228-243. 2. Chronic elevation of the right hemidiaphragm with right basilar atelectasis. 3. Aortic Atherosclerosis (ICD10-I70.0). Electronically Signed   By: Sebastian AcheAllen  Grady M.D.   On: 10/20/2017 08:35   Dg Acute Abd 2+v Abd (supine,erect,decub) + 1v Chest  Result Date: 01/20/2018 CLINICAL DATA:  Nausea EXAM: DG ABDOMEN ACUTE W/ 1V CHEST COMPARISON:  Abdominal radiographs February 22, 2015; chest radiograph October 13, 2017; chest CT October 19, 2017 FINDINGS: PA chest: There is mild scarring in the right base. There is no edema or consolidation. Heart is upper normal in size with pulmonary vascularity normal. No adenopathy. Supine and upright abdomen: There is a lap band positioned at the gastric cardia level. There is moderate stool throughout the colon. There is no bowel  dilatation or air-fluid level to suggest bowel obstruction. No free air. There are surgical clips in the right upper quadrant region. IMPRESSION: Lap band positioned at the gastric cardia level. No bowel obstruction or free air evident. Moderate stool in colon. Scarring right lung base, stable. No edema or consolidation. Stable cardiac silhouette. Electronically Signed   By: Bretta BangWilliam  Woodruff III M.D.   On: 01/20/2018 08:50    Assessment & Plan:   [name] was seen today for anemia, hypertension and abdominal pain.  Diagnoses and all orders for this visit:  Essential hypertension- Her blood pressure is adequately well controlled. -     CBC with Differential/Platelet; Future -     Comprehensive metabolic panel; Future -     Urinalysis, Routine w reflex microscopic; Future  Vitamin B12 deficiency anemia due to intrinsic factor deficiency -     Lipase; Future -     Vitamin B12; Future -     Folate; Future  Hyperglycemia- Her A1c is at 5.9%.  Medical therapy is not indicated. -     Comprehensive metabolic panel; Future -     Hemoglobin A1c; Future  Epigastric abdominal tenderness without rebound tenderness- She has concerning symptoms and there is some tenderness on examination but there is no evidence of an acute abdominal process.  Her x-rays and lab work are all reassuring.  I have asked her to follow-up with either GI or her bariatric surgeon to see if she is having a complication from the LAP-BAND. -     DG ACUTE ABD 2+V ABD (SUPINE,ERECT,DECUB) + 1V CHEST; Future -     Amylase; Future -     Lipase; Future -     CBC with Differential/Platelet; Future -     Comprehensive metabolic panel; Future -     Urinalysis, Routine w reflex microscopic; Future -     Ambulatory referral to General Surgery  LAP-BAND surgery status -     Ambulatory referral to General Surgery -     Ambulatory referral to Gastroenterology  Non-intractable vomiting with nausea, unspecified vomiting type -      Ambulatory referral to Gastroenterology   I am having Sandy MediateJanice Harty maintain her potassium chloride SA, CVS VITAMIN B12, mometasone, montelukast, azelastine, POTASSIUM CHLORIDE PO, Besifloxacin HCl, Vitamin D (Ergocalciferol), Mometasone Furoate, esomeprazole, DULoxetine, and triamterene-hydrochlorothiazide.  No orders of the defined types were placed in this encounter.    Follow-up: Return in about 3 weeks (around 02/10/2018).  Sanda Lingerhomas Jones, MD

## 2018-01-20 NOTE — Patient Instructions (Signed)
Abdominal Pain, Adult  Abdominal pain can be caused by many things. Often, abdominal pain is not serious and it gets better with no treatment or by being treated at home. However, sometimes abdominal pain is serious. Your health care provider will do a medical history and a physical exam to try to determine the cause of your abdominal pain.  Follow these instructions at home:   Take over-the-counter and prescription medicines only as told by your health care provider. Do not take a laxative unless told by your health care provider.   Drink enough fluid to keep your urine clear or pale yellow.   Watch your condition for any changes.   Keep all follow-up visits as told by your health care provider. This is important.  Contact a health care provider if:   Your abdominal pain changes or gets worse.   You are not hungry or you lose weight without trying.   You are constipated or have diarrhea for more than 2-3 days.   You have pain when you urinate or have a bowel movement.   Your abdominal pain wakes you up at night.   Your pain gets worse with meals, after eating, or with certain foods.   You are throwing up and cannot keep anything down.   You have a fever.  Get help right away if:   Your pain does not go away as soon as your health care provider told you to expect.   You cannot stop throwing up.   Your pain is only in areas of the abdomen, such as the right side or the left lower portion of the abdomen.   You have bloody or black stools, or stools that look like tar.   You have severe pain, cramping, or bloating in your abdomen.   You have signs of dehydration, such as:  ? Dark urine, very little urine, or no urine.  ? Cracked lips.  ? Dry mouth.  ? Sunken eyes.  ? Sleepiness.  ? Weakness.  This information is not intended to replace advice given to you by your health care provider. Make sure you discuss any questions you have with your health care provider.  Document Released: 10/02/2004 Document  Revised: 07/13/2015 Document Reviewed: 06/06/2015  Elsevier Interactive Patient Education  2019 [employer]

## 2018-01-21 DIAGNOSIS — R1110 Vomiting, unspecified: Secondary | ICD-10-CM | POA: Insufficient documentation

## 2018-01-31 DIAGNOSIS — G4733 Obstructive sleep apnea (adult) (pediatric): Secondary | ICD-10-CM | POA: Diagnosis not present

## 2018-02-11 ENCOUNTER — Other Ambulatory Visit: Payer: Self-pay | Admitting: Internal Medicine

## 2018-02-11 DIAGNOSIS — R918 Other nonspecific abnormal finding of lung field: Secondary | ICD-10-CM

## 2018-02-11 DIAGNOSIS — R911 Solitary pulmonary nodule: Secondary | ICD-10-CM

## 2018-02-16 ENCOUNTER — Ambulatory Visit: Payer: BLUE CROSS/BLUE SHIELD | Admitting: Internal Medicine

## 2018-02-22 ENCOUNTER — Ambulatory Visit (INDEPENDENT_AMBULATORY_CARE_PROVIDER_SITE_OTHER)
Admission: RE | Admit: 2018-02-22 | Discharge: 2018-02-22 | Disposition: A | Discharge status: Home / Self Care | Payer: BLUE CROSS/BLUE SHIELD | Source: Ambulatory Visit | Attending: Pulmonary Disease | Admitting: Pulmonary Disease

## 2018-02-22 DIAGNOSIS — R918 Other nonspecific abnormal finding of lung field: Secondary | ICD-10-CM | POA: Diagnosis not present

## 2018-02-22 DIAGNOSIS — R911 Solitary pulmonary nodule: Secondary | ICD-10-CM

## 2018-02-22 MED ORDER — IOPAMIDOL (ISOVUE-300) INJECTION 61%
80.0000 mL | Freq: Once | INTRAVENOUS | Status: AC | PRN
  Administered 2018-02-22: 80 mL via INTRAVENOUS

## 2018-02-25 ENCOUNTER — Other Ambulatory Visit: Payer: Self-pay | Admitting: Internal Medicine

## 2018-02-25 DIAGNOSIS — J301 Allergic rhinitis due to pollen: Secondary | ICD-10-CM

## 2018-03-03 DIAGNOSIS — G4733 Obstructive sleep apnea (adult) (pediatric): Secondary | ICD-10-CM | POA: Diagnosis not present

## 2018-03-05 DIAGNOSIS — K219 Gastro-esophageal reflux disease without esophagitis: Secondary | ICD-10-CM | POA: Diagnosis not present

## 2018-03-05 DIAGNOSIS — J45909 Unspecified asthma, uncomplicated: Secondary | ICD-10-CM | POA: Insufficient documentation

## 2018-03-05 DIAGNOSIS — Z9884 Bariatric surgery status: Secondary | ICD-10-CM | POA: Diagnosis not present

## 2018-03-05 DIAGNOSIS — I10 Essential (primary) hypertension: Secondary | ICD-10-CM | POA: Diagnosis not present

## 2018-03-09 ENCOUNTER — Ambulatory Visit: Payer: BLUE CROSS/BLUE SHIELD | Admitting: Internal Medicine

## 2018-03-09 ENCOUNTER — Encounter: Payer: Self-pay | Admitting: Internal Medicine

## 2018-03-09 VITALS — BP 122/70 | HR 96 | Temp 98.3°F | Ht 63.0 in | Wt 257.2 lb

## 2018-03-09 DIAGNOSIS — I10 Essential (primary) hypertension: Secondary | ICD-10-CM | POA: Diagnosis not present

## 2018-03-09 DIAGNOSIS — L301 Dyshidrosis [pompholyx]: Secondary | ICD-10-CM | POA: Diagnosis not present

## 2018-03-09 MED ORDER — CRISABOROLE 2 % EX OINT: 1.0000 | TOPICAL_OINTMENT | Freq: Two times a day (BID) | CUTANEOUS | 5 refills | Status: DC

## 2018-03-09 NOTE — Progress Notes (Signed)
Subjective:  Patient ID: [name], female    DOB: 17-Mar-1955  Age: 63 y.o. MRN: [id number]  CC: Rash and Hypertension   HPI [name] presents for f/up - Since I last saw her she has seen a bariatric surgeon for the nausea and tells me that she will be undergoing an upper GI series later this week.  She says the nausea has resolved and she denies any recent episodes of abdominal pain, loss of appetite, or vomiting.  She complains of an itchy rash on the back of her left calf.  She has had this off and on for the last 10 years.  It was previously treated with an unspecified topical agents.  Outpatient Medications Prior to Visit  Medication Sig Dispense Refill  . azelastine (ASTELIN) 0.1 % nasal spray PLACE 1 SPRAY INTO BOTH NOSTRILS 2 (TWO) TIMES DAILY. USE IN EACH NOSTRIL AS DIRECTED 30 mL 1  . Besifloxacin HCl (BESIVANCE) 0.6 % SUSP Besivance 0.6 % eye drops,suspension    . [facility] VITAMIN B12 2000 MCG tablet Take 2,000 mcg by mouth daily.  3  . DULoxetine (CYMBALTA) 30 MG capsule TAKE 1 CAPSULE (30 MG TOTAL) DAILY BY MOUTH. 90 capsule 1  . esomeprazole (NEXIUM) 40 MG capsule Take 1 capsule (40 mg total) by mouth daily at 12 noon. 90 capsule 1  . mometasone (NASONEX) 50 MCG/ACT nasal spray Place 4 sprays into the nose daily. 51 g 3  . Mometasone Furoate (ASMANEX HFA) 200 MCG/ACT AERO Inhale 1 puff into the lungs 2 (two) times daily. 13 g 11  . montelukast (SINGULAIR) 10 MG tablet TAKE 1 TABLET BY MOUTH EVERYDAY AT BEDTIME 90 tablet 1  . POTASSIUM CHLORIDE PO Take by mouth.    . potassium chloride SA (K-DUR,KLOR-CON) 20 MEQ tablet Take 1 tablet (20 mEq total) by mouth daily. 90 tablet 1  . triamterene-hydrochlorothiazide (MAXZIDE-25) 37.5-25 MG tablet TAKE 1 TABLET BY MOUTH EVERY DAY 90 tablet 1  . Vitamin D, Ergocalciferol, (DRISDOL) 50000 units CAPS capsule TAKE ONE CAPSULE BY MOUTH ONE TIME PER WEEK 12 capsule 1   No facility-administered medications prior to visit.      ROS Review of Systems  Constitutional: Negative.  Negative for diaphoresis, fatigue and unexpected weight change.  HENT: Negative.   Eyes: Negative for visual disturbance.  Respiratory: Negative for cough, chest tightness, shortness of breath and wheezing.   Cardiovascular: Negative for chest pain, palpitations and leg swelling.  Gastrointestinal: Negative for abdominal pain, constipation, nausea and vomiting.  Genitourinary: Negative.  Negative for difficulty urinating.  Musculoskeletal: Negative.  Negative for arthralgias, back pain and myalgias.  Skin: Positive for rash. Negative for color change.  Neurological: Negative for dizziness, weakness and light-headedness.  Hematological: Negative for adenopathy. Does not bruise/bleed easily.  Psychiatric/Behavioral: Negative.     Objective:  BP 122/70 (BP Location: Left Arm, Patient Position: Sitting, Cuff Size: Large)   Pulse 96   Temp 98.3 F (36.8 C) (Oral)   Ht  (1.6 m)   Wt 257 lb 4 oz (116.7 kg)   SpO2 93%   BMI 45.57 kg/m   BP Readings from Last 3 Encounters:  03/09/18 122/70  01/20/18 130/80  11/19/17 124/82    Wt Readings from Last 3 Encounters:  03/09/18 257 lb 4 oz (116.7 kg)  01/20/18 258 lb (117 kg)  11/25/17 264 lb (119.7 kg)    Physical Exam Constitutional:      Appearance: She is obese. She is not ill-appearing or diaphoretic.  HENT:     Nose: Nose normal. No congestion or rhinorrhea.     Mouth/Throat:     Mouth: Mucous membranes are moist.     Pharynx: Oropharynx is clear. No oropharyngeal exudate or posterior oropharyngeal erythema.  Eyes:     General: No scleral icterus.    Conjunctiva/sclera: Conjunctivae normal.  Neck:     Musculoskeletal: Normal range of motion and neck supple.  Cardiovascular:     Rate and Rhythm: Normal rate and regular rhythm.     Pulses: Normal pulses.  Pulmonary:     Effort: Pulmonary effort is normal.     Breath sounds: Normal breath sounds. No wheezing or  rales.  Abdominal:     General: Bowel sounds are normal.     Palpations: There is no mass.     Tenderness: There is no abdominal tenderness. There is no guarding.  Musculoskeletal: Normal range of motion.        General: No swelling.     Right lower leg: No edema.       Legs:  Skin:    Findings: Rash present.  Neurological:     General: No focal deficit present.     Mental Status: She is oriented to person, place, and time.     Lab Results  Component Value Date   WBC 9.5 01/20/2018   HGB 12.3 01/20/2018   HCT 37.7 01/20/2018   PLT 329.0 01/20/2018   GLUCOSE 102 (H) 01/20/2018   CHOL 180 10/13/2017   TRIG 133.0 10/13/2017   HDL 50.90 10/13/2017   LDLCALC 103 (H) 10/13/2017   ALT 16 01/20/2018   AST 15 01/20/2018   NA 137 01/20/2018   K 3.6 01/20/2018   CL 99 01/20/2018   CREATININE 0.89 01/20/2018   BUN 14 01/20/2018   CO2 29 01/20/2018   TSH 1.62 10/13/2017   HGBA1C 5.9 01/20/2018    Ct Chest W Contrast  Result Date: 02/22/2018 CLINICAL DATA:  Follow-up lung nodules. EXAM: CT CHEST WITH CONTRAST TECHNIQUE: Multidetector CT imaging of the chest was performed during intravenous contrast administration. CONTRAST:  55mL ISOVUE-300 IOPAMIDOL (ISOVUE-300) INJECTION 61% COMPARISON:  CT scan October 19, 2017 FINDINGS: Cardiovascular: No significant vascular findings. Normal heart size. No pericardial effusion. Mediastinum/Nodes: The lap band is stable. Esophagus is unremarkable. Thyroid is normal. No adenopathy. No effusions. Lungs/Pleura: Central airways are normal. No pneumothorax. Numerous bilateral pulmonary nodules again identified. The representative nodule seen on series 3, image 66 measures 7 mm on today's study and the previous study. The remainder of the nodules are stable as well. No new nodules, masses, or infiltrates. Atelectasis or scar is seen in the right base, similar in the interval. Upper Abdomen: No acute abnormality. Musculoskeletal: No chest wall abnormality.  No acute or significant osseous findings. IMPRESSION: 1. Numerous nodules throughout both lungs, right greater than left, are stable. The largest nodule measures 7 mm on series 3, image 66. A repeat CT scan is optional in a low risk patient in 14-20 months. A CT scan is recommended in high risk patients in 14-20 months. 2. Stable lap band. Electronically Signed   By: Gerome Sam III M.D   On: 02/22/2018 12:59    Assessment & Plan:   Azka was seen today for rash and hypertension.  Diagnoses and all orders for this visit:  Essential hypertension- Her blood pressure is adequately well controlled.  Dyshidrotic eczema -     Crisaborole (EUCRISA) 2 % OINT; Apply 1 Act topically 2 (two)  times daily.   I am having [name] start on [place]. I am also having her maintain her potassium chloride SA, CVS VITAMIN B12, mometasone, azelastine, POTASSIUM CHLORIDE PO, Besifloxacin HCl, Vitamin D (Ergocalciferol), Mometasone Furoate, esomeprazole, DULoxetine, triamterene-hydrochlorothiazide, and montelukast.  Meds ordered this encounter  Medications  . Crisaborole (EUCRISA) 2 % OINT    Sig: Apply 1 Act topically 2 (two) times daily.    Dispense:  60 g    Refill:  5     Follow-up: Return in about 3 months (around 06/09/2018).  [name], MD

## 2018-03-09 NOTE — Patient Instructions (Signed)

## 2018-03-10 DIAGNOSIS — Z9884 Bariatric surgery status: Secondary | ICD-10-CM | POA: Diagnosis not present

## 2018-03-10 DIAGNOSIS — K449 Diaphragmatic hernia without obstruction or gangrene: Secondary | ICD-10-CM | POA: Diagnosis not present

## 2018-03-24 DIAGNOSIS — Z713 Dietary counseling and surveillance: Secondary | ICD-10-CM | POA: Diagnosis not present

## 2018-03-24 DIAGNOSIS — Z9884 Bariatric surgery status: Secondary | ICD-10-CM | POA: Diagnosis not present

## 2018-03-24 DIAGNOSIS — Z6841 Body Mass Index (BMI) 40.0 and over, adult: Secondary | ICD-10-CM | POA: Diagnosis not present

## 2018-03-27 ENCOUNTER — Other Ambulatory Visit: Payer: Self-pay | Admitting: Internal Medicine

## 2018-03-27 DIAGNOSIS — E559 Vitamin D deficiency, unspecified: Secondary | ICD-10-CM

## 2018-04-01 DIAGNOSIS — G4733 Obstructive sleep apnea (adult) (pediatric): Secondary | ICD-10-CM | POA: Diagnosis not present

## 2018-05-02 DIAGNOSIS — G4733 Obstructive sleep apnea (adult) (pediatric): Secondary | ICD-10-CM | POA: Diagnosis not present

## 2018-05-10 ENCOUNTER — Other Ambulatory Visit: Payer: Self-pay | Admitting: Internal Medicine

## 2018-05-10 DIAGNOSIS — G4733 Obstructive sleep apnea (adult) (pediatric): Secondary | ICD-10-CM | POA: Diagnosis not present

## 2018-05-10 DIAGNOSIS — K2100 Gastro-esophageal reflux disease with esophagitis, without bleeding: Secondary | ICD-10-CM

## 2018-06-06 ENCOUNTER — Telehealth: Payer: Self-pay | Admitting: Internal Medicine

## 2018-06-06 ENCOUNTER — Other Ambulatory Visit: Payer: Self-pay | Admitting: Internal Medicine

## 2018-06-06 NOTE — Telephone Encounter (Signed)
done

## 2018-06-09 ENCOUNTER — Ambulatory Visit: Payer: BLUE CROSS/BLUE SHIELD | Admitting: Internal Medicine

## 2018-07-07 ENCOUNTER — Other Ambulatory Visit: Payer: Self-pay | Admitting: Internal Medicine

## 2018-07-07 DIAGNOSIS — F418 Other specified anxiety disorders: Secondary | ICD-10-CM

## 2018-07-14 DIAGNOSIS — Z4651 Encounter for fitting and adjustment of gastric lap band: Secondary | ICD-10-CM | POA: Diagnosis not present

## 2018-07-14 DIAGNOSIS — Z9884 Bariatric surgery status: Secondary | ICD-10-CM | POA: Diagnosis not present

## 2018-07-17 DIAGNOSIS — Z1159 Encounter for screening for other viral diseases: Secondary | ICD-10-CM | POA: Diagnosis not present

## 2018-07-17 DIAGNOSIS — Z01812 Encounter for preprocedural laboratory examination: Secondary | ICD-10-CM | POA: Diagnosis not present

## 2018-07-22 ENCOUNTER — Other Ambulatory Visit: Payer: Self-pay | Admitting: Internal Medicine

## 2018-08-02 DIAGNOSIS — Z7189 Other specified counseling: Secondary | ICD-10-CM | POA: Diagnosis not present

## 2018-08-02 DIAGNOSIS — F331 Major depressive disorder, recurrent, moderate: Secondary | ICD-10-CM | POA: Diagnosis not present

## 2018-08-10 ENCOUNTER — Encounter: Payer: Self-pay | Admitting: Internal Medicine

## 2018-08-10 ENCOUNTER — Other Ambulatory Visit: Payer: Self-pay

## 2018-08-10 ENCOUNTER — Other Ambulatory Visit (INDEPENDENT_AMBULATORY_CARE_PROVIDER_SITE_OTHER): Payer: BC Managed Care – PPO

## 2018-08-10 ENCOUNTER — Ambulatory Visit (INDEPENDENT_AMBULATORY_CARE_PROVIDER_SITE_OTHER): Payer: BC Managed Care – PPO | Admitting: Internal Medicine

## 2018-08-10 VITALS — BP 138/78 | HR 91 | Temp 98.5°F | Resp 16 | Ht 63.0 in | Wt 264.0 lb

## 2018-08-10 DIAGNOSIS — R739 Hyperglycemia, unspecified: Secondary | ICD-10-CM

## 2018-08-10 DIAGNOSIS — G4700 Insomnia, unspecified: Secondary | ICD-10-CM

## 2018-08-10 DIAGNOSIS — I10 Essential (primary) hypertension: Secondary | ICD-10-CM

## 2018-08-10 DIAGNOSIS — F418 Other specified anxiety disorders: Secondary | ICD-10-CM | POA: Diagnosis not present

## 2018-08-10 DIAGNOSIS — G4730 Sleep apnea, unspecified: Secondary | ICD-10-CM

## 2018-08-10 LAB — BASIC METABOLIC PANEL
BUN: 13 mg/dL (ref 6–23)
CO2: 28 mEq/L (ref 19–32)
Calcium: 9.3 mg/dL (ref 8.4–10.5)
Chloride: 103 mEq/L (ref 96–112)
Creatinine, Ser: 0.79 mg/dL (ref 0.40–1.20)
GFR: 88.97 mL/min (ref >60.00)
Glucose, Bld: 92 mg/dL (ref 70–99)
Potassium: 3.6 mEq/L (ref 3.5–5.1)
Sodium: 138 mEq/L (ref 135–145)

## 2018-08-10 LAB — HEMOGLOBIN A1C: Hgb A1c MFr Bld: 5.9 % (ref 4.6–6.5)

## 2018-08-10 LAB — TSH: TSH: 1.72 u[IU]/mL (ref 0.35–4.50)

## 2018-08-10 MED ORDER — DULOXETINE HCL 60 MG PO CPEP: 60.0000 mg | ORAL_CAPSULE | Freq: Every day | ORAL | 1 refills | Status: DC

## 2018-08-10 MED ORDER — BELSOMRA 15 MG PO TABS: 1.0000 | ORAL_TABLET | Freq: Every evening | ORAL | 1 refills | Status: DC | PRN

## 2018-08-10 NOTE — Patient Instructions (Signed)

## 2018-08-10 NOTE — Progress Notes (Signed)
Subjective:  Patient ID: [name], female    DOB: [birth year]  Age: 63 y.o. MRN: [id number]  CC: Hypertension   HPI Lyriq Finerty presents for f/up - She complains of worsening symptoms of depression and wants to increase the dose of duloxetine.  She complains of anhedonia, anxiety, insomnia.  She has difficulty falling asleep, only gets a few hours of sleep each night, then the next day has fatigue and irritability.  Outpatient Medications Prior to Visit  Medication Sig Dispense Refill  . Crisaborole (EUCRISA) 2 % OINT Apply 1 Act topically 2 (two) times daily. 60 g 5  . CVS VITAMIN B12 2000 MCG tablet Take 2,000 mcg by mouth daily.  3  . esomeprazole (NEXIUM) 40 MG capsule TAKE 1 CAPSULE (40 MG TOTAL) BY MOUTH DAILY AT 12 NOON. 90 capsule 1  . Mometasone Furoate (ASMANEX HFA) 200 MCG/ACT AERO Inhale 1 puff into the lungs 2 (two) times daily. 13 g 11  . POTASSIUM CHLORIDE PO Take by mouth.    . potassium chloride SA (K-DUR,KLOR-CON) 20 MEQ tablet Take 1 tablet (20 mEq total) by mouth daily. 90 tablet 1  . triamterene-hydrochlorothiazide (MAXZIDE-25) 37.5-25 MG tablet TAKE 1 TABLET BY MOUTH EVERY DAY 90 tablet 0  . Vitamin D, Ergocalciferol, (DRISDOL) 50000 units CAPS capsule TAKE ONE CAPSULE BY MOUTH ONE TIME PER WEEK 12 capsule 1  . DULoxetine (CYMBALTA) 30 MG capsule TAKE 1 CAPSULE (30 MG TOTAL) DAILY BY MOUTH. 90 capsule 1  . azelastine (ASTELIN) 0.1 % nasal spray PLACE 1 SPRAY INTO BOTH NOSTRILS 2 (TWO) TIMES DAILY. USE IN EACH NOSTRIL AS DIRECTED 30 mL 1  . Besifloxacin HCl (BESIVANCE) 0.6 % SUSP Besivance 0.6 % eye drops,suspension    . Cholecalciferol (VITAMIN D3) 1.25 MG (50000 UT) CAPS TAKE 1 CAPSULE BY MOUTH ONE TIME PER WEEK 12 capsule 0  . mometasone (NASONEX) 50 MCG/ACT nasal spray Place 4 sprays into the nose daily. 51 g 3  . montelukast (SINGULAIR) 10 MG tablet TAKE 1 TABLET BY MOUTH EVERYDAY AT BEDTIME 90 tablet 1   No facility-administered medications prior to visit.      ROS Review of Systems  Constitutional: Positive for fatigue and unexpected weight change (wt gain). Negative for appetite change, diaphoresis and fever.  HENT: Negative.   Eyes: Negative.   Respiratory: Negative for cough, chest tightness, shortness of breath and wheezing.   Cardiovascular: Negative for chest pain, palpitations and leg swelling.  Gastrointestinal: Negative for abdominal pain, constipation, diarrhea, nausea and vomiting.  Endocrine: Negative.   Genitourinary: Negative.  Negative for difficulty urinating.  Musculoskeletal: Negative for arthralgias and myalgias.  Skin: Negative.  Negative for color change and pallor.  Neurological: Negative.  Negative for dizziness, weakness, light-headedness and headaches.  Hematological: Negative for adenopathy. Does not bruise/bleed easily.  Psychiatric/Behavioral: Positive for sleep disturbance. Negative for confusion, decreased concentration, dysphoric mood, self-injury and suicidal ideas. The patient is nervous/anxious.     Objective:  BP 138/78 (BP Location: Left Arm, Patient Position: Sitting, Cuff Size: Large)   Pulse 91   Temp 98.5 F (36.9 C) (Oral)   Resp 16   Ht 5\' 3"  (1.6 m)   Wt 264 lb (119.7 kg)   SpO2 96%   BMI 46.77 kg/m   BP Readings from Last 3 Encounters:  08/10/18 138/78  03/09/18 122/70  01/20/18 130/80    Wt Readings from Last 3 Encounters:  08/10/18 264 lb (119.7 kg)  03/09/18 257 lb 4 oz (116.7 kg)  01/20/18  258 lb (117 kg)    Physical Exam Vitals signs reviewed.  Constitutional:      Appearance: She is obese. She is not ill-appearing or diaphoretic.  HENT:     Nose: Nose normal.     Mouth/Throat:     Mouth: Mucous membranes are moist.  Eyes:     General: No scleral icterus.    Conjunctiva/sclera: Conjunctivae normal.  Neck:     Musculoskeletal: Normal range of motion. No neck rigidity or muscular tenderness.  Cardiovascular:     Rate and Rhythm: Normal rate.     Heart sounds: No  murmur.  Pulmonary:     Effort: Pulmonary effort is normal. No respiratory distress.     Breath sounds: No stridor. No wheezing, rhonchi or rales.  Abdominal:     General: Abdomen is protuberant. Bowel sounds are normal. There is no distension.     Palpations: Abdomen is soft. There is no hepatomegaly, splenomegaly or mass.     Tenderness: There is no abdominal tenderness.  Musculoskeletal: Normal range of motion.     Right lower leg: No edema.     Left lower leg: No edema.  Lymphadenopathy:     Cervical: No cervical adenopathy.  Skin:    General: Skin is warm and dry.     Coloration: Skin is not pale.     Findings: No erythema.  Neurological:     General: No focal deficit present.     Mental Status: She is alert.  Psychiatric:        Mood and Affect: Mood normal.        Behavior: Behavior normal.     Lab Results  Component Value Date   WBC 9.5 01/20/2018   HGB 12.3 01/20/2018   HCT 37.7 01/20/2018   PLT 329.0 01/20/2018   GLUCOSE 92 08/10/2018   CHOL 180 10/13/2017   TRIG 133.0 10/13/2017   HDL 50.90 10/13/2017   LDLCALC 103 (H) 10/13/2017   ALT 16 01/20/2018   AST 15 01/20/2018   NA 138 08/10/2018   K 3.6 08/10/2018   CL 103 08/10/2018   CREATININE 0.79 08/10/2018   BUN 13 08/10/2018   CO2 28 08/10/2018   TSH 1.72 08/10/2018   HGBA1C 5.9 08/10/2018    Ct Chest W Contrast  Result Date: 02/22/2018 CLINICAL DATA:  Follow-up lung nodules. EXAM: CT CHEST WITH CONTRAST TECHNIQUE: Multidetector CT imaging of the chest was performed during intravenous contrast administration. CONTRAST:  80mL ISOVUE-300 IOPAMIDOL (ISOVUE-300) INJECTION 61% COMPARISON:  CT scan October 19, 2017 FINDINGS: Cardiovascular: No significant vascular findings. Normal heart size. No pericardial effusion. Mediastinum/Nodes: The lap band is stable. Esophagus is unremarkable. Thyroid is normal. No adenopathy. No effusions. Lungs/Pleura: Central airways are normal. No pneumothorax. Numerous bilateral  pulmonary nodules again identified. The representative nodule seen on series 3, image 66 measures 7 mm on today's study and the previous study. The remainder of the nodules are stable as well. No new nodules, masses, or infiltrates. Atelectasis or scar is seen in the right base, similar in the interval. Upper Abdomen: No acute abnormality. Musculoskeletal: No chest wall abnormality. No acute or significant osseous findings. IMPRESSION: 1. Numerous nodules throughout both lungs, right greater than left, are stable. The largest nodule measures 7 mm on series 3, image 66. A repeat CT scan is optional in a low risk patient in 14-20 months. A CT scan is recommended in high risk patients in 14-20 months. 2. Stable lap band. Electronically Signed   By:  Gerome Samavid  Williams III M.D   On: 02/22/2018 12:59    Assessment & Plan:   [name] was seen today for hypertension.  Diagnoses and all orders for this visit:  Essential hypertension- Her blood pressure is adequately well controlled.  Electrolytes and renal function are normal. -     Basic metabolic panel; Future -     TSH; Future  Depression with anxiety -     DULoxetine (CYMBALTA) 60 MG capsule; Take 1 capsule (60 mg total) by mouth daily.  Insomnia with sleep apnea -     Suvorexant (BELSOMRA) 15 MG TABS; Take 1 tablet by mouth at bedtime as needed.  Hyperglycemia- Her A1c is at 5.9%.  She is prediabetic.  Medical therapy is not indicated. -     Hemoglobin A1c; Future   I have discontinued [name] Blancett's mometasone, azelastine, Besifloxacin HCl, montelukast, Vitamin D3, and DULoxetine. I am also having her start on Belsomra and DULoxetine. Additionally, I am having her maintain her potassium chloride SA, CVS VITAMIN B12, POTASSIUM CHLORIDE PO, Vitamin D (Ergocalciferol), Mometasone Furoate, Crisaborole, esomeprazole, and triamterene-hydrochlorothiazide.  Meds ordered this encounter  Medications  . Suvorexant (BELSOMRA) 15 MG TABS    Sig: Take 1  tablet by mouth at bedtime as needed.    Dispense:  90 tablet    Refill:  1  . DULoxetine (CYMBALTA) 60 MG capsule    Sig: Take 1 capsule (60 mg total) by mouth daily.    Dispense:  90 capsule    Refill:  1     Follow-up: Return in about 6 months (around 02/10/2019).  Sanda Lingerhomas Yui Mulvaney, MD

## 2018-08-16 DIAGNOSIS — Z6841 Body Mass Index (BMI) 40.0 and over, adult: Secondary | ICD-10-CM | POA: Diagnosis not present

## 2018-08-16 DIAGNOSIS — Z9884 Bariatric surgery status: Secondary | ICD-10-CM | POA: Diagnosis not present

## 2018-08-21 DIAGNOSIS — Z20828 Contact with and (suspected) exposure to other viral communicable diseases: Secondary | ICD-10-CM | POA: Diagnosis not present

## 2018-08-21 DIAGNOSIS — Z6841 Body Mass Index (BMI) 40.0 and over, adult: Secondary | ICD-10-CM | POA: Diagnosis not present

## 2018-08-21 DIAGNOSIS — Z9884 Bariatric surgery status: Secondary | ICD-10-CM | POA: Diagnosis not present

## 2018-08-21 DIAGNOSIS — Z01812 Encounter for preprocedural laboratory examination: Secondary | ICD-10-CM | POA: Diagnosis not present

## 2018-08-25 DIAGNOSIS — I10 Essential (primary) hypertension: Secondary | ICD-10-CM | POA: Diagnosis not present

## 2018-08-25 DIAGNOSIS — R918 Other nonspecific abnormal finding of lung field: Secondary | ICD-10-CM | POA: Diagnosis not present

## 2018-08-25 DIAGNOSIS — Z9884 Bariatric surgery status: Secondary | ICD-10-CM | POA: Diagnosis not present

## 2018-08-25 DIAGNOSIS — Z882 Allergy status to sulfonamides status: Secondary | ICD-10-CM | POA: Diagnosis not present

## 2018-08-25 DIAGNOSIS — Z6841 Body Mass Index (BMI) 40.0 and over, adult: Secondary | ICD-10-CM | POA: Diagnosis not present

## 2018-08-25 DIAGNOSIS — G4733 Obstructive sleep apnea (adult) (pediatric): Secondary | ICD-10-CM | POA: Diagnosis not present

## 2018-08-25 DIAGNOSIS — Z01818 Encounter for other preprocedural examination: Secondary | ICD-10-CM | POA: Diagnosis not present

## 2018-08-25 DIAGNOSIS — Z79899 Other long term (current) drug therapy: Secondary | ICD-10-CM | POA: Diagnosis not present

## 2018-08-25 DIAGNOSIS — K2970 Gastritis, unspecified, without bleeding: Secondary | ICD-10-CM | POA: Diagnosis not present

## 2018-08-25 DIAGNOSIS — Z888 Allergy status to other drugs, medicaments and biological substances status: Secondary | ICD-10-CM | POA: Diagnosis not present

## 2018-08-25 DIAGNOSIS — K219 Gastro-esophageal reflux disease without esophagitis: Secondary | ICD-10-CM | POA: Diagnosis not present

## 2018-08-25 DIAGNOSIS — J4520 Mild intermittent asthma, uncomplicated: Secondary | ICD-10-CM | POA: Diagnosis not present

## 2018-09-01 ENCOUNTER — Other Ambulatory Visit: Payer: Self-pay | Admitting: Internal Medicine

## 2018-09-01 DIAGNOSIS — E559 Vitamin D deficiency, unspecified: Secondary | ICD-10-CM

## 2018-09-14 DIAGNOSIS — Z7189 Other specified counseling: Secondary | ICD-10-CM | POA: Diagnosis not present

## 2018-09-27 DIAGNOSIS — Z713 Dietary counseling and surveillance: Secondary | ICD-10-CM | POA: Diagnosis not present

## 2018-09-27 DIAGNOSIS — Z9884 Bariatric surgery status: Secondary | ICD-10-CM | POA: Diagnosis not present

## 2018-10-06 DIAGNOSIS — G4733 Obstructive sleep apnea (adult) (pediatric): Secondary | ICD-10-CM | POA: Diagnosis not present

## 2018-10-19 DIAGNOSIS — K219 Gastro-esophageal reflux disease without esophagitis: Secondary | ICD-10-CM | POA: Diagnosis not present

## 2018-10-19 DIAGNOSIS — Z9884 Bariatric surgery status: Secondary | ICD-10-CM | POA: Diagnosis not present

## 2018-10-19 DIAGNOSIS — G4733 Obstructive sleep apnea (adult) (pediatric): Secondary | ICD-10-CM | POA: Diagnosis not present

## 2018-10-26 ENCOUNTER — Other Ambulatory Visit: Payer: Self-pay | Admitting: Internal Medicine

## 2018-10-26 DIAGNOSIS — F418 Other specified anxiety disorders: Secondary | ICD-10-CM

## 2018-11-01 DIAGNOSIS — Z713 Dietary counseling and surveillance: Secondary | ICD-10-CM | POA: Diagnosis not present

## 2018-11-01 DIAGNOSIS — Z9884 Bariatric surgery status: Secondary | ICD-10-CM | POA: Diagnosis not present

## 2018-11-05 ENCOUNTER — Other Ambulatory Visit: Payer: Self-pay | Admitting: Internal Medicine

## 2018-11-05 DIAGNOSIS — K2100 Gastro-esophageal reflux disease with esophagitis, without bleeding: Secondary | ICD-10-CM

## 2018-11-06 ENCOUNTER — Other Ambulatory Visit: Payer: Self-pay | Admitting: Internal Medicine

## 2018-11-10 LAB — HM MAMMOGRAPHY: HM Mammogram: NORMAL (ref 0–4)

## 2018-11-10 LAB — HM PAP SMEAR

## 2018-11-21 ENCOUNTER — Other Ambulatory Visit: Payer: Self-pay | Admitting: Internal Medicine

## 2018-11-21 DIAGNOSIS — E559 Vitamin D deficiency, unspecified: Secondary | ICD-10-CM

## 2018-11-22 ENCOUNTER — Telehealth: Payer: Self-pay | Admitting: Internal Medicine

## 2018-11-22 NOTE — Telephone Encounter (Signed)
Patient dropped off a Bariatric Surgery Support Letter to be completed by Dr Ronnald Ramp.  She would like this faxed to [facility] at [phone]  Attn: Madge/Stephanie/Robin  Placed in Dr Ronnald Ramp' box for completion.

## 2018-11-26 NOTE — Telephone Encounter (Signed)
I do not have these forms  

## 2018-11-26 NOTE — Telephone Encounter (Signed)
On my desk  TJ

## 2018-11-26 NOTE — Telephone Encounter (Signed)
I have not seen the forms yet. Do either of you have it?

## 2018-12-13 DIAGNOSIS — E669 Obesity, unspecified: Secondary | ICD-10-CM | POA: Diagnosis not present

## 2018-12-13 DIAGNOSIS — Z130 Encounter for screening for diseases of the blood and blood-forming organs and certain disorders involving the immune mechanism: Secondary | ICD-10-CM | POA: Diagnosis not present

## 2018-12-13 DIAGNOSIS — Z1389 Encounter for screening for other disorder: Secondary | ICD-10-CM | POA: Diagnosis not present

## 2018-12-13 DIAGNOSIS — Z01419 Encounter for gynecological examination (general) (routine) without abnormal findings: Secondary | ICD-10-CM | POA: Diagnosis not present

## 2018-12-13 DIAGNOSIS — Z1231 Encounter for screening mammogram for malignant neoplasm of breast: Secondary | ICD-10-CM | POA: Diagnosis not present

## 2019-01-26 ENCOUNTER — Other Ambulatory Visit: Payer: Self-pay | Admitting: Internal Medicine

## 2019-02-02 ENCOUNTER — Other Ambulatory Visit: Payer: Self-pay | Admitting: Internal Medicine

## 2019-02-02 DIAGNOSIS — F418 Other specified anxiety disorders: Secondary | ICD-10-CM

## 2019-02-10 ENCOUNTER — Other Ambulatory Visit: Payer: Self-pay

## 2019-02-10 ENCOUNTER — Ambulatory Visit (INDEPENDENT_AMBULATORY_CARE_PROVIDER_SITE_OTHER): Payer: BC Managed Care – PPO | Admitting: Internal Medicine

## 2019-02-10 ENCOUNTER — Encounter: Payer: Self-pay | Admitting: Internal Medicine

## 2019-02-10 VITALS — BP 144/80 | HR 87 | Temp 98.8°F | Ht 63.0 in | Wt 257.0 lb

## 2019-02-10 DIAGNOSIS — Z0001 Encounter for general adult medical examination with abnormal findings: Secondary | ICD-10-CM | POA: Diagnosis not present

## 2019-02-10 DIAGNOSIS — I10 Essential (primary) hypertension: Secondary | ICD-10-CM

## 2019-02-10 DIAGNOSIS — D510 Vitamin B12 deficiency anemia due to intrinsic factor deficiency: Secondary | ICD-10-CM

## 2019-02-10 DIAGNOSIS — Z9884 Bariatric surgery status: Secondary | ICD-10-CM

## 2019-02-10 DIAGNOSIS — R739 Hyperglycemia, unspecified: Secondary | ICD-10-CM

## 2019-02-10 DIAGNOSIS — D509 Iron deficiency anemia, unspecified: Secondary | ICD-10-CM | POA: Diagnosis not present

## 2019-02-10 DIAGNOSIS — Z0000 Encounter for general adult medical examination without abnormal findings: Secondary | ICD-10-CM

## 2019-02-10 DIAGNOSIS — E60 Dietary zinc deficiency: Secondary | ICD-10-CM

## 2019-02-10 DIAGNOSIS — E559 Vitamin D deficiency, unspecified: Secondary | ICD-10-CM | POA: Diagnosis not present

## 2019-02-10 DIAGNOSIS — E785 Hyperlipidemia, unspecified: Secondary | ICD-10-CM

## 2019-02-10 DIAGNOSIS — Z23 Encounter for immunization: Secondary | ICD-10-CM

## 2019-02-10 LAB — FOLATE: Folate: 12.4 ng/mL (ref >5.9)

## 2019-02-10 LAB — HEPATIC FUNCTION PANEL
ALT: 20 U/L (ref 0–35)
AST: 17 U/L (ref 0–37)
Albumin: 3.9 g/dL (ref 3.5–5.2)
Alkaline Phosphatase: 112 U/L (ref 39–117)
Bilirubin, Direct: 0.1 mg/dL (ref 0.0–0.3)
Total Bilirubin: 0.3 mg/dL (ref 0.2–1.2)
Total Protein: 7.4 g/dL (ref 6.0–8.3)

## 2019-02-10 LAB — BASIC METABOLIC PANEL
BUN: 11 mg/dL (ref 6–23)
CO2: 32 mEq/L (ref 19–32)
Calcium: 9.2 mg/dL (ref 8.4–10.5)
Chloride: 102 mEq/L (ref 96–112)
Creatinine, Ser: 0.84 mg/dL (ref 0.40–1.20)
GFR: 82.75 mL/min (ref >60.00)
Glucose, Bld: 93 mg/dL (ref 70–99)
Potassium: 3.8 mEq/L (ref 3.5–5.1)
Sodium: 140 mEq/L (ref 135–145)

## 2019-02-10 LAB — LIPID PANEL
Cholesterol: 180 mg/dL (ref 0–200)
HDL: 49.9 mg/dL (ref >39.00)
LDL Cholesterol: 111 mg/dL — ABNORMAL HIGH (ref 0–99)
NonHDL: 129.99
Total CHOL/HDL Ratio: 4
Triglycerides: 95 mg/dL (ref 0.0–149.0)
VLDL: 19 mg/dL (ref 0.0–40.0)

## 2019-02-10 LAB — HEMOGLOBIN A1C: Hgb A1c MFr Bld: 6 % (ref 4.6–6.5)

## 2019-02-10 LAB — IBC PANEL
Iron: 33 ug/dL — ABNORMAL LOW (ref 42–145)
Saturation Ratios: 8 % — ABNORMAL LOW (ref 20.0–50.0)
Transferrin: 293 mg/dL (ref 212.0–360.0)

## 2019-02-10 LAB — CBC WITH DIFFERENTIAL/PLATELET
Basophils Absolute: 0.1 10*3/uL (ref 0.0–0.1)
Basophils Relative: 0.8 % (ref 0.0–3.0)
Eosinophils Absolute: 0.2 10*3/uL (ref 0.0–0.7)
Eosinophils Relative: 2.1 % (ref 0.0–5.0)
HCT: 33.8 % — ABNORMAL LOW (ref 36.0–46.0)
Hemoglobin: 10.6 g/dL — ABNORMAL LOW (ref 12.0–15.0)
Lymphocytes Relative: 23.7 % (ref 12.0–46.0)
Lymphs Abs: 1.9 10*3/uL (ref 0.7–4.0)
MCHC: 31.3 g/dL (ref 30.0–36.0)
MCV: 83 fl (ref 78.0–100.0)
Monocytes Absolute: 0.4 10*3/uL (ref 0.1–1.0)
Monocytes Relative: 5.4 % (ref 3.0–12.0)
Neutro Abs: 5.4 10*3/uL (ref 1.4–7.7)
Neutrophils Relative %: 68 % (ref 43.0–77.0)
Platelets: 314 10*3/uL (ref 150.0–400.0)
RBC: 4.07 Mil/uL (ref 3.87–5.11)
RDW: 16.1 % — ABNORMAL HIGH (ref 11.5–15.5)
WBC: 7.9 10*3/uL (ref 4.0–10.5)

## 2019-02-10 LAB — VITAMIN B12: Vitamin B-12: 478 pg/mL (ref 211–911)

## 2019-02-10 LAB — TSH: TSH: 1.57 u[IU]/mL (ref 0.35–4.50)

## 2019-02-10 LAB — VITAMIN D 25 HYDROXY (VIT D DEFICIENCY, FRACTURES): VITD: 32.87 ng/mL (ref 30.00–100.00)

## 2019-02-10 NOTE — Patient Instructions (Signed)
Anemia  Anemia is a condition in which you do not have enough red blood cells or hemoglobin. Hemoglobin is a substance in red blood cells that carries oxygen. When you do not have enough red blood cells or hemoglobin (are anemic), your body cannot get enough oxygen and your organs may not work properly. As a result, you may feel very tired or have other problems. What are the causes? Common causes of anemia include:  Excessive bleeding. Anemia can be caused by excessive bleeding inside or outside the body, including bleeding from the intestine or from periods in women.  Poor nutrition.  Long-lasting (chronic) kidney, thyroid, and liver disease.  Bone marrow disorders.  Cancer and treatments for cancer.  HIV (human immunodeficiency virus) and AIDS (acquired immunodeficiency syndrome).  Treatments for HIV and AIDS.  Spleen problems.  Blood disorders.  Infections, medicines, and autoimmune disorders that destroy red blood cells. What are the signs or symptoms? Symptoms of this condition include:  Minor weakness.  Dizziness.  Headache.  Feeling heartbeats that are irregular or faster than normal (palpitations).  Shortness of breath, especially with exercise.  Paleness.  Cold sensitivity.  Indigestion.  Nausea.  Difficulty sleeping.  Difficulty concentrating. Symptoms may occur suddenly or develop slowly. If your anemia is mild, you may not have symptoms. How is this diagnosed? This condition is diagnosed based on:  Blood tests.  Your medical history.  A physical exam.  Bone marrow biopsy. Your health care provider may also check your stool (feces) for blood and may do additional testing to look for the cause of your bleeding. You may also have other tests, including:  Imaging tests, such as a CT scan or MRI.  Endoscopy.  Colonoscopy. How is this treated? Treatment for this condition depends on the cause. If you continue to lose a lot of blood, you may  need to be treated at a hospital. Treatment may include:  Taking supplements of iron, vitamin S31, or folic acid.  Taking a hormone medicine (erythropoietin) that can help to stimulate red blood cell growth.  Having a blood transfusion. This may be needed if you lose a lot of blood.  Making changes to your diet.  Having surgery to remove your spleen. Follow these instructions at home:  Take over-the-counter and prescription medicines only as told by your health care provider.  Take supplements only as told by your health care provider.  Follow any diet instructions that you were given.  Keep all follow-up visits as told by your health care provider. This is important. Contact a health care provider if:  You develop new bleeding anywhere in the body. Get help right away if:  You are very weak.  You are short of breath.  You have pain in your abdomen or chest.  You are dizzy or feel faint.  You have trouble concentrating.  You have bloody or black, tarry stools.  You vomit repeatedly or you vomit up blood. Summary  Anemia is a condition in which you do not have enough red blood cells or enough of a substance in your red blood cells that carries oxygen (hemoglobin).  Symptoms may occur suddenly or develop slowly.  If your anemia is mild, you may not have symptoms.  This condition is diagnosed with blood tests as well as a medical history and physical exam. Other tests may be needed.  Treatment for this condition depends on the cause of the anemia. This information is not intended to replace advice given to you by  your health care provider. Make sure you discuss any questions you have with your health care provider. Document Revised: 12/05/2016 Document Reviewed: 01/25/2016 Elsevier Patient Education  [employer].

## 2019-02-10 NOTE — Progress Notes (Signed)
Subjective:  Patient ID: [name], female    DOB: 01/16/1955  Age: 64 y.o. MRN: [id number]  CC: Annual Exam, Hypertension, and Anemia  This visit occurred during the SARS-CoV-2 public health emergency.  Safety protocols were in place, including screening questions prior to the visit, additional usage of staff PPE, and extensive cleaning of exam room while observing appropriate contact time as indicated for disinfecting solutions.    HPI [name] presents for a CPX.  She saw her gynecologist about 2 months ago and was told that she has iron deficiency anemia.  She complains of pica with ice chips.  She is taking an oral iron supplement.  She complains of fatigue but denies shortness of breath, abdominal pain, bright red blood per rectum, melena, or paresthesias.  She has intentionally lost weight with lifestyle modifications.  Outpatient Medications Prior to Visit  Medication Sig Dispense Refill  . Crisaborole (EUCRISA) 2 % OINT Apply 1 Act topically 2 (two) times daily. 60 g 5  . esomeprazole (NEXIUM) 40 MG capsule TAKE 1 CAPSULE (40 MG TOTAL) BY MOUTH DAILY AT 12 NOON. 90 capsule 1  . POTASSIUM CHLORIDE PO Take by mouth.    . triamterene-hydrochlorothiazide (MAXZIDE-25) 37.5-25 MG tablet TAKE 1 TABLET BY MOUTH EVERY DAY 90 tablet 0  . Vitamin D, Ergocalciferol, (DRISDOL) 1.25 MG (50000 UT) CAPS capsule TAKE 1 CAPSULE BY MOUTH ONCE A WEEK 12 capsule 0  . Aspirin-Salicylamide-Caffeine (BC HEADACHE POWDER PO) Take by mouth.    . diphenhydrAMINE (SOMINEX) 25 MG tablet Take 50 mg by mouth at bedtime as needed for sleep.    . CVS VITAMIN B12 2000 MCG tablet Take 2,000 mcg by mouth daily.  3  . DULoxetine (CYMBALTA) 30 MG capsule TAKE 1 CAPSULE (30 MG TOTAL) DAILY BY MOUTH. 90 capsule 1  . DULoxetine (CYMBALTA) 60 MG capsule TAKE 1 CAPSULE BY MOUTH EVERY DAY 90 capsule 1  . Mometasone Furoate (ASMANEX HFA) 200 MCG/ACT AERO Inhale 1 puff into the lungs 2 (two) times daily. 13 g 11  .  potassium chloride SA (K-DUR,KLOR-CON) 20 MEQ tablet Take 1 tablet (20 mEq total) by mouth daily. 90 tablet 1  . Suvorexant (BELSOMRA) 15 MG TABS Take 1 tablet by mouth at bedtime as needed. 90 tablet 1   No facility-administered medications prior to visit.    ROS Review of Systems  Constitutional: Negative for appetite change, chills, diaphoresis, fatigue, fever and unexpected weight change.  HENT: Negative.  Negative for trouble swallowing.   Eyes: Negative.   Respiratory: Negative for cough, chest tightness, shortness of breath and wheezing.   Cardiovascular: Negative for chest pain, palpitations and leg swelling.  Gastrointestinal: Negative for abdominal pain, blood in stool, constipation, diarrhea, nausea and vomiting.  Endocrine: Negative.   Genitourinary: Negative.   Musculoskeletal: Negative for arthralgias, joint swelling and myalgias.  Skin: Negative.  Negative for color change and pallor.  Neurological: Negative.  Negative for dizziness, weakness, light-headedness, numbness and headaches.  Hematological: Negative for adenopathy. Does not bruise/bleed easily.  Psychiatric/Behavioral: Negative.     Objective:  BP (!) 144/80 (BP Location: Left Arm, Patient Position: Sitting, Cuff Size: Large)   Pulse 87   Temp 98.8 F (37.1 C) (Oral)   Ht 5\' 3"  (1.6 m)   Wt 257 lb (116.6 kg)   SpO2 95%   BMI 45.53 kg/m   BP Readings from Last 3 Encounters:  02/10/19 (!) 144/80  08/10/18 138/78  03/09/18 122/70    Wt Readings from Last  3 Encounters:  02/10/19 257 lb (116.6 kg)  08/10/18 264 lb (119.7 kg)  03/09/18 257 lb 4 oz (116.7 kg)    Physical Exam Vitals reviewed.  Constitutional:      Appearance: She is obese.  HENT:     Nose: Nose normal.     Mouth/Throat:     Mouth: Mucous membranes are moist. Mucous membranes are pale.  Eyes:     General: No scleral icterus.    Conjunctiva/sclera: Conjunctivae normal.  Cardiovascular:     Rate and Rhythm: Normal rate and  regular rhythm.     Heart sounds: No murmur.  Pulmonary:     Effort: Pulmonary effort is normal.     Breath sounds: No stridor. No wheezing, rhonchi or rales.  Abdominal:     General: Abdomen is protuberant. Bowel sounds are normal. There is no distension.     Palpations: Abdomen is soft. There is no hepatomegaly, splenomegaly or mass.     Tenderness: There is no abdominal tenderness. There is no guarding.  Musculoskeletal:        General: Normal range of motion.     Cervical back: Neck supple.     Right lower leg: No edema.     Left lower leg: No edema.  Lymphadenopathy:     Cervical: No cervical adenopathy.  Skin:    General: Skin is warm and dry.     Coloration: Skin is pale.  Neurological:     General: No focal deficit present.     Mental Status: She is alert.  Psychiatric:        Mood and Affect: Mood normal.        Behavior: Behavior normal.     Lab Results  Component Value Date   WBC 7.9 02/10/2019   HGB 10.6 (L) 02/10/2019   HCT 33.8 (L) 02/10/2019   PLT 314.0 02/10/2019   GLUCOSE 93 02/10/2019   CHOL 180 02/10/2019   TRIG 95.0 02/10/2019   HDL 49.90 02/10/2019   LDLCALC 111 (H) 02/10/2019   ALT 20 02/10/2019   AST 17 02/10/2019   NA 140 02/10/2019   K 3.8 02/10/2019   CL 102 02/10/2019   CREATININE 0.84 02/10/2019   BUN 11 02/10/2019   CO2 32 02/10/2019   TSH 1.57 02/10/2019   HGBA1C 6.0 02/10/2019    CT Chest W Contrast  Result Date: 02/22/2018 CLINICAL DATA:  Follow-up lung nodules. EXAM: CT CHEST WITH CONTRAST TECHNIQUE: Multidetector CT imaging of the chest was performed during intravenous contrast administration. CONTRAST:  89mL ISOVUE-300 IOPAMIDOL (ISOVUE-300) INJECTION 61% COMPARISON:  CT scan October 19, 2017 FINDINGS: Cardiovascular: No significant vascular findings. Normal heart size. No pericardial effusion. Mediastinum/Nodes: The lap band is stable. Esophagus is unremarkable. Thyroid is normal. No adenopathy. No effusions. Lungs/Pleura:  Central airways are normal. No pneumothorax. Numerous bilateral pulmonary nodules again identified. The representative nodule seen on series 3, image 66 measures 7 mm on today's study and the previous study. The remainder of the nodules are stable as well. No new nodules, masses, or infiltrates. Atelectasis or scar is seen in the right base, similar in the interval. Upper Abdomen: No acute abnormality. Musculoskeletal: No chest wall abnormality. No acute or significant osseous findings. IMPRESSION: 1. Numerous nodules throughout both lungs, right greater than left, are stable. The largest nodule measures 7 mm on series 3, image 66. A repeat CT scan is optional in a low risk patient in 14-20 months. A CT scan is recommended in high risk patients  in 14-20 months. 2. Stable lap band. Electronically Signed   By: Dorise Bullion III M.D   On: 02/22/2018 12:59    Assessment & Plan:   Tandi was seen today for annual exam, hypertension and anemia.  Diagnoses and all orders for this visit:  Essential hypertension- Her blood pressure is not quite adequately well controlled.  She will continue her current antihypertensives and working on her lifestyle modifications. -     TSH -     Hepatic function panel -     Basic metabolic panel  Vitamin D deficiency- Her Vit D level is normal now. -     VITAMIN D 25 Hydroxy (Vit-D Deficiency, Fractures)  Hyperglycemia- She is prediabetic with an A1c of 6.0%.  Medical therapy is not indicated. -     Hemoglobin A1c -     Basic metabolic panel  Routine general medical examination at a health care facility- Exam completed, labs reviewed, vaccines reviewed, screening for cervical cancer/breast cancer/colon cancer are all up-to-date, patient education was given. -     Lipid panel -     HIV Antibody (routine testing w rflx)  Vitamin B12 deficiency anemia due to intrinsic factor deficiency- She will continue parenteral B12 replacement therapy. -     CBC with  Differential/Platelet -     Folate  LAP-BAND surgery status- I will monitor her for vitamin deficiencies. -     Vitamin B12 -     Vitamin B1 -     Zinc; Future -     Zinc -     zinc gluconate 50 MG tablet; Take 1 tablet (50 mg total) by mouth every other day.  Iron deficiency anemia, unspecified iron deficiency anemia type - Her H&H are moderately low and her iron level is low.  She complains of pica.  She will receive a series of iron infusions. -     CBC with Differential/Platelet -     IBC panel -     Hepatic function panel  Need for Tdap vaccination -     Tdap vaccine greater than or equal to 7yo IM  Hyperlipidemia with target LDL less than 130- She does not have an elevated ASCVD risk score so I did not recommend a statin for CV risk reduction.  Zinc deficiency -     zinc gluconate 50 MG tablet; Take 1 tablet (50 mg total) by mouth every other day.   I have discontinued Thayer Headings Rutan's potassium chloride SA, CVS VITAMIN B12, Mometasone Furoate, Belsomra, DULoxetine, DULoxetine, diphenhydrAMINE, and Aspirin-Salicylamide-Caffeine (BC HEADACHE POWDER PO). I am also having her start on zinc gluconate. Additionally, I am having her maintain her POTASSIUM CHLORIDE PO, Crisaborole, esomeprazole, Vitamin D (Ergocalciferol), and triamterene-hydrochlorothiazide.  Meds ordered this encounter  Medications  . zinc gluconate 50 MG tablet    Sig: Take 1 tablet (50 mg total) by mouth every other day.    Dispense:  45 tablet    Refill:  1     Follow-up: Return in about 3 months (around 05/10/2019).  Scarlette Calico, MD

## 2019-02-11 ENCOUNTER — Encounter: Payer: Self-pay | Admitting: Internal Medicine

## 2019-02-12 ENCOUNTER — Encounter: Payer: Self-pay | Admitting: Internal Medicine

## 2019-02-12 DIAGNOSIS — E60 Dietary zinc deficiency: Secondary | ICD-10-CM | POA: Insufficient documentation

## 2019-02-12 LAB — ZINC: Zinc: 67 ug/dL (ref 60–130)

## 2019-02-12 MED ORDER — ZINC GLUCONATE 50 MG PO TABS: 50.0000 mg | ORAL_TABLET | ORAL | 1 refills | Status: DC

## 2019-02-13 LAB — HIV ANTIBODY (ROUTINE TESTING W REFLEX): HIV 1&2 Ab, 4th Generation: NONREACTIVE

## 2019-02-13 LAB — VITAMIN B1: Vitamin B1 (Thiamine): 6 nmol/L — ABNORMAL LOW (ref 8–30)

## 2019-02-14 ENCOUNTER — Encounter: Payer: Self-pay | Admitting: Internal Medicine

## 2019-02-14 ENCOUNTER — Other Ambulatory Visit: Payer: Self-pay | Admitting: Internal Medicine

## 2019-02-14 DIAGNOSIS — E519 Thiamine deficiency, unspecified: Secondary | ICD-10-CM

## 2019-02-14 DIAGNOSIS — Z9884 Bariatric surgery status: Secondary | ICD-10-CM

## 2019-02-14 MED ORDER — THIAMINE HCL 100 MG PO TABS: 100.0000 mg | ORAL_TABLET | Freq: Every day | ORAL | 1 refills | Status: DC

## 2019-02-16 ENCOUNTER — Other Ambulatory Visit: Payer: Self-pay | Admitting: Internal Medicine

## 2019-02-16 ENCOUNTER — Other Ambulatory Visit: Payer: Self-pay

## 2019-02-16 ENCOUNTER — Ambulatory Visit (HOSPITAL_COMMUNITY)
Admission: RE | Admit: 2019-02-16 | Discharge: 2019-02-16 | Disposition: A | Discharge status: Home / Self Care | Payer: BC Managed Care – PPO | Source: Ambulatory Visit | Attending: Internal Medicine | Admitting: Internal Medicine

## 2019-02-16 DIAGNOSIS — Z9884 Bariatric surgery status: Secondary | ICD-10-CM

## 2019-02-16 DIAGNOSIS — E559 Vitamin D deficiency, unspecified: Secondary | ICD-10-CM

## 2019-02-16 DIAGNOSIS — D509 Iron deficiency anemia, unspecified: Secondary | ICD-10-CM | POA: Diagnosis not present

## 2019-02-16 MED ORDER — SODIUM CHLORIDE 0.9 % IV SOLN
INTRAVENOUS | Status: DC | PRN
  Administered 2019-02-16: 250 mL via INTRAVENOUS

## 2019-02-16 MED ORDER — SODIUM CHLORIDE 0.9 % IV SOLN
750.0000 mg | Freq: Once | INTRAVENOUS | Status: AC
  Administered 2019-02-16: 750 mg via INTRAVENOUS
  Filled 2019-02-16: qty 15

## 2019-02-16 MED ORDER — VITAMIN D (ERGOCALCIFEROL) 1.25 MG (50000 UNIT) PO CAPS: 50000.0000 [IU] | ORAL_CAPSULE | ORAL | 0 refills | Status: DC

## 2019-02-16 NOTE — Progress Notes (Signed)
PATIENT CARE CENTER NOTE  Provider:  [name] MD   Procedure: IV Injectafer   Note: Patient received IV  Injectafer. Observed for at least 30 minutes post infusion.Tolerated well, vital stable,  discharge instructions given, verbalized understanding. Patient alert, oriented and ambulatory at the time of discharge.

## 2019-02-16 NOTE — Discharge Instructions (Signed)

## 2019-03-18 DIAGNOSIS — Z9884 Bariatric surgery status: Secondary | ICD-10-CM | POA: Diagnosis not present

## 2019-03-18 DIAGNOSIS — I10 Essential (primary) hypertension: Secondary | ICD-10-CM | POA: Diagnosis not present

## 2019-03-18 DIAGNOSIS — Z01818 Encounter for other preprocedural examination: Secondary | ICD-10-CM | POA: Diagnosis not present

## 2019-03-18 DIAGNOSIS — J4520 Mild intermittent asthma, uncomplicated: Secondary | ICD-10-CM | POA: Diagnosis not present

## 2019-03-18 DIAGNOSIS — K219 Gastro-esophageal reflux disease without esophagitis: Secondary | ICD-10-CM | POA: Diagnosis not present

## 2019-03-25 DIAGNOSIS — Z01818 Encounter for other preprocedural examination: Secondary | ICD-10-CM | POA: Diagnosis not present

## 2019-03-28 DIAGNOSIS — Z6841 Body Mass Index (BMI) 40.0 and over, adult: Secondary | ICD-10-CM | POA: Diagnosis not present

## 2019-03-28 DIAGNOSIS — Z881 Allergy status to other antibiotic agents status: Secondary | ICD-10-CM | POA: Diagnosis not present

## 2019-03-28 DIAGNOSIS — Z79899 Other long term (current) drug therapy: Secondary | ICD-10-CM | POA: Diagnosis not present

## 2019-03-28 DIAGNOSIS — E8881 Metabolic syndrome: Secondary | ICD-10-CM | POA: Diagnosis not present

## 2019-03-28 DIAGNOSIS — R918 Other nonspecific abnormal finding of lung field: Secondary | ICD-10-CM | POA: Diagnosis not present

## 2019-03-28 DIAGNOSIS — R5383 Other fatigue: Secondary | ICD-10-CM | POA: Diagnosis not present

## 2019-03-28 DIAGNOSIS — J4520 Mild intermittent asthma, uncomplicated: Secondary | ICD-10-CM | POA: Diagnosis not present

## 2019-03-28 DIAGNOSIS — Z9884 Bariatric surgery status: Secondary | ICD-10-CM | POA: Diagnosis not present

## 2019-03-28 DIAGNOSIS — K219 Gastro-esophageal reflux disease without esophagitis: Secondary | ICD-10-CM | POA: Diagnosis not present

## 2019-03-28 DIAGNOSIS — I10 Essential (primary) hypertension: Secondary | ICD-10-CM | POA: Diagnosis not present

## 2019-03-28 DIAGNOSIS — M2550 Pain in unspecified joint: Secondary | ICD-10-CM | POA: Diagnosis not present

## 2019-03-28 DIAGNOSIS — G4733 Obstructive sleep apnea (adult) (pediatric): Secondary | ICD-10-CM | POA: Diagnosis not present

## 2019-03-28 DIAGNOSIS — M549 Dorsalgia, unspecified: Secondary | ICD-10-CM | POA: Diagnosis not present

## 2019-03-28 DIAGNOSIS — K660 Peritoneal adhesions (postprocedural) (postinfection): Secondary | ICD-10-CM | POA: Diagnosis not present

## 2019-03-28 DIAGNOSIS — Z888 Allergy status to other drugs, medicaments and biological substances status: Secondary | ICD-10-CM | POA: Diagnosis not present

## 2019-03-29 MED ORDER — SCOPOLAMINE 1 MG/3DAYS TD PT72
1.00 | MEDICATED_PATCH | TRANSDERMAL | Status: DC
Start: 2019-03-31 — End: 2019-03-29

## 2019-03-29 MED ORDER — SODIUM CHLORIDE 0.9 % IV SOLN
75.00 | INTRAVENOUS | Status: DC
Start: ? — End: 2019-03-29

## 2019-03-29 MED ORDER — ONDANSETRON HCL 4 MG/2ML IJ SOLN
4.00 | INTRAMUSCULAR | Status: DC
Start: ? — End: 2019-03-29

## 2019-03-29 MED ORDER — LACTATED RINGERS IV SOLN
25.00 | INTRAVENOUS | Status: DC
Start: ? — End: 2019-03-29

## 2019-03-29 MED ORDER — ALBUTEROL SULFATE (2.5 MG/3ML) 0.083% IN NEBU
2.50 | INHALATION_SOLUTION | RESPIRATORY_TRACT | Status: DC
Start: ? — End: 2019-03-29

## 2019-03-29 MED ORDER — FENTANYL CITRATE (PF) 2500 MCG/50ML IJ SOLN
50.00 | INTRAMUSCULAR | Status: DC
Start: ? — End: 2019-03-29

## 2019-03-29 MED ORDER — SODIUM CHLORIDE 0.9 % IV SOLN
25.00 | INTRAVENOUS | Status: DC
Start: ? — End: 2019-03-29

## 2019-03-29 MED ORDER — BUPIVACAINE-EPINEPHRINE (PF) 0.5% -1:200000 IJ SOLN
INTRAMUSCULAR | Status: DC
Start: ? — End: 2019-03-29

## 2019-03-29 MED ORDER — MEPERIDINE HCL 25 MG/ML IJ SOLN
25.00 | INTRAMUSCULAR | Status: DC
Start: ? — End: 2019-03-29

## 2019-03-29 MED ORDER — MIDAZOLAM HCL 2 MG/2ML IJ SOLN
1.00 | INTRAMUSCULAR | Status: DC
Start: ? — End: 2019-03-29

## 2019-03-29 MED ORDER — LABETALOL HCL 5 MG/ML IV SOLN
5.00 | INTRAVENOUS | Status: DC
Start: ? — End: 2019-03-29

## 2019-05-01 ENCOUNTER — Other Ambulatory Visit: Payer: Self-pay | Admitting: Internal Medicine

## 2019-05-06 DIAGNOSIS — Z9884 Bariatric surgery status: Secondary | ICD-10-CM | POA: Diagnosis not present

## 2019-05-06 DIAGNOSIS — Z09 Encounter for follow-up examination after completed treatment for conditions other than malignant neoplasm: Secondary | ICD-10-CM | POA: Diagnosis not present

## 2019-05-24 ENCOUNTER — Other Ambulatory Visit: Payer: Self-pay | Admitting: Internal Medicine

## 2019-05-24 DIAGNOSIS — E559 Vitamin D deficiency, unspecified: Secondary | ICD-10-CM

## 2019-05-25 MED ORDER — VITAMIN D (ERGOCALCIFEROL) 1.25 MG (50000 UNIT) PO CAPS: 50000.0000 [IU] | ORAL_CAPSULE | ORAL | 0 refills | Status: DC

## 2019-07-13 ENCOUNTER — Other Ambulatory Visit: Payer: Self-pay

## 2019-07-13 ENCOUNTER — Encounter (HOSPITAL_BASED_OUTPATIENT_CLINIC_OR_DEPARTMENT_OTHER): Payer: Self-pay

## 2019-07-13 ENCOUNTER — Emergency Department (HOSPITAL_BASED_OUTPATIENT_CLINIC_OR_DEPARTMENT_OTHER)
Admission: EM | Admit: 2019-07-13 | Discharge: 2019-07-14 | Disposition: A | Discharge status: Home / Self Care | Payer: BC Managed Care – PPO | Attending: Emergency Medicine | Admitting: Emergency Medicine

## 2019-07-13 DIAGNOSIS — K5732 Diverticulitis of large intestine without perforation or abscess without bleeding: Secondary | ICD-10-CM | POA: Diagnosis not present

## 2019-07-13 DIAGNOSIS — I700 Atherosclerosis of aorta: Secondary | ICD-10-CM | POA: Diagnosis not present

## 2019-07-13 DIAGNOSIS — R110 Nausea: Secondary | ICD-10-CM | POA: Diagnosis not present

## 2019-07-13 DIAGNOSIS — Z9049 Acquired absence of other specified parts of digestive tract: Secondary | ICD-10-CM | POA: Diagnosis not present

## 2019-07-13 DIAGNOSIS — I10 Essential (primary) hypertension: Secondary | ICD-10-CM | POA: Insufficient documentation

## 2019-07-13 DIAGNOSIS — K5792 Diverticulitis of intestine, part unspecified, without perforation or abscess without bleeding: Secondary | ICD-10-CM

## 2019-07-13 DIAGNOSIS — R109 Unspecified abdominal pain: Secondary | ICD-10-CM | POA: Diagnosis not present

## 2019-07-13 DIAGNOSIS — Z79899 Other long term (current) drug therapy: Secondary | ICD-10-CM | POA: Diagnosis not present

## 2019-07-13 LAB — CBC
HCT: 38.9 % (ref 36.0–46.0)
Hemoglobin: 12.3 g/dL (ref 12.0–15.0)
MCH: 29 pg (ref 26.0–34.0)
MCHC: 31.6 g/dL (ref 30.0–36.0)
MCV: 91.7 fL (ref 80.0–100.0)
Platelets: 390 10*3/uL (ref 150–400)
RBC: 4.24 MIL/uL (ref 3.87–5.11)
RDW: 14.2 % (ref 11.5–15.5)
WBC: 13.2 10*3/uL — ABNORMAL HIGH (ref 4.0–10.5)
nRBC: 0 % (ref 0.0–0.2)

## 2019-07-13 LAB — COMPREHENSIVE METABOLIC PANEL
ALT: 29 U/L (ref 0–44)
AST: 23 U/L (ref 15–41)
Albumin: 3.9 g/dL (ref 3.5–5.0)
Alkaline Phosphatase: 126 U/L (ref 38–126)
Anion gap: 12 (ref 5–15)
BUN: 14 mg/dL (ref 8–23)
CO2: 28 mmol/L (ref 22–32)
Calcium: 9.1 mg/dL (ref 8.9–10.3)
Chloride: 99 mmol/L (ref 98–111)
Creatinine, Ser: 0.88 mg/dL (ref 0.44–1.00)
GFR calc Af Amer: >60 mL/min (ref >60)
GFR calc non Af Amer: >60 mL/min (ref >60)
Glucose, Bld: 108 mg/dL — ABNORMAL HIGH (ref 70–99)
Potassium: 3.7 mmol/L (ref 3.5–5.1)
Sodium: 139 mmol/L (ref 135–145)
Total Bilirubin: 0.4 mg/dL (ref 0.3–1.2)
Total Protein: 8.2 g/dL — ABNORMAL HIGH (ref 6.5–8.1)

## 2019-07-13 LAB — URINALYSIS, ROUTINE W REFLEX MICROSCOPIC
Bilirubin Urine: NEGATIVE
Glucose, UA: NEGATIVE mg/dL
Hgb urine dipstick: NEGATIVE
Ketones, ur: NEGATIVE mg/dL
Leukocytes,Ua: NEGATIVE
Nitrite: NEGATIVE
Protein, ur: NEGATIVE mg/dL
Specific Gravity, Urine: 1.02 (ref 1.005–1.030)
pH: 5.5 (ref 5.0–8.0)

## 2019-07-13 LAB — LIPASE, BLOOD: Lipase: 33 U/L (ref 11–51)

## 2019-07-13 MED ORDER — SODIUM CHLORIDE 0.9% FLUSH
3.0000 mL | Freq: Once | INTRAVENOUS | Status: DC
  Filled 2019-07-13: qty 3

## 2019-07-13 NOTE — ED Triage Notes (Signed)
Pt c/o abd pain, constipation x 1 week-no relief with OTC-last normal BM "over a week ago"-nausea x today-no vomiting-NAD-steady gait

## 2019-07-14 ENCOUNTER — Emergency Department (HOSPITAL_BASED_OUTPATIENT_CLINIC_OR_DEPARTMENT_OTHER): Payer: BC Managed Care – PPO

## 2019-07-14 ENCOUNTER — Encounter (HOSPITAL_BASED_OUTPATIENT_CLINIC_OR_DEPARTMENT_OTHER): Payer: Self-pay

## 2019-07-14 DIAGNOSIS — I700 Atherosclerosis of aorta: Secondary | ICD-10-CM | POA: Diagnosis not present

## 2019-07-14 DIAGNOSIS — Z9049 Acquired absence of other specified parts of digestive tract: Secondary | ICD-10-CM | POA: Diagnosis not present

## 2019-07-14 DIAGNOSIS — R110 Nausea: Secondary | ICD-10-CM | POA: Diagnosis not present

## 2019-07-14 DIAGNOSIS — K5732 Diverticulitis of large intestine without perforation or abscess without bleeding: Secondary | ICD-10-CM | POA: Diagnosis not present

## 2019-07-14 MED ORDER — AMOXICILLIN-POT CLAVULANATE 875-125 MG PO TABS
1.0000 | ORAL_TABLET | Freq: Two times a day (BID) | ORAL | 0 refills | Status: DC
Start: 2019-07-14 — End: 2019-08-15

## 2019-07-14 MED ORDER — AMOXICILLIN-POT CLAVULANATE 875-125 MG PO TABS
1.0000 | ORAL_TABLET | Freq: Once | ORAL | Status: AC
  Administered 2019-07-14: 1 via ORAL
  Filled 2019-07-14: qty 1

## 2019-07-14 MED ORDER — IOHEXOL 300 MG/ML  SOLN
100.0000 mL | Freq: Once | INTRAMUSCULAR | Status: AC | PRN
  Administered 2019-07-14: 100 mL via INTRAVENOUS

## 2019-07-14 MED ORDER — IOHEXOL 300 MG/ML  SOLN: 100.0000 mL | Freq: Once | INTRAMUSCULAR | Status: DC

## 2019-07-14 MED ORDER — ONDANSETRON 4 MG PO TBDP
ORAL_TABLET | ORAL | 0 refills | Status: DC
Start: 2019-07-14 — End: 2019-11-15

## 2019-07-14 NOTE — ED Provider Notes (Signed)
[facility] EMERGENCY DEPARTMENT Provider Note   CSN: [id number] Arrival date & time: 07/13/19  2106     History Chief Complaint  Patient presents with  . Abdominal Pain    [name] is a 64 y.o. female.  History of bowel obstruction but here today with abdominal pain for the last few days associated with a weeks worth of constipation.  Some mild nausea intermittently mild abdominal pain intermittently.  Still passing gas.  No fevers.  Has tried magnesium citrate which caused a little bit of stool but not significant.   Abdominal Pain      Past Medical History:  Diagnosis Date  . Anxiety disorder   . Chronic cough   . Complication of anesthesia    Had ERCP in 2006 and led to respiratory failure, caused pancreatits, was in coma for 30days.  . Depression   . GERD (gastroesophageal reflux disease)   . History of ERCP   . History of pancreatitis   . History of tracheostomy   . HTN (hypertension)   . Hyperglycemia   . Hypokalemia   . Respiratory failure [facility])     Patient Active Problem List   Diagnosis Date Noted  . Thiamine deficiency 02/14/2019  . Zinc deficiency 02/12/2019  . LAP-BAND surgery status 01/20/2018  . SUI (stress urinary incontinence, female) 10/13/2017  . Abnormal electrocardiogram (ECG) (EKG) 10/13/2017  . Multiple pulmonary nodules determined by computed tomography of lung 10/13/2017  . B12 deficiency anemia 10/23/2015  . Insomnia with sleep apnea 10/23/2015  . Asthma, mild intermittent 06/27/2015  . Primary osteoarthritis of both knees 04/26/2015  . Restless legs syndrome (RLS) 04/26/2015  . OAB (overactive bladder) 01/18/2014  . Routine general medical examination at a health care facility 07/18/2013  . Dyshidrotic eczema 04/13/2013  . Hyperlipidemia with target LDL less than 130 05/04/2012  . Obesity, Class III, BMI 40-49.9 (morbid obesity) (HCC) 05/13/2011  . OSA (obstructive sleep apnea) 01/22/2011  . Allergic rhinitis  09/30/2008  . Depression with anxiety 07/13/2008  . Vitamin D deficiency 06/15/2008  . Essential hypertension 06/01/2008  . Hyperglycemia 12/29/2006  . G E R D 12/24/2006    Past Surgical History:  Procedure Laterality Date  . ABDOMINAL HYSTERECTOMY     Total Hysterectomy   . APPENDECTOMY    . BREAST REDUCTION SURGERY    . CHOLECYSTECTOMY    . COLONOSCOPY WITH PROPOFOL N/A 12/21/2015   Procedure: COLONOSCOPY WITH PROPOFOL;  Surgeon: Jeani Hawking, MD;  Location: [facility] ENDOSCOPY;  Service: Endoscopy;  Laterality: N/A;  . ERCP    . ESOPHAGOGASTRODUODENOSCOPY N/A 12/21/2015   Procedure: ESOPHAGOGASTRODUODENOSCOPY (EGD);  Surgeon: Jeani Hawking, MD;  Location: [facility] ENDOSCOPY;  Service: Endoscopy;  Laterality: N/A;  . GIVENS CAPSULE STUDY N/A 01/29/2016   Procedure: GIVENS CAPSULE STUDY;  Surgeon: Charna Elizabeth, MD;  Location: [facility] ENDOSCOPY;  Service: Endoscopy;  Laterality: N/A;  . HAMMER TOE SURGERY    . INTESTINAL MALROTATION REPAIR  1999  . TUBAL LIGATION       OB History   No obstetric history on file.     Family History  Problem Relation Age of Onset  . Diabetes Other   . Hypertension Other     Social History   Tobacco Use  . Smoking status: Never Smoker  . Smokeless tobacco: Never Used  Vaping Use  . Vaping Use: Never used  Substance Use Topics  . Alcohol use: No  . Drug use: No    Home Medications Prior to Admission medications  Medication Sig Start Date End Date Taking? Authorizing Provider  amoxicillin-clavulanate (AUGMENTIN) 875-125 MG tablet Take 1 tablet by mouth 2 (two) times daily. One po bid x 7 days 07/14/19   Maybelle Depaoli, Barbara Cower, MD  Crisaborole (EUCRISA) 2 % OINT Apply 1 Act topically 2 (two) times daily. 03/09/18   Etta Grandchild, MD  esomeprazole (NEXIUM) 40 MG capsule TAKE 1 CAPSULE (40 MG TOTAL) BY MOUTH DAILY AT 12 NOON. 11/05/18   Etta Grandchild, MD  ondansetron (ZOFRAN ODT) 4 MG disintegrating tablet 4mg  ODT q4 hours prn nausea/vomit 07/14/19   Mila Pair, 09/14/19,  MD  POTASSIUM CHLORIDE PO Take by mouth.    [provider]  thiamine 100 MG tablet Take 1 tablet (100 mg total) by mouth daily. 02/14/19   04/14/19, MD  triamterene-hydrochlorothiazide (MAXZIDE-25) 37.5-25 MG tablet TAKE 1 TABLET BY MOUTH EVERY DAY 05/01/19   05/03/19, MD  Vitamin D, Ergocalciferol, (DRISDOL) 1.25 MG (50000 UNIT) CAPS capsule Take 1 capsule (50,000 Units total) by mouth once a week. 05/25/19   05/27/19, MD  zinc gluconate 50 MG tablet Take 1 tablet (50 mg total) by mouth every other day. 02/12/19   04/12/19, MD  DULoxetine (CYMBALTA) 60 MG capsule Take 1 capsule (60 mg total) by mouth daily. 08/10/18   10/10/18, MD  promethazine (PHENERGAN) 25 MG tablet Take 1 tablet (25 mg total) by mouth every 6 (six) hours as needed for nausea or vomiting. 02/20/15 02/20/15  02/22/15, MD    Allergies    Fastin [phentermine], Erythromycin, and Lisinopril  Review of Systems   Review of Systems  Gastrointestinal: Positive for abdominal pain.  All other systems reviewed and are negative.   Physical Exam Updated Vital Signs BP 139/81   Pulse 68   Temp 98.3 F (36.8 C) (Oral)   Resp 15   Ht 5\' 3"  (1.6 m)   Wt 115.2 kg   SpO2 98%   BMI 44.99 kg/m   Physical Exam Vitals and nursing note reviewed.  Constitutional:      Appearance: She is well-developed.  HENT:     Head: Normocephalic and atraumatic.     Mouth/Throat:     Mouth: Mucous membranes are moist.     Pharynx: Oropharynx is clear.  Eyes:     Conjunctiva/sclera: Conjunctivae normal.     Pupils: Pupils are equal, round, and reactive to light.  Cardiovascular:     Rate and Rhythm: Normal rate and regular rhythm.  Pulmonary:     Effort: No respiratory distress.     Breath sounds: No stridor.  Chest:     Chest wall: No tenderness.  Abdominal:     General: There is no distension.  Musculoskeletal:        General: No swelling or tenderness. Normal range of motion.     Cervical  back: Normal range of motion.  Skin:    General: Skin is warm and dry.     Coloration: Skin is not jaundiced or pale.     Findings: No erythema or rash.  Neurological:     General: No focal deficit present.     Mental Status: She is alert.     ED Results / Procedures / Treatments   Labs (all labs ordered are listed, but only abnormal results are displayed) Labs Reviewed  COMPREHENSIVE METABOLIC PANEL - Abnormal; Notable for the following components:      Result Value   Glucose, Bld 108 (*)  Total Protein 8.2 (*)    All other components within normal limits  CBC - Abnormal; Notable for the following components:   WBC 13.2 (*)    All other components within normal limits  LIPASE, BLOOD  URINALYSIS, ROUTINE W REFLEX MICROSCOPIC    EKG None  Radiology CT ABDOMEN PELVIS W CONTRAST  Result Date: 07/14/2019 CLINICAL DATA:  Abdominal pain and nausea EXAM: CT ABDOMEN AND PELVIS WITH CONTRAST TECHNIQUE: Multidetector CT imaging of the abdomen and pelvis was performed using the standard protocol following bolus administration of intravenous contrast. CONTRAST:  OMNIPAQUE IOHEXOL 300 MG/ML  SOLN COMPARISON:  02/20/2015 FINDINGS: LOWER CHEST: Normal. HEPATOBILIARY: Normal hepatic contours. No intra- or extrahepatic biliary dilatation. Status post cholecystectomy. PANCREAS: Normal pancreas. No ductal dilatation or peripancreatic fluid collection. SPLEEN: Normal. ADRENALS/URINARY TRACT: The adrenal glands are normal. No hydronephrosis, nephroureterolithiasis or solid renal mass. The urinary bladder is normal for degree of distention STOMACH/BOWEL: There is no hiatal hernia. Normal duodenal course and caliber. No small bowel dilatation or inflammation. There is sigmoid and distal descending colon diverticulosis with an area acute inflammation extending from the distal descending colon into the proximal sigmoid colon. There is no fluid collection or free intraperitoneal air. The appendix is  not visualized. No right lower quadrant inflammation or free fluid. VASCULAR/LYMPHATIC: There is calcific atherosclerosis of the abdominal aorta. No lymphadenopathy. REPRODUCTIVE: Normal prostate size with symmetric seminal vesicles. MUSCULOSKELETAL. No bony spinal canal stenosis or focal osseous abnormality. OTHER: None. IMPRESSION: 1. Acute diverticulitis of the distal descending colon and proximal sigmoid colon without abscess or free intraperitoneal air. 2. Aortic Atherosclerosis (ICD10-I70.0). Electronically Signed   By: Deatra Robinson M.D.   On: 07/14/2019 01:15    Procedures Procedures (including critical care time)  Medications Ordered in ED Medications  iohexol (OMNIPAQUE) 300 MG/ML solution 100 mL (has no administration in time range)  iohexol (OMNIPAQUE) 300 MG/ML solution 100 mL (100 mLs Intravenous Contrast Given 07/14/19 0044)  amoxicillin-clavulanate (AUGMENTIN) 875-125 MG per tablet 1 tablet (1 tablet Oral Given 07/14/19 0155)    ED Course  I have reviewed the triage vital signs and the nursing notes.  Pertinent labs & imaging results that were available during my care of the patient were reviewed by me and considered in my medical decision making (see chart for details).    MDM Rules/Calculators/A&P                          Ultimately found to have diverticulitis as likely cause for constipation. Abdomen is non-surgical. Labs reassurring. abx started. Stable for dc.   Final Clinical Impression(s) / ED Diagnoses Final diagnoses:  Diverticulitis    Rx / DC Orders ED Discharge Orders         Ordered    amoxicillin-clavulanate (AUGMENTIN) 875-125 MG tablet  2 times daily     Discontinue  Reprint     07/14/19 0157    ondansetron (ZOFRAN ODT) 4 MG disintegrating tablet     Discontinue  Reprint     07/14/19 0157           Loray Akard, Barbara Cower, MD 07/14/19 5102

## 2019-07-14 NOTE — ED Notes (Signed)
ED Provider at bedside. 

## 2019-07-14 NOTE — ED Notes (Signed)
Patient transported to CT 

## 2019-07-26 DIAGNOSIS — K5904 Chronic idiopathic constipation: Secondary | ICD-10-CM | POA: Diagnosis not present

## 2019-07-26 DIAGNOSIS — R1032 Left lower quadrant pain: Secondary | ICD-10-CM | POA: Diagnosis not present

## 2019-07-26 DIAGNOSIS — R933 Abnormal findings on diagnostic imaging of other parts of digestive tract: Secondary | ICD-10-CM | POA: Diagnosis not present

## 2019-08-05 ENCOUNTER — Other Ambulatory Visit: Payer: Self-pay | Admitting: Internal Medicine

## 2019-08-05 DIAGNOSIS — F418 Other specified anxiety disorders: Secondary | ICD-10-CM

## 2019-08-11 ENCOUNTER — Other Ambulatory Visit: Payer: Self-pay | Admitting: Internal Medicine

## 2019-08-11 DIAGNOSIS — E559 Vitamin D deficiency, unspecified: Secondary | ICD-10-CM

## 2019-08-15 ENCOUNTER — Ambulatory Visit: Payer: BC Managed Care – PPO | Admitting: Internal Medicine

## 2019-08-15 ENCOUNTER — Encounter: Payer: Self-pay | Admitting: Internal Medicine

## 2019-08-15 ENCOUNTER — Other Ambulatory Visit: Payer: Self-pay

## 2019-08-15 VITALS — BP 136/72 | HR 80 | Temp 98.1°F | Resp 16 | Ht 63.0 in | Wt 249.4 lb

## 2019-08-15 DIAGNOSIS — D510 Vitamin B12 deficiency anemia due to intrinsic factor deficiency: Secondary | ICD-10-CM

## 2019-08-15 DIAGNOSIS — I10 Essential (primary) hypertension: Secondary | ICD-10-CM | POA: Diagnosis not present

## 2019-08-15 DIAGNOSIS — K2100 Gastro-esophageal reflux disease with esophagitis, without bleeding: Secondary | ICD-10-CM

## 2019-08-15 DIAGNOSIS — R7303 Prediabetes: Secondary | ICD-10-CM

## 2019-08-15 MED ORDER — OZEMPIC (0.25 OR 0.5 MG/DOSE) 2 MG/1.5ML ~~LOC~~ SOPN: 0.2500 mg | PEN_INJECTOR | SUBCUTANEOUS | 0 refills | Status: DC

## 2019-08-15 MED ORDER — ESOMEPRAZOLE MAGNESIUM 40 MG PO CPDR: 40.0000 mg | DELAYED_RELEASE_CAPSULE | Freq: Every day | ORAL | 1 refills | Status: DC

## 2019-08-15 MED ORDER — TRIAMTERENE-HCTZ 37.5-25 MG PO TABS: 1.0000 | ORAL_TABLET | Freq: Every day | ORAL | 0 refills | Status: DC

## 2019-08-15 NOTE — Patient Instructions (Signed)

## 2019-08-15 NOTE — Progress Notes (Signed)
Subjective:  Patient ID: [name], female    DOB: 1956/01/06  Age: 64 y.o. MRN: [id number]  CC: Hypertension and Gastroesophageal Reflux  This visit occurred during the SARS-CoV-2 public health emergency.  Safety protocols were in place, including screening questions prior to the visit, additional usage of staff PPE, and extensive cleaning of exam room while observing appropriate contact time as indicated for disinfecting solutions.    HPI [name] presents for f/up - She recently ran out of her PPI and since then has had worsening heartburn and nausea.  She denies vomiting, abdominal pain, melena, loss of appetite, odynophagia, dysphagia, or bright red blood per rectum.  Outpatient Medications Prior to Visit  Medication Sig Dispense Refill  . Crisaborole (EUCRISA) 2 % OINT Apply 1 Act topically 2 (two) times daily. 60 g 5  . ondansetron (ZOFRAN ODT) 4 MG disintegrating tablet 4mg  ODT q4 hours prn nausea/vomit 30 tablet 0  . POTASSIUM CHLORIDE PO Take by mouth.    . thiamine 100 MG tablet Take 1 tablet (100 mg total) by mouth daily. 90 tablet 1  . Vitamin D, Ergocalciferol, (DRISDOL) 1.25 MG (50000 UNIT) CAPS capsule TAKE 1 CAPSULE BY MOUTH ONE TIME PER WEEK 12 capsule 0  . zinc gluconate 50 MG tablet Take 1 tablet (50 mg total) by mouth every other day. 45 tablet 1  . amoxicillin-clavulanate (AUGMENTIN) 875-125 MG tablet Take 1 tablet by mouth 2 (two) times daily. One po bid x 7 days 14 tablet 0  . esomeprazole (NEXIUM) 40 MG capsule TAKE 1 CAPSULE (40 MG TOTAL) BY MOUTH DAILY AT 12 NOON. 90 capsule 1  . triamterene-hydrochlorothiazide (MAXZIDE-25) 37.5-25 MG tablet TAKE 1 TABLET BY MOUTH EVERY DAY 90 tablet 0   No facility-administered medications prior to visit.    ROS Review of Systems  Constitutional: Negative.  Negative for chills, diaphoresis, fatigue and fever.  HENT: Negative.  Negative for trouble swallowing and voice change.   Respiratory: Negative for cough, chest  tightness, shortness of breath and wheezing.   Cardiovascular: Negative for chest pain, palpitations and leg swelling.  Gastrointestinal: Positive for nausea. Negative for abdominal pain, blood in stool, constipation, diarrhea, rectal pain and vomiting.       ++heartburn and nausea  Endocrine: Negative.   Genitourinary: Negative.  Negative for difficulty urinating.  Musculoskeletal: Negative.   Skin: Negative.   Neurological: Negative.  Negative for dizziness, weakness, light-headedness and headaches.  Hematological: Negative for adenopathy. Does not bruise/bleed easily.  Psychiatric/Behavioral: Negative.     Objective:  BP 136/72 (BP Location: Left Arm, Patient Position: Sitting, Cuff Size: Large)   Pulse 80   Temp 98.1 F (36.7 C) (Oral)   Resp 16   Ht 5\' 3"  (1.6 m)   Wt 249 lb 6.4 oz (113.1 kg)   SpO2 96%   BMI 44.18 kg/m   BP Readings from Last 3 Encounters:  08/15/19 136/72  07/14/19 139/81  02/16/19 (!) 110/58    Wt Readings from Last 3 Encounters:  08/15/19 249 lb 6.4 oz (113.1 kg)  07/13/19 254 lb (115.2 kg)  02/10/19 257 lb (116.6 kg)    Physical Exam Vitals reviewed.  Constitutional:      Appearance: She is obese.  HENT:     Nose: Nose normal.     Mouth/Throat:     Mouth: Mucous membranes are moist.  Eyes:     General: No scleral icterus.    Conjunctiva/sclera: Conjunctivae normal.  Cardiovascular:     Rate and  Rhythm: Normal rate and regular rhythm.     Heart sounds: No murmur heard.   Pulmonary:     Effort: Pulmonary effort is normal.     Breath sounds: No stridor. No wheezing, rhonchi or rales.  Abdominal:     General: Abdomen is protuberant. Bowel sounds are normal. There is no distension.     Palpations: Abdomen is soft. There is no hepatomegaly, splenomegaly or mass.     Tenderness: There is no abdominal tenderness.     Hernia: No hernia is present.  Musculoskeletal:        General: Normal range of motion.     Cervical back: Neck supple.       Right lower leg: No edema.     Left lower leg: No edema.  Lymphadenopathy:     Cervical: No cervical adenopathy.  Skin:    General: Skin is warm and dry.     Coloration: Skin is not jaundiced or pale.  Neurological:     General: No focal deficit present.     Mental Status: She is alert.  Psychiatric:        Mood and Affect: Mood normal.        Behavior: Behavior normal.     Lab Results  Component Value Date   WBC 13.2 (H) 07/13/2019   HGB 12.3 07/13/2019   HCT 38.9 07/13/2019   PLT 390 07/13/2019   GLUCOSE 108 (H) 07/13/2019   CHOL 180 02/10/2019   TRIG 95.0 02/10/2019   HDL 49.90 02/10/2019   LDLCALC 111 (H) 02/10/2019   ALT 29 07/13/2019   AST 23 07/13/2019   NA 139 07/13/2019   K 3.7 07/13/2019   CL 99 07/13/2019   CREATININE 0.88 07/13/2019   BUN 14 07/13/2019   CO2 28 07/13/2019   TSH 1.57 02/10/2019   HGBA1C 6.0 02/10/2019    CT ABDOMEN PELVIS W CONTRAST  Result Date: 07/14/2019 CLINICAL DATA:  Abdominal pain and nausea EXAM: CT ABDOMEN AND PELVIS WITH CONTRAST TECHNIQUE: Multidetector CT imaging of the abdomen and pelvis was performed using the standard protocol following bolus administration of intravenous contrast. CONTRAST:  OMNIPAQUE IOHEXOL 300 MG/ML  SOLN COMPARISON:  02/20/2015 FINDINGS: LOWER CHEST: Normal. HEPATOBILIARY: Normal hepatic contours. No intra- or extrahepatic biliary dilatation. Status post cholecystectomy. PANCREAS: Normal pancreas. No ductal dilatation or peripancreatic fluid collection. SPLEEN: Normal. ADRENALS/URINARY TRACT: The adrenal glands are normal. No hydronephrosis, nephroureterolithiasis or solid renal mass. The urinary bladder is normal for degree of distention STOMACH/BOWEL: There is no hiatal hernia. Normal duodenal course and caliber. No small bowel dilatation or inflammation. There is sigmoid and distal descending colon diverticulosis with an area acute inflammation extending from the distal descending colon into the  proximal sigmoid colon. There is no fluid collection or free intraperitoneal air. The appendix is not visualized. No right lower quadrant inflammation or free fluid. VASCULAR/LYMPHATIC: There is calcific atherosclerosis of the abdominal aorta. No lymphadenopathy. REPRODUCTIVE: Normal prostate size with symmetric seminal vesicles. MUSCULOSKELETAL. No bony spinal canal stenosis or focal osseous abnormality. OTHER: None. IMPRESSION: 1. Acute diverticulitis of the distal descending colon and proximal sigmoid colon without abscess or free intraperitoneal air. 2. Aortic Atherosclerosis (ICD10-I70.0). Electronically Signed   By: Deatra Robinson M.D.   On: 07/14/2019 01:15    Assessment & Plan:   [name] was seen today for hypertension and gastroesophageal reflux.  Diagnoses and all orders for this visit:  Essential hypertension- Her blood pressure is adequately well controlled.  Recent electrolytes and  renal function were normal.  Will continue the combination of triamterene and -     triamterene-hydrochlorothiazide (MAXZIDE-25) 37.5-25 MG tablet; Take 1 tablet by mouth daily.  Vitamin B12 deficiency anemia due to intrinsic factor deficiency- She will continue parenteral B12 replacement therapy.  GERD with esophagitis- She agrees to restart the PPI. -     esomeprazole (NEXIUM) 40 MG capsule; Take 1 capsule (40 mg total) by mouth daily at 12 noon.  Prediabetes- I recommended that she start using a GLP-1 agonist to reduce the risk of development of DM2 and to help her with her goal to lose weight. -     Semaglutide,0.25 or 0.5MG /DOS, (OZEMPIC, 0.25 OR 0.5 MG/DOSE,) 2 MG/1.5ML SOPN; Inject 0.1875 mLs (0.25 mg total) into the skin once a week.   I have discontinued [name]'s amoxicillin-clavulanate. I have also changed her triamterene-hydrochlorothiazide. Additionally, I am having her start on Ozempic (0.25 or 0.5 MG/DOSE). Lastly, I am having her maintain her POTASSIUM CHLORIDE PO, Crisaborole, zinc  gluconate, thiamine, ondansetron, Vitamin D (Ergocalciferol), and esomeprazole.  Meds ordered this encounter  Medications  . triamterene-hydrochlorothiazide (MAXZIDE-25) 37.5-25 MG tablet    Sig: Take 1 tablet by mouth daily.    Dispense:  90 tablet    Refill:  0  . esomeprazole (NEXIUM) 40 MG capsule    Sig: Take 1 capsule (40 mg total) by mouth daily at 12 noon.    Dispense:  90 capsule    Refill:  1  . Semaglutide,0.25 or 0.5MG /DOS, (OZEMPIC, 0.25 OR 0.5 MG/DOSE,) 2 MG/1.5ML SOPN    Sig: Inject 0.1875 mLs (0.25 mg total) into the skin once a week.    Dispense:  1.5 mL    Refill:  0     Follow-up: Return in about 3 months (around 11/15/2019).  [name], MD

## 2019-08-18 DIAGNOSIS — G4733 Obstructive sleep apnea (adult) (pediatric): Secondary | ICD-10-CM | POA: Diagnosis not present

## 2019-09-09 ENCOUNTER — Other Ambulatory Visit: Payer: Self-pay | Admitting: Internal Medicine

## 2019-09-09 DIAGNOSIS — E60 Dietary zinc deficiency: Secondary | ICD-10-CM

## 2019-09-09 DIAGNOSIS — Z9884 Bariatric surgery status: Secondary | ICD-10-CM

## 2019-09-09 DIAGNOSIS — F418 Other specified anxiety disorders: Secondary | ICD-10-CM

## 2019-09-21 ENCOUNTER — Other Ambulatory Visit: Payer: Self-pay | Admitting: Internal Medicine

## 2019-09-21 DIAGNOSIS — R7303 Prediabetes: Secondary | ICD-10-CM

## 2019-09-22 ENCOUNTER — Other Ambulatory Visit: Payer: Self-pay | Admitting: Internal Medicine

## 2019-09-22 DIAGNOSIS — R7303 Prediabetes: Secondary | ICD-10-CM

## 2019-09-22 MED ORDER — OZEMPIC (1 MG/DOSE) 4 MG/3ML ~~LOC~~ SOPN: 0.5000 mg | PEN_INJECTOR | SUBCUTANEOUS | 0 refills | Status: DC

## 2019-11-10 ENCOUNTER — Other Ambulatory Visit: Payer: Self-pay | Admitting: Internal Medicine

## 2019-11-10 DIAGNOSIS — I10 Essential (primary) hypertension: Secondary | ICD-10-CM

## 2019-11-12 ENCOUNTER — Other Ambulatory Visit: Payer: Self-pay | Admitting: Internal Medicine

## 2019-11-12 DIAGNOSIS — E559 Vitamin D deficiency, unspecified: Secondary | ICD-10-CM

## 2019-11-15 ENCOUNTER — Ambulatory Visit: Payer: BC Managed Care – PPO | Admitting: Internal Medicine

## 2019-11-15 ENCOUNTER — Other Ambulatory Visit: Payer: Self-pay

## 2019-11-15 ENCOUNTER — Encounter: Payer: Self-pay | Admitting: Internal Medicine

## 2019-11-15 VITALS — BP 132/82 | HR 88 | Temp 97.7°F | Resp 16 | Ht 63.0 in | Wt 250.0 lb

## 2019-11-15 DIAGNOSIS — R7303 Prediabetes: Secondary | ICD-10-CM | POA: Diagnosis not present

## 2019-11-15 DIAGNOSIS — E519 Thiamine deficiency, unspecified: Secondary | ICD-10-CM

## 2019-11-15 DIAGNOSIS — E60 Dietary zinc deficiency: Secondary | ICD-10-CM

## 2019-11-15 DIAGNOSIS — M545 Low back pain, unspecified: Secondary | ICD-10-CM | POA: Diagnosis not present

## 2019-11-15 DIAGNOSIS — I10 Essential (primary) hypertension: Secondary | ICD-10-CM | POA: Diagnosis not present

## 2019-11-15 DIAGNOSIS — Z23 Encounter for immunization: Secondary | ICD-10-CM

## 2019-11-15 MED ORDER — RYBELSUS 3 MG PO TABS: 1.0000 | ORAL_TABLET | Freq: Every day | ORAL | 0 refills | Status: DC

## 2019-11-15 MED ORDER — METHYLPREDNISOLONE 4 MG PO TBPK: ORAL_TABLET | ORAL | 0 refills | Status: AC

## 2019-11-15 NOTE — Patient Instructions (Signed)
Acute Back Pain, Adult Acute back pain is sudden and usually short-lived. It is often caused by an injury to the muscles and tissues in the back. The injury may result from:  A muscle or ligament getting overstretched or torn (strained). Ligaments are tissues that connect bones to each other. Lifting something improperly can cause a back strain.  Wear and tear (degeneration) of the spinal disks. Spinal disks are circular tissue that provides cushioning between the bones of the spine (vertebrae).  Twisting motions, such as while playing sports or doing yard work.  A hit to the back.  Arthritis. You may have a physical exam, lab tests, and imaging tests to find the cause of your pain. Acute back pain usually goes away with rest and home care. Follow these instructions at home: Managing pain, stiffness, and swelling  Take over-the-counter and prescription medicines only as told by your health care provider.  Your health care provider may recommend applying ice during the first 24-48 hours after your pain starts. To do this: ? Put ice in a plastic bag. ? Place a towel between your skin and the bag. ? Leave the ice on for 20 minutes, 2-3 times a day.  If directed, apply heat to the affected area as often as told by your health care provider. Use the heat source that your health care provider recommends, such as a moist heat pack or a heating pad. ? Place a towel between your skin and the heat source. ? Leave the heat on for 20-30 minutes. ? Remove the heat if your skin turns bright red. This is especially important if you are unable to feel pain, heat, or cold. You have a greater risk of getting burned. Activity   Do not stay in bed. Staying in bed for more than 1-2 days can delay your recovery.  Sit up and stand up straight. Avoid leaning forward when you sit, or hunching over when you stand. ? If you work at a desk, sit close to it so you do not need to lean over. Keep your chin tucked  in. Keep your neck drawn back, and keep your elbows bent at a right angle. Your arms should look like the letter "L." ? Sit high and close to the steering wheel when you drive. Add lower back (lumbar) support to your car seat, if needed.  Take short walks on even surfaces as soon as you are able. Try to increase the length of time you walk each day.  Do not sit, drive, or stand in one place for more than 30 minutes at a time. Sitting or standing for long periods of time can put stress on your back.  Do not drive or use heavy machinery while taking prescription pain medicine.  Use proper lifting techniques. When you bend and lift, use positions that put less stress on your back: ? Bend your knees. ? Keep the load close to your body. ? Avoid twisting.  Exercise regularly as told by your health care provider. Exercising helps your back heal faster and helps prevent back injuries by keeping muscles strong and flexible.  Work with a physical therapist to make a safe exercise program, as recommended by your health care provider. Do any exercises as told by your physical therapist. Lifestyle  Maintain a healthy weight. Extra weight puts stress on your back and makes it difficult to have good posture.  Avoid activities or situations that make you feel anxious or stressed. Stress and anxiety increase muscle   tension and can make back pain worse. Learn ways to manage anxiety and stress, such as through exercise. General instructions  Sleep on a firm mattress in a comfortable position. Try lying on your side with your knees slightly bent. If you lie on your back, put a pillow under your knees.  Follow your treatment plan as told by your health care provider. This may include: ? Cognitive or behavioral therapy. ? Acupuncture or massage therapy. ? Meditation or yoga. Contact a health care provider if:  You have pain that is not relieved with rest or medicine.  You have increasing pain going down  into your legs or buttocks.  Your pain does not improve after 2 weeks.  You have pain at night.  You lose weight without trying.  You have a fever or chills. Get help right away if:  You develop new bowel or bladder control problems.  You have unusual weakness or numbness in your arms or legs.  You develop nausea or vomiting.  You develop abdominal pain.  You feel faint. Summary  Acute back pain is sudden and usually short-lived.  Use proper lifting techniques. When you bend and lift, use positions that put less stress on your back.  Take over-the-counter and prescription medicines and apply heat or ice as directed by your health care provider. This information is not intended to replace advice given to you by your health care provider. Make sure you discuss any questions you have with your health care provider. Document Revised: 04/13/2018 Document Reviewed: 08/06/2016 Elsevier Patient Education  2020 [employer]  

## 2019-11-15 NOTE — Progress Notes (Signed)
Subjective:  Patient ID: [name], female    DOB: July 01, 1955  Age: 64 y.o. MRN: [id number]  CC: Hypertension and Back Pain  This visit occurred during the SARS-CoV-2 public health emergency.  Safety protocols were in place, including screening questions prior to the visit, additional usage of staff PPE, and extensive cleaning of exam room while observing appropriate contact time as indicated for disinfecting solutions.    HPI Bryson Palen presents for f/up -  1.  She complains of a 1 week history of nontraumatic left lower back pain that radiates towards her left hip.  She denies lower extremity paresthesias.  She has not gotten much symptom relief with over-the-counter remedies.  2.  She is not using Ozempic because it was too expensive.  She can turns to be concerned about her blood sugar and her weight.  She is working on her lifestyle modifications.  3.  She tells me her blood pressure has been adequately well controlled.  She denies any recent episodes of dizziness, lightheadedness, DOE, chest pain, palpitations, or edema.  Outpatient Medications Prior to Visit  Medication Sig Dispense Refill  . Crisaborole (EUCRISA) 2 % OINT Apply 1 Act topically 2 (two) times daily. 60 g 5  . [employer] ZINC GLUCONATE 50 MG tablet TAKE 1 TABLET (50 MG TOTAL) BY MOUTH EVERY OTHER DAY. 45 tablet 1  . DULoxetine (CYMBALTA) 60 MG capsule TAKE 1 CAPSULE BY MOUTH EVERY DAY 90 capsule 1  . esomeprazole (NEXIUM) 40 MG capsule Take 1 capsule (40 mg total) by mouth daily at 12 noon. 90 capsule 1  . POTASSIUM CHLORIDE PO Take by mouth.    . thiamine 100 MG tablet Take 1 tablet (100 mg total) by mouth daily. 90 tablet 1  . triamterene-hydrochlorothiazide (MAXZIDE-25) 37.5-25 MG tablet TAKE 1 TABLET BY MOUTH EVERY DAY 90 tablet 0  . Vitamin D, Ergocalciferol, (DRISDOL) 1.25 MG (50000 UNIT) CAPS capsule TAKE 1 CAPSULE BY MOUTH ONE TIME PER WEEK 12 capsule 0  . ondansetron (ZOFRAN ODT) 4 MG disintegrating tablet  4mg  ODT q4 hours prn nausea/vomit 30 tablet 0  . Semaglutide, 1 MG/DOSE, (OZEMPIC, 1 MG/DOSE,) 4 MG/3ML SOPN Inject 0.5 mg into the skin once a week. 9 mL 0   No facility-administered medications prior to visit.    ROS Review of Systems  Constitutional: Positive for unexpected weight change (wt gain). Negative for appetite change, chills and fatigue.  HENT: Negative.  Negative for sore throat and trouble swallowing.   Eyes: Negative.   Respiratory: Negative for cough, chest tightness, shortness of breath and wheezing.   Cardiovascular: Negative for chest pain, palpitations and leg swelling.  Gastrointestinal: Negative for abdominal pain, constipation, diarrhea, nausea and vomiting.  Endocrine: Negative.   Genitourinary: Negative.  Negative for difficulty urinating, dysuria and urgency.  Musculoskeletal: Positive for back pain. Negative for arthralgias, myalgias and neck pain.  Skin: Negative.  Negative for color change and rash.  Neurological: Negative.  Negative for dizziness, weakness, light-headedness and headaches.  Hematological: Negative for adenopathy. Does not bruise/bleed easily.  Psychiatric/Behavioral: Negative.     Objective:  BP 132/82   Pulse 88   Temp 97.7 F (36.5 C) (Oral)   Resp 16   Ht 5\' 3"  (1.6 m)   Wt 250 lb (113.4 kg)   SpO2 94%   BMI 44.29 kg/m   BP Readings from Last 3 Encounters:  11/15/19 132/82  08/15/19 136/72  07/14/19 139/81    Wt Readings from Last 3 Encounters:  11/15/19  250 lb (113.4 kg)  08/15/19 249 lb 6.4 oz (113.1 kg)  07/13/19 254 lb (115.2 kg)    Physical Exam Vitals reviewed.  Constitutional:      General: She is not in acute distress.    Appearance: She is obese. She is not toxic-appearing or diaphoretic.  HENT:     Nose: Nose normal.     Mouth/Throat:     Mouth: Mucous membranes are moist.  Eyes:     General: No scleral icterus.    Conjunctiva/sclera: Conjunctivae normal.  Cardiovascular:     Rate and Rhythm:  Normal rate and regular rhythm.     Heart sounds: No murmur heard.   Pulmonary:     Effort: Pulmonary effort is normal.     Breath sounds: No stridor. No wheezing, rhonchi or rales.  Abdominal:     General: Abdomen is protuberant. There is no distension.     Palpations: There is no hepatomegaly, splenomegaly or mass.     Tenderness: There is no abdominal tenderness.  Musculoskeletal:     Cervical back: Neck supple.  Lymphadenopathy:     Cervical: No cervical adenopathy.  Neurological:     Mental Status: She is alert.     Lab Results  Component Value Date   WBC 7.3 11/16/2019   HGB 12.3 11/16/2019   HCT 37.6 11/16/2019   PLT 278.0 11/16/2019   GLUCOSE 85 11/16/2019   CHOL 180 02/10/2019   TRIG 95.0 02/10/2019   HDL 49.90 02/10/2019   LDLCALC 111 (H) 02/10/2019   ALT 29 07/13/2019   AST 23 07/13/2019   NA 138 11/16/2019   K 3.5 11/16/2019   CL 100 11/16/2019   CREATININE 0.79 11/16/2019   BUN 13 11/16/2019   CO2 30 11/16/2019   TSH 1.57 02/10/2019   HGBA1C 5.9 11/16/2019    CT ABDOMEN PELVIS W CONTRAST  Result Date: 07/14/2019 CLINICAL DATA:  Abdominal pain and nausea EXAM: CT ABDOMEN AND PELVIS WITH CONTRAST TECHNIQUE: Multidetector CT imaging of the abdomen and pelvis was performed using the standard protocol following bolus administration of intravenous contrast. CONTRAST:  OMNIPAQUE IOHEXOL 300 MG/ML  SOLN COMPARISON:  02/20/2015 FINDINGS: LOWER CHEST: Normal. HEPATOBILIARY: Normal hepatic contours. No intra- or extrahepatic biliary dilatation. Status post cholecystectomy. PANCREAS: Normal pancreas. No ductal dilatation or peripancreatic fluid collection. SPLEEN: Normal. ADRENALS/URINARY TRACT: The adrenal glands are normal. No hydronephrosis, nephroureterolithiasis or solid renal mass. The urinary bladder is normal for degree of distention STOMACH/BOWEL: There is no hiatal hernia. Normal duodenal course and caliber. No small bowel dilatation or inflammation. There  is sigmoid and distal descending colon diverticulosis with an area acute inflammation extending from the distal descending colon into the proximal sigmoid colon. There is no fluid collection or free intraperitoneal air. The appendix is not visualized. No right lower quadrant inflammation or free fluid. VASCULAR/LYMPHATIC: There is calcific atherosclerosis of the abdominal aorta. No lymphadenopathy. REPRODUCTIVE: Normal prostate size with symmetric seminal vesicles. MUSCULOSKELETAL. No bony spinal canal stenosis or focal osseous abnormality. OTHER: None. IMPRESSION: 1. Acute diverticulitis of the distal descending colon and proximal sigmoid colon without abscess or free intraperitoneal air. 2. Aortic Atherosclerosis (ICD10-I70.0). Electronically Signed   By: Deatra Robinson M.D.   On: 07/14/2019 01:15    Assessment & Plan:   Loveda was seen today for hypertension and back pain.  Diagnoses and all orders for this visit:  Essential hypertension- Her blood pressure is adequately well controlled.  Electrolytes and renal function are normal. -  Basic metabolic panel; Future  Prediabetes- I recommended she change to Rybelsus. -     Semaglutide (RYBELSUS) 3 MG TABS; Take 1 tablet by mouth daily. -     Basic metabolic panel; Future -     Hemoglobin A1c; Future  Acute left-sided low back pain without sciatica- Based on her symptoms and exam I think she has a disc herniation.  She is neurologically intact.  I recommended that she take a 6-day course of methylprednisolone. -     methylPREDNISolone (MEDROL DOSEPAK) 4 MG TBPK tablet; TAKE AS DIRECTED  Thiamine deficiency- Her H&H and thiamine level are normal now.  Will continue the thiamine supplement. -     CBC with Differential/Platelet; Future -     Vitamin B1; Future  Zinc deficiency- Her zinc level is normal now.  Will continue the zinc supplement. -     CBC with Differential/Platelet; Future -     Zinc; Future  Other orders -     Flu Vaccine  QUAD 6+ mos PF IM (Fluarix Quad PF) -     Pneumococcal polysaccharide vaccine 23-valent greater than or equal to 2yo subcutaneous/IM   I have discontinued Klaire Lichtenberg's ondansetron and Ozempic (1 MG/DOSE). I am also having her start on Rybelsus and methylPREDNISolone. Additionally, I am having her maintain her POTASSIUM CHLORIDE PO, Crisaborole, thiamine, esomeprazole, [employer] Zinc Gluconate, DULoxetine, triamterene-hydrochlorothiazide, and Vitamin D (Ergocalciferol).  Meds ordered this encounter  Medications  . Semaglutide (RYBELSUS) 3 MG TABS    Sig: Take 1 tablet by mouth daily.    Dispense:  30 tablet    Refill:  0  . methylPREDNISolone (MEDROL DOSEPAK) 4 MG TBPK tablet    Sig: TAKE AS DIRECTED    Dispense:  21 tablet    Refill:  0   I spent 50 minutes in preparing to see the patient by review of recent labs, imaging and procedures, obtaining and reviewing separately obtained history, communicating with the patient and family or caregiver, ordering medications, tests or procedures, and documenting clinical information in the EHR including the differential Dx, treatment, and any further evaluation and other management of 1. Essential hypertension. 2. Prediabetes 3. Acute left-sided low back pain without sciatica 4. Thiamine deficiency 5. Zinc deficiency     Follow-up: Return in about 3 weeks (around 12/06/2019).  [name], MD

## 2019-11-16 ENCOUNTER — Other Ambulatory Visit (INDEPENDENT_AMBULATORY_CARE_PROVIDER_SITE_OTHER): Payer: BC Managed Care – PPO

## 2019-11-16 DIAGNOSIS — I10 Essential (primary) hypertension: Secondary | ICD-10-CM | POA: Diagnosis not present

## 2019-11-16 DIAGNOSIS — E519 Thiamine deficiency, unspecified: Secondary | ICD-10-CM | POA: Diagnosis not present

## 2019-11-16 DIAGNOSIS — R7303 Prediabetes: Secondary | ICD-10-CM | POA: Diagnosis not present

## 2019-11-16 DIAGNOSIS — E60 Dietary zinc deficiency: Secondary | ICD-10-CM | POA: Diagnosis not present

## 2019-11-16 LAB — BASIC METABOLIC PANEL
BUN: 13 mg/dL (ref 6–23)
CO2: 30 mEq/L (ref 19–32)
Calcium: 9.1 mg/dL (ref 8.4–10.5)
Chloride: 100 mEq/L (ref 96–112)
Creatinine, Ser: 0.79 mg/dL (ref 0.40–1.20)
GFR: 79.19 mL/min (ref >60.00)
Glucose, Bld: 85 mg/dL (ref 70–99)
Potassium: 3.5 mEq/L (ref 3.5–5.1)
Sodium: 138 mEq/L (ref 135–145)

## 2019-11-16 LAB — CBC WITH DIFFERENTIAL/PLATELET
Basophils Absolute: 0 10*3/uL (ref 0.0–0.1)
Basophils Relative: 0.5 % (ref 0.0–3.0)
Eosinophils Absolute: 0.3 10*3/uL (ref 0.0–0.7)
Eosinophils Relative: 3.8 % (ref 0.0–5.0)
HCT: 37.6 % (ref 36.0–46.0)
Hemoglobin: 12.3 g/dL (ref 12.0–15.0)
Lymphocytes Relative: 21.3 % (ref 12.0–46.0)
Lymphs Abs: 1.6 10*3/uL (ref 0.7–4.0)
MCHC: 32.7 g/dL (ref 30.0–36.0)
MCV: 87.2 fl (ref 78.0–100.0)
Monocytes Absolute: 0.5 10*3/uL (ref 0.1–1.0)
Monocytes Relative: 6.5 % (ref 3.0–12.0)
Neutro Abs: 5 10*3/uL (ref 1.4–7.7)
Neutrophils Relative %: 67.9 % (ref 43.0–77.0)
Platelets: 278 10*3/uL (ref 150.0–400.0)
RBC: 4.3 Mil/uL (ref 3.87–5.11)
RDW: 15.7 % — ABNORMAL HIGH (ref 11.5–15.5)
WBC: 7.3 10*3/uL (ref 4.0–10.5)

## 2019-11-16 LAB — HEMOGLOBIN A1C: Hgb A1c MFr Bld: 5.9 % (ref 4.6–6.5)

## 2019-11-18 DIAGNOSIS — G4733 Obstructive sleep apnea (adult) (pediatric): Secondary | ICD-10-CM | POA: Diagnosis not present

## 2019-11-21 LAB — VITAMIN B1: Vitamin B1 (Thiamine): 17 nmol/L (ref 8–30)

## 2019-11-21 LAB — ZINC: Zinc: 102 ug/dL (ref 60–130)

## 2019-12-19 DIAGNOSIS — Z130 Encounter for screening for diseases of the blood and blood-forming organs and certain disorders involving the immune mechanism: Secondary | ICD-10-CM | POA: Diagnosis not present

## 2019-12-19 DIAGNOSIS — Z1389 Encounter for screening for other disorder: Secondary | ICD-10-CM | POA: Diagnosis not present

## 2019-12-19 DIAGNOSIS — E669 Obesity, unspecified: Secondary | ICD-10-CM | POA: Diagnosis not present

## 2019-12-19 DIAGNOSIS — Z01419 Encounter for gynecological examination (general) (routine) without abnormal findings: Secondary | ICD-10-CM | POA: Diagnosis not present

## 2019-12-20 ENCOUNTER — Other Ambulatory Visit: Payer: Self-pay | Admitting: Internal Medicine

## 2019-12-20 DIAGNOSIS — R7303 Prediabetes: Secondary | ICD-10-CM

## 2019-12-20 MED ORDER — RYBELSUS 7 MG PO TABS: 1.0000 | ORAL_TABLET | Freq: Every day | ORAL | 1 refills | Status: DC

## 2019-12-23 ENCOUNTER — Other Ambulatory Visit: Payer: Self-pay | Admitting: Internal Medicine

## 2019-12-23 DIAGNOSIS — R7303 Prediabetes: Secondary | ICD-10-CM

## 2019-12-23 MED ORDER — METFORMIN HCL ER 750 MG PO TB24: 750.0000 mg | ORAL_TABLET | Freq: Every day | ORAL | 1 refills | Status: DC

## 2019-12-24 DIAGNOSIS — M545 Low back pain, unspecified: Secondary | ICD-10-CM | POA: Diagnosis not present

## 2020-01-12 ENCOUNTER — Encounter: Payer: Self-pay | Admitting: Internal Medicine

## 2020-01-13 ENCOUNTER — Other Ambulatory Visit: Payer: Self-pay | Admitting: Internal Medicine

## 2020-01-13 DIAGNOSIS — R110 Nausea: Secondary | ICD-10-CM | POA: Insufficient documentation

## 2020-01-16 DIAGNOSIS — K5904 Chronic idiopathic constipation: Secondary | ICD-10-CM | POA: Diagnosis not present

## 2020-01-16 DIAGNOSIS — R1033 Periumbilical pain: Secondary | ICD-10-CM | POA: Diagnosis not present

## 2020-01-18 LAB — HM MAMMOGRAPHY: HM Mammogram: NORMAL (ref 0–4)

## 2020-01-20 DIAGNOSIS — R1900 Intra-abdominal and pelvic swelling, mass and lump, unspecified site: Secondary | ICD-10-CM | POA: Diagnosis not present

## 2020-01-20 DIAGNOSIS — Z1231 Encounter for screening mammogram for malignant neoplasm of breast: Secondary | ICD-10-CM | POA: Diagnosis not present

## 2020-01-26 DIAGNOSIS — K5904 Chronic idiopathic constipation: Secondary | ICD-10-CM | POA: Diagnosis not present

## 2020-01-26 DIAGNOSIS — K644 Residual hemorrhoidal skin tags: Secondary | ICD-10-CM | POA: Diagnosis not present

## 2020-01-26 DIAGNOSIS — K219 Gastro-esophageal reflux disease without esophagitis: Secondary | ICD-10-CM | POA: Diagnosis not present

## 2020-01-26 DIAGNOSIS — K5730 Diverticulosis of large intestine without perforation or abscess without bleeding: Secondary | ICD-10-CM | POA: Diagnosis not present

## 2020-02-01 ENCOUNTER — Other Ambulatory Visit: Payer: Self-pay | Admitting: Internal Medicine

## 2020-02-01 DIAGNOSIS — E559 Vitamin D deficiency, unspecified: Secondary | ICD-10-CM

## 2020-02-02 ENCOUNTER — Ambulatory Visit (HOSPITAL_COMMUNITY): Admission: RE | Admit: 2020-02-02 | Payer: BC Managed Care – PPO | Source: Ambulatory Visit

## 2020-02-07 ENCOUNTER — Other Ambulatory Visit: Payer: Self-pay | Admitting: Internal Medicine

## 2020-02-07 DIAGNOSIS — I10 Essential (primary) hypertension: Secondary | ICD-10-CM

## 2020-02-14 ENCOUNTER — Other Ambulatory Visit: Payer: Self-pay | Admitting: Internal Medicine

## 2020-02-14 DIAGNOSIS — F418 Other specified anxiety disorders: Secondary | ICD-10-CM

## 2020-02-17 ENCOUNTER — Other Ambulatory Visit: Payer: Self-pay | Admitting: Internal Medicine

## 2020-02-17 DIAGNOSIS — K2100 Gastro-esophageal reflux disease with esophagitis, without bleeding: Secondary | ICD-10-CM

## 2020-02-22 DIAGNOSIS — K5730 Diverticulosis of large intestine without perforation or abscess without bleeding: Secondary | ICD-10-CM | POA: Diagnosis not present

## 2020-02-22 DIAGNOSIS — Z1211 Encounter for screening for malignant neoplasm of colon: Secondary | ICD-10-CM | POA: Diagnosis not present

## 2020-02-24 DIAGNOSIS — G4733 Obstructive sleep apnea (adult) (pediatric): Secondary | ICD-10-CM | POA: Diagnosis not present

## 2020-02-27 ENCOUNTER — Ambulatory Visit (HOSPITAL_COMMUNITY): Admission: RE | Admit: 2020-02-27 | Payer: BC Managed Care – PPO | Source: Ambulatory Visit

## 2020-02-29 LAB — HM COLONOSCOPY

## 2020-04-16 ENCOUNTER — Ambulatory Visit (HOSPITAL_COMMUNITY): Payer: No Typology Code available for payment source

## 2020-05-09 ENCOUNTER — Other Ambulatory Visit: Payer: Self-pay | Admitting: Internal Medicine

## 2020-05-09 DIAGNOSIS — E559 Vitamin D deficiency, unspecified: Secondary | ICD-10-CM

## 2020-05-09 DIAGNOSIS — I10 Essential (primary) hypertension: Secondary | ICD-10-CM

## 2020-05-10 MED ORDER — VITAMIN D (ERGOCALCIFEROL) 1.25 MG (50000 UNIT) PO CAPS: 50000.0000 [IU] | ORAL_CAPSULE | ORAL | 0 refills | Status: DC

## 2020-05-10 MED ORDER — TRIAMTERENE-HCTZ 37.5-25 MG PO TABS: 1.0000 | ORAL_TABLET | Freq: Every day | ORAL | 0 refills | Status: DC

## 2020-07-31 ENCOUNTER — Ambulatory Visit: Payer: No Typology Code available for payment source | Admitting: Internal Medicine

## 2020-07-31 ENCOUNTER — Other Ambulatory Visit: Payer: Self-pay | Admitting: Internal Medicine

## 2020-07-31 DIAGNOSIS — E559 Vitamin D deficiency, unspecified: Secondary | ICD-10-CM

## 2020-08-07 ENCOUNTER — Other Ambulatory Visit: Payer: Self-pay | Admitting: Internal Medicine

## 2020-08-07 DIAGNOSIS — K2100 Gastro-esophageal reflux disease with esophagitis, without bleeding: Secondary | ICD-10-CM

## 2020-08-07 DIAGNOSIS — F418 Other specified anxiety disorders: Secondary | ICD-10-CM

## 2020-08-08 ENCOUNTER — Other Ambulatory Visit: Payer: Self-pay | Admitting: Internal Medicine

## 2020-08-08 DIAGNOSIS — I10 Essential (primary) hypertension: Secondary | ICD-10-CM

## 2020-08-22 ENCOUNTER — Ambulatory Visit: Payer: No Typology Code available for payment source | Admitting: Internal Medicine

## 2020-08-28 ENCOUNTER — Encounter: Payer: Self-pay | Admitting: Internal Medicine

## 2020-08-28 ENCOUNTER — Other Ambulatory Visit: Payer: Self-pay

## 2020-08-28 ENCOUNTER — Ambulatory Visit (INDEPENDENT_AMBULATORY_CARE_PROVIDER_SITE_OTHER): Payer: 59 | Admitting: Internal Medicine

## 2020-08-28 VITALS — BP 138/76 | HR 94 | Temp 98.9°F | Ht 63.0 in | Wt 245.0 lb

## 2020-08-28 DIAGNOSIS — E60 Dietary zinc deficiency: Secondary | ICD-10-CM

## 2020-08-28 DIAGNOSIS — E519 Thiamine deficiency, unspecified: Secondary | ICD-10-CM | POA: Diagnosis not present

## 2020-08-28 DIAGNOSIS — E559 Vitamin D deficiency, unspecified: Secondary | ICD-10-CM

## 2020-08-28 DIAGNOSIS — K5904 Chronic idiopathic constipation: Secondary | ICD-10-CM

## 2020-08-28 DIAGNOSIS — R7303 Prediabetes: Secondary | ICD-10-CM

## 2020-08-28 DIAGNOSIS — R69 Illness, unspecified: Secondary | ICD-10-CM | POA: Diagnosis not present

## 2020-08-28 DIAGNOSIS — I10 Essential (primary) hypertension: Secondary | ICD-10-CM | POA: Diagnosis not present

## 2020-08-28 DIAGNOSIS — D510 Vitamin B12 deficiency anemia due to intrinsic factor deficiency: Secondary | ICD-10-CM | POA: Diagnosis not present

## 2020-08-28 DIAGNOSIS — E785 Hyperlipidemia, unspecified: Secondary | ICD-10-CM

## 2020-08-28 DIAGNOSIS — F331 Major depressive disorder, recurrent, moderate: Secondary | ICD-10-CM

## 2020-08-28 DIAGNOSIS — Z0000 Encounter for general adult medical examination without abnormal findings: Secondary | ICD-10-CM | POA: Diagnosis not present

## 2020-08-28 MED ORDER — LINZESS 290 MCG PO CAPS: 290.0000 ug | ORAL_CAPSULE | Freq: Every morning | ORAL | 1 refills | Status: DC

## 2020-08-28 MED ORDER — BUPROPION HCL ER (XL) 150 MG PO TB24
150.0000 mg | ORAL_TABLET | Freq: Every day | ORAL | 0 refills | Status: DC
Start: 2020-08-28 — End: 2020-11-24

## 2020-08-28 NOTE — Progress Notes (Signed)
Subjective:  Patient ID: [name], female    DOB: 1955/03/12  Age: 65 y.o. MRN: [id number]  CC: Annual Exam and Hypertension  This visit occurred during the SARS-CoV-2 public health emergency.  Safety protocols were in place, including screening questions prior to the visit, additional usage of staff PPE, and extensive cleaning of exam room while observing appropriate contact time as indicated for disinfecting solutions.    HPI [name] presents for a CPX and f/up -   She complains of signs and symptoms of depression.  She is compliant with duloxetine.  Is overwhelmed with work and taking care of her mother.  She reports apathy, anhedonia, procrastination, fatigue, and nonrestorative sleep.  She denies SI or HI.  She complains of severe constipation and has to take a high dose of Linzess to control the symptoms.  She is not very active but when she does move around she does not experience chest pain, shortness of breath, diaphoresis, dizziness, lightheadedness, or edema.  Outpatient Medications Prior to Visit  Medication Sig Dispense Refill   [facility] ZINC GLUCONATE 50 MG tablet TAKE 1 TABLET (50 MG TOTAL) BY MOUTH EVERY OTHER DAY. 45 tablet 1   DULoxetine (CYMBALTA) 60 MG capsule TAKE 1 CAPSULE BY MOUTH EVERY DAY 90 capsule 1   esomeprazole (NEXIUM) 40 MG capsule TAKE 1 CAPSULE (40 MG TOTAL) BY MOUTH DAILY AT 12 NOON. 90 capsule 1   POTASSIUM CHLORIDE PO Take by mouth.     thiamine 100 MG tablet Take 1 tablet (100 mg total) by mouth daily. 90 tablet 1   triamterene-hydrochlorothiazide (MAXZIDE-25) 37.5-25 MG tablet TAKE 1 TABLET BY MOUTH EVERY DAY 90 tablet 0   Vitamin D, Ergocalciferol, (DRISDOL) 1.25 MG (50000 UNIT) CAPS capsule TAKE 1 CAPSULE (50,000 UNITS TOTAL) BY MOUTH EVERY 7 (SEVEN) DAYS 12 capsule 0   LINZESS 290 MCG CAPS capsule Take 290 mcg by mouth every morning.     Crisaborole (EUCRISA) 2 % OINT Apply 1 Act topically 2 (two) times daily. (Patient not taking: Reported on  08/28/2020) 60 g 5   metFORMIN (GLUCOPHAGE XR) 750 MG 24 hr tablet Take 1 tablet (750 mg total) by mouth daily with breakfast. (Patient not taking: Reported on 08/28/2020) 90 tablet 1   No facility-administered medications prior to visit.    ROS Review of Systems  Constitutional:  Positive for fatigue. Negative for diaphoresis and unexpected weight change.  HENT: Negative.  Negative for trouble swallowing.   Eyes: Negative.   Cardiovascular:  Negative for chest pain, palpitations and leg swelling.  Gastrointestinal:  Positive for constipation. Negative for abdominal pain, diarrhea, nausea and vomiting.  Endocrine: Negative.   Genitourinary: Negative.  Negative for difficulty urinating and dysuria.  Musculoskeletal: Negative.  Negative for myalgias.  Skin: Negative.   Neurological: Negative.  Negative for dizziness, weakness and light-headedness.  Hematological:  Negative for adenopathy. Does not bruise/bleed easily.  Psychiatric/Behavioral:  Positive for decreased concentration, dysphoric mood and sleep disturbance. Negative for behavioral problems, confusion and suicidal ideas. The patient is not nervous/anxious.    Objective:  BP 138/76 (BP Location: Left Arm, Patient Position: Sitting, Cuff Size: Large)   Pulse 94   Temp 98.9 F (37.2 C) (Oral)   Ht 5\' 3"  (1.6 m)   Wt 245 lb (111.1 kg)   SpO2 97%   BMI 43.40 kg/m   BP Readings from Last 3 Encounters:  08/28/20 138/76  11/15/19 132/82  08/15/19 136/72    Wt Readings from Last 3 Encounters:  08/28/20 245 lb (111.1 kg)  11/15/19 250 lb (113.4 kg)  08/15/19 249 lb 6.4 oz (113.1 kg)    Physical Exam Vitals reviewed.  Constitutional:      Appearance: She is obese. She is ill-appearing.  HENT:     Nose: Nose normal.     Mouth/Throat:     Mouth: Mucous membranes are moist.  Eyes:     Conjunctiva/sclera: Conjunctivae normal.  Cardiovascular:     Rate and Rhythm: Normal rate and regular rhythm.     Heart sounds: Normal  heart sounds, S1 normal and S2 normal.    No gallop.     Comments: EKG- NSR, 76 bpm ST/T ([facility])  wave changes in inf/lat leads Unchanged Pulmonary:     Effort: Pulmonary effort is normal.     Breath sounds: No stridor. No wheezing, rhonchi or rales.  Abdominal:     General: Abdomen is protuberant. Bowel sounds are normal. There is no distension.     Palpations: Abdomen is soft. There is no hepatomegaly, splenomegaly or mass.     Tenderness: There is no abdominal tenderness.  Musculoskeletal:     Cervical back: Neck supple.     Right lower leg: No edema.     Left lower leg: No edema.  Lymphadenopathy:     Cervical: No cervical adenopathy.  Neurological:     General: No focal deficit present.     Mental Status: She is alert.  Psychiatric:        Attention and Perception: Attention normal.        Mood and Affect: Mood is depressed. Affect is tearful.        Speech: Speech is not rapid and pressured, delayed, slurred or tangential.        Behavior: Behavior normal. Behavior is cooperative.        Thought Content: Thought content normal.        Cognition and Memory: Cognition normal.    Lab Results  Component Value Date   WBC 7.2 08/29/2020   HGB 11.9 (L) 08/29/2020   HCT 37.0 08/29/2020   PLT 315.0 08/29/2020   GLUCOSE 85 08/29/2020   CHOL 181 08/29/2020   TRIG 118.0 08/29/2020   HDL 53.20 08/29/2020   LDLCALC 104 (H) 08/29/2020   ALT 17 08/29/2020   AST 17 08/29/2020   NA 141 08/29/2020   K 3.7 08/29/2020   CL 103 08/29/2020   CREATININE 0.94 08/29/2020   BUN 18 08/29/2020   CO2 28 08/29/2020   TSH 1.97 08/29/2020   HGBA1C 5.8 08/29/2020    CT ABDOMEN PELVIS W CONTRAST  Result Date: 07/14/2019 CLINICAL DATA:  Abdominal pain and nausea EXAM: CT ABDOMEN AND PELVIS WITH CONTRAST TECHNIQUE: Multidetector CT imaging of the abdomen and pelvis was performed using the standard protocol following bolus administration of intravenous contrast. CONTRAST:  OMNIPAQUE  IOHEXOL 300 MG/ML  SOLN COMPARISON:  02/20/2015 FINDINGS: LOWER CHEST: Normal. HEPATOBILIARY: Normal hepatic contours. No intra- or extrahepatic biliary dilatation. Status post cholecystectomy. PANCREAS: Normal pancreas. No ductal dilatation or peripancreatic fluid collection. SPLEEN: Normal. ADRENALS/URINARY TRACT: The adrenal glands are normal. No hydronephrosis, nephroureterolithiasis or solid renal mass. The urinary bladder is normal for degree of distention STOMACH/BOWEL: There is no hiatal hernia. Normal duodenal course and caliber. No small bowel dilatation or inflammation. There is sigmoid and distal descending colon diverticulosis with an area acute inflammation extending from the distal descending colon into the proximal sigmoid colon. There is no fluid collection or free intraperitoneal  air. The appendix is not visualized. No right lower quadrant inflammation or free fluid. VASCULAR/LYMPHATIC: There is calcific atherosclerosis of the abdominal aorta. No lymphadenopathy. REPRODUCTIVE: Normal prostate size with symmetric seminal vesicles. MUSCULOSKELETAL. No bony spinal canal stenosis or focal osseous abnormality. OTHER: None. IMPRESSION: 1. Acute diverticulitis of the distal descending colon and proximal sigmoid colon without abscess or free intraperitoneal air. 2. Aortic Atherosclerosis (ICD10-I70.0). Electronically Signed   By: Deatra Robinson M.D.   On: 07/14/2019 01:15    Assessment & Plan:   Jessamy was seen today for annual exam and hypertension.  Diagnoses and all orders for this visit:  Essential hypertension- Her blood pressure is adequately well controlled.  Her EKG is reassuring. -     Basic metabolic panel; Future -     Hepatic function panel; Future -     TSH; Future -     Urinalysis, Routine w reflex microscopic; Future -     EKG 12-Lead  Vitamin B12 deficiency anemia due to intrinsic factor deficiency- I will monitor her B12 level. -     CBC with Differential/Platelet;  Future -     Folate; Future -     Vitamin B12; Future  Thiamine deficiency- I will monitor her thiamine level. -     CBC with Differential/Platelet; Future  Vitamin D deficiency- I will monitor her vitamin D level -     Vitamin B1; Future -     VITAMIN D 25 Hydroxy (Vit-D Deficiency, Fractures); Future  Zinc deficiency- I will monitor her zinc level -     Zinc; Future  Routine general medical examination at a health care facility- Exam completed, labs reviewed, vaccines reviewed and updated, cancer screenings are up-to-date, patient education was given. -     Lipid panel; Future  Prediabetes- Her blood sugar is adequately well controlled. -     Hemoglobin A1c; Future  Chronic idiopathic constipation -     LINZESS 290 MCG CAPS capsule; Take 1 capsule (290 mcg total) by mouth every morning.  Moderate episode of recurrent major depressive disorder (HCC) -     buPROPion (WELLBUTRIN XL) 150 MG 24 hr tablet; Take 1 tablet (150 mg total) by mouth daily.  I have changed Teryl Loughner's Linzess. I am also having her start on buPROPion. Additionally, I am having her maintain her POTASSIUM CHLORIDE PO, Crisaborole, thiamine, [facility] Zinc Gluconate, metFORMIN, Vitamin D (Ergocalciferol), DULoxetine, esomeprazole, and triamterene-hydrochlorothiazide.  Meds ordered this encounter  Medications   LINZESS 290 MCG CAPS capsule    Sig: Take 1 capsule (290 mcg total) by mouth every morning.    Dispense:  90 capsule    Refill:  1   buPROPion (WELLBUTRIN XL) 150 MG 24 hr tablet    Sig: Take 1 tablet (150 mg total) by mouth daily.    Dispense:  90 tablet    Refill:  0      Follow-up: Return in about 3 months (around 11/28/2020).  [name], MD

## 2020-08-28 NOTE — Patient Instructions (Signed)
Health Maintenance, Female Adopting a healthy lifestyle and getting preventive care are important in promoting health and wellness. Ask your health care provider about: The right schedule for you to have regular tests and exams. Things you can do on your own to prevent diseases and keep yourself healthy. What should I know about diet, weight, and exercise? Eat a healthy diet  Eat a diet that includes plenty of vegetables, fruits, low-fat dairy products, and lean protein. Do not eat a lot of foods that are high in solid fats, added sugars, or sodium.  Maintain a healthy weight Body mass index (BMI) is used to identify weight problems. It estimates body fat based on height and weight. Your health care provider can help determineyour BMI and help you achieve or maintain a healthy weight. Get regular exercise Get regular exercise. This is one of the most important things you can do for your health. Most adults should: Exercise for at least 150 minutes each week. The exercise should increase your heart rate and make you sweat (moderate-intensity exercise). Do strengthening exercises at least twice a week. This is in addition to the moderate-intensity exercise. Spend less time sitting. Even light physical activity can be beneficial. Watch cholesterol and blood lipids Have your blood tested for lipids and cholesterol at 65 years of age, then havethis test every 5 years. Have your cholesterol levels checked more often if: Your lipid or cholesterol levels are high. You are older than 65 years of age. You are at high risk for heart disease. What should I know about cancer screening? Depending on your health history and family history, you may need to have cancer screening at various ages. This may include screening for: Breast cancer. Cervical cancer. Colorectal cancer. Skin cancer. Lung cancer. What should I know about heart disease, diabetes, and high blood pressure? Blood pressure and heart  disease High blood pressure causes heart disease and increases the risk of stroke. This is more likely to develop in people who have high blood pressure readings, are of African descent, or are overweight. Have your blood pressure checked: Every 3-5 years if you are 18-39 years of age. Every year if you are 40 years old or older. Diabetes Have regular diabetes screenings. This checks your fasting blood sugar level. Have the screening done: Once every three years after age 40 if you are at a normal weight and have a low risk for diabetes. More often and at a younger age if you are overweight or have a high risk for diabetes. What should I know about preventing infection? Hepatitis B If you have a higher risk for hepatitis B, you should be screened for this virus. Talk with your health care provider to find out if you are at risk forhepatitis B infection. Hepatitis C Testing is recommended for: Everyone born from 1945 through 1965. Anyone with known risk factors for hepatitis C. Sexually transmitted infections (STIs) Get screened for STIs, including gonorrhea and chlamydia, if: You are sexually active and are younger than 65 years of age. You are older than 65 years of age and your health care provider tells you that you are at risk for this type of infection. Your sexual activity has changed since you were last screened, and you are at increased risk for chlamydia or gonorrhea. Ask your health care provider if you are at risk. Ask your health care provider about whether you are at high risk for HIV. Your health care provider may recommend a prescription medicine to help   prevent HIV infection. If you choose to take medicine to prevent HIV, you should first get tested for HIV. You should then be tested every 3 months for as long as you are taking the medicine. Pregnancy If you are about to stop having your period (premenopausal) and you may become pregnant, seek counseling before you get  pregnant. Take 400 to 800 micrograms (mcg) of folic acid every day if you become pregnant. Ask for birth control (contraception) if you want to prevent pregnancy. Osteoporosis and menopause Osteoporosis is a disease in which the bones lose minerals and strength with aging. This can result in bone fractures. If you are 65 years old or older, or if you are at risk for osteoporosis and fractures, ask your health care provider if you should: Be screened for bone loss. Take a calcium or vitamin D supplement to lower your risk of fractures. Be given hormone replacement therapy (HRT) to treat symptoms of menopause. Follow these instructions at home: Lifestyle Do not use any products that contain nicotine or tobacco, such as cigarettes, e-cigarettes, and chewing tobacco. If you need help quitting, ask your health care provider. Do not use street drugs. Do not share needles. Ask your health care provider for help if you need support or information about quitting drugs. Alcohol use Do not drink alcohol if: Your health care provider tells you not to drink. You are pregnant, may be pregnant, or are planning to become pregnant. If you drink alcohol: Limit how much you use to 0-1 drink a day. Limit intake if you are breastfeeding. Be aware of how much alcohol is in your drink. In the [place] one drink equals one 12 oz bottle of beer (355 mL), one 5 oz glass of wine (148 mL), or one 1 oz glass of hard liquor (44 mL). General instructions Schedule regular health, dental, and eye exams. Stay current with your vaccines. Tell your health care provider if: You often feel depressed. You have ever been abused or do not feel safe at home. Summary Adopting a healthy lifestyle and getting preventive care are important in promoting health and wellness. Follow your health care provider's instructions about healthy diet, exercising, and getting tested or screened for diseases. Follow your health care provider's  instructions on monitoring your cholesterol and blood pressure. This information is not intended to replace advice given to you by your health care provider. Make sure you discuss any questions you have with your healthcare provider. Document Revised: 12/16/2017 Document Reviewed: 12/16/2017 Elsevier Patient Education  2022 [employer]  

## 2020-08-29 ENCOUNTER — Other Ambulatory Visit (INDEPENDENT_AMBULATORY_CARE_PROVIDER_SITE_OTHER): Payer: 59

## 2020-08-29 DIAGNOSIS — D510 Vitamin B12 deficiency anemia due to intrinsic factor deficiency: Secondary | ICD-10-CM

## 2020-08-29 DIAGNOSIS — E519 Thiamine deficiency, unspecified: Secondary | ICD-10-CM

## 2020-08-29 DIAGNOSIS — R7303 Prediabetes: Secondary | ICD-10-CM

## 2020-08-29 DIAGNOSIS — E60 Dietary zinc deficiency: Secondary | ICD-10-CM

## 2020-08-29 DIAGNOSIS — E559 Vitamin D deficiency, unspecified: Secondary | ICD-10-CM | POA: Diagnosis not present

## 2020-08-29 DIAGNOSIS — Z0000 Encounter for general adult medical examination without abnormal findings: Secondary | ICD-10-CM

## 2020-08-29 DIAGNOSIS — I10 Essential (primary) hypertension: Secondary | ICD-10-CM

## 2020-08-29 LAB — URINALYSIS, ROUTINE W REFLEX MICROSCOPIC
Bilirubin Urine: NEGATIVE
Hgb urine dipstick: NEGATIVE
Ketones, ur: NEGATIVE
Leukocytes,Ua: NEGATIVE
Nitrite: NEGATIVE
Specific Gravity, Urine: 1.025 (ref 1.000–1.030)
Total Protein, Urine: NEGATIVE
Urine Glucose: NEGATIVE
Urobilinogen, UA: 0.2 (ref 0.0–1.0)
pH: 6 (ref 5.0–8.0)

## 2020-08-29 LAB — LIPID PANEL
Cholesterol: 181 mg/dL (ref 0–200)
HDL: 53.2 mg/dL (ref >39.00)
LDL Cholesterol: 104 mg/dL — ABNORMAL HIGH (ref 0–99)
NonHDL: 127.3
Total CHOL/HDL Ratio: 3
Triglycerides: 118 mg/dL (ref 0.0–149.0)
VLDL: 23.6 mg/dL (ref 0.0–40.0)

## 2020-08-29 LAB — BASIC METABOLIC PANEL
BUN: 18 mg/dL (ref 6–23)
CO2: 28 mEq/L (ref 19–32)
Calcium: 9.6 mg/dL (ref 8.4–10.5)
Chloride: 103 mEq/L (ref 96–112)
Creatinine, Ser: 0.94 mg/dL (ref 0.40–1.20)
GFR: 63.92 mL/min (ref >60.00)
Glucose, Bld: 85 mg/dL (ref 70–99)
Potassium: 3.7 mEq/L (ref 3.5–5.1)
Sodium: 141 mEq/L (ref 135–145)

## 2020-08-29 LAB — HEPATIC FUNCTION PANEL
ALT: 17 U/L (ref 0–35)
AST: 17 U/L (ref 0–37)
Albumin: 4 g/dL (ref 3.5–5.2)
Alkaline Phosphatase: 106 U/L (ref 39–117)
Bilirubin, Direct: 0.1 mg/dL (ref 0.0–0.3)
Total Bilirubin: 0.3 mg/dL (ref 0.2–1.2)
Total Protein: 7.7 g/dL (ref 6.0–8.3)

## 2020-08-29 LAB — CBC WITH DIFFERENTIAL/PLATELET
Basophils Absolute: 0.1 10*3/uL (ref 0.0–0.1)
Basophils Relative: 0.7 % (ref 0.0–3.0)
Eosinophils Absolute: 0.2 10*3/uL (ref 0.0–0.7)
Eosinophils Relative: 3.2 % (ref 0.0–5.0)
HCT: 37 % (ref 36.0–46.0)
Hemoglobin: 11.9 g/dL — ABNORMAL LOW (ref 12.0–15.0)
Lymphocytes Relative: 29.7 % (ref 12.0–46.0)
Lymphs Abs: 2.1 10*3/uL (ref 0.7–4.0)
MCHC: 32.1 g/dL (ref 30.0–36.0)
MCV: 87.1 fl (ref 78.0–100.0)
Monocytes Absolute: 0.5 10*3/uL (ref 0.1–1.0)
Monocytes Relative: 7 % (ref 3.0–12.0)
Neutro Abs: 4.3 10*3/uL (ref 1.4–7.7)
Neutrophils Relative %: 59.4 % (ref 43.0–77.0)
Platelets: 315 10*3/uL (ref 150.0–400.0)
RBC: 4.24 Mil/uL (ref 3.87–5.11)
RDW: 15 % (ref 11.5–15.5)
WBC: 7.2 10*3/uL (ref 4.0–10.5)

## 2020-08-29 LAB — HEMOGLOBIN A1C: Hgb A1c MFr Bld: 5.8 % (ref 4.6–6.5)

## 2020-08-29 LAB — VITAMIN D 25 HYDROXY (VIT D DEFICIENCY, FRACTURES): VITD: 32.06 ng/mL (ref 30.00–100.00)

## 2020-08-29 LAB — VITAMIN B12: Vitamin B-12: 338 pg/mL (ref 211–911)

## 2020-08-29 LAB — FOLATE: Folate: 6.6 ng/mL (ref >5.9)

## 2020-08-29 LAB — TSH: TSH: 1.97 u[IU]/mL (ref 0.35–5.50)

## 2020-09-01 ENCOUNTER — Other Ambulatory Visit: Payer: Self-pay | Admitting: Internal Medicine

## 2020-09-01 DIAGNOSIS — E60 Dietary zinc deficiency: Secondary | ICD-10-CM

## 2020-09-01 DIAGNOSIS — Z9884 Bariatric surgery status: Secondary | ICD-10-CM

## 2020-09-01 LAB — VITAMIN B1: Vitamin B1 (Thiamine): 21 nmol/L (ref 8–30)

## 2020-09-01 LAB — ZINC: Zinc: 59 ug/dL — ABNORMAL LOW (ref 60–130)

## 2020-09-01 MED ORDER — ZINC GLUCONATE 50 MG PO TABS: 50.0000 mg | ORAL_TABLET | ORAL | 1 refills | Status: DC

## 2020-10-22 ENCOUNTER — Other Ambulatory Visit: Payer: Self-pay | Admitting: Internal Medicine

## 2020-10-22 DIAGNOSIS — E559 Vitamin D deficiency, unspecified: Secondary | ICD-10-CM

## 2020-11-10 ENCOUNTER — Other Ambulatory Visit: Payer: Self-pay | Admitting: Internal Medicine

## 2020-11-10 DIAGNOSIS — I10 Essential (primary) hypertension: Secondary | ICD-10-CM

## 2020-11-24 ENCOUNTER — Other Ambulatory Visit: Payer: Self-pay | Admitting: Internal Medicine

## 2020-11-24 DIAGNOSIS — F331 Major depressive disorder, recurrent, moderate: Secondary | ICD-10-CM

## 2020-11-28 ENCOUNTER — Ambulatory Visit: Payer: 59 | Admitting: Internal Medicine

## 2021-01-03 ENCOUNTER — Encounter: Payer: Self-pay | Admitting: Internal Medicine

## 2021-01-03 ENCOUNTER — Other Ambulatory Visit: Payer: Self-pay

## 2021-01-03 ENCOUNTER — Ambulatory Visit (INDEPENDENT_AMBULATORY_CARE_PROVIDER_SITE_OTHER): Payer: 59 | Admitting: Internal Medicine

## 2021-01-03 VITALS — BP 128/82 | HR 93 | Temp 98.6°F | Ht 63.0 in | Wt 240.0 lb

## 2021-01-03 DIAGNOSIS — E876 Hypokalemia: Secondary | ICD-10-CM | POA: Diagnosis not present

## 2021-01-03 DIAGNOSIS — E519 Thiamine deficiency, unspecified: Secondary | ICD-10-CM | POA: Diagnosis not present

## 2021-01-03 DIAGNOSIS — E60 Dietary zinc deficiency: Secondary | ICD-10-CM

## 2021-01-03 DIAGNOSIS — Z9884 Bariatric surgery status: Secondary | ICD-10-CM

## 2021-01-03 DIAGNOSIS — D510 Vitamin B12 deficiency anemia due to intrinsic factor deficiency: Secondary | ICD-10-CM | POA: Diagnosis not present

## 2021-01-03 DIAGNOSIS — I10 Essential (primary) hypertension: Secondary | ICD-10-CM

## 2021-01-03 DIAGNOSIS — M545 Low back pain, unspecified: Secondary | ICD-10-CM | POA: Diagnosis not present

## 2021-01-03 DIAGNOSIS — R69 Illness, unspecified: Secondary | ICD-10-CM | POA: Diagnosis not present

## 2021-01-03 DIAGNOSIS — H8303 Labyrinthitis, bilateral: Secondary | ICD-10-CM | POA: Diagnosis not present

## 2021-01-03 DIAGNOSIS — F331 Major depressive disorder, recurrent, moderate: Secondary | ICD-10-CM

## 2021-01-03 LAB — CBC WITH DIFFERENTIAL/PLATELET
Basophils Absolute: 0 10*3/uL (ref 0.0–0.1)
Basophils Relative: 0.5 % (ref 0.0–3.0)
Eosinophils Absolute: 0.1 10*3/uL (ref 0.0–0.7)
Eosinophils Relative: 1.7 % (ref 0.0–5.0)
HCT: 35.6 % — ABNORMAL LOW (ref 36.0–46.0)
Hemoglobin: 11.6 g/dL — ABNORMAL LOW (ref 12.0–15.0)
Lymphocytes Relative: 22.5 % (ref 12.0–46.0)
Lymphs Abs: 1.8 10*3/uL (ref 0.7–4.0)
MCHC: 32.5 g/dL (ref 30.0–36.0)
MCV: 86.4 fl (ref 78.0–100.0)
Monocytes Absolute: 0.6 10*3/uL (ref 0.1–1.0)
Monocytes Relative: 6.8 % (ref 3.0–12.0)
Neutro Abs: 5.6 10*3/uL (ref 1.4–7.7)
Neutrophils Relative %: 68.5 % (ref 43.0–77.0)
Platelets: 343 10*3/uL (ref 150.0–400.0)
RBC: 4.12 Mil/uL (ref 3.87–5.11)
RDW: 15.1 % (ref 11.5–15.5)
WBC: 8.1 10*3/uL (ref 4.0–10.5)

## 2021-01-03 LAB — BASIC METABOLIC PANEL
BUN: 10 mg/dL (ref 6–23)
CO2: 30 mEq/L (ref 19–32)
Calcium: 9.2 mg/dL (ref 8.4–10.5)
Chloride: 99 mEq/L (ref 96–112)
Creatinine, Ser: 0.94 mg/dL (ref 0.40–1.20)
GFR: 63.77 mL/min (ref >60.00)
Glucose, Bld: 92 mg/dL (ref 70–99)
Potassium: 3.1 mEq/L — ABNORMAL LOW (ref 3.5–5.1)
Sodium: 138 mEq/L (ref 135–145)

## 2021-01-03 MED ORDER — PREDNISONE 50 MG PO TABS: 50.0000 mg | ORAL_TABLET | Freq: Every day | ORAL | 0 refills | Status: DC

## 2021-01-03 MED ORDER — ARIPIPRAZOLE 2 MG PO TABS: 2.0000 mg | ORAL_TABLET | Freq: Every day | ORAL | 0 refills | Status: DC

## 2021-01-03 MED ORDER — THIAMINE HCL 100 MG PO TABS: 100.0000 mg | ORAL_TABLET | Freq: Every day | ORAL | 1 refills | Status: DC

## 2021-01-03 MED ORDER — POTASSIUM CHLORIDE CRYS ER 15 MEQ PO TBCR: 15.0000 meq | EXTENDED_RELEASE_TABLET | Freq: Two times a day (BID) | ORAL | 0 refills | Status: DC

## 2021-01-03 MED ORDER — ZINC GLUCONATE 50 MG PO TABS: 50.0000 mg | ORAL_TABLET | ORAL | 1 refills | Status: DC

## 2021-01-03 NOTE — Patient Instructions (Signed)
Major Depressive Disorder, Adult °Major depressive disorder (MDD) is a mental health condition. It may also be called clinical depression or unipolar depression. MDD causes symptoms of sadness, hopelessness, and loss of interest in things. These symptoms last most of the day, almost every day, for 2 weeks. MDD can also cause physical symptoms. It can interfere with relationships and with everyday activities, such as work, school, and activities that are usually pleasant. °MDD may be mild, moderate, or severe. It may be single-episode MDD, which happens once, or recurrent MDD, which may occur multiple times. °What are the causes? °The exact cause of this condition is not known. MDD is most likely caused by a combination of things, which may include: °Your personality traits. °Learned or conditioned behaviors or thoughts or feelings that reinforce negativity. °Any alcohol or substance misuse. °Long-term (chronic) physical or mental health illness. °Going through a traumatic experience or major life changes. °What increases the risk? °The following factors may make someone more likely to develop MDD: °A family history of depression. °Being a woman. °Troubled family relationships. °Abnormally low levels of certain brain chemicals. °Traumatic or painful events in childhood, especially abuse or loss of a parent. °A lot of stress from life experiences, such as poor living conditions or discrimination. °Chronic physical illness or other mental health disorders. °What are the signs or symptoms? °The main symptoms of MDD usually include: °Constant depressed or irritable mood. °A loss of interest in things and activities. °Other symptoms include: °Sleeping or eating too much or too little. °Unexplained weight gain or weight loss. °Tiredness or low energy. °Being agitated, restless, or weak. °Feeling hopeless, worthless, or guilty. °Trouble thinking clearly or making decisions. °Thoughts of suicide or thoughts of harming  others. °Isolating oneself or avoiding other people or activities. °Trouble completing tasks, work, or any normal obligations. °Severe symptoms of this condition may include: °Psychotic depression.This may include false beliefs, or delusions. It may also include seeing, hearing, tasting, smelling, or feeling things that are not real (hallucinations). °Chronic depression or persistent depressive disorder. This is low-level depression that lasts for at least 2 years. °Melancholic depression, or feeling extremely sad and hopeless. °Catatonic depression, which includes trouble speaking and trouble moving. °How is this diagnosed? °This condition may be diagnosed based on: °Your symptoms. °Your medical and mental health history. You may be asked questions about your lifestyle, including any drug and alcohol use. °A physical exam. °Blood tests to rule out other conditions. °MDD is confirmed if you have the following symptoms most of the day, nearly every day, in a 2-week period: °Either a depressed mood or loss of interest. °At least four other MDD symptoms. °How is this treated? °This condition is usually treated by mental health professionals, such as psychologists, psychiatrists, and clinical social workers. You may need more than one type of treatment. Treatment may include: °Psychotherapy, also called talk therapy or counseling. Types of psychotherapy include: °Cognitive behavioral therapy (CBT). This teaches you to recognize unhealthy feelings, thoughts, and behaviors, and replace them with positive thoughts and actions. °Interpersonal therapy (IPT). This helps you to improve the way you communicate with others or relate to them. °Family therapy. This treatment includes members of your family. °Medicines to treat anxiety and depression. These medicines help to balance the brain chemicals that affect your emotions. °Lifestyle changes. You may be asked to: °Limit alcohol use and avoid drug use. °Get regular  exercise. °Get plenty of sleep. °Make healthy eating choices. °Spend more time outdoors. °Brain stimulation. This may   be done if symptoms are very severe and other treatments have not worked. Examples of this treatment are electroconvulsive therapy and transcranial magnetic stimulation. °Follow these instructions at home: °Activity °Exercise regularly and spend time outdoors. °Find activities that you enjoy doing, and make time to do them. °Find healthy ways to manage stress, such as: °Meditation or deep breathing. °Spending time in nature. °Journaling. °Return to your normal activities as told by your health care provider. Ask your health care provider what activities are safe for you. °Alcohol and drug use °If you drink alcohol: °Limit how much you use to: °0-1 drink a day for women who are not pregnant. °0-2 drinks a day for men. °Be aware of how much alcohol is in your drink. In the [place] one drink equals one 12 oz bottle of beer (355 mL), one 5 oz glass of wine (148 mL), or one 1½ oz glass of hard liquor (44 mL). °Discuss your alcohol use with your health care provider. Alcohol can affect any antidepressant medicines you are taking. °Discuss any drug use with your health care provider. °General instructions ° °Take over-the-counter and prescription medicines only as told by your health care provider. °Eat a healthy diet and get plenty of sleep. °Consider joining a support group. Your health care provider may be able to recommend one. °Keep all follow-up visits as told by your health care provider. This is important. °Where to find more information °National Alliance on Mental Illness: www.nami.org °[place] National Institute of Mental Health: www.nimh.nih.gov °Contact a health care provider if: °Your symptoms get worse. °You develop new symptoms. °Get help right away if: °You self-harm. °You have serious thoughts about hurting yourself or others. °You hallucinate. °If you ever feel like you may hurt yourself or  others, or have thoughts about taking your own life, get help right away. Go to your nearest emergency department or: °Call your local emergency services (911 in the [place] °Call a suicide crisis helpline, such as the National Suicide Prevention Lifeline at 1-800-273-8255 or 988 in the [place] This is open 24 hours a day in the [place] °Text the Crisis Text Line at 741741 (in the [place] °Summary °Major depressive disorder (MDD) is a mental health condition. MDD causes symptoms of sadness, hopelessness, and loss of interest in things. These symptoms last most of the day, almost every day, for 2 weeks. °The symptoms of MDD can interfere with relationships and with everyday activities. °Treatments and support are available for people who develop MDD. You may need more than one type of treatment. °Get help right away if you have serious thoughts about hurting yourself or others. °This information is not intended to replace advice given to you by your health care provider. Make sure you discuss any questions you have with your health care provider. °Document Revised: 07/18/2020 Document Reviewed: 12/04/2018 °Elsevier Patient Education © 2022 [employer] ° °

## 2021-01-03 NOTE — Progress Notes (Signed)
Subjective:  Patient ID: [name], female    DOB: [birth year]  Age: 65 y.o. MRN: [id number]  CC: Anemia, Hypertension, and Depression  This visit occurred during the SARS-CoV-2 public health emergency.  Safety protocols were in place, including screening questions prior to the visit, additional usage of staff PPE, and extensive cleaning of exam room while observing appropriate contact time as indicated for disinfecting solutions.    HPI [name] presents for f/up -   She complains of a 1 month history of dizziness and vertigo.  She describes a sensation of spinning when she lays down in her bed and turns her head from side to side.  She has had this before.  It previously responded to steroids.  She also complains of worsening signs of symptoms of depression with frequent awakenings, anxiety, confusion, lack of focus, and feeling hopeless/helpless.  She denies SI or HI.  She has not recently received a B12 injection.  Outpatient Medications Prior to Visit  Medication Sig Dispense Refill   buPROPion (WELLBUTRIN XL) 150 MG 24 hr tablet TAKE 1 TABLET BY MOUTH EVERY DAY 90 tablet 0   DULoxetine (CYMBALTA) 60 MG capsule TAKE 1 CAPSULE BY MOUTH EVERY DAY 90 capsule 1   esomeprazole (NEXIUM) 40 MG capsule TAKE 1 CAPSULE (40 MG TOTAL) BY MOUTH DAILY AT 12 NOON. 90 capsule 1   LINZESS 290 MCG CAPS capsule Take 1 capsule (290 mcg total) by mouth every morning. 90 capsule 1   triamterene-hydrochlorothiazide (MAXZIDE-25) 37.5-25 MG tablet TAKE 1 TABLET BY MOUTH EVERY DAY 90 tablet 0   Vitamin D, Ergocalciferol, (DRISDOL) 1.25 MG (50000 UNIT) CAPS capsule TAKE 1 CAPSULE (50,000 UNITS TOTAL) BY MOUTH EVERY 7 (SEVEN) DAYS 12 capsule 0   POTASSIUM CHLORIDE PO Take by mouth.     thiamine 100 MG tablet Take 1 tablet (100 mg total) by mouth daily. 90 tablet 1   zinc gluconate ([employer] ZINC GLUCONATE) 50 MG tablet Take 1 tablet (50 mg total) by mouth every other day. 45 tablet 1   No facility-administered  medications prior to visit.    ROS Review of Systems  Constitutional:  Positive for fatigue. Negative for appetite change, chills, diaphoresis, fever and unexpected weight change.  HENT: Negative.    Eyes: Negative.   Respiratory:  Negative for cough, chest tightness, shortness of breath and wheezing.   Cardiovascular:  Negative for chest pain, palpitations and leg swelling.  Gastrointestinal:  Negative for abdominal pain, blood in stool, constipation, diarrhea and vomiting.  Endocrine: Negative.   Genitourinary: Negative.  Negative for difficulty urinating.  Musculoskeletal:  Positive for back pain. Negative for arthralgias, joint swelling and myalgias.       She has a 1 week history of nonradiating, right lower back pain, with no lower extremity paresthesias.  She has tried to control the pain with BC powders.  Skin: Negative.   Neurological: Negative.  Negative for dizziness, weakness, light-headedness and headaches.  Hematological:  Negative for adenopathy. Does not bruise/bleed easily.  Psychiatric/Behavioral: Negative.     Objective:  BP 128/82 (BP Location: Right Arm, Patient Position: Sitting) Comment (Cuff Size): thigh cuff   Pulse 93    Temp 98.6 F (37 C) (Oral)    Ht 5\' 3"  (1.6 m)    Wt 240 lb (108.9 kg)    SpO2 96%    BMI 42.51 kg/m   BP Readings from Last 3 Encounters:  01/03/21 128/82  08/28/20 138/76  11/15/19 132/82    Wt Readings  from Last 3 Encounters:  01/03/21 240 lb (108.9 kg)  08/28/20 245 lb (111.1 kg)  11/15/19 250 lb (113.4 kg)    Physical Exam Vitals reviewed.  HENT:     Right Ear: Hearing, tympanic membrane, ear canal and external ear normal.     Left Ear: Hearing, tympanic membrane, ear canal and external ear normal.     Nose: Nose normal.     Mouth/Throat:     Mouth: Mucous membranes are moist.  Eyes:     General: No scleral icterus.    Conjunctiva/sclera: Conjunctivae normal.  Cardiovascular:     Rate and Rhythm: Normal rate and regular  rhythm.     Heart sounds: No murmur heard. Pulmonary:     Effort: Pulmonary effort is normal.     Breath sounds: No stridor. No wheezing, rhonchi or rales.  Abdominal:     General: Abdomen is protuberant. Bowel sounds are normal. There is no distension.     Palpations: Abdomen is soft. There is no hepatomegaly, splenomegaly or mass.     Tenderness: There is no abdominal tenderness. There is no guarding.  Musculoskeletal:        General: Normal range of motion.     Cervical back: Neck supple.     Right lower leg: No edema.     Left lower leg: No edema.  Lymphadenopathy:     Cervical: No cervical adenopathy.  Skin:    General: Skin is warm and dry.  Neurological:     General: No focal deficit present.     Mental Status: She is alert. Mental status is at baseline.  Psychiatric:        Mood and Affect: Mood normal.        Behavior: Behavior normal.    Lab Results  Component Value Date   WBC 8.1 01/03/2021   HGB 11.6 (L) 01/03/2021   HCT 35.6 (L) 01/03/2021   PLT 343.0 01/03/2021   GLUCOSE 92 01/03/2021   CHOL 181 08/29/2020   TRIG 118.0 08/29/2020   HDL 53.20 08/29/2020   LDLCALC 104 (H) 08/29/2020   ALT 17 08/29/2020   AST 17 08/29/2020   NA 138 01/03/2021   K 3.1 (L) 01/03/2021   CL 99 01/03/2021   CREATININE 0.94 01/03/2021   BUN 10 01/03/2021   CO2 30 01/03/2021   TSH 1.97 08/29/2020   HGBA1C 5.8 08/29/2020    CT ABDOMEN PELVIS W CONTRAST  Result Date: 07/14/2019 CLINICAL DATA:  Abdominal pain and nausea EXAM: CT ABDOMEN AND PELVIS WITH CONTRAST TECHNIQUE: Multidetector CT imaging of the abdomen and pelvis was performed using the standard protocol following bolus administration of intravenous contrast. CONTRAST:  OMNIPAQUE IOHEXOL 300 MG/ML  SOLN COMPARISON:  02/20/2015 FINDINGS: LOWER CHEST: Normal. HEPATOBILIARY: Normal hepatic contours. No intra- or extrahepatic biliary dilatation. Status post cholecystectomy. PANCREAS: Normal pancreas. No ductal dilatation  or peripancreatic fluid collection. SPLEEN: Normal. ADRENALS/URINARY TRACT: The adrenal glands are normal. No hydronephrosis, nephroureterolithiasis or solid renal mass. The urinary bladder is normal for degree of distention STOMACH/BOWEL: There is no hiatal hernia. Normal duodenal course and caliber. No small bowel dilatation or inflammation. There is sigmoid and distal descending colon diverticulosis with an area acute inflammation extending from the distal descending colon into the proximal sigmoid colon. There is no fluid collection or free intraperitoneal air. The appendix is not visualized. No right lower quadrant inflammation or free fluid. VASCULAR/LYMPHATIC: There is calcific atherosclerosis of the abdominal aorta. No lymphadenopathy. REPRODUCTIVE: Normal prostate size  with symmetric seminal vesicles. MUSCULOSKELETAL. No bony spinal canal stenosis or focal osseous abnormality. OTHER: None. IMPRESSION: 1. Acute diverticulitis of the distal descending colon and proximal sigmoid colon without abscess or free intraperitoneal air. 2. Aortic Atherosclerosis (ICD10-I70.0). Electronically Signed   By: Deatra Robinson M.D.   On: 07/14/2019 01:15    Assessment & Plan:   [name] was seen today for anemia, hypertension and depression.  Diagnoses and all orders for this visit:  Essential hypertension- Her blood pressure is adequately well controlled.  I will treat the hypokalemia. -     Basic metabolic panel; Future -     Basic metabolic panel -     potassium chloride SA (KLOR-CON M15) 15 MEQ tablet; Take 1 tablet (15 mEq total) by mouth 2 (two) times daily.  Zinc deficiency -     CBC with Differential/Platelet; Future -     zinc gluconate (CVS ZINC GLUCONATE) 50 MG tablet; Take 1 tablet (50 mg total) by mouth every other day. -     CBC with Differential/Platelet  Thiamine deficiency -     CBC with Differential/Platelet; Future -     thiamine 100 MG tablet; Take 1 tablet (100 mg total) by mouth  daily. -     CBC with Differential/Platelet  Vitamin B12 deficiency anemia due to intrinsic factor deficiency- I recommended that she restart parenteral B12 replacement therapy. -     CBC with Differential/Platelet; Future -     CBC with Differential/Platelet  Acute right-sided low back pain without sciatica- I have asked her to stop taking BC powders.  Will try to control the pain with prednisone. -     predniSONE (DELTASONE) 50 MG tablet; Take 1 tablet (50 mg total) by mouth daily with breakfast.  Acute viral labyrinthitis of both ears -     predniSONE (DELTASONE) 50 MG tablet; Take 1 tablet (50 mg total) by mouth daily with breakfast.  Moderate episode of recurrent major depressive disorder (HCC)- I recommended that she add an atypical antipsychotic to her current regimen. -     ARIPiprazole (ABILIFY) 2 MG tablet; Take 1 tablet (2 mg total) by mouth daily.  LAP-BAND surgery status -     zinc gluconate (CVS ZINC GLUCONATE) 50 MG tablet; Take 1 tablet (50 mg total) by mouth every other day. -     thiamine 100 MG tablet; Take 1 tablet (100 mg total) by mouth daily.  Hypokalemia due to inadequate potassium intake -     potassium chloride SA (KLOR-CON M15) 15 MEQ tablet; Take 1 tablet (15 mEq total) by mouth 2 (two) times daily.   I have discontinued [name]'s POTASSIUM CHLORIDE PO. I am also having her start on predniSONE, ARIPiprazole, and potassium chloride SA. Additionally, I am having her maintain her DULoxetine, esomeprazole, Linzess, Vitamin D (Ergocalciferol), triamterene-hydrochlorothiazide, buPROPion, zinc gluconate, and thiamine.  Meds ordered this encounter  Medications   predniSONE (DELTASONE) 50 MG tablet    Sig: Take 1 tablet (50 mg total) by mouth daily with breakfast.    Dispense:  5 tablet    Refill:  0   zinc gluconate (CVS ZINC GLUCONATE) 50 MG tablet    Sig: Take 1 tablet (50 mg total) by mouth every other day.    Dispense:  45 tablet    Refill:  1    thiamine 100 MG tablet    Sig: Take 1 tablet (100 mg total) by mouth daily.    Dispense:  90 tablet  Refill:  1   ARIPiprazole (ABILIFY) 2 MG tablet    Sig: Take 1 tablet (2 mg total) by mouth daily.    Dispense:  30 tablet    Refill:  0   potassium chloride SA (KLOR-CON M15) 15 MEQ tablet    Sig: Take 1 tablet (15 mEq total) by mouth 2 (two) times daily.    Dispense:  180 tablet    Refill:  0     Follow-up: Return in about 3 months (around 04/03/2021).  [name], MD

## 2021-01-08 ENCOUNTER — Other Ambulatory Visit: Payer: Self-pay | Admitting: Internal Medicine

## 2021-01-08 DIAGNOSIS — I10 Essential (primary) hypertension: Secondary | ICD-10-CM

## 2021-01-08 DIAGNOSIS — E876 Hypokalemia: Secondary | ICD-10-CM

## 2021-01-08 MED ORDER — POTASSIUM CHLORIDE CRYS ER 10 MEQ PO TBCR: 10.0000 meq | EXTENDED_RELEASE_TABLET | Freq: Three times a day (TID) | ORAL | 0 refills | Status: DC

## 2021-01-15 ENCOUNTER — Other Ambulatory Visit: Payer: Self-pay | Admitting: Internal Medicine

## 2021-01-15 DIAGNOSIS — E559 Vitamin D deficiency, unspecified: Secondary | ICD-10-CM

## 2021-01-19 DIAGNOSIS — H524 Presbyopia: Secondary | ICD-10-CM | POA: Diagnosis not present

## 2021-01-25 ENCOUNTER — Other Ambulatory Visit: Payer: Self-pay | Admitting: Internal Medicine

## 2021-01-25 DIAGNOSIS — F331 Major depressive disorder, recurrent, moderate: Secondary | ICD-10-CM

## 2021-02-06 ENCOUNTER — Other Ambulatory Visit: Payer: Self-pay | Admitting: Internal Medicine

## 2021-02-06 DIAGNOSIS — F418 Other specified anxiety disorders: Secondary | ICD-10-CM

## 2021-02-06 DIAGNOSIS — K2100 Gastro-esophageal reflux disease with esophagitis, without bleeding: Secondary | ICD-10-CM

## 2021-02-07 ENCOUNTER — Other Ambulatory Visit: Payer: Self-pay | Admitting: Internal Medicine

## 2021-02-07 DIAGNOSIS — I10 Essential (primary) hypertension: Secondary | ICD-10-CM

## 2021-03-10 ENCOUNTER — Other Ambulatory Visit: Payer: Self-pay | Admitting: Internal Medicine

## 2021-03-10 DIAGNOSIS — F331 Major depressive disorder, recurrent, moderate: Secondary | ICD-10-CM

## 2021-03-21 ENCOUNTER — Other Ambulatory Visit: Payer: Self-pay

## 2021-03-21 ENCOUNTER — Ambulatory Visit (INDEPENDENT_AMBULATORY_CARE_PROVIDER_SITE_OTHER): Payer: 59 | Admitting: Nurse Practitioner

## 2021-03-21 ENCOUNTER — Other Ambulatory Visit: Payer: Self-pay | Admitting: Nurse Practitioner

## 2021-03-21 VITALS — BP 136/90 | HR 105 | Temp 98.7°F | Ht 63.0 in | Wt 244.2 lb

## 2021-03-21 DIAGNOSIS — R69 Illness, unspecified: Secondary | ICD-10-CM | POA: Diagnosis not present

## 2021-03-21 DIAGNOSIS — F331 Major depressive disorder, recurrent, moderate: Secondary | ICD-10-CM | POA: Diagnosis not present

## 2021-03-21 DIAGNOSIS — G2581 Restless legs syndrome: Secondary | ICD-10-CM

## 2021-03-21 DIAGNOSIS — E611 Iron deficiency: Secondary | ICD-10-CM

## 2021-03-21 LAB — CBC
HCT: 35.8 % — ABNORMAL LOW (ref 36.0–46.0)
Hemoglobin: 11.2 g/dL — ABNORMAL LOW (ref 12.0–15.0)
MCHC: 31.3 g/dL (ref 30.0–36.0)
MCV: 87 fl (ref 78.0–100.0)
Platelets: 387 10*3/uL (ref 150.0–400.0)
RBC: 4.11 Mil/uL (ref 3.87–5.11)
RDW: 15 % (ref 11.5–15.5)
WBC: 7.7 10*3/uL (ref 4.0–10.5)

## 2021-03-21 LAB — IRON: Iron: 30 ug/dL — ABNORMAL LOW (ref 42–145)

## 2021-03-21 LAB — FERRITIN: Ferritin: 6 ng/mL — ABNORMAL LOW (ref 10.0–291.0)

## 2021-03-21 MED ORDER — IRON (FERROUS SULFATE) 325 (65 FE) MG PO TABS: 325.0000 mg | ORAL_TABLET | Freq: Every day | ORAL | 0 refills | Status: DC

## 2021-03-21 MED ORDER — ROPINIROLE HCL 0.25 MG PO TABS: 0.2500 mg | ORAL_TABLET | Freq: Every day | ORAL | 0 refills | Status: DC

## 2021-03-21 NOTE — Patient Instructions (Signed)
Aripiprazole Tablets What is this medication? ARIPIPRAZOLE (ay ri PIP ray zole) treats schizophrenia, bipolar I disorder, autism spectrum disorder, and Tourette disorder. It may also be used with antidepressant medications to treat depression. It works by balancing the levels of dopamine and serotonin in the brain, hormones that help regulate mood, behaviors, and thoughts. It belongs to a group of medications called antipsychotics. Antipsychotics can be used to treat several kinds of mental health conditions. This medicine may be used for other purposes; ask your health care provider or pharmacist if you have questions. COMMON BRAND NAME(S): Abilify What should I tell my care team before I take this medication? They need to know if you have any of these conditions: Dementia Diabetes Difficulty swallowing Have trouble controlling your muscles Have urges you are unable to control (for example, gambling, spending money, or eating) Heart disease History of irregular heartbeat History of stroke Low blood counts, like low white cell, platelet, or red cell counts Low blood pressure Parkinson disease Seizures Suicidal thoughts, plans or attempt; a previous suicide attempt by you or a family member An unusual or allergic reaction to aripiprazole, other medications, foods, dyes, or preservatives Pregnant or trying to get pregnant Breast-feeding How should I use this medication? Take this medication by mouth with a glass of water. Follow the directions on the prescription label. You can take this medication with or without food. Take your doses at regular intervals. Do not take your medication more often than directed. Do not stop taking except on the advice of your care team. A special MedGuide will be given to you by the pharmacist with each prescription and refill. Be sure to read this information carefully each time. Talk to your care team regarding the use of this medication in children. While  this medication may be prescribed for children as young as 6 years of age for selected conditions, precautions do apply. Overdosage: If you think you have taken too much of this medicine contact a poison control center or emergency room at once. NOTE: This medicine is only for you. Do not share this medicine with others. What if I miss a dose? If you miss a dose, take it as soon as you can. If it is almost time for your next dose, take only that dose. Do not take double or extra doses. What may interact with this medication? Do not take this medication with any of the following: Brexpiprazole Cisapride Dextromethorphan; quinidine Dronedarone Metoclopramide Pimozide Quinidine Thioridazine This medication may also interact with the following: Antihistamines for allergy, cough, and cold Carbamazepine Certain medications for anxiety or sleep Certain medications for depression like amitriptyline, fluoxetine, paroxetine, sertraline Certain medications for fungal infections like fluconazole, itraconazole, ketoconazole, posaconazole, voriconazole Clarithromycin General anesthetics like halothane, isoflurane, methoxyflurane, propofol Levodopa or other medications for Parkinson's disease Medications for blood pressure Medications for seizures Medications that relax muscles for surgery Narcotic medications for pain Other medications that prolong the QT interval (cause an abnormal heart rhythm) Phenothiazines like chlorpromazine, prochlorperazine Rifampin This list may not describe all possible interactions. Give your health care provider a list of all the medicines, herbs, non-prescription drugs, or dietary supplements you use. Also tell them if you smoke, drink alcohol, or use illegal drugs. Some items may interact with your medicine. What should I watch for while using this medication? Visit your care team for regular checks on your progress. Tell your care team if symptoms do not start to  get better or if they get worse. Do not   stop taking except on your care team's advice. You may develop a severe reaction. Your care team will tell you how much medication to take. Patients and their families should watch out for new or worsening depression or thoughts of suicide. Also watch out for sudden changes in feelings such as feeling anxious, agitated, panicky, irritable, hostile, aggressive, impulsive, severely restless, overly excited and hyperactive, or not being able to sleep. If this happens, especially at the beginning of antidepressant treatment or after a change in dose, call your care team. You may get dizzy or drowsy. Do not drive, use machinery, or do anything that needs mental alertness until you know how this medication affects you. Do not stand or sit up quickly, especially if you are an older patient. This reduces the risk of dizzy or fainting spells. Alcohol may interfere with the effect of this medication. Avoid alcoholic drinks. This medication can cause problems with controlling your body temperature. It can lower the response of your body to cold temperatures. If possible, stay indoors during cold weather. If you must go outdoors, wear warm clothes. It can also lower the response of your body to heat. Do not overheat. Do not over-exercise. Stay out of the sun when possible. If you must be in the sun, wear cool clothing. Drink plenty of water. If you have trouble controlling your body temperature, call your care team right away. This medication may cause dry eyes and blurred vision. If you wear contact lenses, you may feel some discomfort. Lubricating drops may help. See your eye care specialist if the problem does not go away or is severe. This medication may increase blood sugar. Ask your care team if changes in diet or medications are needed if you have diabetes. There have been reports of increased sexual urges or other strong urges such as gambling while taking this medication.  If you experience any of these while taking this medication, you should report this to your care team as soon as possible. What side effects may I notice from receiving this medication? Side effects that you should report to your care team as soon as possible: Allergic reactions--skin rash, itching, hives, swelling of the face, lips, tongue, or throat High blood sugar (hyperglycemia)--increased thirst or amount of urine, unusual weakness or fatigue, blurry vision High fever, stiff muscles, increased sweating, fast or irregular heartbeat, and confusion, which may be signs of neuroleptic malignant syndrome Low blood pressure--dizziness, feeling faint or lightheaded, blurry vision Pain or trouble swallowing Prolonged or painful erection Seizures Stroke--sudden numbness or weakness of the face, arm, or leg, trouble speaking, confusion, trouble walking, loss of balance or coordination, dizziness, severe headache, change in vision Uncontrolled and repetitive body movements, muscle stiffness or spasms, tremors or shaking, loss of balance or coordination, restlessness, shuffling walk, which may be signs of extrapyramidal symptoms (EPS) Thoughts of suicide or self-harm, worsening mood, feelings of depression Urges to engage in impulsive behaviors such as gambling, binge eating, sexual activity, or shopping in ways that are unusual for you Side effects that usually do not require medical attention (report these to your care team if they continue or are bothersome): Constipation Drowsiness Weight gain This list may not describe all possible side effects. Call your doctor for medical advice about side effects. You may report side effects to FDA at 1-800-FDA-1088. Where should I keep my medication? Keep out of the reach of children and pets. Store at room temperature between 15 and 30 degrees C (59 and 86 degrees F).   Throw away any unused medication after the expiration date. NOTE: This sheet is a summary.  It may not cover all possible information. If you have questions about this medicine, talk to your doctor, pharmacist, or health care provider.  2022 Elsevier/Gold Standard (2020-09-11 00:00:00)  

## 2021-03-21 NOTE — Progress Notes (Addendum)
? ? ? ?Subjective:  ?Patient ID: [name], female    DOB: [birth year]  Age: 66 y.o. MRN: [id number] ? ?CC:  ?Chief Complaint  ?Patient presents with  ? restless leg  ? trouble sleeping  ? Anxiety  ? Dizziness  ?  More at night when she lie down   ?  ? ? ?HPI  ?This patient arrives today for the above. ? ?Her main concern today is that she experiences a "tingling" sensation in her knees at night and feels the desire to move them. She reports she has had similar symptoms in the past (5 years ago) and was given mediation which resulted in improvement of her symptoms. She tells me she is not sure of the name of medication but ropinirole sounds familiar to her.  Chart review she does have history of anemia and it does appear she may have B12 deficiency.  Last time B12 was checked it was in the low normal range.  This was collected in 08/2020.  She denies knowledge of any history of iron deficiency anemia. ? ?She is also concerned regarding prescription of Abilify which she was given for treatment of her depression by her primary care provider.  She tells me that she looked up the medication and she does not have bipolar and therefore he did not take the medication.  She does report today that her depression is active and seems to be a bit worse because her mother recently passed away.  Per chart review I see patient is on Wellbutrin and Cymbalta as well. ? ?Past Medical History:  ?Diagnosis Date  ? Anxiety disorder   ? Chronic cough   ? Complication of anesthesia   ? Had ERCP in 2006 and led to respiratory failure, caused pancreatits, was in coma for 30days.  ? Depression   ? GERD (gastroesophageal reflux disease)   ? History of ERCP   ? History of pancreatitis   ? History of tracheostomy   ? HTN (hypertension)   ? Hyperglycemia   ? Hypokalemia   ? Respiratory failure (HCC)   ? ? ? ? ?Family History  ?Problem Relation Age of Onset  ? Diabetes Other   ? Hypertension Other   ? ? ?Social History  ? ?Social History  Narrative  ? Regular Exercise -  NO  ? ?Social History  ? ?Tobacco Use  ? Smoking status: Never  ? Smokeless tobacco: Never  ?Substance Use Topics  ? Alcohol use: No  ? ? ? ?Current Meds  ?Medication Sig  ? buPROPion (WELLBUTRIN XL) 150 MG 24 hr tablet TAKE 1 TABLET BY MOUTH EVERY DAY  ? DULoxetine (CYMBALTA) 60 MG capsule TAKE 1 CAPSULE BY MOUTH EVERY DAY  ? esomeprazole (NEXIUM) 40 MG capsule TAKE 1 CAPSULE (40 MG TOTAL) BY MOUTH DAILY AT 12 NOON.  ? LINZESS 290 MCG CAPS capsule Take 1 capsule (290 mcg total) by mouth every morning.  ? potassium chloride (KLOR-CON M) 10 MEQ tablet Take 1 tablet (10 mEq total) by mouth 3 (three) times daily.  ? rOPINIRole (REQUIP) 0.25 MG tablet Take 1 tablet (0.25 mg total) by mouth at bedtime.  ? thiamine 100 MG tablet Take 1 tablet (100 mg total) by mouth daily.  ? triamterene-hydrochlorothiazide (MAXZIDE-25) 37.5-25 MG tablet TAKE 1 TABLET BY MOUTH EVERY DAY  ? Vitamin D, Ergocalciferol, (DRISDOL) 1.25 MG (50000 UNIT) CAPS capsule TAKE 1 CAPSULE (50,000 UNITS TOTAL) BY MOUTH EVERY 7 (SEVEN) DAYS  ? zinc gluconate (CVS ZINC GLUCONATE) 50 MG  tablet Take 1 tablet (50 mg total) by mouth every other day.  ? ? ?ROS:  ?Review of Systems  ?Constitutional:  Negative for fever.  ?Respiratory:  Negative for shortness of breath.   ?Cardiovascular:  Negative for chest pain.  ?Musculoskeletal:  Positive for myalgias.  ?Neurological:  Positive for dizziness (only when laying down).  ?Psychiatric/Behavioral:  Positive for depression. Negative for suicidal ideas.   ? ? ?Objective:  ? ?Today's Vitals: BP 136/90   Pulse (!) 105   Temp 98.7 ?F (37.1 ?C) (Oral)   Ht 5\' 3"  (1.6 m)   Wt 244 lb 4 oz (110.8 kg)   SpO2 99%   BMI 43.27 kg/m?  ?Vitals with BMI 03/21/2021 01/03/2021 08/28/2020  ?Height 5\' 3"  5\' 3"  5\' 3"   ?Weight 244 lbs 4 oz 240 lbs 245 lbs  ?BMI 43.28 42.52 43.41  ?Systolic 136 128 08/30/2020  ?Diastolic 90 82 76  ?Pulse 105 93 94  ?  ?PHQ9 SCORE ONLY 03/21/2021 08/28/2020 08/15/2019  ?PHQ-9  Total Score 12 19 4   ? ? ?Physical Exam ?Vitals reviewed.  ?Constitutional:   ?   General: She is not in acute distress. ?   Appearance: Normal appearance.  ?HENT:  ?   Head: Normocephalic and atraumatic.  ?Neck:  ?   Vascular: No carotid bruit.  ?Cardiovascular:  ?   Rate and Rhythm: Normal rate and regular rhythm.  ?   Pulses: Normal pulses.  ?   Heart sounds: Normal heart sounds.  ?Pulmonary:  ?   Effort: Pulmonary effort is normal.  ?   Breath sounds: Normal breath sounds.  ?Skin: ?   General: Skin is warm and dry.  ?Neurological:  ?   General: No focal deficit present.  ?   Mental Status: She is alert and oriented to person, place, and time.  ?Psychiatric:     ?   Mood and Affect: Mood normal.     ?   Behavior: Behavior normal.     ?   Judgment: Judgment normal.  ? ? ? ? ? ? ? ?Assessment and Plan  ? ?1. Restless leg syndrome   ?2. Moderate episode of recurrent major depressive disorder (HCC)   ? ? ? ?Plan: ?See plan via problem list below. ? ? ? ?Tests ordered ?Orders Placed This Encounter  ?Procedures  ? CBC  ? Iron  ? Ferritin  ? ? ? ? ?Meds ordered this encounter  ?Medications  ? rOPINIRole (REQUIP) 0.25 MG tablet  ?  Sig: Take 1 tablet (0.25 mg total) by mouth at bedtime.  ?  Dispense:  30 tablet  ?  Refill:  0  ?  Order Specific Question:   Supervising Provider  ?  Answer:   161 03/23/2021  ? ? ?Patient to follow-up later this month as scheduled, or sooner as needed. ? ?08/30/2020, NP ? ?

## 2021-03-21 NOTE — Assessment & Plan Note (Signed)
Chronic, stable.  I did discuss that Abilify can be added to chronic antidepressant medications for patients who have not had complete resolution of the depression.  She is encouraged to consider trying it to see if this will help with her mood, but to discuss further with PCP at next office visit if she has any further questions or decides not to take it.  She reports her understanding. ?

## 2021-03-21 NOTE — Assessment & Plan Note (Signed)
We will treat her with low-dose Requip that she can take at night as needed.  We will also check CBC and iron stores to rule out iron deficiency anemia.  She was encouraged to let her PCP know at follow-up later on this month if symptoms persist and do not improve. ?

## 2021-03-22 ENCOUNTER — Other Ambulatory Visit: Payer: Self-pay | Admitting: Nurse Practitioner

## 2021-03-22 DIAGNOSIS — E611 Iron deficiency: Secondary | ICD-10-CM

## 2021-03-22 MED ORDER — ACCRUFER 30 MG PO CAPS: 1.0000 | ORAL_CAPSULE | Freq: Two times a day (BID) | ORAL | 2 refills | Status: DC

## 2021-04-03 ENCOUNTER — Ambulatory Visit: Payer: 59 | Admitting: Internal Medicine

## 2021-04-06 ENCOUNTER — Other Ambulatory Visit: Payer: Self-pay | Admitting: Internal Medicine

## 2021-04-06 DIAGNOSIS — E876 Hypokalemia: Secondary | ICD-10-CM

## 2021-04-06 DIAGNOSIS — I10 Essential (primary) hypertension: Secondary | ICD-10-CM

## 2021-04-10 ENCOUNTER — Other Ambulatory Visit: Payer: Self-pay | Admitting: Internal Medicine

## 2021-04-10 DIAGNOSIS — E559 Vitamin D deficiency, unspecified: Secondary | ICD-10-CM

## 2021-04-13 ENCOUNTER — Other Ambulatory Visit: Payer: Self-pay | Admitting: Nurse Practitioner

## 2021-04-13 DIAGNOSIS — G2581 Restless legs syndrome: Secondary | ICD-10-CM

## 2021-05-01 DIAGNOSIS — M25522 Pain in left elbow: Secondary | ICD-10-CM | POA: Diagnosis not present

## 2021-05-05 ENCOUNTER — Other Ambulatory Visit: Payer: Self-pay | Admitting: Internal Medicine

## 2021-05-05 DIAGNOSIS — I10 Essential (primary) hypertension: Secondary | ICD-10-CM

## 2021-05-21 ENCOUNTER — Encounter: Payer: Self-pay | Admitting: Internal Medicine

## 2021-05-21 ENCOUNTER — Ambulatory Visit (INDEPENDENT_AMBULATORY_CARE_PROVIDER_SITE_OTHER): Payer: 59 | Admitting: Internal Medicine

## 2021-05-21 ENCOUNTER — Telehealth: Payer: Self-pay

## 2021-05-21 VITALS — BP 128/80 | HR 94 | Temp 98.5°F | Ht 63.0 in | Wt 248.0 lb

## 2021-05-21 DIAGNOSIS — E2839 Other primary ovarian failure: Secondary | ICD-10-CM | POA: Diagnosis not present

## 2021-05-21 DIAGNOSIS — D510 Vitamin B12 deficiency anemia due to intrinsic factor deficiency: Secondary | ICD-10-CM | POA: Diagnosis not present

## 2021-05-21 DIAGNOSIS — R69 Illness, unspecified: Secondary | ICD-10-CM | POA: Diagnosis not present

## 2021-05-21 DIAGNOSIS — N1831 Chronic kidney disease, stage 3a: Secondary | ICD-10-CM | POA: Insufficient documentation

## 2021-05-21 DIAGNOSIS — I10 Essential (primary) hypertension: Secondary | ICD-10-CM | POA: Diagnosis not present

## 2021-05-21 DIAGNOSIS — F331 Major depressive disorder, recurrent, moderate: Secondary | ICD-10-CM | POA: Diagnosis not present

## 2021-05-21 DIAGNOSIS — Z23 Encounter for immunization: Secondary | ICD-10-CM | POA: Diagnosis not present

## 2021-05-21 LAB — CBC WITH DIFFERENTIAL/PLATELET
Basophils Absolute: 0.1 10*3/uL (ref 0.0–0.1)
Basophils Relative: 0.8 % (ref 0.0–3.0)
Eosinophils Absolute: 0.2 10*3/uL (ref 0.0–0.7)
Eosinophils Relative: 1.9 % (ref 0.0–5.0)
HCT: 35.1 % — ABNORMAL LOW (ref 36.0–46.0)
Hemoglobin: 11.3 g/dL — ABNORMAL LOW (ref 12.0–15.0)
Lymphocytes Relative: 25.6 % (ref 12.0–46.0)
Lymphs Abs: 2.1 10*3/uL (ref 0.7–4.0)
MCHC: 32.1 g/dL (ref 30.0–36.0)
MCV: 87.7 fl (ref 78.0–100.0)
Monocytes Absolute: 0.5 10*3/uL (ref 0.1–1.0)
Monocytes Relative: 6.1 % (ref 3.0–12.0)
Neutro Abs: 5.3 10*3/uL (ref 1.4–7.7)
Neutrophils Relative %: 65.6 % (ref 43.0–77.0)
Platelets: 358 10*3/uL (ref 150.0–400.0)
RBC: 4 Mil/uL (ref 3.87–5.11)
RDW: 16 % — ABNORMAL HIGH (ref 11.5–15.5)
WBC: 8.1 10*3/uL (ref 4.0–10.5)

## 2021-05-21 LAB — BASIC METABOLIC PANEL
BUN: 17 mg/dL (ref 6–23)
CO2: 32 mEq/L (ref 19–32)
Calcium: 9.6 mg/dL (ref 8.4–10.5)
Chloride: 103 mEq/L (ref 96–112)
Creatinine, Ser: 1.17 mg/dL (ref 0.40–1.20)
GFR: 48.91 mL/min — ABNORMAL LOW (ref >60.00)
Glucose, Bld: 92 mg/dL (ref 70–99)
Potassium: 4.3 mEq/L (ref 3.5–5.1)
Sodium: 141 mEq/L (ref 135–145)

## 2021-05-21 MED ORDER — CYANOCOBALAMIN 1000 MCG/ML IJ SOLN
1000.0000 ug | Freq: Once | INTRAMUSCULAR | Status: AC
  Administered 2021-05-21: 1000 ug via INTRAMUSCULAR

## 2021-05-21 MED ORDER — SEMAGLUTIDE-WEIGHT MANAGEMENT 0.25 MG/0.5ML ~~LOC~~ SOAJ: 0.2500 mg | SUBCUTANEOUS | 0 refills | Status: AC

## 2021-05-21 MED ORDER — SEMAGLUTIDE-WEIGHT MANAGEMENT 2.4 MG/0.75ML ~~LOC~~ SOAJ: 2.4000 mg | SUBCUTANEOUS | 0 refills | Status: DC

## 2021-05-21 MED ORDER — SEMAGLUTIDE-WEIGHT MANAGEMENT 1.7 MG/0.75ML ~~LOC~~ SOAJ: 1.7000 mg | SUBCUTANEOUS | 0 refills | Status: DC

## 2021-05-21 MED ORDER — SEMAGLUTIDE-WEIGHT MANAGEMENT 0.5 MG/0.5ML ~~LOC~~ SOAJ: 0.5000 mg | SUBCUTANEOUS | 0 refills | Status: DC

## 2021-05-21 MED ORDER — BUPROPION HCL ER (XL) 300 MG PO TB24
300.0000 mg | ORAL_TABLET | Freq: Every day | ORAL | 1 refills | Status: DC
Start: 2021-05-21 — End: 2021-07-24

## 2021-05-21 MED ORDER — SEMAGLUTIDE-WEIGHT MANAGEMENT 1 MG/0.5ML ~~LOC~~ SOAJ: 1.0000 mg | SUBCUTANEOUS | 0 refills | Status: DC

## 2021-05-21 NOTE — Patient Instructions (Signed)
Anemia ? ?Anemia is a condition in which there is not enough red blood cells or hemoglobin in the blood. Hemoglobin is a substance in red blood cells that carries oxygen. ?When you do not have enough red blood cells or hemoglobin (are anemic), your body cannot get enough oxygen and your organs may not work properly. As a result, you may feel very tired or have other problems. ?What are the causes? ?Common causes of anemia include: ?Excessive bleeding. Anemia can be caused by excessive bleeding inside or outside the body, including bleeding from the intestines or from heavy menstrual periods in females. ?Poor nutrition. ?Long-lasting (chronic) kidney, thyroid, and liver disease. ?Bone marrow disorders, spleen problems, and blood disorders. ?Cancer and treatments for cancer. ?HIV (human immunodeficiency virus) and AIDS (acquired immunodeficiency syndrome). ?Infections, medicines, and autoimmune disorders that destroy red blood cells. ?What are the signs or symptoms? ?Symptoms of this condition include: ?Minor weakness. ?Dizziness. ?Headache, or difficulties concentrating and sleeping. ?Heartbeats that feel irregular or faster than normal (palpitations). ?Shortness of breath, especially with exercise. ?Pale skin, lips, and nails, or cold hands and feet. ?Indigestion and nausea. ?Symptoms may occur suddenly or develop slowly. If your anemia is mild, you may not have symptoms. ?How is this diagnosed? ?This condition is diagnosed based on blood tests, your medical history, and a physical exam. In some cases, a test may be needed in which cells are removed from the soft tissue inside of a bone and looked at under a microscope (bone marrow biopsy). Your health care provider may also check your stool (feces) for blood and may do additional testing to look for the cause of your bleeding. ?Other tests may include: ?Imaging tests, such as a CT scan or MRI. ?A procedure to see inside your esophagus and stomach (endoscopy). ?A  procedure to see inside your colon and rectum (colonoscopy). ?How is this treated? ?Treatment for this condition depends on the cause. If you continue to lose a lot of blood, you may need to be treated at a hospital. Treatment may include: ?Taking supplements of iron, vitamin Z30, or folic acid. ?Taking a hormone medicine (erythropoietin) that can help to stimulate red blood cell growth. ?Having a blood transfusion. This may be needed if you lose a lot of blood. ?Making changes to your diet. ?Having surgery to remove your spleen. ?Follow these instructions at home: ?Take over-the-counter and prescription medicines only as told by your health care provider. ?Take supplements only as told by your health care provider. ?Follow any diet instructions that you were given by your health care provider. ?Keep all follow-up visits as told by your health care provider. This is important. ?Contact a health care provider if: ?You develop new bleeding anywhere in the body. ?Get help right away if: ?You are very weak. ?You are short of breath. ?You have pain in your abdomen or chest. ?You are dizzy or feel faint. ?You have trouble concentrating. ?You have bloody stools, black stools, or tarry stools. ?You vomit repeatedly or you vomit up blood. ?These symptoms may represent a serious problem that is an emergency. Do not wait to see if the symptoms will go away. Get medical help right away. Call your local emergency services (911 in the [place] Do not drive yourself to the hospital. ?Summary ?Anemia is a condition in which you do not have enough red blood cells or enough of a substance in your red blood cells that carries oxygen (hemoglobin). ?Symptoms may occur suddenly or develop slowly. ?If your anemia  is mild, you may not have symptoms. ?This condition is diagnosed with blood tests, a medical history, and a physical exam. Other tests may be needed. ?Treatment for this condition depends on the cause of the anemia. ?This  information is not intended to replace advice given to you by your health care provider. Make sure you discuss any questions you have with your health care provider. ?Document Revised: 11/06/2020 Document Reviewed: 11/30/2018 ?Elsevier Patient Education ? [employer]. ? ?

## 2021-05-21 NOTE — Progress Notes (Signed)
Subjective:  Patient ID: [name], female    DOB: 1955-12-08  Age: 66 y.o. MRN: [id number]  CC: Anemia, Depression, and Hypertension   HPI Cintia Gleed presents for f/up -  Her RLS symptoms have resolved with the new iron supplement. She complains of weight gain, sadness, mental slowing, crying spells, anhedonia, and decreased concentration.  Outpatient Medications Prior to Visit  Medication Sig Dispense Refill   ARIPiprazole (ABILIFY) 2 MG tablet TAKE 1 TABLET BY MOUTH EVERY DAY 90 tablet 1   DULoxetine (CYMBALTA) 60 MG capsule TAKE 1 CAPSULE BY MOUTH EVERY DAY 90 capsule 1   esomeprazole (NEXIUM) 40 MG capsule TAKE 1 CAPSULE (40 MG TOTAL) BY MOUTH DAILY AT 12 NOON. 90 capsule 1   Ferric Maltol (ACCRUFER) 30 MG CAPS Take 1 capsule by mouth 2 (two) times daily. 60 capsule 2   KLOR-CON M10 10 MEQ tablet TAKE 1 TABLET (10 MEQ TOTAL) BY MOUTH 3 (THREE) TIMES DAILY. 270 tablet 0   LINZESS 290 MCG CAPS capsule Take 1 capsule (290 mcg total) by mouth every morning. 90 capsule 1   thiamine 100 MG tablet Take 1 tablet (100 mg total) by mouth daily. 90 tablet 1   triamterene-hydrochlorothiazide (MAXZIDE-25) 37.5-25 MG tablet TAKE 1 TABLET BY MOUTH EVERY DAY 90 tablet 0   Vitamin D, Ergocalciferol, (DRISDOL) 1.25 MG (50000 UNIT) CAPS capsule TAKE 1 CAPSULE (50,000 UNITS TOTAL) BY MOUTH EVERY 7 (SEVEN) DAYS 12 capsule 0   zinc gluconate ([employer] ZINC GLUCONATE) 50 MG tablet Take 1 tablet (50 mg total) by mouth every other day. 45 tablet 1   buPROPion (WELLBUTRIN XL) 150 MG 24 hr tablet TAKE 1 TABLET BY MOUTH EVERY DAY 90 tablet 0   rOPINIRole (REQUIP) 0.25 MG tablet Take 1 tablet (0.25 mg total) by mouth at bedtime. 30 tablet 0   No facility-administered medications prior to visit.    ROS Review of Systems  Constitutional:  Positive for unexpected weight change (wt gain). Negative for appetite change, diaphoresis and fatigue.  HENT: Negative.    Eyes: Negative.   Respiratory:  Negative for  cough, chest tightness, shortness of breath and wheezing.   Cardiovascular:  Negative for chest pain, palpitations and leg swelling.  Gastrointestinal:  Negative for abdominal pain, constipation, diarrhea, nausea and vomiting.  Endocrine: Negative.   Genitourinary: Negative.   Musculoskeletal: Negative.   Skin: Negative.   Neurological:  Negative for dizziness, weakness, light-headedness and headaches.  Hematological:  Negative for adenopathy. Does not bruise/bleed easily.  Psychiatric/Behavioral:  Positive for decreased concentration, dysphoric mood and sleep disturbance. Negative for agitation, behavioral problems, confusion, hallucinations, self-injury and suicidal ideas. The patient is not nervous/anxious and is not hyperactive.    Objective:  BP 128/80 (BP Location: Right Arm, Patient Position: Sitting, Cuff Size: Large)   Pulse 94   Temp 98.5 F (36.9 C) (Oral)   Ht 5\' 3"  (1.6 m)   Wt 248 lb (112.5 kg)   SpO2 96%   BMI 43.93 kg/m   BP Readings from Last 3 Encounters:  05/21/21 128/80  03/21/21 136/90  01/03/21 128/82    Wt Readings from Last 3 Encounters:  05/21/21 248 lb (112.5 kg)  03/21/21 244 lb 4 oz (110.8 kg)  01/03/21 240 lb (108.9 kg)    Physical Exam Vitals reviewed.  HENT:     Nose: Nose normal.     Mouth/Throat:     Mouth: Mucous membranes are moist.  Eyes:     General: No scleral icterus.  Conjunctiva/sclera: Conjunctivae normal.  Cardiovascular:     Rate and Rhythm: Normal rate and regular rhythm.     Heart sounds: No murmur heard. Pulmonary:     Effort: Pulmonary effort is normal.     Breath sounds: No stridor. No wheezing, rhonchi or rales.  Abdominal:     General: Abdomen is protuberant. Bowel sounds are normal. There is no distension.     Palpations: Abdomen is soft. There is no hepatomegaly, splenomegaly or mass.     Tenderness: There is no abdominal tenderness.  Musculoskeletal:        General: Normal range of motion.     Cervical  back: Neck supple.     Right lower leg: No edema.     Left lower leg: No edema.  Lymphadenopathy:     Cervical: No cervical adenopathy.  Skin:    General: Skin is warm and dry.  Neurological:     General: No focal deficit present.     Mental Status: She is alert.  Psychiatric:        Mood and Affect: Mood normal.        Behavior: Behavior normal.    Lab Results  Component Value Date   WBC 8.1 05/21/2021   HGB 11.3 (L) 05/21/2021   HCT 35.1 (L) 05/21/2021   PLT 358.0 05/21/2021   GLUCOSE 92 05/21/2021   CHOL 181 08/29/2020   TRIG 118.0 08/29/2020   HDL 53.20 08/29/2020   LDLCALC 104 (H) 08/29/2020   ALT 17 08/29/2020   AST 17 08/29/2020   NA 141 05/21/2021   K 4.3 05/21/2021   CL 103 05/21/2021   CREATININE 1.17 05/21/2021   BUN 17 05/21/2021   CO2 32 05/21/2021   TSH 1.97 08/29/2020   HGBA1C 5.8 08/29/2020    CT ABDOMEN PELVIS W CONTRAST  Result Date: 07/14/2019 CLINICAL DATA:  Abdominal pain and nausea EXAM: CT ABDOMEN AND PELVIS WITH CONTRAST TECHNIQUE: Multidetector CT imaging of the abdomen and pelvis was performed using the standard protocol following bolus administration of intravenous contrast. CONTRAST:  100mL OMNIPAQUE IOHEXOL 300 MG/ML  SOLN COMPARISON:  02/20/2015 FINDINGS: LOWER CHEST: Normal. HEPATOBILIARY: Normal hepatic contours. No intra- or extrahepatic biliary dilatation. Status post cholecystectomy. PANCREAS: Normal pancreas. No ductal dilatation or peripancreatic fluid collection. SPLEEN: Normal. ADRENALS/URINARY TRACT: The adrenal glands are normal. No hydronephrosis, nephroureterolithiasis or solid renal mass. The urinary bladder is normal for degree of distention STOMACH/BOWEL: There is no hiatal hernia. Normal duodenal course and caliber. No small bowel dilatation or inflammation. There is sigmoid and distal descending colon diverticulosis with an area acute inflammation extending from the distal descending colon into the proximal sigmoid colon. There is  no fluid collection or free intraperitoneal air. The appendix is not visualized. No right lower quadrant inflammation or free fluid. VASCULAR/LYMPHATIC: There is calcific atherosclerosis of the abdominal aorta. No lymphadenopathy. REPRODUCTIVE: Normal prostate size with symmetric seminal vesicles. MUSCULOSKELETAL. No bony spinal canal stenosis or focal osseous abnormality. OTHER: None. IMPRESSION: 1. Acute diverticulitis of the distal descending colon and proximal sigmoid colon without abscess or free intraperitoneal air. 2. Aortic Atherosclerosis (ICD10-I70.0). Electronically Signed   By: Deatra RobinsonKevin  Herman M.D.   On: 07/14/2019 01:15    Assessment & Plan:   [name] was seen today for anemia, depression and hypertension.  Diagnoses and all orders for this visit:  Essential hypertension- Her BP is well controlled. -     Basic metabolic panel; Future -     Basic metabolic panel  Obesity,  Class III, BMI 40-49.9 (morbid obesity) (HCC)- Will start a high-dose GLP-1 agonist. -     Semaglutide-Weight Management 0.25 MG/0.5ML SOAJ; Inject 0.25 mg into the skin once a week for 28 days. -     Semaglutide-Weight Management 0.5 MG/0.5ML SOAJ; Inject 0.5 mg into the skin once a week for 28 days. -     Semaglutide-Weight Management 1 MG/0.5ML SOAJ; Inject 1 mg into the skin once a week for 28 days. -     Semaglutide-Weight Management 1.7 MG/0.75ML SOAJ; Inject 1.7 mg into the skin once a week for 28 days. -     Semaglutide-Weight Management 2.4 MG/0.75ML SOAJ; Inject 2.4 mg into the skin once a week for 28 days.  Moderate episode of recurrent major depressive disorder (HCC)- Will increase the dose of bupropion. -     buPROPion (WELLBUTRIN XL) 300 MG 24 hr tablet; Take 1 tablet (300 mg total) by mouth daily.  Vitamin B12 deficiency anemia due to intrinsic factor deficiency -     CBC with Differential/Platelet; Future -     cyanocobalamin ((VITAMIN B-12)) injection 1,000 mcg -     CBC with  Differential/Platelet  Estrogen deficiency -     DG Bone Density; Future  Stage 3a chronic kidney disease (HCC)- She is avoiding nephrotoxic agents.  Will continue to maintain control of her blood pressure.  Other orders -     Varicella-zoster vaccine IM (Shingrix)   I have discontinued Liborio Nixon Berkemeier's buPROPion and rOPINIRole. I am also having her start on Semaglutide-Weight Management, Semaglutide-Weight Management, Semaglutide-Weight Management, Semaglutide-Weight Management, and buPROPion. Additionally, I am having her maintain her Linzess, zinc gluconate, thiamine, ARIPiprazole, DULoxetine, esomeprazole, ACCRUFeR, Klor-Con M10, Vitamin D (Ergocalciferol), and triamterene-hydrochlorothiazide. We administered cyanocobalamin.  Meds ordered this encounter  Medications   Semaglutide-Weight Management 0.25 MG/0.5ML SOAJ    Sig: Inject 0.25 mg into the skin once a week for 28 days.    Dispense:  2 mL    Refill:  0   DISCONTD: Semaglutide-Weight Management 0.5 MG/0.5ML SOAJ    Sig: Inject 0.5 mg into the skin once a week for 28 days.    Dispense:  2 mL    Refill:  0   Semaglutide-Weight Management 1 MG/0.5ML SOAJ    Sig: Inject 1 mg into the skin once a week for 28 days.    Dispense:  2 mL    Refill:  0   Semaglutide-Weight Management 1.7 MG/0.75ML SOAJ    Sig: Inject 1.7 mg into the skin once a week for 28 days.    Dispense:  3 mL    Refill:  0   Semaglutide-Weight Management 2.4 MG/0.75ML SOAJ    Sig: Inject 2.4 mg into the skin once a week for 28 days.    Dispense:  3 mL    Refill:  0   buPROPion (WELLBUTRIN XL) 300 MG 24 hr tablet    Sig: Take 1 tablet (300 mg total) by mouth daily.    Dispense:  90 tablet    Refill:  1   cyanocobalamin ((VITAMIN B-12)) injection 1,000 mcg     Follow-up: Return in about 6 months (around 11/21/2021).  [name], MD

## 2021-05-21 NOTE — Telephone Encounter (Signed)
Key: [id number] ?

## 2021-05-22 ENCOUNTER — Other Ambulatory Visit: Payer: Self-pay | Admitting: Internal Medicine

## 2021-05-23 NOTE — Telephone Encounter (Signed)
approved from 05/22/2021 to 12/22/2021

## 2021-06-05 ENCOUNTER — Other Ambulatory Visit: Payer: Self-pay | Admitting: Internal Medicine

## 2021-06-05 DIAGNOSIS — F331 Major depressive disorder, recurrent, moderate: Secondary | ICD-10-CM

## 2021-06-12 ENCOUNTER — Other Ambulatory Visit: Payer: Self-pay | Admitting: Nurse Practitioner

## 2021-06-12 DIAGNOSIS — E611 Iron deficiency: Secondary | ICD-10-CM

## 2021-06-17 ENCOUNTER — Other Ambulatory Visit: Payer: Self-pay | Admitting: Internal Medicine

## 2021-06-17 MED ORDER — WEGOVY 0.5 MG/0.5ML ~~LOC~~ SOAJ: 0.5000 mg | SUBCUTANEOUS | 0 refills | Status: DC

## 2021-06-24 ENCOUNTER — Other Ambulatory Visit: Payer: Self-pay | Admitting: Internal Medicine

## 2021-06-24 MED ORDER — SEMAGLUTIDE-WEIGHT MANAGEMENT 1.7 MG/0.75ML ~~LOC~~ SOAJ: 1.7000 mg | SUBCUTANEOUS | 0 refills | Status: DC

## 2021-06-29 ENCOUNTER — Other Ambulatory Visit: Payer: Self-pay | Admitting: Internal Medicine

## 2021-06-29 DIAGNOSIS — E559 Vitamin D deficiency, unspecified: Secondary | ICD-10-CM

## 2021-07-04 ENCOUNTER — Other Ambulatory Visit: Payer: Self-pay | Admitting: Internal Medicine

## 2021-07-04 DIAGNOSIS — E559 Vitamin D deficiency, unspecified: Secondary | ICD-10-CM

## 2021-07-07 ENCOUNTER — Other Ambulatory Visit: Payer: Self-pay | Admitting: Internal Medicine

## 2021-07-07 DIAGNOSIS — E876 Hypokalemia: Secondary | ICD-10-CM

## 2021-07-07 DIAGNOSIS — I10 Essential (primary) hypertension: Secondary | ICD-10-CM

## 2021-07-16 ENCOUNTER — Encounter: Payer: Self-pay | Admitting: Pulmonary Disease

## 2021-07-16 ENCOUNTER — Ambulatory Visit (INDEPENDENT_AMBULATORY_CARE_PROVIDER_SITE_OTHER): Payer: 59 | Admitting: Pulmonary Disease

## 2021-07-16 VITALS — BP 130/84 | HR 94 | Ht 63.0 in | Wt 248.4 lb

## 2021-07-16 DIAGNOSIS — R918 Other nonspecific abnormal finding of lung field: Secondary | ICD-10-CM

## 2021-07-16 DIAGNOSIS — G4733 Obstructive sleep apnea (adult) (pediatric): Secondary | ICD-10-CM

## 2021-07-16 NOTE — Patient Instructions (Signed)
  X CT chest wo con to follow on nodules   CPAP is working well

## 2021-07-16 NOTE — Assessment & Plan Note (Signed)
CPAP download was reviewed which showed good control of events on auto settings with average pressure of 15 cm, no residual events and mild leak.  She is very compliant and CPAP is only helped improve her daytime somnolence and fatigue. We will send a prescription for a new nasal mask to DME.  If she develops a mouth leak, she can use a chinstrap I discussed alternative treatment options and unfortunately she would not qualify Weight loss encouraged, compliance with goal of at least 4-6 hrs every night is the expectation. Advised against medications with sedative side effects Cautioned against driving when sleepy - understanding that sleepiness will vary on a day to day basis

## 2021-07-16 NOTE — Assessment & Plan Note (Signed)
75-month follow-up CT chest was stable in 2020, we will arrange for a follow-up CT chest without contrast Possibility of sarcoidosis was discussed versus other benign cause

## 2021-07-16 NOTE — Progress Notes (Signed)
Subjective:    Patient ID: [name], female    DOB: 1955/11/13, 66 y.o.   MRN: [id number]  HPI   66 yo woman presents to reestablish care for OSA & lung nodules She has lived in [place] for 15 years, in [place] for 12 years and [place], [place] for about 2 years  PMH - She required ERCP for acute pancreatitis foll by resp failure requiring tstomy  Lap band sx in 12/2008 , 01/2010 band flipped, required surgery, regained wt lost She underwent TAH + BSO in '92   OSA was.  Testing 2006 She was last seen in 2019, maintained on auto CPAP 10 to 15 cm. She has lost weight, about 20 pounds down to 248 Her mother passed away and she is being treated for depression with Cymbalta, Wellbutrin and Abilify has been added.  She reports chronic insomnia and used to take Benadryl OTC, she does not need this anymore. She was on an AirFit F30 fullface mask and would like to switch to nasal mask. Upon sleepiness score is 3. Bedtime is around 11 PM, sleep latency about 1.5 hours, she sleeps on her side with 1-2 pillows, denies nocturnal awakenings and is out of bed by 7 AM feeling rested without dryness of mouth or headaches There is no history suggestive of cataplexy, sleep paralysis or parasomnias  She was noted to have multiple lung nodules on CT chest in 2019 and a follow-up CT chest in 4 months showed stable nodules  Significant tests/ events reviewed  CT chest w con 02/2018 >> stable nodules  Urine histo neg ACE 11/2017  50 CT chest without contrast 10/2017 - multiple subcentimeter nodules scattered throughout both lungs, largest about 7 mm in size.  Chronic elevation right hemidiaphragm   PSG 2006 (Wt 264)>> severe OSA with AHI 52/h including 50 obstructive apneas & 81 hypopneas corrected by 14 cm , nadir desatn to 69%.    PSG 02/2011 - 269 lbs - Severe  with hypopneas AHI 56/h causing sleep problems, fragmentation, and oxygen desaturation >> CPAP of 15 cm , small full- face  mask   Past Medical History:  Diagnosis Date   Anxiety disorder    Chronic cough    Complication of anesthesia    Had ERCP in 2006 and led to respiratory failure, caused pancreatits, was in coma for 30days.   Depression    GERD (gastroesophageal reflux disease)    History of ERCP    History of pancreatitis    History of tracheostomy    HTN (hypertension)    Hyperglycemia    Hypokalemia    Respiratory failure (HCC)     Past Surgical History:  Procedure Laterality Date   ABDOMINAL HYSTERECTOMY     Total Hysterectomy    APPENDECTOMY     BREAST REDUCTION SURGERY     CHOLECYSTECTOMY     COLONOSCOPY WITH PROPOFOL N/A 12/21/2015   Procedure: COLONOSCOPY WITH PROPOFOL;  Surgeon: Jeani Hawking, MD;  Location: [facility];  Service: Endoscopy;  Laterality: N/A;   ERCP     ESOPHAGOGASTRODUODENOSCOPY N/A 12/21/2015   Procedure: ESOPHAGOGASTRODUODENOSCOPY (EGD);  Surgeon: Jeani Hawking, MD;  Location: [facility];  Service: Endoscopy;  Laterality: N/A;   GIVENS CAPSULE STUDY N/A 01/29/2016   Procedure: GIVENS CAPSULE STUDY;  Surgeon: Charna Elizabeth, MD;  Location: [facility] ENDOSCOPY;  Service: Endoscopy;  Laterality: N/A;   HAMMER TOE SURGERY     INTESTINAL MALROTATION REPAIR  1999   TUBAL LIGATION  Allergies  Allergen Reactions   Fastin [Phentermine]     headache   Erythromycin Nausea And Vomiting   Lisinopril     REACTION: cough, facial swelling    Social History   Socioeconomic History   Marital status: Married    Spouse name: Not on file   Number of children: Not on file   Years of education: Not on file   Highest education level: Not on file  Occupational History   Occupation: Occupational hygienist: [employer]  Tobacco Use   Smoking status: Never   Smokeless tobacco: Never  Vaping Use   Vaping Use: Never used  Substance and Sexual Activity   Alcohol use: No   Drug use: No   Sexual activity: Not on file  Other Topics Concern   Not on file  Social History  Narrative   Regular Exercise -  NO   Social Determinants of Health   Financial Resource Strain: Not on file  Food Insecurity: Not on file  Transportation Needs: Not on file  Physical Activity: Not on file  Stress: Not on file  Social Connections: Not on file  Intimate Partner Violence: Not on file    Family History  Problem Relation Age of Onset   Diabetes Other    Hypertension Other       Review of Systems Positive for anxiety and depression  Constitutional: negative for anorexia, fevers and sweats  Eyes: negative for irritation, redness and visual disturbance  Ears, nose, mouth, throat, and face: negative for earaches, epistaxis, nasal congestion and sore throat  Respiratory: negative for cough, dyspnea on exertion, sputum and wheezing  Cardiovascular: negative for chest pain, dyspnea, lower extremity edema, orthopnea, palpitations and syncope  Gastrointestinal: negative for abdominal pain, constipation, diarrhea, melena, nausea and vomiting  Genitourinary:negative for dysuria, frequency and hematuria  Hematologic/lymphatic: negative for bleeding, easy bruising and lymphadenopathy  Musculoskeletal:negative for arthralgias, muscle weakness and stiff joints  Neurological: negative for coordination problems, gait problems, headaches and weakness  Endocrine: negative for diabetic symptoms including polydipsia, polyuria and weight loss     Objective:   Physical Exam   Gen. Pleasant, obese, in no distress, normal affect ENT - no pallor,icterus, no post nasal drip, class 2-3 airway Neck: No JVD, no thyromegaly, no carotid bruits Lungs: no use of accessory muscles, no dullness to percussion, decreased without rales or rhonchi  Cardiovascular: Rhythm regular, heart sounds  normal, no murmurs or gallops, no peripheral edema Abdomen: soft and non-tender, no hepatosplenomegaly, BS normal. Musculoskeletal: No deformities, no cyanosis or clubbing Neuro:  alert, non focal, no  tremors        Assessment & Plan:

## 2021-07-22 ENCOUNTER — Ambulatory Visit: Payer: 59 | Admitting: Internal Medicine

## 2021-07-24 ENCOUNTER — Encounter: Payer: Self-pay | Admitting: Internal Medicine

## 2021-07-24 ENCOUNTER — Ambulatory Visit (INDEPENDENT_AMBULATORY_CARE_PROVIDER_SITE_OTHER): Payer: 59 | Admitting: Internal Medicine

## 2021-07-24 VITALS — BP 118/70 | HR 91 | Temp 98.3°F | Ht 63.0 in | Wt 246.0 lb

## 2021-07-24 DIAGNOSIS — K5904 Chronic idiopathic constipation: Secondary | ICD-10-CM

## 2021-07-24 DIAGNOSIS — I10 Essential (primary) hypertension: Secondary | ICD-10-CM | POA: Diagnosis not present

## 2021-07-24 DIAGNOSIS — R69 Illness, unspecified: Secondary | ICD-10-CM | POA: Diagnosis not present

## 2021-07-24 DIAGNOSIS — G252 Other specified forms of tremor: Secondary | ICD-10-CM | POA: Insufficient documentation

## 2021-07-24 DIAGNOSIS — F331 Major depressive disorder, recurrent, moderate: Secondary | ICD-10-CM

## 2021-07-24 DIAGNOSIS — N1831 Chronic kidney disease, stage 3a: Secondary | ICD-10-CM | POA: Diagnosis not present

## 2021-07-24 DIAGNOSIS — E8881 Metabolic syndrome: Secondary | ICD-10-CM | POA: Diagnosis not present

## 2021-07-24 LAB — URINALYSIS, ROUTINE W REFLEX MICROSCOPIC
Bilirubin Urine: NEGATIVE
Hgb urine dipstick: NEGATIVE
Ketones, ur: NEGATIVE
Leukocytes,Ua: NEGATIVE
Nitrite: NEGATIVE
Specific Gravity, Urine: 1.02 (ref 1.000–1.030)
Total Protein, Urine: NEGATIVE
Urine Glucose: NEGATIVE
Urobilinogen, UA: 0.2 (ref 0.0–1.0)
pH: 6 (ref 5.0–8.0)

## 2021-07-24 LAB — BASIC METABOLIC PANEL
BUN: 12 mg/dL (ref 6–23)
CO2: 29 mEq/L (ref 19–32)
Calcium: 9.7 mg/dL (ref 8.4–10.5)
Chloride: 100 mEq/L (ref 96–112)
Creatinine, Ser: 0.88 mg/dL (ref 0.40–1.20)
GFR: 68.75 mL/min (ref >60.00)
Glucose, Bld: 94 mg/dL (ref 70–99)
Potassium: 3.4 mEq/L — ABNORMAL LOW (ref 3.5–5.1)
Sodium: 137 mEq/L (ref 135–145)

## 2021-07-24 LAB — CBC WITH DIFFERENTIAL/PLATELET
Basophils Absolute: 0 10*3/uL (ref 0.0–0.1)
Basophils Relative: 0.6 % (ref 0.0–3.0)
Eosinophils Absolute: 0.2 10*3/uL (ref 0.0–0.7)
Eosinophils Relative: 2.2 % (ref 0.0–5.0)
HCT: 38.8 % (ref 36.0–46.0)
Hemoglobin: 12.5 g/dL (ref 12.0–15.0)
Lymphocytes Relative: 19.2 % (ref 12.0–46.0)
Lymphs Abs: 1.7 10*3/uL (ref 0.7–4.0)
MCHC: 32.3 g/dL (ref 30.0–36.0)
MCV: 87.2 fl (ref 78.0–100.0)
Monocytes Absolute: 0.5 10*3/uL (ref 0.1–1.0)
Monocytes Relative: 5.9 % (ref 3.0–12.0)
Neutro Abs: 6.3 10*3/uL (ref 1.4–7.7)
Neutrophils Relative %: 72.1 % (ref 43.0–77.0)
Platelets: 323 10*3/uL (ref 150.0–400.0)
RBC: 4.45 Mil/uL (ref 3.87–5.11)
RDW: 15.7 % — ABNORMAL HIGH (ref 11.5–15.5)
WBC: 8.7 10*3/uL (ref 4.0–10.5)

## 2021-07-24 LAB — TSH: TSH: 2.31 u[IU]/mL (ref 0.35–5.50)

## 2021-07-24 MED ORDER — LINZESS 290 MCG PO CAPS: 290.0000 ug | ORAL_CAPSULE | Freq: Every morning | ORAL | 1 refills | Status: DC

## 2021-07-24 MED ORDER — BUPROPION HCL ER (XL) 150 MG PO TB24: 150.0000 mg | ORAL_TABLET | Freq: Every day | ORAL | 1 refills | Status: DC

## 2021-07-24 NOTE — Progress Notes (Signed)
Subjective:  Patient ID: [name], female    DOB: [birth year]  Age: 66 y.o. MRN: [id number]  CC: Anemia and Depression   HPI Runa Whittingham presents for f/up -  She complains of a 15-month history of tremors in her chin and hands.  This started after she increased the dose of Wellbutrin.  Outpatient Medications Prior to Visit  Medication Sig Dispense Refill   ARIPiprazole (ABILIFY) 2 MG tablet TAKE 1 TABLET BY MOUTH EVERY DAY 90 tablet 1   DULoxetine (CYMBALTA) 60 MG capsule TAKE 1 CAPSULE BY MOUTH EVERY DAY 90 capsule 1   esomeprazole (NEXIUM) 40 MG capsule TAKE 1 CAPSULE (40 MG TOTAL) BY MOUTH DAILY AT 12 NOON. 90 capsule 1   Ferric Maltol (ACCRUFER) 30 MG CAPS Take 1 capsule by mouth 2 (two) times daily. 60 capsule 2   KLOR-CON M10 10 MEQ tablet TAKE 1 TABLET (10 MEQ TOTAL) BY MOUTH 3 (THREE) TIMES DAILY. 270 tablet 0   thiamine 100 MG tablet Take 1 tablet (100 mg total) by mouth daily. 90 tablet 1   triamterene-hydrochlorothiazide (MAXZIDE-25) 37.5-25 MG tablet TAKE 1 TABLET BY MOUTH EVERY DAY 90 tablet 0   Vitamin D, Ergocalciferol, (DRISDOL) 1.25 MG (50000 UNIT) CAPS capsule TAKE 1 CAPSULE (50,000 UNITS TOTAL) BY MOUTH EVERY 7 (SEVEN) DAYS 12 capsule 0   zinc gluconate ([employer] ZINC GLUCONATE) 50 MG tablet Take 1 tablet (50 mg total) by mouth every other day. 45 tablet 1   buPROPion (WELLBUTRIN XL) 300 MG 24 hr tablet Take 1 tablet (300 mg total) by mouth daily. 90 tablet 1   LINZESS 290 MCG CAPS capsule Take 1 capsule (290 mcg total) by mouth every morning. 90 capsule 1   Semaglutide-Weight Management (WEGOVY) 0.5 MG/0.5ML SOAJ Inject 0.5 mg into the skin once a week. 2 mL 0   Semaglutide-Weight Management 1 MG/0.5ML SOAJ Inject 1 mg into the skin once a week for 28 days. 2 mL 0   [START ON 08/16/2021] Semaglutide-Weight Management 1.7 MG/0.75ML SOAJ Inject 1.7 mg into the skin once a week for 28 days. 3 mL 0   [START ON 09/14/2021] Semaglutide-Weight Management 2.4 MG/0.75ML SOAJ  Inject 2.4 mg into the skin once a week for 28 days. 3 mL 0   No facility-administered medications prior to visit.    ROS Review of Systems  Constitutional:  Negative for chills, diaphoresis, fatigue and fever.  HENT: Negative.    Eyes: Negative.   Respiratory:  Negative for cough, chest tightness, shortness of breath and wheezing.   Cardiovascular:  Negative for chest pain, palpitations and leg swelling.  Gastrointestinal:  Positive for constipation. Negative for abdominal pain, diarrhea, nausea and vomiting.  Endocrine: Negative.   Genitourinary: Negative.  Negative for difficulty urinating and dysuria.  Musculoskeletal:  Positive for arthralgias. Negative for myalgias.  Skin: Negative.   Neurological:  Positive for tremors. Negative for dizziness, facial asymmetry and headaches.  Hematological:  Negative for adenopathy. Does not bruise/bleed easily.  Psychiatric/Behavioral: Negative.      Objective:  BP 118/70 (BP Location: Right Arm, Patient Position: Sitting, Cuff Size: Large)   Pulse 91   Temp 98.3 F (36.8 C) (Oral)   Ht 5\' 3"  (1.6 m)   Wt 246 lb (111.6 kg)   SpO2 93%   BMI 43.58 kg/m   BP Readings from Last 3 Encounters:  07/24/21 118/70  07/16/21 130/84  05/21/21 128/80    Wt Readings from Last 3 Encounters:  07/24/21 246 lb (111.6 kg)  07/16/21 248 lb 6.4 oz (112.7 kg)  05/21/21 248 lb (112.5 kg)    Physical Exam Vitals reviewed.  Constitutional:      Appearance: She is diaphoretic. She is not ill-appearing.  HENT:     Nose: Nose normal.     Mouth/Throat:     Mouth: Mucous membranes are moist.  Eyes:     General: No scleral icterus.    Conjunctiva/sclera: Conjunctivae normal.  Cardiovascular:     Rate and Rhythm: Normal rate and regular rhythm.     Heart sounds: No murmur heard. Pulmonary:     Breath sounds: No stridor. No wheezing, rhonchi or rales.  Abdominal:     General: Abdomen is protuberant. Bowel sounds are normal. There is no  distension.     Palpations: Abdomen is soft. There is no hepatomegaly, splenomegaly or mass.     Tenderness: There is no abdominal tenderness.  Musculoskeletal:        General: Normal range of motion.     Cervical back: Neck supple.     Right lower leg: No edema.     Left lower leg: No edema.  Lymphadenopathy:     Cervical: No cervical adenopathy.  Skin:    General: Skin is warm.  Neurological:     General: No focal deficit present.     Mental Status: She is alert. Mental status is at baseline.  Psychiatric:        Mood and Affect: Mood normal.        Behavior: Behavior normal.     Lab Results  Component Value Date   WBC 8.7 07/24/2021   HGB 12.5 07/24/2021   HCT 38.8 07/24/2021   PLT 323.0 07/24/2021   GLUCOSE 94 07/24/2021   CHOL 181 08/29/2020   TRIG 118.0 08/29/2020   HDL 53.20 08/29/2020   LDLCALC 104 (H) 08/29/2020   ALT 17 08/29/2020   AST 17 08/29/2020   NA 137 07/24/2021   K 3.4 (L) 07/24/2021   CL 100 07/24/2021   CREATININE 0.88 07/24/2021   BUN 12 07/24/2021   CO2 29 07/24/2021   TSH 2.31 07/24/2021   HGBA1C 5.8 08/29/2020    CT ABDOMEN PELVIS W CONTRAST  Result Date: 07/14/2019 CLINICAL DATA:  Abdominal pain and nausea EXAM: CT ABDOMEN AND PELVIS WITH CONTRAST TECHNIQUE: Multidetector CT imaging of the abdomen and pelvis was performed using the standard protocol following bolus administration of intravenous contrast. CONTRAST:  OMNIPAQUE IOHEXOL 300 MG/ML  SOLN COMPARISON:  02/20/2015 FINDINGS: LOWER CHEST: Normal. HEPATOBILIARY: Normal hepatic contours. No intra- or extrahepatic biliary dilatation. Status post cholecystectomy. PANCREAS: Normal pancreas. No ductal dilatation or peripancreatic fluid collection. SPLEEN: Normal. ADRENALS/URINARY TRACT: The adrenal glands are normal. No hydronephrosis, nephroureterolithiasis or solid renal mass. The urinary bladder is normal for degree of distention STOMACH/BOWEL: There is no hiatal hernia. Normal duodenal  course and caliber. No small bowel dilatation or inflammation. There is sigmoid and distal descending colon diverticulosis with an area acute inflammation extending from the distal descending colon into the proximal sigmoid colon. There is no fluid collection or free intraperitoneal air. The appendix is not visualized. No right lower quadrant inflammation or free fluid. VASCULAR/LYMPHATIC: There is calcific atherosclerosis of the abdominal aorta. No lymphadenopathy. REPRODUCTIVE: Normal prostate size with symmetric seminal vesicles. MUSCULOSKELETAL. No bony spinal canal stenosis or focal osseous abnormality. OTHER: None. IMPRESSION: 1. Acute diverticulitis of the distal descending colon and proximal sigmoid colon without abscess or free intraperitoneal air. 2. Aortic Atherosclerosis (ICD10-I70.0). Electronically  Signed   By: Ulyses Jarred M.D.   On: 07/14/2019 01:15    Assessment & Plan:   Markeitha was seen today for anemia and depression.  Diagnoses and all orders for this visit:  Coarse tremors- She is euthyroid.  Will decrease the dose of bupropion. -     TSH; Future -     TSH  Essential hypertension- Her blood pressure is adequately well controlled. -     Basic metabolic panel; Future -     CBC with Differential/Platelet; Future -     TSH; Future -     Urinalysis, Routine w reflex microscopic; Future -     Urinalysis, Routine w reflex microscopic -     TSH -     CBC with Differential/Platelet -     Basic metabolic panel  Stage 3a chronic kidney disease ([place])- Her renal function is normal now. -     CBC with Differential/Platelet; Future -     Urinalysis, Routine w reflex microscopic; Future -     Urinalysis, Routine w reflex microscopic -     CBC with Differential/Platelet  Moderate episode of recurrent major depressive disorder (HCC) -     TSH; Future -     buPROPion (WELLBUTRIN XL) 150 MG 24 hr tablet; Take 1 tablet (150 mg total) by mouth daily. -     TSH  Chronic idiopathic  constipation -     LINZESS 290 MCG CAPS capsule; Take 1 capsule (290 mcg total) by mouth every morning.  Insulin resistance syndrome -     Semaglutide,0.25 or 0.5MG /DOS, (OZEMPIC, 0.25 OR 0.5 MG/DOSE,) 2 MG/3ML SOPN; Inject 0.5 mg into the skin once a week. -     Insulin Pen Needle 32G X 6 MM MISC; 1 Act by Does not apply route once a week.   I have discontinued [facility] Management, Semaglutide-Weight Management, buPROPion, Wegovy, and Semaglutide-Weight Management. I am also having her start on buPROPion, Ozempic (0.25 or 0.5 MG/DOSE), and Insulin Pen Needle. Additionally, I am having her maintain her zinc gluconate, thiamine, ARIPiprazole, DULoxetine, esomeprazole, ACCRUFeR, triamterene-hydrochlorothiazide, Vitamin D (Ergocalciferol), Klor-Con M10, and Linzess.  Meds ordered this encounter  Medications   buPROPion (WELLBUTRIN XL) 150 MG 24 hr tablet    Sig: Take 1 tablet (150 mg total) by mouth daily.    Dispense:  90 tablet    Refill:  1   LINZESS 290 MCG CAPS capsule    Sig: Take 1 capsule (290 mcg total) by mouth every morning.    Dispense:  90 capsule    Refill:  1   Semaglutide,0.25 or 0.5MG /DOS, (OZEMPIC, 0.25 OR 0.5 MG/DOSE,) 2 MG/3ML SOPN    Sig: Inject 0.5 mg into the skin once a week.    Dispense:  3 mL    Refill:  0   Insulin Pen Needle 32G X 6 MM MISC    Sig: 1 Act by Does not apply route once a week.    Dispense:  30 each    Refill:  0     Follow-up: Return in about 6 months (around 01/24/2022).  Scarlette Calico, MD

## 2021-07-24 NOTE — Patient Instructions (Signed)
Major Depressive Disorder, Adult Major depressive disorder (MDD) is a mental health condition. It may also be called clinical depression or unipolar depression. MDD causes symptoms of sadness, hopelessness, and loss of interest in things. These symptoms last most of the day, almost every day, for 2 weeks. MDD can also cause physical symptoms. It can interfere with relationships and with everyday activities, such as work, school, and activities that are usually pleasant. MDD may be mild, moderate, or severe. It may be single-episode MDD, which happens once, or recurrent MDD, which may occur multiple times. What are the causes? The exact cause of this condition is not known. MDD is most likely caused by a combination of things, which may include: Your personality traits. Learned or conditioned behaviors or thoughts or feelings that reinforce negativity. Any alcohol or substance misuse. Long-term (chronic) physical or mental health illness. Going through a traumatic experience or major life changes. What increases the risk? The following factors may make someone more likely to develop MDD: A family history of depression. Being a woman. Troubled family relationships. Abnormally low levels of certain brain chemicals. Traumatic or painful events in childhood, especially abuse or loss of a parent. A lot of stress from life experiences, such as poor living conditions or discrimination. Chronic physical illness or other mental health disorders. What are the signs or symptoms? The main symptoms of MDD usually include: Constant depressed or irritable mood. A loss of interest in things and activities. Other symptoms include: Sleeping or eating too much or too little. Unexplained weight gain or weight loss. Tiredness or low energy. Being agitated, restless, or weak. Feeling hopeless, worthless, or guilty. Trouble thinking clearly or making decisions. Thoughts of suicide or thoughts of harming  others. Isolating oneself or avoiding other people or activities. Trouble completing tasks, work, or any normal obligations. Severe symptoms of this condition may include: Psychotic depression.This may include false beliefs, or delusions. It may also include seeing, hearing, tasting, smelling, or feeling things that are not real (hallucinations). Chronic depression or persistent depressive disorder. This is low-level depression that lasts for at least 2 years. Melancholic depression, or feeling extremely sad and hopeless. Catatonic depression, which includes trouble speaking and trouble moving. How is this diagnosed? This condition may be diagnosed based on: Your symptoms. Your medical and mental health history. You may be asked questions about your lifestyle, including any drug and alcohol use. A physical exam. Blood tests to rule out other conditions. MDD is confirmed if you have the following symptoms most of the day, nearly every day, in a 2-week period: Either a depressed mood or loss of interest. At least four other MDD symptoms. How is this treated? This condition is usually treated by mental health professionals, such as psychologists, psychiatrists, and clinical social workers. You may need more than one type of treatment. Treatment may include: Psychotherapy, also called talk therapy or counseling. Types of psychotherapy include: Cognitive behavioral therapy (CBT). This teaches you to recognize unhealthy feelings, thoughts, and behaviors, and replace them with positive thoughts and actions. Interpersonal therapy (IPT). This helps you to improve the way you communicate with others or relate to them. Family therapy. This treatment includes members of your family. Medicines to treat anxiety and depression. These medicines help to balance the brain chemicals that affect your emotions. Lifestyle changes. You may be asked to: Limit alcohol use and avoid drug use. Get regular  exercise. Get plenty of sleep. Make healthy eating choices. Spend more time outdoors. Brain stimulation. This may   be done if symptoms are very severe and other treatments have not worked. Examples of this treatment are electroconvulsive therapy and transcranial magnetic stimulation. Follow these instructions at home: Activity Exercise regularly and spend time outdoors. Find activities that you enjoy doing, and make time to do them. Find healthy ways to manage stress, such as: Meditation or deep breathing. Spending time in nature. Journaling. Return to your normal activities as told by your health care provider. Ask your health care provider what activities are safe for you. Alcohol and drug use If you drink alcohol: Limit how much you use to: 0-1 drink a day for women who are not pregnant. 0-2 drinks a day for men. Be aware of how much alcohol is in your drink. In the [place] one drink equals one 12 oz bottle of beer (355 mL), one 5 oz glass of wine (148 mL), or one 1 oz glass of hard liquor (44 mL). Discuss your alcohol use with your health care provider. Alcohol can affect any antidepressant medicines you are taking. Discuss any drug use with your health care provider. General instructions  Take over-the-counter and prescription medicines only as told by your health care provider. Eat a healthy diet and get plenty of sleep. Consider joining a support group. Your health care provider may be able to recommend one. Keep all follow-up visits as told by your health care provider. This is important. Where to find more information National Alliance on Mental Illness: www.nami.org [place] National Institute of Mental Health: www.nimh.nih.gov Contact a health care provider if: Your symptoms get worse. You develop new symptoms. Get help right away if: You self-harm. You have serious thoughts about hurting yourself or others. You hallucinate. If you ever feel like you may hurt yourself or  others, or have thoughts about taking your own life, get help right away. Go to your nearest emergency department or: Call your local emergency services (911 in the [place] Call a suicide crisis helpline, such as the National Suicide Prevention Lifeline at 1-800-273-8255 or 988 in the [place] This is open 24 hours a day in the [place] Text the Crisis Text Line at 741741 (in the [place] Summary Major depressive disorder (MDD) is a mental health condition. MDD causes symptoms of sadness, hopelessness, and loss of interest in things. These symptoms last most of the day, almost every day, for 2 weeks. The symptoms of MDD can interfere with relationships and with everyday activities. Treatments and support are available for people who develop MDD. You may need more than one type of treatment. Get help right away if you have serious thoughts about hurting yourself or others. This information is not intended to replace advice given to you by your health care provider. Make sure you discuss any questions you have with your health care provider. Document Revised: 07/18/2020 Document Reviewed: 12/04/2018 Elsevier Patient Education  2023 Elsevier Inc.  

## 2021-07-28 ENCOUNTER — Encounter: Payer: Self-pay | Admitting: Internal Medicine

## 2021-07-29 DIAGNOSIS — E8881 Metabolic syndrome: Secondary | ICD-10-CM | POA: Insufficient documentation

## 2021-07-29 MED ORDER — INSULIN PEN NEEDLE 32G X 6 MM MISC: 1.0000 | 0 refills | Status: DC

## 2021-07-29 MED ORDER — OZEMPIC (0.25 OR 0.5 MG/DOSE) 2 MG/3ML ~~LOC~~ SOPN: 0.5000 mg | PEN_INJECTOR | SUBCUTANEOUS | 0 refills | Status: DC

## 2021-07-31 ENCOUNTER — Ambulatory Visit (HOSPITAL_COMMUNITY): Payer: 59

## 2021-08-02 ENCOUNTER — Telehealth: Payer: Self-pay

## 2021-08-02 NOTE — Telephone Encounter (Signed)
Per CoverMyMeds: ? ?PA was denied.  ?

## 2021-08-02 NOTE — Telephone Encounter (Signed)
Key: Richardson Landry

## 2021-08-07 ENCOUNTER — Other Ambulatory Visit: Payer: Self-pay | Admitting: Internal Medicine

## 2021-08-07 DIAGNOSIS — F331 Major depressive disorder, recurrent, moderate: Secondary | ICD-10-CM

## 2021-08-08 ENCOUNTER — Other Ambulatory Visit: Payer: Self-pay | Admitting: Internal Medicine

## 2021-08-08 DIAGNOSIS — I10 Essential (primary) hypertension: Secondary | ICD-10-CM

## 2021-08-17 ENCOUNTER — Other Ambulatory Visit: Payer: Self-pay | Admitting: Internal Medicine

## 2021-08-17 DIAGNOSIS — Z9884 Bariatric surgery status: Secondary | ICD-10-CM

## 2021-08-17 DIAGNOSIS — K2100 Gastro-esophageal reflux disease with esophagitis, without bleeding: Secondary | ICD-10-CM

## 2021-08-17 DIAGNOSIS — E519 Thiamine deficiency, unspecified: Secondary | ICD-10-CM

## 2021-08-17 DIAGNOSIS — F418 Other specified anxiety disorders: Secondary | ICD-10-CM

## 2021-08-22 ENCOUNTER — Other Ambulatory Visit: Payer: Self-pay | Admitting: Internal Medicine

## 2021-08-22 DIAGNOSIS — E559 Vitamin D deficiency, unspecified: Secondary | ICD-10-CM

## 2021-09-06 ENCOUNTER — Ambulatory Visit
Admission: RE | Admit: 2021-09-06 | Discharge: 2021-09-06 | Disposition: A | Discharge status: Home / Self Care | Payer: 59 | Source: Ambulatory Visit | Attending: Pulmonary Disease | Admitting: Pulmonary Disease

## 2021-09-06 DIAGNOSIS — R918 Other nonspecific abnormal finding of lung field: Secondary | ICD-10-CM | POA: Diagnosis not present

## 2021-09-06 DIAGNOSIS — R911 Solitary pulmonary nodule: Secondary | ICD-10-CM | POA: Diagnosis not present

## 2021-09-12 ENCOUNTER — Other Ambulatory Visit: Payer: Self-pay

## 2021-09-12 DIAGNOSIS — E611 Iron deficiency: Secondary | ICD-10-CM

## 2021-09-12 MED ORDER — ACCRUFER 30 MG PO CAPS: 1.0000 | ORAL_CAPSULE | Freq: Two times a day (BID) | ORAL | 2 refills | Status: DC

## 2021-09-13 ENCOUNTER — Other Ambulatory Visit: Payer: Self-pay | Admitting: Nurse Practitioner

## 2021-09-13 DIAGNOSIS — E611 Iron deficiency: Secondary | ICD-10-CM

## 2021-09-13 MED ORDER — ACCRUFER 30 MG PO CAPS: 1.0000 | ORAL_CAPSULE | Freq: Two times a day (BID) | ORAL | 0 refills | Status: DC

## 2021-09-20 DIAGNOSIS — N3946 Mixed incontinence: Secondary | ICD-10-CM | POA: Diagnosis not present

## 2021-09-27 ENCOUNTER — Other Ambulatory Visit: Payer: Self-pay | Admitting: Internal Medicine

## 2021-09-27 DIAGNOSIS — E876 Hypokalemia: Secondary | ICD-10-CM

## 2021-09-27 DIAGNOSIS — I10 Essential (primary) hypertension: Secondary | ICD-10-CM

## 2021-10-28 ENCOUNTER — Ambulatory Visit (INDEPENDENT_AMBULATORY_CARE_PROVIDER_SITE_OTHER): Payer: 59 | Admitting: Internal Medicine

## 2021-10-28 ENCOUNTER — Encounter: Payer: Self-pay | Admitting: Internal Medicine

## 2021-10-28 VITALS — BP 120/80 | HR 83 | Temp 98.3°F | Resp 16 | Ht 63.0 in | Wt 252.2 lb

## 2021-10-28 DIAGNOSIS — F331 Major depressive disorder, recurrent, moderate: Secondary | ICD-10-CM

## 2021-10-28 DIAGNOSIS — R69 Illness, unspecified: Secondary | ICD-10-CM | POA: Diagnosis not present

## 2021-10-28 DIAGNOSIS — L2084 Intrinsic (allergic) eczema: Secondary | ICD-10-CM | POA: Diagnosis not present

## 2021-10-28 DIAGNOSIS — I10 Essential (primary) hypertension: Secondary | ICD-10-CM | POA: Diagnosis not present

## 2021-10-28 MED ORDER — FLUOCINONIDE EMULSIFIED BASE 0.05 % EX CREA: 1.0000 | TOPICAL_CREAM | Freq: Two times a day (BID) | CUTANEOUS | 1 refills | Status: DC

## 2021-10-28 NOTE — Patient Instructions (Signed)
Hypertension, Adult High blood pressure (hypertension) is when the force of blood pumping through the arteries is too strong. The arteries are the blood vessels that carry blood from the heart throughout the body. Hypertension forces the heart to work harder to pump blood and may cause arteries to become narrow or stiff. Untreated or uncontrolled hypertension can lead to a heart attack, heart failure, a stroke, kidney disease, and other problems. A blood pressure reading consists of a higher number over a lower number. Ideally, your blood pressure should be below 120/80. The first ("top") number is called the systolic pressure. It is a measure of the pressure in your arteries as your heart beats. The second ("bottom") number is called the diastolic pressure. It is a measure of the pressure in your arteries as the heart relaxes. What are the causes? The exact cause of this condition is not known. There are some conditions that result in high blood pressure. What increases the risk? Certain factors may make you more likely to develop high blood pressure. Some of these risk factors are under your control, including: Smoking. Not getting enough exercise or physical activity. Being overweight. Having too much fat, sugar, calories, or salt (sodium) in your diet. Drinking too much alcohol. Other risk factors include: Having a personal history of heart disease, diabetes, high cholesterol, or kidney disease. Stress. Having a family history of high blood pressure and high cholesterol. Having obstructive sleep apnea. Age. The risk increases with age. What are the signs or symptoms? High blood pressure may not cause symptoms. Very high blood pressure (hypertensive crisis) may cause: Headache. Fast or irregular heartbeats (palpitations). Shortness of breath. Nosebleed. Nausea and vomiting. Vision changes. Severe chest pain, dizziness, and seizures. How is this diagnosed? This condition is diagnosed by  measuring your blood pressure while you are seated, with your arm resting on a flat surface, your legs uncrossed, and your feet flat on the floor. The cuff of the blood pressure monitor will be placed directly against the skin of your upper arm at the level of your heart. Blood pressure should be measured at least twice using the same arm. Certain conditions can cause a difference in blood pressure between your right and left arms. If you have a high blood pressure reading during one visit or you have normal blood pressure with other risk factors, you may be asked to: Return on a different day to have your blood pressure checked again. Monitor your blood pressure at home for 1 week or longer. If you are diagnosed with hypertension, you may have other blood or imaging tests to help your health care provider understand your overall risk for other conditions. How is this treated? This condition is treated by making healthy lifestyle changes, such as eating healthy foods, exercising more, and reducing your alcohol intake. You may be referred for counseling on a healthy diet and physical activity. Your health care provider may prescribe medicine if lifestyle changes are not enough to get your blood pressure under control and if: Your systolic blood pressure is above 130. Your diastolic blood pressure is above 80. Your personal target blood pressure may vary depending on your medical conditions, your age, and other factors. Follow these instructions at home: Eating and drinking  Eat a diet that is high in fiber and potassium, and low in sodium, added sugar, and fat. An example of this eating plan is called the DASH diet. DASH stands for Dietary Approaches to Stop Hypertension. To eat this way: Eat   plenty of fresh fruits and vegetables. Try to fill one half of your plate at each meal with fruits and vegetables. Eat whole grains, such as whole-wheat pasta, brown rice, or whole-grain bread. Fill about one  fourth of your plate with whole grains. Eat or drink low-fat dairy products, such as skim milk or low-fat yogurt. Avoid fatty cuts of meat, processed or cured meats, and poultry with skin. Fill about one fourth of your plate with lean proteins, such as fish, chicken without skin, beans, eggs, or tofu. Avoid pre-made and processed foods. These tend to be higher in sodium, added sugar, and fat. Reduce your daily sodium intake. Many people with hypertension should eat less than 1,500 mg of sodium a day. Do not drink alcohol if: Your health care provider tells you not to drink. You are pregnant, may be pregnant, or are planning to become pregnant. If you drink alcohol: Limit how much you have to: 0-1 drink a day for women. 0-2 drinks a day for men. Know how much alcohol is in your drink. In the [place] one drink equals one 12 oz bottle of beer (355 mL), one 5 oz glass of wine (148 mL), or one 1 oz glass of hard liquor (44 mL). Lifestyle  Work with your health care provider to maintain a healthy body weight or to lose weight. Ask what an ideal weight is for you. Get at least 30 minutes of exercise that causes your heart to beat faster (aerobic exercise) most days of the week. Activities may include walking, swimming, or biking. Include exercise to strengthen your muscles (resistance exercise), such as Pilates or lifting weights, as part of your weekly exercise routine. Try to do these types of exercises for 30 minutes at least 3 days a week. Do not use any products that contain nicotine or tobacco. These products include cigarettes, chewing tobacco, and vaping devices, such as e-cigarettes. If you need help quitting, ask your health care provider. Monitor your blood pressure at home as told by your health care provider. Keep all follow-up visits. This is important. Medicines Take over-the-counter and prescription medicines only as told by your health care provider. Follow directions carefully. Blood  pressure medicines must be taken as prescribed. Do not skip doses of blood pressure medicine. Doing this puts you at risk for problems and can make the medicine less effective. Ask your health care provider about side effects or reactions to medicines that you should watch for. Contact a health care provider if you: Think you are having a reaction to a medicine you are taking. Have headaches that keep coming back (recurring). Feel dizzy. Have swelling in your ankles. Have trouble with your vision. Get help right away if you: Develop a severe headache or confusion. Have unusual weakness or numbness. Feel faint. Have severe pain in your chest or abdomen. Vomit repeatedly. Have trouble breathing. These symptoms may be an emergency. Get help right away. Call 911. Do not wait to see if the symptoms will go away. Do not drive yourself to the hospital. Summary Hypertension is when the force of blood pumping through your arteries is too strong. If this condition is not controlled, it may put you at risk for serious complications. Your personal target blood pressure may vary depending on your medical conditions, your age, and other factors. For most people, a normal blood pressure is less than 120/80. Hypertension is treated with lifestyle changes, medicines, or a combination of both. Lifestyle changes include losing weight, eating a healthy,   low-sodium diet, exercising more, and limiting alcohol. This information is not intended to replace advice given to you by your health care provider. Make sure you discuss any questions you have with your health care provider. Document Revised: 10/30/2020 Document Reviewed: 10/30/2020 Elsevier Patient Education  2023 Elsevier Inc.  

## 2021-10-28 NOTE — Progress Notes (Signed)
Subjective:  Patient ID: [name], female    DOB: [birth year]  Age: 66 y.o. MRN: [id number]  CC: Rash and Hypertension   HPI Tayla Panozzo presents for f/up -  She complains of a several month history of itchy rash around her neck.  She is frustrated that she cannot lose weight and would like to taper off of Abilify.  She recently saw a urologist for urinary urgency.  She tried Myrbetriq but her blood pressure went up.  She was told to try to stop taking the diuretics.  Outpatient Medications Prior to Visit  Medication Sig Dispense Refill   buPROPion (WELLBUTRIN XL) 150 MG 24 hr tablet Take 1 tablet (150 mg total) by mouth daily. 90 tablet 1   CVS B-1 100 MG tablet TAKE 1 TABLET BY MOUTH EVERY DAY 90 tablet 1   DULoxetine (CYMBALTA) 60 MG capsule TAKE 1 CAPSULE BY MOUTH EVERY DAY 90 capsule 1   esomeprazole (NEXIUM) 40 MG capsule TAKE 1 CAPSULE (40 MG TOTAL) BY MOUTH DAILY AT 12 NOON. 90 capsule 1   Ferric Maltol (ACCRUFER) 30 MG CAPS Take 1 capsule by mouth 2 (two) times daily. 60 capsule 0   Insulin Pen Needle 32G X 6 MM MISC 1 Act by Does not apply route once a week. 30 each 0   KLOR-CON M10 10 MEQ tablet TAKE 1 TABLET (10 MEQ TOTAL) BY MOUTH 3 (THREE) TIMES DAILY. 270 tablet 0   LINZESS 290 MCG CAPS capsule Take 1 capsule (290 mcg total) by mouth every morning. 90 capsule 1   Semaglutide,0.25 or 0.5MG /DOS, (OZEMPIC, 0.25 OR 0.5 MG/DOSE,) 2 MG/3ML SOPN Inject 0.5 mg into the skin once a week. 3 mL 0   Vitamin D, Ergocalciferol, (DRISDOL) 1.25 MG (50000 UNIT) CAPS capsule TAKE 1 CAPSULE (50,000 UNITS TOTAL) BY MOUTH EVERY 7 (SEVEN) DAYS 12 capsule 0   zinc gluconate (CVS ZINC GLUCONATE) 50 MG tablet Take 1 tablet (50 mg total) by mouth every other day. 45 tablet 1   ARIPiprazole (ABILIFY) 2 MG tablet TAKE 1 TABLET BY MOUTH EVERY DAY 90 tablet 1   triamterene-hydrochlorothiazide (MAXZIDE-25) 37.5-25 MG tablet TAKE 1 TABLET BY MOUTH EVERY DAY 90 tablet 0   No facility-administered  medications prior to visit.    ROS Review of Systems  Constitutional:  Negative for chills, diaphoresis, fatigue and fever.  HENT: Negative.    Eyes: Negative.   Respiratory:  Negative for cough, chest tightness and shortness of breath.   Cardiovascular:  Negative for chest pain, palpitations and leg swelling.  Gastrointestinal:  Negative for abdominal pain, constipation, diarrhea, nausea and vomiting.  Genitourinary:  Positive for frequency. Negative for difficulty urinating and urgency.  Musculoskeletal: Negative.   Skin:  Positive for rash.  Neurological:  Negative for dizziness, weakness and light-headedness.  Hematological:  Negative for adenopathy. Does not bruise/bleed easily.  Psychiatric/Behavioral: Negative.      Objective:  BP 120/80 (BP Location: Right Arm, Patient Position: Sitting, Cuff Size: Normal)   Pulse 83   Temp 98.3 F (36.8 C) (Oral)   Resp 16   Ht 5\' 3"  (1.6 m)   Wt 252 lb 4 oz (114.4 kg)   SpO2 97%   BMI 44.68 kg/m   BP Readings from Last 3 Encounters:  10/28/21 120/80  07/24/21 118/70  07/16/21 130/84    Wt Readings from Last 3 Encounters:  10/28/21 252 lb 4 oz (114.4 kg)  07/24/21 246 lb (111.6 kg)  07/16/21 248 lb 6.4 oz (112.7  kg)    Physical Exam Vitals reviewed.  HENT:     Nose: Nose normal.     Mouth/Throat:     Mouth: Mucous membranes are moist.  Eyes:     General: No scleral icterus.    Conjunctiva/sclera: Conjunctivae normal.  Cardiovascular:     Rate and Rhythm: Normal rate and regular rhythm.     Heart sounds: No murmur heard. Pulmonary:     Effort: Pulmonary effort is normal.     Breath sounds: No stridor. No wheezing, rhonchi or rales.  Abdominal:     General: Abdomen is flat.     Palpations: There is no mass.     Tenderness: There is no abdominal tenderness. There is no guarding.     Hernia: No hernia is present.  Musculoskeletal:        General: Normal range of motion.     Cervical back: Neck supple.     Right  lower leg: No edema.     Left lower leg: No edema.  Lymphadenopathy:     Cervical: No cervical adenopathy.  Skin:    Findings: Rash present. Rash is papular and scaling. Rash is not crusting, macular, nodular or vesicular.     Comments: Circumferentially around the neck there are large swaths of coalesced papules with scale and hyperpigmentation.  Neurological:     General: No focal deficit present.     Mental Status: She is alert.  Psychiatric:        Mood and Affect: Mood normal.        Behavior: Behavior normal.     Lab Results  Component Value Date   WBC 8.7 07/24/2021   HGB 12.5 07/24/2021   HCT 38.8 07/24/2021   PLT 323.0 07/24/2021   GLUCOSE 94 07/24/2021   CHOL 181 08/29/2020   TRIG 118.0 08/29/2020   HDL 53.20 08/29/2020   LDLCALC 104 (H) 08/29/2020   ALT 17 08/29/2020   AST 17 08/29/2020   NA 137 07/24/2021   K 3.4 (L) 07/24/2021   CL 100 07/24/2021   CREATININE 0.88 07/24/2021   BUN 12 07/24/2021   CO2 29 07/24/2021   TSH 2.31 07/24/2021   HGBA1C 5.8 08/29/2020    CT Chest Wo Contrast  Result Date: 09/08/2021 CLINICAL DATA:  follow on lung nodules EXAM: CT CHEST WITHOUT CONTRAST TECHNIQUE: Multidetector CT imaging of the chest was performed following the standard protocol without IV contrast. RADIATION DOSE REDUCTION: This exam was performed according to the departmental dose-optimization program which includes automated exposure control, adjustment of the mA and/or kV according to patient size and/or use of iterative reconstruction technique. COMPARISON:  CT chest 02/22/2018, CT abdomen pelvis 07/14/2019 FINDINGS: Cardiovascular: Normal heart size. No significant pericardial effusion. The thoracic aorta is normal in caliber. No atherosclerotic plaque of the thoracic aorta. No coronary artery calcifications. The main pulmonary artery measures at the upper limits of normal. Mediastinum/Nodes: No enlarged mediastinal or axillary lymph nodes. Thyroid gland, trachea,  and esophagus demonstrate no significant findings. Lungs/Pleura: No focal consolidation. Slightly more conspicuous stable in size bilateral scattered pulmonary nodules measuring up to 8 mm (8:39, 56, 60, 73, 80, 83, 85, 94, 123). No new pulmonary nodule. No pulmonary mass. No pleural effusion. No pneumothorax. Upper Abdomen: Gastric lap band grossly appropriately positioned with a phi angle of 51 degrees. Continuous tubing of the gastric lap band noted without kinking or discontinuity. Status post cholecystectomy. No acute abnormality. Musculoskeletal: No chest wall abnormality. No suspicious lytic or blastic osseous  lesions. No acute displaced fracture. Multilevel degenerative changes of the spine. IMPRESSION: 1. Stable scattered pulmonary nodules measuring up to 8 mm. No new pulmonary nodule. No further follow-up indicated. 2. Gastric lap band grossly appropriately positioned. Electronically Signed   By: Tish Frederickson M.D.   On: 09/08/2021 17:47    Assessment & Plan:   Shalin was seen today for rash and hypertension.  Diagnoses and all orders for this visit:  Intrinsic eczema -     fluocinonide-emollient (LIDEX-E) 0.05 % cream; Apply 1 Application topically 2 (two) times daily.  Essential hypertension- Her blood pressure is adequately well controlled.  Will see how she does off of the diuretics.  Moderate episode of recurrent major depressive disorder (HCC- I instructed her on how to slowly taper off of Abilify.   I have discontinued Shatia Tuite's ARIPiprazole and triamterene-hydrochlorothiazide. I am also having her start on fluocinonide-emollient. Additionally, I am having her maintain her zinc gluconate, buPROPion, Linzess, Ozempic (0.25 or 0.5 MG/DOSE), Insulin Pen Needle, DULoxetine, esomeprazole, CVS B-1, Vitamin D (Ergocalciferol), ACCRUFeR, and Klor-Con M10.  Meds ordered this encounter  Medications   fluocinonide-emollient (LIDEX-E) 0.05 % cream    Sig: Apply 1 Application  topically 2 (two) times daily.    Dispense:  60 g    Refill:  1     Follow-up: Return in about 3 months (around 01/28/2022).  [name], MD

## 2021-10-29 ENCOUNTER — Encounter: Payer: Self-pay | Admitting: Internal Medicine

## 2021-11-08 DIAGNOSIS — Z1231 Encounter for screening mammogram for malignant neoplasm of breast: Secondary | ICD-10-CM | POA: Diagnosis not present

## 2021-11-08 DIAGNOSIS — N3941 Urge incontinence: Secondary | ICD-10-CM | POA: Diagnosis not present

## 2021-11-08 DIAGNOSIS — Z01419 Encounter for gynecological examination (general) (routine) without abnormal findings: Secondary | ICD-10-CM | POA: Diagnosis not present

## 2021-11-08 DIAGNOSIS — N951 Menopausal and female climacteric states: Secondary | ICD-10-CM | POA: Diagnosis not present

## 2021-11-08 DIAGNOSIS — Z130 Encounter for screening for diseases of the blood and blood-forming organs and certain disorders involving the immune mechanism: Secondary | ICD-10-CM | POA: Diagnosis not present

## 2021-11-08 DIAGNOSIS — Z1389 Encounter for screening for other disorder: Secondary | ICD-10-CM | POA: Diagnosis not present

## 2021-11-11 ENCOUNTER — Other Ambulatory Visit: Payer: Self-pay | Admitting: Internal Medicine

## 2021-11-11 DIAGNOSIS — I10 Essential (primary) hypertension: Secondary | ICD-10-CM

## 2021-11-15 ENCOUNTER — Other Ambulatory Visit: Payer: Self-pay | Admitting: Internal Medicine

## 2021-11-15 DIAGNOSIS — F331 Major depressive disorder, recurrent, moderate: Secondary | ICD-10-CM

## 2021-11-15 MED ORDER — BUPROPION HCL ER (XL) 150 MG PO TB24: 150.0000 mg | ORAL_TABLET | Freq: Every day | ORAL | 1 refills | Status: DC

## 2021-11-18 ENCOUNTER — Other Ambulatory Visit: Payer: Self-pay | Admitting: Internal Medicine

## 2021-11-18 DIAGNOSIS — N1831 Chronic kidney disease, stage 3a: Secondary | ICD-10-CM

## 2021-11-18 DIAGNOSIS — I10 Essential (primary) hypertension: Secondary | ICD-10-CM

## 2021-11-18 MED ORDER — HYDRALAZINE HCL 50 MG PO TABS: 50.0000 mg | ORAL_TABLET | Freq: Three times a day (TID) | ORAL | 0 refills | Status: DC

## 2021-11-26 ENCOUNTER — Ambulatory Visit (INDEPENDENT_AMBULATORY_CARE_PROVIDER_SITE_OTHER): Payer: 59 | Admitting: Internal Medicine

## 2021-11-26 ENCOUNTER — Encounter: Payer: Self-pay | Admitting: Internal Medicine

## 2021-11-26 VITALS — BP 138/78 | HR 86 | Temp 98.4°F | Resp 16 | Ht 63.0 in | Wt 260.0 lb

## 2021-11-26 DIAGNOSIS — E876 Hypokalemia: Secondary | ICD-10-CM

## 2021-11-26 DIAGNOSIS — I10 Essential (primary) hypertension: Secondary | ICD-10-CM

## 2021-11-26 DIAGNOSIS — J4520 Mild intermittent asthma, uncomplicated: Secondary | ICD-10-CM | POA: Diagnosis not present

## 2021-11-26 DIAGNOSIS — N1831 Chronic kidney disease, stage 3a: Secondary | ICD-10-CM | POA: Diagnosis not present

## 2021-11-26 LAB — BASIC METABOLIC PANEL
BUN: 10 mg/dL (ref 6–23)
CO2: 30 mEq/L (ref 19–32)
Calcium: 8.8 mg/dL (ref 8.4–10.5)
Chloride: 106 mEq/L (ref 96–112)
Creatinine, Ser: 0.89 mg/dL (ref 0.40–1.20)
GFR: 67.66 mL/min (ref >60.00)
Glucose, Bld: 86 mg/dL (ref 70–99)
Potassium: 3.8 mEq/L (ref 3.5–5.1)
Sodium: 142 mEq/L (ref 135–145)

## 2021-11-26 LAB — MAGNESIUM: Magnesium: 1.9 mg/dL (ref 1.5–2.5)

## 2021-11-26 MED ORDER — INDAPAMIDE 1.25 MG PO TABS: 1.2500 mg | ORAL_TABLET | Freq: Every day | ORAL | 1 refills | Status: DC

## 2021-11-26 MED ORDER — FLUTICASONE FUROATE-VILANTEROL 100-25 MCG/ACT IN AEPB: 1.0000 | INHALATION_SPRAY | Freq: Every day | RESPIRATORY_TRACT | 1 refills | Status: DC

## 2021-11-26 NOTE — Progress Notes (Signed)
Subjective:  Patient ID: [name], female    DOB: January 07, 1956  Age: 66 y.o. MRN: [id number]  CC: Asthma and Hypertension   HPI Juniper Cobey presents for f/up - -  She complains of chronic, nonproductive cough and wheezing.  She is not using any inhalers.  Outpatient Medications Prior to Visit  Medication Sig Dispense Refill   buPROPion (WELLBUTRIN XL) 150 MG 24 hr tablet Take 1 tablet (150 mg total) by mouth daily. 90 tablet 1   CVS B-1 100 MG tablet TAKE 1 TABLET BY MOUTH EVERY DAY 90 tablet 1   DULoxetine (CYMBALTA) 60 MG capsule TAKE 1 CAPSULE BY MOUTH EVERY DAY 90 capsule 1   esomeprazole (NEXIUM) 40 MG capsule TAKE 1 CAPSULE (40 MG TOTAL) BY MOUTH DAILY AT 12 NOON. 90 capsule 1   Ferric Maltol (ACCRUFER) 30 MG CAPS Take 1 capsule by mouth 2 (two) times daily. 60 capsule 0   fluocinonide-emollient (LIDEX-E) 0.05 % cream Apply 1 Application topically 2 (two) times daily. 60 g 1   Insulin Pen Needle 32G X 6 MM MISC 1 Act by Does not apply route once a week. 30 each 0   KLOR-CON M10 10 MEQ tablet TAKE 1 TABLET (10 MEQ TOTAL) BY MOUTH 3 (THREE) TIMES DAILY. 270 tablet 0   LINZESS 290 MCG CAPS capsule Take 1 capsule (290 mcg total) by mouth every morning. 90 capsule 1   Vitamin D, Ergocalciferol, (DRISDOL) 1.25 MG (50000 UNIT) CAPS capsule TAKE 1 CAPSULE (50,000 UNITS TOTAL) BY MOUTH EVERY 7 (SEVEN) DAYS 12 capsule 0   zinc gluconate (CVS ZINC GLUCONATE) 50 MG tablet Take 1 tablet (50 mg total) by mouth every other day. 45 tablet 1   hydrALAZINE (APRESOLINE) 50 MG tablet Take 1 tablet (50 mg total) by mouth 3 (three) times daily. (Patient not taking: Reported on 11/26/2021) 270 tablet 0   Semaglutide,0.25 or 0.5MG /DOS, (OZEMPIC, 0.25 OR 0.5 MG/DOSE,) 2 MG/3ML SOPN Inject 0.5 mg into the skin once a week. (Patient not taking: Reported on 11/26/2021) 3 mL 0   No facility-administered medications prior to visit.    ROS Review of Systems  Constitutional: Negative.  Negative for  appetite change, diaphoresis and fatigue.  HENT: Negative.    Respiratory:  Positive for cough and wheezing. Negative for chest tightness and shortness of breath.   Cardiovascular:  Negative for chest pain, palpitations and leg swelling.  Gastrointestinal:  Negative for abdominal pain, diarrhea, nausea and vomiting.  Genitourinary: Negative.  Negative for difficulty urinating.  Musculoskeletal: Negative.   Skin: Negative.   Neurological: Negative.  Negative for dizziness, weakness and light-headedness.  Hematological:  Negative for adenopathy. Does not bruise/bleed easily.  Psychiatric/Behavioral: Negative.      Objective:  BP 138/78 (BP Location: Left Arm, Patient Position: Sitting, Cuff Size: Large)   Pulse 86   Temp 98.4 F (36.9 C) (Oral)   Resp 16   Ht 5\' 3"  (1.6 m)   Wt 260 lb (117.9 kg)   SpO2 92%   BMI 46.06 kg/m   BP Readings from Last 3 Encounters:  11/26/21 138/78  10/28/21 120/80  07/24/21 118/70    Wt Readings from Last 3 Encounters:  11/26/21 260 lb (117.9 kg)  10/28/21 252 lb 4 oz (114.4 kg)  07/24/21 246 lb (111.6 kg)    Physical Exam Vitals reviewed.  Eyes:     General: No scleral icterus.    Pupils: Pupils are equal, round, and reactive to light.  Cardiovascular:  Rate and Rhythm: Normal rate and regular rhythm.     Heart sounds: No murmur heard. Pulmonary:     Effort: Pulmonary effort is normal.     Breath sounds: Examination of the right-upper field reveals wheezing. Examination of the left-upper field reveals wheezing. Examination of the right-middle field reveals wheezing. Examination of the left-middle field reveals wheezing. Examination of the right-lower field reveals wheezing. Examination of the left-lower field reveals wheezing. Wheezing present. No decreased breath sounds, rhonchi or rales.  Abdominal:     General: Abdomen is flat.     Palpations: There is no mass.     Tenderness: There is no abdominal tenderness. There is no guarding.      Hernia: No hernia is present.  Musculoskeletal:        General: Normal range of motion.     Cervical back: Neck supple.     Right lower leg: No edema.     Left lower leg: No edema.  Lymphadenopathy:     Cervical: No cervical adenopathy.  Skin:    General: Skin is warm and dry.  Neurological:     General: No focal deficit present.     Mental Status: She is alert. Mental status is at baseline.  Psychiatric:        Mood and Affect: Mood normal.        Behavior: Behavior normal.     Lab Results  Component Value Date   WBC 8.7 07/24/2021   HGB 12.5 07/24/2021   HCT 38.8 07/24/2021   PLT 323.0 07/24/2021   GLUCOSE 86 11/26/2021   CHOL 181 08/29/2020   TRIG 118.0 08/29/2020   HDL 53.20 08/29/2020   LDLCALC 104 (H) 08/29/2020   ALT 17 08/29/2020   AST 17 08/29/2020   NA 142 11/26/2021   K 3.8 11/26/2021   CL 106 11/26/2021   CREATININE 0.89 11/26/2021   BUN 10 11/26/2021   CO2 30 11/26/2021   TSH 2.31 07/24/2021   HGBA1C 5.8 08/29/2020    CT Chest Wo Contrast  Result Date: 09/08/2021 CLINICAL DATA:  follow on lung nodules EXAM: CT CHEST WITHOUT CONTRAST TECHNIQUE: Multidetector CT imaging of the chest was performed following the standard protocol without IV contrast. RADIATION DOSE REDUCTION: This exam was performed according to the departmental dose-optimization program which includes automated exposure control, adjustment of the mA and/or kV according to patient size and/or use of iterative reconstruction technique. COMPARISON:  CT chest 02/22/2018, CT abdomen pelvis 07/14/2019 FINDINGS: Cardiovascular: Normal heart size. No significant pericardial effusion. The thoracic aorta is normal in caliber. No atherosclerotic plaque of the thoracic aorta. No coronary artery calcifications. The main pulmonary artery measures at the upper limits of normal. Mediastinum/Nodes: No enlarged mediastinal or axillary lymph nodes. Thyroid gland, trachea, and esophagus demonstrate no  significant findings. Lungs/Pleura: No focal consolidation. Slightly more conspicuous stable in size bilateral scattered pulmonary nodules measuring up to 8 mm (8:39, 56, 60, 73, 80, 83, 85, 94, 123). No new pulmonary nodule. No pulmonary mass. No pleural effusion. No pneumothorax. Upper Abdomen: Gastric lap band grossly appropriately positioned with a phi angle of 51 degrees. Continuous tubing of the gastric lap band noted without kinking or discontinuity. Status post cholecystectomy. No acute abnormality. Musculoskeletal: No chest wall abnormality. No suspicious lytic or blastic osseous lesions. No acute displaced fracture. Multilevel degenerative changes of the spine. IMPRESSION: 1. Stable scattered pulmonary nodules measuring up to 8 mm. No new pulmonary nodule. No further follow-up indicated. 2. Gastric lap band grossly  appropriately positioned. Electronically Signed   By: Iven Finn M.D.   On: 09/08/2021 17:47    Assessment & Plan:   Lewellyn was seen today for asthma and hypertension.  Diagnoses and all orders for this visit:  Stage 3a chronic kidney disease ([place])- She is avoiding nephrotoxic agents. -     Basic metabolic panel; Future -     Basic metabolic panel  Essential hypertension- She has not achieved her blood pressure goal of 130/80.  Will treat with a thiazide diuretic. -     Basic metabolic panel; Future -     Magnesium; Future -     Magnesium -     Basic metabolic panel -     indapamide (LOZOL) 1.25 MG tablet; Take 1 tablet (1.25 mg total) by mouth daily.  Hypopotassemia -     Basic metabolic panel; Future -     Magnesium; Future -     Magnesium -     Basic metabolic panel  Mild intermittent asthma without complication- Will treat with a LABA/ICS inhaler. -     fluticasone furoate-vilanterol (BREO ELLIPTA) 100-25 MCG/ACT AEPB; Inhale 1 puff into the lungs daily.   I have discontinued Thayer Headings Skillman's Ozempic (0.25 or 0.5 MG/DOSE) and hydrALAZINE. I am also having  her start on fluticasone furoate-vilanterol and indapamide. Additionally, I am having her maintain her zinc gluconate, Linzess, Insulin Pen Needle, DULoxetine, esomeprazole, CVS B-1, Vitamin D (Ergocalciferol), ACCRUFeR, Klor-Con M10, fluocinonide-emollient, and buPROPion.  Meds ordered this encounter  Medications   fluticasone furoate-vilanterol (BREO ELLIPTA) 100-25 MCG/ACT AEPB    Sig: Inhale 1 puff into the lungs daily.    Dispense:  120 each    Refill:  1   indapamide (LOZOL) 1.25 MG tablet    Sig: Take 1 tablet (1.25 mg total) by mouth daily.    Dispense:  90 tablet    Refill:  1     Follow-up: Return in about 3 months (around 02/26/2022).  Scarlette Calico, MD

## 2021-11-26 NOTE — Patient Instructions (Signed)
Hypertension, Adult High blood pressure (hypertension) is when the force of blood pumping through the arteries is too strong. The arteries are the blood vessels that carry blood from the heart throughout the body. Hypertension forces the heart to work harder to pump blood and may cause arteries to become narrow or stiff. Untreated or uncontrolled hypertension can lead to a heart attack, heart failure, a stroke, kidney disease, and other problems. A blood pressure reading consists of a higher number over a lower number. Ideally, your blood pressure should be below 120/80. The first ("top") number is called the systolic pressure. It is a measure of the pressure in your arteries as your heart beats. The second ("bottom") number is called the diastolic pressure. It is a measure of the pressure in your arteries as the heart relaxes. What are the causes? The exact cause of this condition is not known. There are some conditions that result in high blood pressure. What increases the risk? Certain factors may make you more likely to develop high blood pressure. Some of these risk factors are under your control, including: Smoking. Not getting enough exercise or physical activity. Being overweight. Having too much fat, sugar, calories, or salt (sodium) in your diet. Drinking too much alcohol. Other risk factors include: Having a personal history of heart disease, diabetes, high cholesterol, or kidney disease. Stress. Having a family history of high blood pressure and high cholesterol. Having obstructive sleep apnea. Age. The risk increases with age. What are the signs or symptoms? High blood pressure may not cause symptoms. Very high blood pressure (hypertensive crisis) may cause: Headache. Fast or irregular heartbeats (palpitations). Shortness of breath. Nosebleed. Nausea and vomiting. Vision changes. Severe chest pain, dizziness, and seizures. How is this diagnosed? This condition is diagnosed by  measuring your blood pressure while you are seated, with your arm resting on a flat surface, your legs uncrossed, and your feet flat on the floor. The cuff of the blood pressure monitor will be placed directly against the skin of your upper arm at the level of your heart. Blood pressure should be measured at least twice using the same arm. Certain conditions can cause a difference in blood pressure between your right and left arms. If you have a high blood pressure reading during one visit or you have normal blood pressure with other risk factors, you may be asked to: Return on a different day to have your blood pressure checked again. Monitor your blood pressure at home for 1 week or longer. If you are diagnosed with hypertension, you may have other blood or imaging tests to help your health care provider understand your overall risk for other conditions. How is this treated? This condition is treated by making healthy lifestyle changes, such as eating healthy foods, exercising more, and reducing your alcohol intake. You may be referred for counseling on a healthy diet and physical activity. Your health care provider may prescribe medicine if lifestyle changes are not enough to get your blood pressure under control and if: Your systolic blood pressure is above 130. Your diastolic blood pressure is above 80. Your personal target blood pressure may vary depending on your medical conditions, your age, and other factors. Follow these instructions at home: Eating and drinking  Eat a diet that is high in fiber and potassium, and low in sodium, added sugar, and fat. An example of this eating plan is called the DASH diet. DASH stands for Dietary Approaches to Stop Hypertension. To eat this way: Eat   plenty of fresh fruits and vegetables. Try to fill one half of your plate at each meal with fruits and vegetables. Eat whole grains, such as whole-wheat pasta, brown rice, or whole-grain bread. Fill about one  fourth of your plate with whole grains. Eat or drink low-fat dairy products, such as skim milk or low-fat yogurt. Avoid fatty cuts of meat, processed or cured meats, and poultry with skin. Fill about one fourth of your plate with lean proteins, such as fish, chicken without skin, beans, eggs, or tofu. Avoid pre-made and processed foods. These tend to be higher in sodium, added sugar, and fat. Reduce your daily sodium intake. Many people with hypertension should eat less than 1,500 mg of sodium a day. Do not drink alcohol if: Your health care provider tells you not to drink. You are pregnant, may be pregnant, or are planning to become pregnant. If you drink alcohol: Limit how much you have to: 0-1 drink a day for women. 0-2 drinks a day for men. Know how much alcohol is in your drink. In the [place] one drink equals one 12 oz bottle of beer (355 mL), one 5 oz glass of wine (148 mL), or one 1 oz glass of hard liquor (44 mL). Lifestyle  Work with your health care provider to maintain a healthy body weight or to lose weight. Ask what an ideal weight is for you. Get at least 30 minutes of exercise that causes your heart to beat faster (aerobic exercise) most days of the week. Activities may include walking, swimming, or biking. Include exercise to strengthen your muscles (resistance exercise), such as Pilates or lifting weights, as part of your weekly exercise routine. Try to do these types of exercises for 30 minutes at least 3 days a week. Do not use any products that contain nicotine or tobacco. These products include cigarettes, chewing tobacco, and vaping devices, such as e-cigarettes. If you need help quitting, ask your health care provider. Monitor your blood pressure at home as told by your health care provider. Keep all follow-up visits. This is important. Medicines Take over-the-counter and prescription medicines only as told by your health care provider. Follow directions carefully. Blood  pressure medicines must be taken as prescribed. Do not skip doses of blood pressure medicine. Doing this puts you at risk for problems and can make the medicine less effective. Ask your health care provider about side effects or reactions to medicines that you should watch for. Contact a health care provider if you: Think you are having a reaction to a medicine you are taking. Have headaches that keep coming back (recurring). Feel dizzy. Have swelling in your ankles. Have trouble with your vision. Get help right away if you: Develop a severe headache or confusion. Have unusual weakness or numbness. Feel faint. Have severe pain in your chest or abdomen. Vomit repeatedly. Have trouble breathing. These symptoms may be an emergency. Get help right away. Call 911. Do not wait to see if the symptoms will go away. Do not drive yourself to the hospital. Summary Hypertension is when the force of blood pumping through your arteries is too strong. If this condition is not controlled, it may put you at risk for serious complications. Your personal target blood pressure may vary depending on your medical conditions, your age, and other factors. For most people, a normal blood pressure is less than 120/80. Hypertension is treated with lifestyle changes, medicines, or a combination of both. Lifestyle changes include losing weight, eating a healthy,   low-sodium diet, exercising more, and limiting alcohol. This information is not intended to replace advice given to you by your health care provider. Make sure you discuss any questions you have with your health care provider. Document Revised: 10/30/2020 Document Reviewed: 10/30/2020 Elsevier Patient Education  2023 Elsevier Inc.  

## 2021-12-04 DIAGNOSIS — M6281 Muscle weakness (generalized): Secondary | ICD-10-CM | POA: Diagnosis not present

## 2021-12-04 DIAGNOSIS — N3941 Urge incontinence: Secondary | ICD-10-CM | POA: Diagnosis not present

## 2021-12-05 ENCOUNTER — Encounter: Payer: Self-pay | Admitting: *Deleted

## 2021-12-05 ENCOUNTER — Telehealth: Payer: Self-pay | Admitting: *Deleted

## 2021-12-05 NOTE — Patient Outreach (Signed)
  Care Coordination   12/05/2021 Name: [name] MRN: [id number] DOB: 03-01-1955   Care Coordination Outreach Attempts:  An unsuccessful telephone outreach was attempted today to offer the patient information about available care coordination services as a benefit of their health plan.   Follow Up Plan:  Additional outreach attempts will be made to offer the patient care coordination information and services.   Encounter Outcome:  No Answer left voice message requesting call back   Care Coordination Interventions:  No, not indicated unsuccessful outreach attempt # 1    Caryl Pina, RN, BSN, CCRN Alumnus RN CM Care Coordination/ Transition of Care- [facility] Care Management [phone]: direct office

## 2021-12-23 ENCOUNTER — Other Ambulatory Visit: Payer: Self-pay | Admitting: Internal Medicine

## 2021-12-23 DIAGNOSIS — E559 Vitamin D deficiency, unspecified: Secondary | ICD-10-CM

## 2021-12-24 ENCOUNTER — Encounter: Payer: Self-pay | Admitting: *Deleted

## 2021-12-24 ENCOUNTER — Telehealth: Payer: Self-pay | Admitting: *Deleted

## 2021-12-24 NOTE — Patient Outreach (Signed)
  Care Coordination   12/24/2021 Name: Shevette Bess MRN: [id number] DOB: [birth year]   Care Coordination Outreach Attempts:  A second unsuccessful outreach was attempted today to offer the patient with information about available care coordination services as a benefit of their health plan.     Follow Up Plan:  Additional outreach attempts will be made to offer the patient care coordination information and services.   Encounter Outcome:  No Answer- left voice message requesting call back   Care Coordination Interventions:  No, not indicated unsuccessful outreach attempt # 2    Caryl Pina, RN, BSN, CCRN Alumnus RN CM Care Coordination/ Transition of Care- [facility] Care Management [phone]: direct office

## 2021-12-26 ENCOUNTER — Other Ambulatory Visit: Payer: Self-pay | Admitting: Internal Medicine

## 2021-12-26 DIAGNOSIS — E519 Thiamine deficiency, unspecified: Secondary | ICD-10-CM

## 2021-12-26 DIAGNOSIS — Z9884 Bariatric surgery status: Secondary | ICD-10-CM

## 2021-12-27 ENCOUNTER — Other Ambulatory Visit: Payer: Self-pay | Admitting: Internal Medicine

## 2021-12-27 DIAGNOSIS — F331 Major depressive disorder, recurrent, moderate: Secondary | ICD-10-CM

## 2022-01-02 LAB — HM PAP SMEAR

## 2022-01-02 LAB — HM MAMMOGRAPHY: HM Mammogram: NORMAL (ref 0–4)

## 2022-01-21 ENCOUNTER — Other Ambulatory Visit: Payer: Self-pay | Admitting: Internal Medicine

## 2022-01-21 DIAGNOSIS — E60 Dietary zinc deficiency: Secondary | ICD-10-CM

## 2022-01-21 DIAGNOSIS — Z9884 Bariatric surgery status: Secondary | ICD-10-CM

## 2022-01-24 ENCOUNTER — Ambulatory Visit: Payer: 59 | Admitting: Internal Medicine

## 2022-02-03 DIAGNOSIS — M62838 Other muscle spasm: Secondary | ICD-10-CM | POA: Diagnosis not present

## 2022-02-03 DIAGNOSIS — M6281 Muscle weakness (generalized): Secondary | ICD-10-CM | POA: Diagnosis not present

## 2022-02-03 DIAGNOSIS — K5900 Constipation, unspecified: Secondary | ICD-10-CM | POA: Diagnosis not present

## 2022-02-03 DIAGNOSIS — N3941 Urge incontinence: Secondary | ICD-10-CM | POA: Diagnosis not present

## 2022-02-03 DIAGNOSIS — M6289 Other specified disorders of muscle: Secondary | ICD-10-CM | POA: Diagnosis not present

## 2022-02-15 ENCOUNTER — Other Ambulatory Visit: Payer: Self-pay | Admitting: Internal Medicine

## 2022-02-15 DIAGNOSIS — F418 Other specified anxiety disorders: Secondary | ICD-10-CM

## 2022-02-16 ENCOUNTER — Other Ambulatory Visit: Payer: Self-pay | Admitting: Internal Medicine

## 2022-02-16 DIAGNOSIS — N1831 Chronic kidney disease, stage 3a: Secondary | ICD-10-CM

## 2022-02-16 DIAGNOSIS — I10 Essential (primary) hypertension: Secondary | ICD-10-CM

## 2022-02-20 ENCOUNTER — Other Ambulatory Visit: Payer: Self-pay | Admitting: Internal Medicine

## 2022-02-20 DIAGNOSIS — E559 Vitamin D deficiency, unspecified: Secondary | ICD-10-CM

## 2022-02-26 ENCOUNTER — Ambulatory Visit: Payer: 59 | Admitting: Internal Medicine

## 2022-02-26 DIAGNOSIS — K5900 Constipation, unspecified: Secondary | ICD-10-CM | POA: Diagnosis not present

## 2022-02-26 DIAGNOSIS — M62838 Other muscle spasm: Secondary | ICD-10-CM | POA: Diagnosis not present

## 2022-02-26 DIAGNOSIS — N3941 Urge incontinence: Secondary | ICD-10-CM | POA: Diagnosis not present

## 2022-02-26 DIAGNOSIS — M6289 Other specified disorders of muscle: Secondary | ICD-10-CM | POA: Diagnosis not present

## 2022-02-26 DIAGNOSIS — M6281 Muscle weakness (generalized): Secondary | ICD-10-CM | POA: Diagnosis not present

## 2022-03-12 DIAGNOSIS — M62838 Other muscle spasm: Secondary | ICD-10-CM | POA: Diagnosis not present

## 2022-03-12 DIAGNOSIS — K5900 Constipation, unspecified: Secondary | ICD-10-CM | POA: Diagnosis not present

## 2022-03-12 DIAGNOSIS — M6281 Muscle weakness (generalized): Secondary | ICD-10-CM | POA: Diagnosis not present

## 2022-03-12 DIAGNOSIS — N3941 Urge incontinence: Secondary | ICD-10-CM | POA: Diagnosis not present

## 2022-03-12 DIAGNOSIS — M6289 Other specified disorders of muscle: Secondary | ICD-10-CM | POA: Diagnosis not present

## 2022-03-17 ENCOUNTER — Other Ambulatory Visit: Payer: Self-pay | Admitting: Internal Medicine

## 2022-03-17 DIAGNOSIS — E559 Vitamin D deficiency, unspecified: Secondary | ICD-10-CM

## 2022-03-26 DIAGNOSIS — M6281 Muscle weakness (generalized): Secondary | ICD-10-CM | POA: Diagnosis not present

## 2022-03-26 DIAGNOSIS — N3941 Urge incontinence: Secondary | ICD-10-CM | POA: Diagnosis not present

## 2022-03-26 DIAGNOSIS — M6289 Other specified disorders of muscle: Secondary | ICD-10-CM | POA: Diagnosis not present

## 2022-03-26 DIAGNOSIS — M62838 Other muscle spasm: Secondary | ICD-10-CM | POA: Diagnosis not present

## 2022-03-26 DIAGNOSIS — K5900 Constipation, unspecified: Secondary | ICD-10-CM | POA: Diagnosis not present

## 2022-04-16 ENCOUNTER — Other Ambulatory Visit: Payer: Self-pay | Admitting: Internal Medicine

## 2022-04-16 DIAGNOSIS — I10 Essential (primary) hypertension: Secondary | ICD-10-CM

## 2022-04-16 DIAGNOSIS — F331 Major depressive disorder, recurrent, moderate: Secondary | ICD-10-CM

## 2022-04-27 ENCOUNTER — Other Ambulatory Visit: Payer: Self-pay | Admitting: Internal Medicine

## 2022-04-27 DIAGNOSIS — K5904 Chronic idiopathic constipation: Secondary | ICD-10-CM

## 2022-05-17 ENCOUNTER — Other Ambulatory Visit: Payer: Self-pay | Admitting: Internal Medicine

## 2022-05-17 DIAGNOSIS — Z9884 Bariatric surgery status: Secondary | ICD-10-CM

## 2022-05-17 DIAGNOSIS — E60 Dietary zinc deficiency: Secondary | ICD-10-CM

## 2022-05-19 ENCOUNTER — Ambulatory Visit (INDEPENDENT_AMBULATORY_CARE_PROVIDER_SITE_OTHER): Payer: 59 | Admitting: Internal Medicine

## 2022-05-19 VITALS — BP 126/74 | HR 91 | Temp 98.4°F | Ht 63.0 in | Wt 248.0 lb

## 2022-05-19 DIAGNOSIS — E2839 Other primary ovarian failure: Secondary | ICD-10-CM

## 2022-05-19 DIAGNOSIS — E538 Deficiency of other specified B group vitamins: Secondary | ICD-10-CM | POA: Diagnosis not present

## 2022-05-19 DIAGNOSIS — E611 Iron deficiency: Secondary | ICD-10-CM | POA: Diagnosis not present

## 2022-05-19 DIAGNOSIS — R7303 Prediabetes: Secondary | ICD-10-CM

## 2022-05-19 DIAGNOSIS — N182 Chronic kidney disease, stage 2 (mild): Secondary | ICD-10-CM

## 2022-05-19 DIAGNOSIS — R9431 Abnormal electrocardiogram [ECG] [EKG]: Secondary | ICD-10-CM | POA: Diagnosis not present

## 2022-05-19 DIAGNOSIS — Z9884 Bariatric surgery status: Secondary | ICD-10-CM | POA: Diagnosis not present

## 2022-05-19 DIAGNOSIS — I10 Essential (primary) hypertension: Secondary | ICD-10-CM | POA: Diagnosis not present

## 2022-05-19 DIAGNOSIS — E60 Dietary zinc deficiency: Secondary | ICD-10-CM

## 2022-05-19 DIAGNOSIS — N1831 Chronic kidney disease, stage 3a: Secondary | ICD-10-CM | POA: Diagnosis not present

## 2022-05-19 DIAGNOSIS — G4730 Sleep apnea, unspecified: Secondary | ICD-10-CM

## 2022-05-19 DIAGNOSIS — G4700 Insomnia, unspecified: Secondary | ICD-10-CM

## 2022-05-19 DIAGNOSIS — E785 Hyperlipidemia, unspecified: Secondary | ICD-10-CM | POA: Diagnosis not present

## 2022-05-19 DIAGNOSIS — E8881 Metabolic syndrome: Secondary | ICD-10-CM

## 2022-05-19 DIAGNOSIS — T502X5A Adverse effect of carbonic-anhydrase inhibitors, benzothiadiazides and other diuretics, initial encounter: Secondary | ICD-10-CM

## 2022-05-19 DIAGNOSIS — E876 Hypokalemia: Secondary | ICD-10-CM | POA: Diagnosis not present

## 2022-05-19 DIAGNOSIS — D510 Vitamin B12 deficiency anemia due to intrinsic factor deficiency: Secondary | ICD-10-CM

## 2022-05-19 DIAGNOSIS — N3941 Urge incontinence: Secondary | ICD-10-CM | POA: Diagnosis not present

## 2022-05-19 DIAGNOSIS — E519 Thiamine deficiency, unspecified: Secondary | ICD-10-CM

## 2022-05-19 LAB — CBC WITH DIFFERENTIAL/PLATELET
Basophils Absolute: 0.1 10*3/uL (ref 0.0–0.1)
Basophils Relative: 0.6 % (ref 0.0–3.0)
Eosinophils Absolute: 0.2 10*3/uL (ref 0.0–0.7)
Eosinophils Relative: 2 % (ref 0.0–5.0)
HCT: 40.1 % (ref 36.0–46.0)
Hemoglobin: 13.4 g/dL (ref 12.0–15.0)
Lymphocytes Relative: 29.6 % (ref 12.0–46.0)
Lymphs Abs: 2.8 10*3/uL (ref 0.7–4.0)
MCHC: 33.3 g/dL (ref 30.0–36.0)
MCV: 89.2 fl (ref 78.0–100.0)
Monocytes Absolute: 0.8 10*3/uL (ref 0.1–1.0)
Monocytes Relative: 8.2 % (ref 3.0–12.0)
Neutro Abs: 5.7 10*3/uL (ref 1.4–7.7)
Neutrophils Relative %: 59.6 % (ref 43.0–77.0)
Platelets: 284 10*3/uL (ref 150.0–400.0)
RBC: 4.5 Mil/uL (ref 3.87–5.11)
RDW: 14.9 % (ref 11.5–15.5)
WBC: 9.6 10*3/uL (ref 4.0–10.5)

## 2022-05-19 LAB — LIPID PANEL
Cholesterol: 193 mg/dL (ref 0–200)
HDL: 54.5 mg/dL (ref >39.00)
LDL Cholesterol: 102 mg/dL — ABNORMAL HIGH (ref 0–99)
NonHDL: 138.78
Total CHOL/HDL Ratio: 4
Triglycerides: 183 mg/dL — ABNORMAL HIGH (ref 0.0–149.0)
VLDL: 36.6 mg/dL (ref 0.0–40.0)

## 2022-05-19 LAB — IBC + FERRITIN
Ferritin: 16.1 ng/mL (ref 10.0–291.0)
Iron: 87 ug/dL (ref 42–145)
Saturation Ratios: 24.8 % (ref 20.0–50.0)
TIBC: 351.4 ug/dL (ref 250.0–450.0)
Transferrin: 251 mg/dL (ref 212.0–360.0)

## 2022-05-19 LAB — BASIC METABOLIC PANEL WITH GFR
BUN: 15 mg/dL (ref 6–23)
CO2: 32 meq/L (ref 19–32)
Calcium: 9.4 mg/dL (ref 8.4–10.5)
Chloride: 98 meq/L (ref 96–112)
Creatinine, Ser: 0.94 mg/dL (ref 0.40–1.20)
GFR: 63.16 mL/min
Glucose, Bld: 93 mg/dL (ref 70–99)
Potassium: 3 meq/L — ABNORMAL LOW (ref 3.5–5.1)
Sodium: 140 meq/L (ref 135–145)

## 2022-05-19 LAB — FOLATE: Folate: 5.7 ng/mL — ABNORMAL LOW (ref >5.9)

## 2022-05-19 LAB — HEMOGLOBIN A1C: Hgb A1c MFr Bld: 5.8 % (ref 4.6–6.5)

## 2022-05-19 MED ORDER — FOLIC ACID 1 MG PO TABS
1.0000 mg | ORAL_TABLET | Freq: Every day | ORAL | 1 refills | Status: DC
Start: 2022-05-19 — End: 2022-11-13

## 2022-05-19 MED ORDER — CYANOCOBALAMIN 1000 MCG/ML IJ SOLN
1000.0000 ug | Freq: Once | INTRAMUSCULAR | Status: AC
Start: 2022-05-19 — End: 2022-05-19
  Administered 2022-05-19: 1000 ug via INTRAMUSCULAR

## 2022-05-19 MED ORDER — ESZOPICLONE 2 MG PO TABS
2.0000 mg | ORAL_TABLET | Freq: Every evening | ORAL | 1 refills | Status: DC | PRN
Start: 2022-05-19 — End: 2022-09-17

## 2022-05-19 MED ORDER — POTASSIUM CHLORIDE CRYS ER 10 MEQ PO TBCR
EXTENDED_RELEASE_TABLET | ORAL | 0 refills | Status: DC
Start: 2022-05-19 — End: 2022-09-17

## 2022-05-19 NOTE — Progress Notes (Signed)
Subjective:  Patient ID: [name], female    DOB: March 26, 1955  Age: 68 y.o. MRN: [id number]  CC: Hypertension, Hyperlipidemia, and Depression   HPI [name] presents for f/up ----  She complains that her antidepressants are not very effective.  She continues to complain of anhedonia, crying spells, and insomnia.  She takes Benadryl for insomnia.  She is active and denies chest pain, shortness of breath, diaphoresis, or edema.  Outpatient Medications Prior to Visit  Medication Sig Dispense Refill   buPROPion (WELLBUTRIN XL) 150 MG 24 hr tablet TAKE 1 TABLET BY MOUTH EVERY DAY 90 tablet 0   [facility] B-1 100 MG tablet TAKE 1 TABLET BY MOUTH EVERY DAY 90 tablet 1   DULoxetine (CYMBALTA) 60 MG capsule TAKE 1 CAPSULE BY MOUTH EVERY DAY 90 capsule 1   esomeprazole (NEXIUM) 40 MG capsule TAKE 1 CAPSULE (40 MG TOTAL) BY MOUTH DAILY AT 12 NOON. 90 capsule 1   fluocinonide-emollient (LIDEX-E) 0.05 % cream Apply 1 Application topically 2 (two) times daily. 60 g 1   fluticasone furoate-vilanterol (BREO ELLIPTA) 100-25 MCG/ACT AEPB Inhale 1 puff into the lungs daily. 120 each 1   indapamide (LOZOL) 1.25 MG tablet TAKE 1 TABLET BY MOUTH DAILY. 90 tablet 0   LINZESS 290 MCG CAPS capsule TAKE 1 CAPSULE (290 MCG TOTAL) BY MOUTH EVERY MORNING 90 capsule 1   Vitamin D, Ergocalciferol, (DRISDOL) 1.25 MG (50000 UNIT) CAPS capsule TAKE 1 CAPSULE (50,000 UNITS TOTAL) BY MOUTH EVERY 7 (SEVEN) DAYS 12 capsule 0   Zinc 50 MG TABS TAKE 1 TABLET BY MOUTH EVERY OTHER DAY. 45 tablet 1   KLOR-CON M10 10 MEQ tablet TAKE 1 TABLET (10 MEQ TOTAL) BY MOUTH 3 (THREE) TIMES DAILY. 270 tablet 0   Ferric Maltol (ACCRUFER) 30 MG CAPS Take 1 capsule by mouth 2 (two) times daily. 60 capsule 0   Insulin Pen Needle 32G X 6 MM MISC 1 Act by Does not apply route once a week. 30 each 0   No facility-administered medications prior to visit.    ROS Review of Systems  Constitutional:  Positive for fatigue. Negative for appetite  change, chills, diaphoresis, fever and unexpected weight change.  HENT: Negative.    Eyes: Negative.   Respiratory:  Negative for cough, chest tightness, shortness of breath and wheezing.   Cardiovascular:  Negative for chest pain, palpitations and leg swelling.  Gastrointestinal: Negative.  Negative for abdominal pain, constipation, diarrhea, nausea and vomiting.  Endocrine: Negative.   Genitourinary: Negative.  Negative for difficulty urinating.  Musculoskeletal: Negative.  Negative for myalgias.  Skin: Negative.   Neurological: Negative.  Negative for dizziness and weakness.  Hematological:  Negative for adenopathy. Does not bruise/bleed easily.  Psychiatric/Behavioral:  Positive for dysphoric mood and sleep disturbance. Negative for confusion and decreased concentration. The patient is not nervous/anxious.     Objective:  BP 126/74 (BP Location: Left Arm, Patient Position: Sitting, Cuff Size: Large)   Pulse 91   Temp 98.4 F (36.9 C) (Oral)   Ht 5\' 3"  (1.6 m)   Wt 248 lb (112.5 kg)   SpO2 95%   BMI 43.93 kg/m   BP Readings from Last 3 Encounters:  05/19/22 126/74  11/26/21 138/78  10/28/21 120/80    Wt Readings from Last 3 Encounters:  05/19/22 248 lb (112.5 kg)  11/26/21 260 lb (117.9 kg)  10/28/21 252 lb 4 oz (114.4 kg)    Physical Exam Vitals reviewed.  Constitutional:  General: She is not in acute distress.    Appearance: She is obese. She is not ill-appearing, toxic-appearing or diaphoretic.  HENT:     Nose: Nose normal.     Mouth/Throat:     Mouth: Mucous membranes are moist.  Eyes:     General: No scleral icterus.    Conjunctiva/sclera: Conjunctivae normal.  Cardiovascular:     Rate and Rhythm: Normal rate and regular rhythm.     Heart sounds: Normal heart sounds, S1 normal and S2 normal.     No friction rub. No gallop.     Comments: EKG- NSR, 83 bpm Incomplete RBBB TWI in several leads is not new Prolonged QT No LVH or Q waves Pulmonary:      Effort: Pulmonary effort is normal.     Breath sounds: No stridor. No wheezing, rhonchi or rales.  Abdominal:     General: Abdomen is protuberant. There is no distension.     Palpations: There is no hepatomegaly, splenomegaly or mass.     Tenderness: There is no abdominal tenderness. There is no guarding.     Hernia: No hernia is present.  Musculoskeletal:        General: No swelling or deformity.     Cervical back: Neck supple.     Right lower leg: No edema.     Left lower leg: No edema.  Lymphadenopathy:     Cervical: No cervical adenopathy.  Skin:    General: Skin is warm and dry.     Coloration: Skin is not pale.  Neurological:     General: No focal deficit present.     Mental Status: She is alert. Mental status is at baseline.  Psychiatric:        Mood and Affect: Mood normal.        Behavior: Behavior normal.     Lab Results  Component Value Date   WBC 9.6 05/19/2022   HGB 13.4 05/19/2022   HCT 40.1 05/19/2022   PLT 284.0 05/19/2022   GLUCOSE 93 05/19/2022   CHOL 193 05/19/2022   TRIG 183.0 (H) 05/19/2022   HDL 54.50 05/19/2022   LDLCALC 102 (H) 05/19/2022   ALT 17 08/29/2020   AST 17 08/29/2020   NA 140 05/19/2022   K 3.0 (L) 05/19/2022   CL 98 05/19/2022   CREATININE 0.94 05/19/2022   BUN 15 05/19/2022   CO2 32 05/19/2022   TSH 2.31 07/24/2021   HGBA1C 5.8 05/19/2022    CT Chest Wo Contrast  Result Date: 09/08/2021 CLINICAL DATA:  follow on lung nodules EXAM: CT CHEST WITHOUT CONTRAST TECHNIQUE: Multidetector CT imaging of the chest was performed following the standard protocol without IV contrast. RADIATION DOSE REDUCTION: This exam was performed according to the departmental dose-optimization program which includes automated exposure control, adjustment of the mA and/or kV according to patient size and/or use of iterative reconstruction technique. COMPARISON:  CT chest 02/22/2018, CT abdomen pelvis 07/14/2019 FINDINGS: Cardiovascular: Normal heart size. No  significant pericardial effusion. The thoracic aorta is normal in caliber. No atherosclerotic plaque of the thoracic aorta. No coronary artery calcifications. The main pulmonary artery measures at the upper limits of normal. Mediastinum/Nodes: No enlarged mediastinal or axillary lymph nodes. Thyroid gland, trachea, and esophagus demonstrate no significant findings. Lungs/Pleura: No focal consolidation. Slightly more conspicuous stable in size bilateral scattered pulmonary nodules measuring up to 8 mm (8:39, 56, 60, 73, 80, 83, 85, 94, 123). No new pulmonary nodule. No pulmonary mass. No pleural effusion. No pneumothorax.  Upper Abdomen: Gastric lap band grossly appropriately positioned with a phi angle of 51 degrees. Continuous tubing of the gastric lap band noted without kinking or discontinuity. Status post cholecystectomy. No acute abnormality. Musculoskeletal: No chest wall abnormality. No suspicious lytic or blastic osseous lesions. No acute displaced fracture. Multilevel degenerative changes of the spine. IMPRESSION: 1. Stable scattered pulmonary nodules measuring up to 8 mm. No new pulmonary nodule. No further follow-up indicated. 2. Gastric lap band grossly appropriately positioned. Electronically Signed   By: Tish Frederickson M.D.   On: 09/08/2021 17:47    Assessment & Plan:   Abnormal electrocardiogram (ECG) (EKG) -     Ambulatory referral to Cardiology  Vitamin B12 deficiency anemia due to intrinsic factor deficiency -     Folate; Future -     CBC with Differential/Platelet; Future -     Cyanocobalamin -     Folic Acid; Take 1 tablet (1 mg total) by mouth daily.  Dispense: 90 tablet; Refill: 1  Essential hypertension-her blood pressure is adequately well-controlled. -     EKG 12-Lead -     Potassium Chloride Crys ER; TAKE 1 TABLET (10 MEQ TOTAL) BY MOUTH 3 (THREE) TIMES DAILY.  Dispense: 270 tablet; Refill: 0  Hyperlipidemia with target LDL less than 130- Statin is not indicated. -      Lipid panel; Future  Insomnia with sleep apnea- I think she would feel better if she stopped taking Benadryl. -     Eszopiclone; Take 1 tablet (2 mg total) by mouth at bedtime as needed for sleep. Take immediately before bedtime  Dispense: 90 tablet; Refill: 1  Insulin resistance syndrome -     Hemoglobin A1c; Future -     Basic metabolic panel; Future  Prediabetes -     Hemoglobin A1c; Future -     Basic metabolic panel; Future  Stage 3a chronic kidney disease (HCC) -     Basic metabolic panel; Future  Zinc deficiency -     CBC with Differential/Platelet; Future  Estrogen deficiency -     DG Bone Density; Future  LAP-BAND surgery status -     IBC + Ferritin; Future -     Folate; Future  Prolonged Q-T interval on ECG -     Ambulatory referral to Cardiology  Iron deficiency -     IBC + Ferritin; Future -     CBC with Differential/Platelet; Future  Folate deficiency -     Folic Acid; Take 1 tablet (1 mg total) by mouth daily.  Dispense: 90 tablet; Refill: 1  Diuretic-induced hypokalemia -     Potassium Chloride Crys ER; TAKE 1 TABLET (10 MEQ TOTAL) BY MOUTH 3 (THREE) TIMES DAILY.  Dispense: 270 tablet; Refill: 0     Follow-up: Return in about 6 months (around 11/19/2022).  [name], MD

## 2022-05-19 NOTE — Patient Instructions (Signed)
Hypertension, Adult High blood pressure (hypertension) is when the force of blood pumping through the arteries is too strong. The arteries are the blood vessels that carry blood from the heart throughout the body. Hypertension forces the heart to work harder to pump blood and may cause arteries to become narrow or stiff. Untreated or uncontrolled hypertension can lead to a heart attack, heart failure, a stroke, kidney disease, and other problems. A blood pressure reading consists of a higher number over a lower number. Ideally, your blood pressure should be below 120/80. The first ("top") number is called the systolic pressure. It is a measure of the pressure in your arteries as your heart beats. The second ("bottom") number is called the diastolic pressure. It is a measure of the pressure in your arteries as the heart relaxes. What are the causes? The exact cause of this condition is not known. There are some conditions that result in high blood pressure. What increases the risk? Certain factors may make you more likely to develop high blood pressure. Some of these risk factors are under your control, including: Smoking. Not getting enough exercise or physical activity. Being overweight. Having too much fat, sugar, calories, or salt (sodium) in your diet. Drinking too much alcohol. Other risk factors include: Having a personal history of heart disease, diabetes, high cholesterol, or kidney disease. Stress. Having a family history of high blood pressure and high cholesterol. Having obstructive sleep apnea. Age. The risk increases with age. What are the signs or symptoms? High blood pressure may not cause symptoms. Very high blood pressure (hypertensive crisis) may cause: Headache. Fast or irregular heartbeats (palpitations). Shortness of breath. Nosebleed. Nausea and vomiting. Vision changes. Severe chest pain, dizziness, and seizures. How is this diagnosed? This condition is diagnosed by  measuring your blood pressure while you are seated, with your arm resting on a flat surface, your legs uncrossed, and your feet flat on the floor. The cuff of the blood pressure monitor will be placed directly against the skin of your upper arm at the level of your heart. Blood pressure should be measured at least twice using the same arm. Certain conditions can cause a difference in blood pressure between your right and left arms. If you have a high blood pressure reading during one visit or you have normal blood pressure with other risk factors, you may be asked to: Return on a different day to have your blood pressure checked again. Monitor your blood pressure at home for 1 week or longer. If you are diagnosed with hypertension, you may have other blood or imaging tests to help your health care provider understand your overall risk for other conditions. How is this treated? This condition is treated by making healthy lifestyle changes, such as eating healthy foods, exercising more, and reducing your alcohol intake. You may be referred for counseling on a healthy diet and physical activity. Your health care provider may prescribe medicine if lifestyle changes are not enough to get your blood pressure under control and if: Your systolic blood pressure is above 130. Your diastolic blood pressure is above 80. Your personal target blood pressure may vary depending on your medical conditions, your age, and other factors. Follow these instructions at home: Eating and drinking  Eat a diet that is high in fiber and potassium, and low in sodium, added sugar, and fat. An example of this eating plan is called the DASH diet. DASH stands for Dietary Approaches to Stop Hypertension. To eat this way: Eat   plenty of fresh fruits and vegetables. Try to fill one half of your plate at each meal with fruits and vegetables. Eat whole grains, such as whole-wheat pasta, brown rice, or whole-grain bread. Fill about one  fourth of your plate with whole grains. Eat or drink low-fat dairy products, such as skim milk or low-fat yogurt. Avoid fatty cuts of meat, processed or cured meats, and poultry with skin. Fill about one fourth of your plate with lean proteins, such as fish, chicken without skin, beans, eggs, or tofu. Avoid pre-made and processed foods. These tend to be higher in sodium, added sugar, and fat. Reduce your daily sodium intake. Many people with hypertension should eat less than 1,500 mg of sodium a day. Do not drink alcohol if: Your health care provider tells you not to drink. You are pregnant, may be pregnant, or are planning to become pregnant. If you drink alcohol: Limit how much you have to: 0-1 drink a day for women. 0-2 drinks a day for men. Know how much alcohol is in your drink. In the [place] one drink equals one 12 oz bottle of beer (355 mL), one 5 oz glass of wine (148 mL), or one 1 oz glass of hard liquor (44 mL). Lifestyle  Work with your health care provider to maintain a healthy body weight or to lose weight. Ask what an ideal weight is for you. Get at least 30 minutes of exercise that causes your heart to beat faster (aerobic exercise) most days of the week. Activities may include walking, swimming, or biking. Include exercise to strengthen your muscles (resistance exercise), such as Pilates or lifting weights, as part of your weekly exercise routine. Try to do these types of exercises for 30 minutes at least 3 days a week. Do not use any products that contain nicotine or tobacco. These products include cigarettes, chewing tobacco, and vaping devices, such as e-cigarettes. If you need help quitting, ask your health care provider. Monitor your blood pressure at home as told by your health care provider. Keep all follow-up visits. This is important. Medicines Take over-the-counter and prescription medicines only as told by your health care provider. Follow directions carefully. Blood  pressure medicines must be taken as prescribed. Do not skip doses of blood pressure medicine. Doing this puts you at risk for problems and can make the medicine less effective. Ask your health care provider about side effects or reactions to medicines that you should watch for. Contact a health care provider if you: Think you are having a reaction to a medicine you are taking. Have headaches that keep coming back (recurring). Feel dizzy. Have swelling in your ankles. Have trouble with your vision. Get help right away if you: Develop a severe headache or confusion. Have unusual weakness or numbness. Feel faint. Have severe pain in your chest or abdomen. Vomit repeatedly. Have trouble breathing. These symptoms may be an emergency. Get help right away. Call 911. Do not wait to see if the symptoms will go away. Do not drive yourself to the hospital. Summary Hypertension is when the force of blood pumping through your arteries is too strong. If this condition is not controlled, it may put you at risk for serious complications. Your personal target blood pressure may vary depending on your medical conditions, your age, and other factors. For most people, a normal blood pressure is less than 120/80. Hypertension is treated with lifestyle changes, medicines, or a combination of both. Lifestyle changes include losing weight, eating a healthy,   low-sodium diet, exercising more, and limiting alcohol. This information is not intended to replace advice given to you by your health care provider. Make sure you discuss any questions you have with your health care provider. Document Revised: 10/30/2020 Document Reviewed: 10/30/2020 Elsevier Patient Education  2023 Elsevier Inc.  

## 2022-05-26 ENCOUNTER — Encounter: Payer: Self-pay | Admitting: Internal Medicine

## 2022-05-26 DIAGNOSIS — N182 Chronic kidney disease, stage 2 (mild): Secondary | ICD-10-CM | POA: Insufficient documentation

## 2022-05-27 ENCOUNTER — Other Ambulatory Visit (HOSPITAL_COMMUNITY): Payer: Self-pay

## 2022-05-27 ENCOUNTER — Other Ambulatory Visit: Payer: Self-pay | Admitting: Internal Medicine

## 2022-05-27 ENCOUNTER — Telehealth: Payer: Self-pay

## 2022-05-27 DIAGNOSIS — I10 Essential (primary) hypertension: Secondary | ICD-10-CM

## 2022-05-27 DIAGNOSIS — E559 Vitamin D deficiency, unspecified: Secondary | ICD-10-CM

## 2022-05-27 NOTE — Telephone Encounter (Signed)
Pharmacy Patient Advocate Encounter   Received notification that prior authorization for Eszopiclone 2mg  is required/requested.   PA submitted on 05/27/22 to (ins) [employer] via CoverMyMeds Key  # [id number] Status is pending

## 2022-05-28 NOTE — Telephone Encounter (Signed)
Patient Advocate Encounter  Prior Authorization for Eszopiclone 2mg  has been approved with [employer].    PA#  [id number] Effective dates: 05/27/22 through 05/27/23  Approval letter attached to chart

## 2022-06-05 DIAGNOSIS — Z0100 Encounter for examination of eyes and vision without abnormal findings: Secondary | ICD-10-CM | POA: Diagnosis not present

## 2022-06-09 ENCOUNTER — Encounter: Payer: Self-pay | Admitting: Internal Medicine

## 2022-06-26 ENCOUNTER — Other Ambulatory Visit: Payer: Self-pay | Admitting: Internal Medicine

## 2022-06-26 DIAGNOSIS — I10 Essential (primary) hypertension: Secondary | ICD-10-CM

## 2022-06-26 MED ORDER — TRIAMTERENE 50 MG PO CAPS
50.0000 mg | ORAL_CAPSULE | Freq: Every day | ORAL | 0 refills | Status: DC
Start: 2022-06-26 — End: 2022-06-30

## 2022-06-27 ENCOUNTER — Other Ambulatory Visit: Payer: Self-pay | Admitting: Internal Medicine

## 2022-06-27 DIAGNOSIS — I10 Essential (primary) hypertension: Secondary | ICD-10-CM

## 2022-07-01 NOTE — Progress Notes (Unsigned)
  Cardiology Office Note:   Date:  07/01/2022  ID:  [name], DOB 1955/11/04, MRN [id number] PCP: Etta Grandchild, MD  [facility] Providers Cardiologist:  None {  History of Present Illness:   [name] is a 67 y.o. female who presents for evaluation of an abnormal EKG.  ***  ROS: ***  Studies Reviewed:    EKG:       ***  Risk Assessment/Calculations:   {Does this patient have ATRIAL FIBRILLATION?:[phone]} No BP recorded.  {Refresh Note OR Click here to enter BP  :1}***        Physical Exam:   VS:  There were no vitals taken for this visit.   Wt Readings from Last 3 Encounters:  05/19/22 248 lb (112.5 kg)  11/26/21 260 lb (117.9 kg)  10/28/21 252 lb 4 oz (114.4 kg)     GEN: Well nourished, well developed in no acute distress NECK: No JVD; No carotid bruits CARDIAC: ***RRR, no murmurs, rubs, gallops RESPIRATORY:  Clear to auscultation without rales, wheezing or rhonchi  ABDOMEN: Soft, non-tender, non-distended EXTREMITIES:  No edema; No deformity   ASSESSMENT AND PLAN:   Abnormal EKG:      {Are you ordering a CV Procedure (e.g. stress test, cath, DCCV, TEE, etc)?   Press F2        :[phone]}  Follow up ***  Signed, Rollene Rotunda, MD

## 2022-07-03 ENCOUNTER — Encounter: Payer: Self-pay | Admitting: Cardiology

## 2022-07-03 ENCOUNTER — Ambulatory Visit: Payer: No Typology Code available for payment source | Attending: Cardiology | Admitting: Cardiology

## 2022-07-03 VITALS — BP 138/86 | HR 90 | Ht 63.0 in | Wt 253.0 lb

## 2022-07-03 DIAGNOSIS — R9431 Abnormal electrocardiogram [ECG] [EKG]: Secondary | ICD-10-CM | POA: Diagnosis not present

## 2022-07-03 DIAGNOSIS — R0602 Shortness of breath: Secondary | ICD-10-CM

## 2022-07-03 NOTE — Patient Instructions (Signed)
Medication Instructions:  NO CHANGES  *If you need a refill on your cardiac medications before your next appointment, please call your pharmacy*   Lab Work: BMET, BNP today  If you have labs (blood work) drawn today and your tests are completely normal, you will receive your results only by: MyChart Message (if you have MyChart) OR A paper copy in the mail If you have any lab test that is abnormal or we need to change your treatment, we will call you to review the results.   Testing/Procedures: Your physician has requested that you have an echocardiogram. Echocardiography is a painless test that uses sound waves to create images of your heart. It provides your doctor with information about the size and shape of your heart and how well your heart's chambers and valves are working. This procedure takes approximately one hour. There are no restrictions for this procedure. Please do NOT wear cologne, perfume, aftershave, or lotions (deodorant is allowed). Please arrive 15 minutes prior to your appointment time.    Follow-Up: At [facility], you and your health needs are our priority.  As part of our continuing mission to provide you with exceptional heart care, we have created designated Provider Care Teams.  These Care Teams include your primary Cardiologist (physician) and Advanced Practice Providers (APPs -  Physician Assistants and Nurse Practitioners) who all work together to provide you with the care you need, when you need it.  We recommend signing up for the patient portal called "MyChart".  Sign up information is provided on this After Visit Summary.  MyChart is used to connect with patients for Virtual Visits (Telemedicine).  Patients are able to view lab/test results, encounter notes, upcoming appointments, etc.  Non-urgent messages can be sent to your provider as well.   To learn more about what you can do with MyChart, go to [email or web address].    Your next  appointment:   AS NEEDED with Dr. Antoine Poche

## 2022-07-04 LAB — BASIC METABOLIC PANEL
BUN/Creatinine Ratio: 11 — ABNORMAL LOW (ref 12–28)
BUN: 9 mg/dL (ref 8–27)
CO2: 26 mmol/L (ref 20–29)
Calcium: 9.3 mg/dL (ref 8.7–10.3)
Chloride: 104 mmol/L (ref 96–106)
Creatinine, Ser: 0.84 mg/dL (ref 0.57–1.00)
Glucose: 81 mg/dL (ref 70–99)
Potassium: 3.8 mmol/L (ref 3.5–5.2)
Sodium: 143 mmol/L (ref 134–144)
eGFR: 77 mL/min/{1.73_m2} (ref >59)

## 2022-07-04 LAB — BRAIN NATRIURETIC PEPTIDE: BNP: 33.1 pg/mL (ref 0.0–100.0)

## 2022-07-07 ENCOUNTER — Encounter: Payer: Self-pay | Admitting: *Deleted

## 2022-07-24 ENCOUNTER — Other Ambulatory Visit: Payer: Self-pay | Admitting: Internal Medicine

## 2022-07-24 DIAGNOSIS — Z9884 Bariatric surgery status: Secondary | ICD-10-CM

## 2022-07-24 DIAGNOSIS — E519 Thiamine deficiency, unspecified: Secondary | ICD-10-CM

## 2022-07-31 ENCOUNTER — Ambulatory Visit (HOSPITAL_COMMUNITY): Payer: No Typology Code available for payment source | Attending: Cardiology

## 2022-07-31 DIAGNOSIS — R9431 Abnormal electrocardiogram [ECG] [EKG]: Secondary | ICD-10-CM | POA: Diagnosis not present

## 2022-07-31 LAB — ECHOCARDIOGRAM COMPLETE
Area-P 1/2: 2.93 cm2
S' Lateral: 3.1 cm

## 2022-08-08 ENCOUNTER — Other Ambulatory Visit: Payer: Self-pay | Admitting: Internal Medicine

## 2022-08-08 DIAGNOSIS — E2839 Other primary ovarian failure: Secondary | ICD-10-CM

## 2022-08-14 ENCOUNTER — Other Ambulatory Visit: Payer: Self-pay | Admitting: Internal Medicine

## 2022-08-14 DIAGNOSIS — F418 Other specified anxiety disorders: Secondary | ICD-10-CM

## 2022-08-21 ENCOUNTER — Other Ambulatory Visit: Payer: Self-pay | Admitting: Internal Medicine

## 2022-08-21 DIAGNOSIS — E559 Vitamin D deficiency, unspecified: Secondary | ICD-10-CM

## 2022-08-28 ENCOUNTER — Other Ambulatory Visit: Payer: Self-pay | Admitting: Internal Medicine

## 2022-08-28 DIAGNOSIS — K2100 Gastro-esophageal reflux disease with esophagitis, without bleeding: Secondary | ICD-10-CM

## 2022-09-01 ENCOUNTER — Other Ambulatory Visit: Payer: Self-pay | Admitting: Internal Medicine

## 2022-09-01 ENCOUNTER — Encounter: Payer: Self-pay | Admitting: Internal Medicine

## 2022-09-01 DIAGNOSIS — E559 Vitamin D deficiency, unspecified: Secondary | ICD-10-CM

## 2022-09-01 MED ORDER — VITAMIN D (ERGOCALCIFEROL) 1.25 MG (50000 UNIT) PO CAPS: 50000.0000 [IU] | ORAL_CAPSULE | ORAL | 0 refills | Status: DC

## 2022-09-04 ENCOUNTER — Other Ambulatory Visit: Payer: Self-pay | Admitting: Internal Medicine

## 2022-09-04 DIAGNOSIS — J4520 Mild intermittent asthma, uncomplicated: Secondary | ICD-10-CM

## 2022-09-05 ENCOUNTER — Ambulatory Visit
Admission: EM | Admit: 2022-09-05 | Discharge: 2022-09-05 | Disposition: A | Discharge status: Home / Self Care | Payer: No Typology Code available for payment source

## 2022-09-05 DIAGNOSIS — J45901 Unspecified asthma with (acute) exacerbation: Secondary | ICD-10-CM | POA: Diagnosis not present

## 2022-09-05 MED ORDER — ALBUTEROL SULFATE HFA 108 (90 BASE) MCG/ACT IN AERS: 1.0000 | INHALATION_SPRAY | Freq: Four times a day (QID) | RESPIRATORY_TRACT | 0 refills | Status: DC | PRN

## 2022-09-05 MED ORDER — PREDNISONE 20 MG PO TABS: 40.0000 mg | ORAL_TABLET | Freq: Every day | ORAL | 0 refills | Status: AC

## 2022-09-05 MED ORDER — AMOXICILLIN-POT CLAVULANATE 875-125 MG PO TABS: 1.0000 | ORAL_TABLET | Freq: Two times a day (BID) | ORAL | 0 refills | Status: DC

## 2022-09-05 NOTE — ED Provider Notes (Signed)
[facility]    CSN: [id number] Arrival date & time: 09/05/22  1733      History   Chief Complaint Chief Complaint  Patient presents with   Cough   Shortness of Breath    HPI [name] is a 67 y.o. female.   Patient here today for evaluation of cough and shortness of breath.  She states that when she is having a coughing fit she has some shortness of breath associated with that.  She denies any fever.  She has not had any sore throat.  She denies any vomiting or diarrhea.  She does have history of asthma and states she needs a refill on her inhaler.  The history is provided by the patient.  Cough Associated symptoms: shortness of breath   Associated symptoms: no chills, no eye discharge, no fever and no sore throat   Shortness of Breath Associated symptoms: cough   Associated symptoms: no abdominal pain, no fever, no sore throat and no vomiting     Past Medical History:  Diagnosis Date   Anxiety disorder    Chronic cough    Complication of anesthesia    Had ERCP in 2006 and led to respiratory failure, caused pancreatits, was in coma for 30days.   Depression    GERD (gastroesophageal reflux disease)    History of ERCP    History of pancreatitis    History of tracheostomy    HTN (hypertension)    Hyperglycemia    Hypokalemia    Respiratory failure (HCC)    Sleep apnea    CPAP    Patient Active Problem List   Diagnosis Date Noted   Chronic kidney disease, stage 2, mildly decreased GFR 05/26/2022   Prolonged Q-T interval on ECG 05/19/2022   Iron deficiency 05/19/2022   Folate deficiency 05/19/2022   Intrinsic eczema 10/28/2021   Insulin resistance syndrome 07/29/2021   Estrogen deficiency 05/21/2021   Stage 3a chronic kidney disease (HCC) 05/21/2021   Chronic idiopathic constipation 08/28/2020   Moderate episode of recurrent major depressive disorder (HCC) 08/28/2020   GERD with esophagitis 08/15/2019   Prediabetes 08/15/2019   Thiamine  deficiency 02/14/2019   Zinc deficiency 02/12/2019   LAP-BAND surgery status 01/20/2018   SUI (stress urinary incontinence, female) 10/13/2017   Abnormal electrocardiogram (ECG) (EKG) 10/13/2017   Multiple pulmonary nodules determined by computed tomography of lung 10/13/2017   B12 deficiency anemia 10/23/2015   Insomnia with sleep apnea 10/23/2015   Asthma, mild intermittent 06/27/2015   Primary osteoarthritis of both knees 04/26/2015   OAB (overactive bladder) 01/18/2014   Routine general medical examination at a health care facility 07/18/2013   Hyperlipidemia with target LDL less than 130 05/04/2012   Diuretic-induced hypokalemia 08/16/2011   Obesity, Class III, BMI 40-49.9 (morbid obesity) (HCC) 05/13/2011   OSA (obstructive sleep apnea) 01/22/2011   Allergic rhinitis 09/30/2008   Vitamin D deficiency 06/15/2008   Essential hypertension 06/01/2008    Past Surgical History:  Procedure Laterality Date   ABDOMINAL HYSTERECTOMY     Total Hysterectomy    APPENDECTOMY     BREAST REDUCTION SURGERY     CHOLECYSTECTOMY     COLONOSCOPY WITH PROPOFOL N/A 12/21/2015   Procedure: COLONOSCOPY WITH PROPOFOL;  Surgeon: Jeani Hawking, MD;  Location: [facility];  Service: Endoscopy;  Laterality: N/A;   ERCP     ESOPHAGOGASTRODUODENOSCOPY N/A 12/21/2015   Procedure: ESOPHAGOGASTRODUODENOSCOPY (EGD);  Surgeon: Jeani Hawking, MD;  Location: [facility];  Service: Endoscopy;  Laterality: N/A;  GIVENS CAPSULE STUDY N/A 01/29/2016   Procedure: GIVENS CAPSULE STUDY;  Surgeon: Charna Elizabeth, MD;  Location: [facility];  Service: Endoscopy;  Laterality: N/A;   HAMMER TOE SURGERY     INTESTINAL MALROTATION REPAIR  1999   TUBAL LIGATION      OB History   No obstetric history on file.      Home Medications    Prior to Admission medications   Medication Sig Start Date End Date Taking? Authorizing Provider  albuterol (VENTOLIN HFA) 108 (90 Base) MCG/ACT inhaler Inhale 1-2 puffs into the lungs  every 6 (six) hours as needed for wheezing or shortness of breath. 09/05/22  Yes Tomi Bamberger, PA-C  amoxicillin-clavulanate (AUGMENTIN) 875-125 MG tablet Take 1 tablet by mouth every 12 (twelve) hours. 09/05/22  Yes Tomi Bamberger, PA-C  estradiol (ESTRACE) 0.1 MG/GM vaginal cream PLEASE SEE ATTACHED FOR DETAILED DIRECTIONS 08/10/22  Yes [provider]  predniSONE (DELTASONE) 20 MG tablet Take 2 tablets (40 mg total) by mouth daily with breakfast for 5 days. 09/05/22 09/10/22 Yes Tomi Bamberger, PA-C  buPROPion (WELLBUTRIN XL) 150 MG 24 hr tablet TAKE 1 TABLET BY MOUTH EVERY DAY Patient not taking: Reported on 07/03/2022 04/16/22   Etta Grandchild, MD  DULoxetine (CYMBALTA) 60 MG capsule TAKE 1 CAPSULE BY MOUTH EVERY DAY 08/14/22   Etta Grandchild, MD  esomeprazole (NEXIUM) 40 MG capsule TAKE 1 CAPSULE (40 MG TOTAL) BY MOUTH DAILY AT 12 NOON. 08/28/22   Etta Grandchild, MD  eszopiclone (LUNESTA) 2 MG TABS tablet Take 1 tablet (2 mg total) by mouth at bedtime as needed for sleep. Take immediately before bedtime 05/19/22   Etta Grandchild, MD  fluocinonide-emollient (LIDEX-E) 0.05 % cream Apply 1 Application topically 2 (two) times daily. 10/28/21   Etta Grandchild, MD  fluticasone-salmeterol [facility] INHUB) 250-50 MCG/ACT AEPB Inhale 1 puff into the lungs in the morning and at bedtime. 09/05/22   Etta Grandchild, MD  folic acid (FOLVITE) 1 MG tablet Take 1 tablet (1 mg total) by mouth daily. 05/19/22   Etta Grandchild, MD  indapamide (LOZOL) 1.25 MG tablet TAKE 1 TABLET BY MOUTH EVERY DAY Patient not taking: Reported on 07/03/2022 05/27/22   Etta Grandchild, MD  LINZESS 290 MCG CAPS capsule TAKE 1 CAPSULE (290 MCG TOTAL) BY MOUTH EVERY MORNING 04/27/22   Etta Grandchild, MD  potassium chloride (KLOR-CON M10) 10 MEQ tablet TAKE 1 TABLET (10 MEQ TOTAL) BY MOUTH 3 (THREE) TIMES DAILY. 05/19/22   Etta Grandchild, MD  thiamine (VITAMIN B1) 100 MG tablet TAKE 1 TABLET BY MOUTH EVERY DAY 07/24/22   Etta Grandchild, MD  triamterene (DYRENIUM) 50 MG capsule TAKE 1 CAPSULE BY MOUTH EVERY DAY 06/30/22   Etta Grandchild, MD  Vitamin D, Ergocalciferol, (DRISDOL) 1.25 MG (50000 UNIT) CAPS capsule Take 1 capsule (50,000 Units total) by mouth every 7 (seven) days. 09/01/22   Etta Grandchild, MD  Zinc 50 MG TABS TAKE 1 TABLET BY MOUTH EVERY OTHER DAY. 05/17/22   Etta Grandchild, MD  promethazine (PHENERGAN) 25 MG tablet Take 1 tablet (25 mg total) by mouth every 6 (six) hours as needed for nausea or vomiting. 02/20/15 02/20/15  Rolland Porter, MD    Family History Family History  Problem Relation Age of Onset   Diabetes Other    Hypertension Other     Social History Social History   Tobacco Use   Smoking status: Never  Smokeless tobacco: Never  Vaping Use   Vaping status: Never Used  Substance Use Topics   Alcohol use: No   Drug use: No     Allergies   Fastin [phentermine], Erythromycin, and Lisinopril   Review of Systems Review of Systems  Constitutional:  Negative for chills and fever.  HENT:  Negative for congestion and sore throat.   Eyes:  Negative for discharge and redness.  Respiratory:  Positive for cough and shortness of breath.   Gastrointestinal:  Negative for abdominal pain, nausea and vomiting.     Physical Exam Triage Vital Signs ED Triage Vitals  Encounter Vitals Group     BP      Systolic BP Percentile      Diastolic BP Percentile      Pulse      Resp      Temp      Temp src      SpO2      Weight      Height      Head Circumference      Peak Flow      Pain Score      Pain Loc      Pain Education      Exclude from Growth Chart    No data found.  Updated Vital Signs BP (!) 156/80 (BP Location: Left Arm)   Pulse (!) 103   Temp 99.2 F (37.3 C) (Oral)   Resp 16   SpO2 95%      Physical Exam Vitals and nursing note reviewed.  Constitutional:      General: She is not in acute distress.    Appearance: Normal appearance. She is not ill-appearing.   HENT:     Head: Normocephalic and atraumatic.     Nose: Nose normal. No congestion or rhinorrhea.     Mouth/Throat:     Mouth: Mucous membranes are moist.     Pharynx: Oropharynx is clear.  Eyes:     Conjunctiva/sclera: Conjunctivae normal.  Cardiovascular:     Rate and Rhythm: Normal rate and regular rhythm.     Heart sounds: Normal heart sounds.  Pulmonary:     Effort: Pulmonary effort is normal. No respiratory distress.     Breath sounds: Wheezing present. No rhonchi.     Comments: Scattered wheeze- increased cough with deep breathing Neurological:     Mental Status: She is alert.  Psychiatric:        Mood and Affect: Mood normal.        Behavior: Behavior normal.        Thought Content: Thought content normal.      UC Treatments / Results  Labs (all labs ordered are listed, but only abnormal results are displayed) Labs Reviewed - No data to display  EKG   Radiology No results found.  Procedures Procedures (including critical care time)  Medications Ordered in UC Medications - No data to display  Initial Impression / Assessment and Plan / UC Course  I have reviewed the triage vital signs and the nursing notes.  Pertinent labs & imaging results that were available during my care of the patient were reviewed by me and considered in my medical decision making (see chart for details).    Albuterol inhaler refilled and will treat with steroid and antibiotic prescribed to cover asthma exacerbation. Encouraged follow up if no gradual improvement or ED with any worsening symptoms.   Final Clinical Impressions(s) / UC Diagnoses   Final diagnoses:  Asthma with acute exacerbation, unspecified asthma severity, unspecified whether persistent   Discharge Instructions   None    ED Prescriptions     Medication Sig Dispense Auth. Provider   albuterol (VENTOLIN HFA) 108 (90 Base) MCG/ACT inhaler Inhale 1-2 puffs into the lungs every 6 (six) hours as needed for  wheezing or shortness of breath. 18 g Erma Pinto F, PA-C   predniSONE (DELTASONE) 20 MG tablet Take 2 tablets (40 mg total) by mouth daily with breakfast for 5 days. 10 tablet Tomi Bamberger, PA-C   amoxicillin-clavulanate (AUGMENTIN) 875-125 MG tablet Take 1 tablet by mouth every 12 (twelve) hours. 14 tablet Tomi Bamberger, PA-C      PDMP not reviewed this encounter.   Tomi Bamberger, PA-C 09/05/22 [phone]

## 2022-09-05 NOTE — ED Triage Notes (Signed)
Patient present to UC for cough since yesterday. SOB when having a coughing spell. Needs a refill on her inhaler. Not taking any OTC meds.

## 2022-09-17 ENCOUNTER — Encounter (HOSPITAL_BASED_OUTPATIENT_CLINIC_OR_DEPARTMENT_OTHER): Payer: Self-pay | Admitting: Pulmonary Disease

## 2022-09-17 ENCOUNTER — Ambulatory Visit (HOSPITAL_BASED_OUTPATIENT_CLINIC_OR_DEPARTMENT_OTHER): Payer: No Typology Code available for payment source

## 2022-09-17 ENCOUNTER — Ambulatory Visit (HOSPITAL_BASED_OUTPATIENT_CLINIC_OR_DEPARTMENT_OTHER): Payer: No Typology Code available for payment source | Admitting: Pulmonary Disease

## 2022-09-17 VITALS — BP 152/90 | HR 92 | Ht 63.0 in | Wt 252.0 lb

## 2022-09-17 DIAGNOSIS — G4733 Obstructive sleep apnea (adult) (pediatric): Secondary | ICD-10-CM

## 2022-09-17 DIAGNOSIS — R053 Chronic cough: Secondary | ICD-10-CM

## 2022-09-17 MED ORDER — AZITHROMYCIN 250 MG PO TABS: ORAL_TABLET | ORAL | 0 refills | Status: DC

## 2022-09-17 NOTE — Assessment & Plan Note (Signed)
Onset/2024.  She has had this cough in the past which makes me suspect postnasal drip/upper airway cough syndrome but this time onset was related to "swallowing something wrong".  Will obtain chest x-ray to clarify. Recent episode of acute bronchitis was treated with Augmentin and prednisone.  She does not have bronchospasm today and does not seem to require more steroids.  I have asked her to resume Wixela twice daily and use albuterol on an as-needed basis

## 2022-09-17 NOTE — Progress Notes (Signed)
   Subjective:    Patient ID: [name], female    DOB: 10/26/1955, 67 y.o.   MRN: [id number]  HPI 67 yo woman for FU of OSA & lung nodules She has lived in [place] for 15 years, in [place] for 12 years and [place], [place] for about 2 years   PMH - She required ERCP for acute pancreatitis foll by resp failure requiring tstomy  Lap band sx in 12/2008 , 01/2010 band flipped, required surgery, regained wt lost She underwent TAH + BSO in '92   Annual follow-up visit. She has an new problem of chronic cough, onset 04/2022 when she feels she swallowed something wrong.  She developed cough incontinence after few months.  She tried OTC cough medication.  She developed a cold/URI symptoms 2 weeks ago had an urgent care visit and was felt to be having an asthma exacerbation with some wheezing.  She was given amoxicillin and prednisone.  Her sputum has turned white, no fevers.  She was not COVID tested. She now states that she blows her nose and her voice is hoarse cough is persistent. Advair was not covered on her insurance and she was switched to [facility] but she has stopped taking this and is only taking albuterol   Her CPAP is more than 67 years old and she inquires about inspire device.  She continues to have a large mask leak and cough has interfered with her CPAP use.    Significant tests/ events reviewed   CT chest w con 02/2018 >> stable nodules   Urine histo neg ACE 11/2017  50 CT chest without contrast 10/2017 - multiple subcentimeter nodules scattered throughout both lungs, largest about 7 mm in size.  Chronic elevation right hemidiaphragm     PSG 2006 (Wt 264)>> severe OSA with AHI 52/h including 50 obstructive apneas & 81 hypopneas corrected by 14 cm , nadir desatn to 69%.    PSG 02/2011 - 269 lbs - Severe  with hypopneas AHI 56/h causing sleep problems, fragmentation, and oxygen desaturation >> CPAP of 15 cm , small full- face mask    Review of Systems neg for  any significant sore throat, dysphagia, itching, sneezing, nasal congestion or excess/ purulent secretions, fever, chills, sweats, unintended wt loss, pleuritic or exertional cp, hempoptysis, orthopnea pnd or change in chronic leg swelling. Also denies presyncope, palpitations, heartburn, abdominal pain, nausea, vomiting, diarrhea or change in bowel or urinary habits, dysuria,hematuria, rash, arthralgias, visual complaints, headache, numbness weakness or ataxia.     Objective:   Physical Exam   Gen. Pleasant, obese, in no distress ENT - no lesions, no post nasal drip Neck: No JVD, no thyromegaly, no carotid bruits Lungs: no use of accessory muscles, no dullness to percussion, decreased without rales or rhonchi  Cardiovascular: Rhythm regular, heart sounds  normal, no murmurs or gallops, no peripheral edema Musculoskeletal: No deformities, no cyanosis or clubbing , no tremors      Assessment & Plan:

## 2022-09-17 NOTE — Patient Instructions (Addendum)
X CXR today for cough  Take wixela twice daily  Use albuterol as needed in between  X schedule mask fitting session by calling [phone]  OK to take delsym 5ml at bedtime If cough persists, take sinus PE - OTC medication [facility] nightly x 2 weeks   Call if no better

## 2022-09-17 NOTE — Assessment & Plan Note (Signed)
CPAP download was reviewed which shows good control of events on auto settings 10 to 15 cm with average pressure of 14.  She is very compliant more than 8 hours per night with a large leak.  He has a medium AirFit F30 fullface mask and I will switch her to a small also asked her to schedule mask fitting session She is not a candidate for hypoglossal nerve stimulation therapy due to obesity. Weight loss encouraged, compliance with goal of at least 4-6 hrs every night is the expectation. Advised against medications with sedative side effects Cautioned against driving when sleepy - understanding that sleepiness will vary on a day to day basis

## 2022-09-22 ENCOUNTER — Encounter (HOSPITAL_BASED_OUTPATIENT_CLINIC_OR_DEPARTMENT_OTHER): Payer: Self-pay | Admitting: Pulmonary Disease

## 2022-09-23 ENCOUNTER — Telehealth: Payer: Self-pay | Admitting: Pulmonary Disease

## 2022-09-23 NOTE — Telephone Encounter (Signed)
Pt calling in bc she is not been feeling better: coughing really bad. Left message on MyChart and no one answered. Mucus turned from [facility] to yellow. Offered appt but can't accept due to work  [facility] [address]

## 2022-09-26 NOTE — Telephone Encounter (Signed)
She needs to try taking sinus PE [facility] for 2 weeks

## 2022-09-26 NOTE — Telephone Encounter (Signed)
Lauretta Grill A   [facility]   09/23/22  9:22 AM Note Pt calling in bc she is not been feeling better: coughing really bad. Left message on MyChart and no one answered. Mucus turned from [facility] to yellow. Offered appt but can't accept due to work  [facility] [address]

## 2022-09-29 NOTE — Telephone Encounter (Signed)
Patient is returning phone call. Patient phone number is [phone].

## 2022-09-29 NOTE — Telephone Encounter (Signed)
ATC patient with Dr. Reginia Naas recommendations.  LM to call back.

## 2022-09-30 ENCOUNTER — Encounter: Payer: Self-pay | Admitting: *Deleted

## 2022-09-30 ENCOUNTER — Other Ambulatory Visit: Payer: Self-pay | Admitting: *Deleted

## 2022-09-30 MED ORDER — AMOXICILLIN-POT CLAVULANATE 875-125 MG PO TABS: 1.0000 | ORAL_TABLET | Freq: Two times a day (BID) | ORAL | 0 refills | Status: DC

## 2022-09-30 NOTE — Telephone Encounter (Signed)
Sent patient mychart message as we have been unable to reach her by phone.  [facility] to [facility] [address].

## 2022-10-01 NOTE — Telephone Encounter (Signed)
This message has been addressed via my chart

## 2022-10-26 ENCOUNTER — Other Ambulatory Visit: Payer: Self-pay | Admitting: Internal Medicine

## 2022-10-26 DIAGNOSIS — L2084 Intrinsic (allergic) eczema: Secondary | ICD-10-CM

## 2022-11-13 ENCOUNTER — Other Ambulatory Visit: Payer: Self-pay | Admitting: Internal Medicine

## 2022-11-13 DIAGNOSIS — E538 Deficiency of other specified B group vitamins: Secondary | ICD-10-CM

## 2022-11-13 DIAGNOSIS — D510 Vitamin B12 deficiency anemia due to intrinsic factor deficiency: Secondary | ICD-10-CM

## 2022-11-14 ENCOUNTER — Other Ambulatory Visit: Payer: Self-pay | Admitting: Internal Medicine

## 2022-11-14 DIAGNOSIS — E876 Hypokalemia: Secondary | ICD-10-CM

## 2022-11-14 DIAGNOSIS — I10 Essential (primary) hypertension: Secondary | ICD-10-CM

## 2022-11-19 ENCOUNTER — Ambulatory Visit: Payer: No Typology Code available for payment source | Admitting: Internal Medicine

## 2022-11-19 ENCOUNTER — Encounter: Payer: Self-pay | Admitting: Internal Medicine

## 2022-11-19 VITALS — BP 154/78 | HR 100 | Temp 98.3°F | Resp 16 | Ht 63.0 in | Wt 251.8 lb

## 2022-11-19 DIAGNOSIS — I10 Essential (primary) hypertension: Secondary | ICD-10-CM | POA: Diagnosis not present

## 2022-11-19 DIAGNOSIS — D510 Vitamin B12 deficiency anemia due to intrinsic factor deficiency: Secondary | ICD-10-CM | POA: Diagnosis not present

## 2022-11-19 DIAGNOSIS — E785 Hyperlipidemia, unspecified: Secondary | ICD-10-CM | POA: Diagnosis not present

## 2022-11-19 DIAGNOSIS — E538 Deficiency of other specified B group vitamins: Secondary | ICD-10-CM

## 2022-11-19 DIAGNOSIS — Z23 Encounter for immunization: Secondary | ICD-10-CM | POA: Diagnosis not present

## 2022-11-19 DIAGNOSIS — E519 Thiamine deficiency, unspecified: Secondary | ICD-10-CM

## 2022-11-19 DIAGNOSIS — Z0001 Encounter for general adult medical examination with abnormal findings: Secondary | ICD-10-CM | POA: Insufficient documentation

## 2022-11-19 DIAGNOSIS — E60 Dietary zinc deficiency: Secondary | ICD-10-CM

## 2022-11-19 DIAGNOSIS — Z0000 Encounter for general adult medical examination without abnormal findings: Secondary | ICD-10-CM

## 2022-11-19 DIAGNOSIS — Z9884 Bariatric surgery status: Secondary | ICD-10-CM | POA: Insufficient documentation

## 2022-11-19 LAB — HEPATIC FUNCTION PANEL
ALT: 15 U/L (ref 0–35)
AST: 16 U/L (ref 0–37)
Albumin: 4.2 g/dL (ref 3.5–5.2)
Alkaline Phosphatase: 146 U/L — ABNORMAL HIGH (ref 39–117)
Bilirubin, Direct: 0 mg/dL (ref 0.0–0.3)
Total Bilirubin: 0.3 mg/dL (ref 0.2–1.2)
Total Protein: 7.8 g/dL (ref 6.0–8.3)

## 2022-11-19 LAB — BASIC METABOLIC PANEL
BUN: 10 mg/dL (ref 6–23)
CO2: 28 meq/L (ref 19–32)
Calcium: 9.5 mg/dL (ref 8.4–10.5)
Chloride: 107 meq/L (ref 96–112)
Creatinine, Ser: 0.98 mg/dL (ref 0.40–1.20)
GFR: 59.86 mL/min — ABNORMAL LOW (ref >60.00)
Glucose, Bld: 82 mg/dL (ref 70–99)
Potassium: 3.9 meq/L (ref 3.5–5.1)
Sodium: 144 meq/L (ref 135–145)

## 2022-11-19 LAB — CBC WITH DIFFERENTIAL/PLATELET
Basophils Absolute: 0.1 10*3/uL (ref 0.0–0.1)
Basophils Relative: 0.6 % (ref 0.0–3.0)
Eosinophils Absolute: 0.2 10*3/uL (ref 0.0–0.7)
Eosinophils Relative: 2.8 % (ref 0.0–5.0)
HCT: 39.4 % (ref 36.0–46.0)
Hemoglobin: 12.5 g/dL (ref 12.0–15.0)
Lymphocytes Relative: 22.9 % (ref 12.0–46.0)
Lymphs Abs: 2 10*3/uL (ref 0.7–4.0)
MCHC: 31.6 g/dL (ref 30.0–36.0)
MCV: 88.1 fL (ref 78.0–100.0)
Monocytes Absolute: 0.5 10*3/uL (ref 0.1–1.0)
Monocytes Relative: 6.4 % (ref 3.0–12.0)
Neutro Abs: 5.7 10*3/uL (ref 1.4–7.7)
Neutrophils Relative %: 67.3 % (ref 43.0–77.0)
Platelets: 343 10*3/uL (ref 150.0–400.0)
RBC: 4.47 Mil/uL (ref 3.87–5.11)
RDW: 15.7 % — ABNORMAL HIGH (ref 11.5–15.5)
WBC: 8.5 10*3/uL (ref 4.0–10.5)

## 2022-11-19 LAB — LIPID PANEL
Cholesterol: 181 mg/dL (ref 0–200)
HDL: 60.2 mg/dL (ref >39.00)
LDL Cholesterol: 105 mg/dL — ABNORMAL HIGH (ref 0–99)
NonHDL: 121.26
Total CHOL/HDL Ratio: 3
Triglycerides: 81 mg/dL (ref 0.0–149.0)
VLDL: 16.2 mg/dL (ref 0.0–40.0)

## 2022-11-19 LAB — VITAMIN B12: Vitamin B-12: 291 pg/mL (ref 211–911)

## 2022-11-19 LAB — URINALYSIS, ROUTINE W REFLEX MICROSCOPIC
Bilirubin Urine: NEGATIVE
Hgb urine dipstick: NEGATIVE
Ketones, ur: NEGATIVE
Leukocytes,Ua: NEGATIVE
Nitrite: NEGATIVE
RBC / HPF: NONE SEEN (ref 0–?)
Specific Gravity, Urine: 1.02 (ref 1.000–1.030)
Total Protein, Urine: NEGATIVE
Urine Glucose: NEGATIVE
Urobilinogen, UA: 0.2 (ref 0.0–1.0)
pH: 6 (ref 5.0–8.0)

## 2022-11-19 LAB — TSH: TSH: 2.25 u[IU]/mL (ref 0.35–5.50)

## 2022-11-19 MED ORDER — NEBIVOLOL HCL 5 MG PO TABS
5.0000 mg | ORAL_TABLET | Freq: Every day | ORAL | 0 refills | Status: DC
Start: 2022-11-19 — End: 2023-02-14

## 2022-11-19 MED ORDER — SHINGRIX 50 MCG/0.5ML IM SUSR: 0.5000 mL | Freq: Once | INTRAMUSCULAR | 1 refills | Status: AC

## 2022-11-19 MED ORDER — ZINC 50 MG PO TABS: 1.0000 | ORAL_TABLET | ORAL | 1 refills | Status: DC

## 2022-11-19 MED ORDER — THIAMINE HCL 100 MG PO TABS: 100.0000 mg | ORAL_TABLET | Freq: Every day | ORAL | 1 refills | Status: DC

## 2022-11-19 MED ORDER — FOLIC ACID 1 MG PO TABS: 1.0000 mg | ORAL_TABLET | Freq: Every day | ORAL | 0 refills | Status: DC

## 2022-11-19 NOTE — Patient Instructions (Signed)

## 2022-11-19 NOTE — Progress Notes (Signed)
Subjective:  Patient ID: [name], female    DOB: 06-Feb-1955  Age: 67 y.o. MRN: [id number]  CC: Annual Exam, Hypertension, and Hyperlipidemia   HPI Lanyla Benke presents for a CPX and f/up ----   Discussed the use of AI scribe software for clinical note transcription with the patient, who gave verbal consent to proceed.  History of Present Illness   The patient, with a history of hypertension, presents with daily headaches for the past three weeks. She attributes these headaches to stress, related to recent familial losses and ongoing legal matters. Associated symptoms include nausea but no blurred, painful vision. She denies chest pain, shortness of breath, or vomiting.   The patient also mentions a recent course of amoxicillin, prescribed by her dentist, which she completed last week. She expresses a belief that her nausea may be related to her folic acid medication.       Outpatient Medications Prior to Visit  Medication Sig Dispense Refill   albuterol (VENTOLIN HFA) 108 (90 Base) MCG/ACT inhaler Inhale 1-2 puffs into the lungs every 6 (six) hours as needed for wheezing or shortness of breath. 18 g 0   DULoxetine (CYMBALTA) 60 MG capsule TAKE 1 CAPSULE BY MOUTH EVERY DAY 90 capsule 1   esomeprazole (NEXIUM) 40 MG capsule TAKE 1 CAPSULE (40 MG TOTAL) BY MOUTH DAILY AT 12 NOON. 90 capsule 1   estradiol (ESTRACE) 0.1 MG/GM vaginal cream PLEASE SEE ATTACHED FOR DETAILED DIRECTIONS     fluocinonide-emollient (LIDEX-E) 0.05 % cream APPLY TO AFFECTED AREA TWICE A DAY (Patient taking differently: as needed.) 60 g 1   fluticasone-salmeterol (WIXELA INHUB) 250-50 MCG/ACT AEPB Inhale 1 puff into the lungs in the morning and at bedtime. 180 each 0   LINZESS 290 MCG CAPS capsule TAKE 1 CAPSULE (290 MCG TOTAL) BY MOUTH EVERY MORNING 90 capsule 1   traMADol (ULTRAM) 50 MG tablet Take 50 mg by mouth as needed for moderate pain (pain score 4-6) or severe pain (pain score 7-10).     triamterene  (DYRENIUM) 50 MG capsule TAKE 1 CAPSULE BY MOUTH EVERY DAY 90 capsule 1   Vitamin D, Ergocalciferol, (DRISDOL) 1.25 MG (50000 UNIT) CAPS capsule Take 1 capsule (50,000 Units total) by mouth every 7 (seven) days. 12 capsule 0   folic acid (FOLVITE) 1 MG tablet TAKE 1 TABLET BY MOUTH EVERY DAY 90 tablet 0   thiamine (VITAMIN B1) 100 MG tablet TAKE 1 TABLET BY MOUTH EVERY DAY 90 tablet 1   Zinc 50 MG TABS TAKE 1 TABLET BY MOUTH EVERY OTHER DAY. 45 tablet 1   amoxicillin-clavulanate (AUGMENTIN) 875-125 MG tablet Take 1 tablet by mouth 2 (two) times daily. 14 tablet 0   azithromycin (ZITHROMAX) 250 MG tablet 500 milligrams (mg) on Day 1 (the first day), taken as a single dose. Then, 250 mg on Day 2 through Day 5 6 tablet 0   No facility-administered medications prior to visit.    ROS Review of Systems  Constitutional: Negative.  Negative for appetite change, diaphoresis, fatigue and unexpected weight change.  HENT: Negative.    Eyes:  Negative for visual disturbance.  Respiratory:  Negative for cough, chest tightness, shortness of breath and wheezing.   Cardiovascular:  Negative for chest pain, palpitations and leg swelling.  Gastrointestinal:  Positive for nausea. Negative for abdominal pain, constipation, diarrhea and vomiting.  Endocrine: Negative.   Genitourinary: Negative.  Negative for difficulty urinating and dysuria.  Musculoskeletal: Negative.  Negative for arthralgias and myalgias.  Skin: Negative.   Neurological:  Positive for headaches. Negative for dizziness, weakness and light-headedness.  Hematological:  Negative for adenopathy. Does not bruise/bleed easily.  Psychiatric/Behavioral: Negative.      Objective:  BP (!) 154/78 (BP Location: Right Leg, Patient Position: Sitting, Cuff Size: Large) Comment: thigh cuff  Pulse 100   Temp 98.3 F (36.8 C) (Oral)   Resp 16   Ht 5\' 3"  (1.6 m)   Wt 251 lb 12.8 oz (114.2 kg)   SpO2 97%   BMI 44.60 kg/m   BP Readings from Last 3  Encounters:  11/19/22 (!) 154/78  09/17/22 (!) 152/90  09/05/22 (!) 156/80    Wt Readings from Last 3 Encounters:  11/19/22 251 lb 12.8 oz (114.2 kg)  09/17/22 252 lb (114.3 kg)  07/03/22 253 lb (114.8 kg)    Physical Exam Vitals reviewed.  Constitutional:      Appearance: Normal appearance.  HENT:     Mouth/Throat:     Mouth: Mucous membranes are moist.  Eyes:     General: No scleral icterus.    Pupils: Pupils are equal, round, and reactive to light.  Cardiovascular:     Rate and Rhythm: Normal rate and regular rhythm.     Pulses: Normal pulses.     Heart sounds: No murmur heard.    No friction rub. No gallop.  Pulmonary:     Effort: Pulmonary effort is normal.     Breath sounds: No stridor. No wheezing, rhonchi or rales.  Abdominal:     General: Abdomen is flat.     Palpations: There is no mass.     Tenderness: There is no abdominal tenderness. There is no guarding.     Hernia: No hernia is present.  Musculoskeletal:        General: Normal range of motion.     Cervical back: Neck supple.     Right lower leg: No edema.     Left lower leg: No edema.  Lymphadenopathy:     Cervical: No cervical adenopathy.  Skin:    General: Skin is warm and dry.  Neurological:     General: No focal deficit present.     Mental Status: She is alert and oriented to person, place, and time. Mental status is at baseline.  Psychiatric:        Mood and Affect: Mood normal.        Behavior: Behavior normal.     Lab Results  Component Value Date   WBC 8.5 11/19/2022   HGB 12.5 11/19/2022   HCT 39.4 11/19/2022   PLT 343.0 11/19/2022   GLUCOSE 82 11/19/2022   CHOL 181 11/19/2022   TRIG 81.0 11/19/2022   HDL 60.20 11/19/2022   LDLCALC 105 (H) 11/19/2022   ALT 15 11/19/2022   AST 16 11/19/2022   NA 144 11/19/2022   K 3.9 11/19/2022   CL 107 11/19/2022   CREATININE 0.98 11/19/2022   BUN 10 11/19/2022   CO2 28 11/19/2022   TSH 2.25 11/19/2022   HGBA1C 5.8 05/19/2022    No  results found.  Assessment & Plan:   Hyperlipidemia with target LDL less than 130- Will start a statin for CV risk reduction. -     TSH; Future -     Hepatic function panel; Future -     Rosuvastatin Calcium; Take 1 tablet (10 mg total) by mouth daily.  Dispense: 90 tablet; Refill: 0  Need for immunization against influenza -  Flu Vaccine Trivalent High Dose (Fluad)  Essential hypertension- She has not achieved her BP goal. Will start a BB. -     Basic metabolic panel; Future -     TSH; Future -     Urinalysis, Routine w reflex microscopic; Future -     Hepatic function panel; Future -     Nebivolol HCl; Take 1 tablet (5 mg total) by mouth daily.  Dispense: 90 tablet; Refill: 0  Vitamin B12 deficiency anemia due to intrinsic factor deficiency -     Folic Acid; Take 1 tablet (1 mg total) by mouth daily.  Dispense: 90 tablet; Refill: 0 -     Vitamin B12; Future -     CBC with Differential/Platelet; Future  Folate deficiency -     Folic Acid; Take 1 tablet (1 mg total) by mouth daily.  Dispense: 90 tablet; Refill: 0 -     CBC with Differential/Platelet; Future  Thiamine deficiency -     Thiamine HCl; Take 1 tablet (100 mg total) by mouth daily.  Dispense: 90 tablet; Refill: 1 -     CBC with Differential/Platelet; Future  LAP-BAND surgery status -     Thiamine HCl; Take 1 tablet (100 mg total) by mouth daily.  Dispense: 90 tablet; Refill: 1  Dietary zinc deficiency -     Zinc; Take 1 tablet (50 mg total) by mouth every other day.  Dispense: 45 tablet; Refill: 1 -     CBC with Differential/Platelet; Future  Bariatric surgery status -     Zinc; Take 1 tablet (50 mg total) by mouth every other day.  Dispense: 45 tablet; Refill: 1  Encounter for general adult medical examination with abnormal findings - Exam completed, labs reviewed, vaccines reviewed and updated, cancer screenings are UTD, pt ed material was given.  -     Lipid panel; Future  Need for prophylactic vaccination  and inoculation against varicella -     Shingrix; Inject 0.5 mLs into the muscle once for 1 dose.  Dispense: 0.5 mL; Refill: 1     Follow-up: Return in about 3 months (around 02/19/2023).  [name], MD

## 2022-11-21 MED ORDER — ROSUVASTATIN CALCIUM 10 MG PO TABS: 10.0000 mg | ORAL_TABLET | Freq: Every day | ORAL | 0 refills | Status: DC

## 2022-11-22 ENCOUNTER — Other Ambulatory Visit: Payer: Self-pay | Admitting: Internal Medicine

## 2022-11-22 DIAGNOSIS — I10 Essential (primary) hypertension: Secondary | ICD-10-CM

## 2022-11-28 ENCOUNTER — Other Ambulatory Visit: Payer: Self-pay | Admitting: Internal Medicine

## 2022-11-28 DIAGNOSIS — E559 Vitamin D deficiency, unspecified: Secondary | ICD-10-CM

## 2022-12-01 ENCOUNTER — Other Ambulatory Visit: Payer: Self-pay | Admitting: Internal Medicine

## 2022-12-01 DIAGNOSIS — J4520 Mild intermittent asthma, uncomplicated: Secondary | ICD-10-CM

## 2022-12-10 ENCOUNTER — Other Ambulatory Visit: Payer: Self-pay | Admitting: Internal Medicine

## 2022-12-10 DIAGNOSIS — K5904 Chronic idiopathic constipation: Secondary | ICD-10-CM

## 2022-12-24 ENCOUNTER — Other Ambulatory Visit: Payer: Self-pay | Admitting: Internal Medicine

## 2022-12-24 DIAGNOSIS — L2084 Intrinsic (allergic) eczema: Secondary | ICD-10-CM

## 2023-01-09 ENCOUNTER — Other Ambulatory Visit: Payer: Self-pay | Admitting: Urology

## 2023-01-16 NOTE — H&P (Signed)
 CC/HPI: Incontinence   [name] is a 68 year old female with urinary incontinence   09/20/21:  -Long hx of urinary urgency with sporadic episodes of incontinence specifically when she puts her key in the front door  -UDS in 2016 showed a low capacity bladder with involuntary detrusor contractions  -She is now experiencing nocturia with leakage when she rises from bed for the last 2-3 months--denies SUI during the day  -hx of OSA--wears a CPAP  -No hx of DM2  -Currently on triamterine-hctz for HTN and notes that her incontinence is worse when she takes these medications  -P4 (3 vaginal, 1 c-section)  -She has tried oxybutynin, vesicare and detrol in the past with no benefit  -No hx of recurrent UTIs or kidney stones  -No prior GU surgery or trauma  -She denies dysuria or hematuria  -Chronic constipation--well managed with Linzess   -Hx of major depression and course tremors involving her hands and legs--no other neurologic issues--currently treated with Cymbalta , Wellbutrin  and Abilify --no hx of MS   05/19/22: The patient is here today for a routine follow-up. Sleeping through the night but will instantly have urge incontinence when she stands up out of bed. No voiding sxs during the day. Currently off Myrbetriq due to it being ineffective. She notes improvement in her LUTS with PFPT, minus he incontinence in the AM.   08/21/22: The patient is here today for a routine follow-up. She continues to struggle with SUI, specifically when getting out of bed and with coughing. She continues to use vaginal Estrace and notes marginal improvement in her urinary symptoms. Denies interval UTIs, dysuria or hematuria.   10/29/2022: 68 year old woman with mixed urinary incontinence. She was previously seeing Dr. Devere and was referred for consideration of Bulkamid. She has severe urge incontinence with turn key syndrome refractory to multiple medications. She also has stress urinary incontinence and has tried  pelvic floor physical therapy before. She is also using vaginal estradiol cream. Urinary study in 2016 showed low capacity bladder with involuntary bladder contractions. She wears depends at night.    12/10/22: 68 year old woman with a history of mixed urinary incontinence here for urodynamics results and cystoscopy. Patient feels like her stress urinary continence is getting worse.   UDS SUMMARY  [name] held a max capacity of approx. 224 mls. Her 1st sensation was felt at 30 mls. Her bladder was filled twice during this study. No SUI noted. There was positive low amplitude instability. She felt an increased urgency but did not leak or void off this contraction. She was able to generate a voluntary contraction and void 224 mls with max flow of 12 ml/s. Max detrusor pressure while voiding was 38 cmH20. EMG leads were basically quiet during the voiding phase. PVR was approx. 0 mls. No trabeculation was noted. No reflux was seen. She will return for UDS follow up.     ALLERGIES: lisinopril    MEDICATIONS: Estrace 0.01 % cream with applicator Apply a pea-sized amnt around the urethra & vaginal opening once daily for 2 weeks, then transition to every other day.  Bupropion  Hcl Sr 150 mg tablet, extended release 12 hr  Cpap  Cymbalta  60 mg capsule,delayed release  Indapamide   Linzess  290 mcg capsule  Potassium  Vitamin B-1  Vitamin D2 1,250 mcg (50,000 unit) capsule  Zinc      GU PSH: Complex cystometrogram, w/ void pressure and urethral pressure profile studies, any technique - 11/11/2022 Complex Uroflow - 11/11/2022 Cystoscopy - 08/21/2022 Emg surf Electrd -  11/11/2022 Hysterectomy Unilat SO - 2016 Inject For cystogram - 11/11/2022 Intrabd voidng Press - 11/11/2022       PSH Notes: Throat Surgery, Breast Surgery, Hysterectomy, Gallbladder Surgery, Gastric Surgery, Appendectomy,   NON-GU PSH: Appendectomy - 2016 Breast Surgery Procedure - 2016 Cesarean Delivery Visit Complexity (formerly  GPC1X) - 10/29/2022, 08/21/2022, 05/19/2022     GU PMH: Mixed incontinence - 11/11/2022, - 10/29/2022, - 09/20/2021 Nocturia - 10/29/2022 Urinary Urgency (Stable) - 10/29/2022, Urinary urgency, - 2016 Stress Incontinence (Stable) - 08/21/2022, Female stress incontinence, - 2016 Urge incontinence - 05/19/2022, - 04/11/2022, - 03/26/2022, - 03/12/2022, - 02/26/2022, - 02/03/2022, - 12/04/2021, Urge incontinence of urine, - 2016 Urethral disorders, muscular, Muscular disorder of urethra - 2016 Overactive bladder, OAB (overactive bladder) - 2016      PMH Notes: -sleep apnea    NON-GU PMH: Constipation, unspecified - 04/11/2022, - 03/26/2022, - 03/12/2022, - 02/26/2022, - 02/03/2022 Muscle weakness (generalized) - 04/11/2022, - 03/26/2022, - 03/12/2022, - 02/26/2022, - 02/03/2022, - 12/04/2021, Muscle weakness, - 2016 Other muscle spasm - 04/11/2022, - 03/26/2022, - 03/12/2022, - 02/26/2022, - 02/03/2022 Other specified disorders of muscle - 04/11/2022, - 03/26/2022, - 03/12/2022, - 02/26/2022, - 02/03/2022 Encounter for general adult medical examination without abnormal findings, Encounter for preventive health examination - 2016 Personal history of other diseases of the circulatory system, History of hypertension - 2016 Personal history of other diseases of the nervous system and sense organs, History of sleep apnea - 2016 Personal history of other diseases of the respiratory system, History of asthma - 2016 Personal history of other mental and behavioral disorders, History of depression - 2016 Anxiety Depression GERD Hypertension Sleep Apnea    FAMILY HISTORY: cardiac disorder - Runs In Family Hypertension - Runs In Family malignant neoplasm of breast - Runs In Family multiple myeloma - Son   SOCIAL HISTORY: Marital Status: Married Preferred Language: English; Ethnicity: Not Hispanic Or Latino Current Smoking Status: Patient has never smoked.   Tobacco Use Assessment Completed: Used Tobacco in last 30 days? Has never  drank.  Drinks 1 caffeinated drink per day.     Notes: Exercise habits, Activities of daily living (ADL's), independent, Occupation, Alcohol use, Never a smoker, Four children, Married, Caffeine use   REVIEW OF SYSTEMS:    GU Review Female:   Patient denies frequent urination, hard to postpone urination, burning /pain with urination, get up at night to urinate, leakage of urine, stream starts and stops, trouble starting your stream, have to strain to urinate, and being pregnant.  Gastrointestinal (Upper):   Patient denies nausea, vomiting, and indigestion/ heartburn.  Gastrointestinal (Lower):   Patient denies diarrhea and constipation.  Constitutional:   Patient denies fever, night sweats, weight loss, and fatigue.  Skin:   Patient denies skin rash/ lesion and itching.  Eyes:   Patient denies blurred vision and double vision.  Ears/ Nose/ Throat:   Patient denies sore throat and sinus problems.  Hematologic/Lymphatic:   Patient denies swollen glands and easy bruising.  Cardiovascular:   Patient denies leg swelling and chest pains.  Respiratory:   Patient denies cough and shortness of breath.  Endocrine:   Patient denies excessive thirst.  Musculoskeletal:   Patient denies back pain and joint pain.  Neurological:   Patient denies headaches and dizziness.  Psychologic:   Patient denies anxiety and depression.   VITAL SIGNS: None   GU PHYSICAL EXAMINATION:    External Genitalia: No hirsutism, no rash, no scarring, no cyst, no erythematous lesion, no papular  lesion, no blanched lesion, no warty lesion. No edema.   MULTI-SYSTEM PHYSICAL EXAMINATION:    Constitutional: Well-nourished. No physical deformities. Normally developed. Good grooming.  Neck: Neck symmetrical, not swollen. Normal tracheal position.  Respiratory: No labored breathing, no use of accessory muscles.   Skin: No paleness, no jaundice, no cyanosis. No lesion, no ulcer, no rash.  Neurologic / Psychiatric: Oriented to time,  oriented to place, oriented to person. No depression, no anxiety, no agitation.  Eyes: Normal conjunctivae. Normal eyelids.  Ears, Nose, Mouth, and Throat: Left ear no scars, no lesions, no masses. Right ear no scars, no lesions, no masses. Nose no scars, no lesions, no masses. Normal hearing. Normal lips.  Musculoskeletal: Normal gait and station of head and neck.     Complexity of Data:  Records Review:   Previous Patient Records, POC Tool  Urine Test Review:   Urinalysis  Urodynamics Review:   Review Urodynamics Tests  Notes:                     11/19/2022: BUN 10, creatinine 0.98, GFR 59.9   PROCEDURES:         Flexible Cystoscopy - 52000  Risks, benefits, and some of the potential complications of the procedure were discussed at length with the patient including infection, bleeding, voiding discomfort, urinary retention, fever, chills, sepsis, and others. All questions were answered. Informed consent was obtained. Antibiotic prophylaxis was given. Sterile technique and intraurethral analgesia were used.  Meatus:  Patulous. Normal location. Normal condition.  Urethra:  No hypermobility. No leakage.  Ureteral Orifices:  Normal location. Normal size. Normal shape. Effluxed clear urine.  Bladder:  No trabeculation. No stones. White plaque on trigone consistent with squamous metaplasia. 3-4 areas of erythema and debris in bladder concerning for acute cystitis.      The lower urinary tract was carefully examined. The procedure was well-tolerated and without complications. Antibiotic instructions were given. Instructions were given to call the office immediately for bloody urine, difficulty urinating, urinary retention, painful or frequent urination, fever, chills, nausea, vomiting or other illness. The patient stated that she understood these instructions and would comply with them.         Urinalysis w/Scope Dipstick Dipstick Cont'd Micro  Color: Yellow Bilirubin: Neg mg/dL WBC/hpf: 6 -  89/yeq  Appearance: Slightly Cloudy Ketones: Neg mg/dL RBC/hpf: >39/yeq  Specific Gravity: 1.020 Blood: 3+ ery/uL Bacteria: Many (>50/hpf)  pH: 6.0 Protein: 2+ mg/dL Cystals: NS (Not Seen)  Glucose: Neg mg/dL Urobilinogen: 0.2 mg/dL Casts: NS (Not Seen)    Nitrites: Neg Trichomonas: Not Present    Leukocyte Esterase: Trace leu/uL Mucous: Present      Epithelial Cells: 0 - 5/hpf      Yeast: NS (Not Seen)      Sperm: Not Present    ASSESSMENT:      ICD-10 Details  1 GU:   Mixed incontinence - N39.46 Chronic, Stable  2   Nocturia - R35.1 Chronic, Stable  3 NON-GU:   Bacteriuria - R82.71 Chronic, Worsening   PLAN:            Medications New Meds: Macrobid 100 mg capsule 1 capsule PO BID start this antibiotic twice a day for three days and then start the daily antibiotic  #6  0 Refill(s)  Cephalexin 250 mg capsule 1 capsule PO Daily   #90  0 Refill(s)  Pharmacy Name:  [id number]  Address:  [address] [place], [place] [address]  Phone:  [phone]  Fax:  [phone]            Orders Labs CULTURE, URINE          Document Letter(s):  Created for Patient: Clinical Summary         Notes:   Mixed urinary incontinence:  -I reviewed patient's urodynamic study with her and we reviewed options for management. Risks and benefits of 100 units of Botox  in the bladder were discussed including but not limited to pain, bleeding, infection, urinary retention, damage on structures. Patient is interested in moving forward with Botox .  -For stress urinary incontinence component recommend that she restart vaginal estradiol to the urethral meatus and discussed risk benefits of Bulkamid. These include but are not limited to pain, bleeding, infection, need for repeat procedure, transient urinary retention.   Bacteriuria/cystitis:  -Cystoscopy with debris in bladder and areas of erythema concerning for cystitis. Will send urine for culture and treat empirically for acute cystitis  with suppressive antibiotics following this.

## 2023-01-18 ENCOUNTER — Other Ambulatory Visit: Payer: Self-pay | Admitting: Internal Medicine

## 2023-01-18 DIAGNOSIS — K2100 Gastro-esophageal reflux disease with esophagitis, without bleeding: Secondary | ICD-10-CM

## 2023-01-19 ENCOUNTER — Encounter (HOSPITAL_BASED_OUTPATIENT_CLINIC_OR_DEPARTMENT_OTHER): Payer: Self-pay | Admitting: Urology

## 2023-01-19 NOTE — Progress Notes (Signed)
 Spoke w/ via phone for pre-op interview--- [facility] needs dos----  ISTAT and EKG per anesthesia.       Lab results------ COVID test -----patient states asymptomatic no test needed Arrive at -------0745 NPO after MN NO Solid Food.   Med rec completed Medications to take morning of surgery -----Cymbalta , Bystolic , Wixela and bring Albuterol . Diabetic medication ----- Patient instructed no nail polish to be worn day of surgery Patient instructed to bring photo id and insurance card day of surgery Patient aware to have Driver (ride ) / caregiver    for 24 hours after surgery - Daughter [employer] Patient Special Instructions ----- Bring CPAP day of surgery. Pre-Op special Instructions ----- Patient verbalized understanding of instructions that were given at this phone interview. Patient denies chest pain, sob, fever, cough at the interview.

## 2023-01-22 ENCOUNTER — Encounter (HOSPITAL_BASED_OUTPATIENT_CLINIC_OR_DEPARTMENT_OTHER)
Admission: RE | Disposition: A | Discharge status: Home / Self Care | Payer: Self-pay | Source: Home / Self Care | Attending: Urology

## 2023-01-22 ENCOUNTER — Ambulatory Visit (HOSPITAL_BASED_OUTPATIENT_CLINIC_OR_DEPARTMENT_OTHER)
Admission: RE | Admit: 2023-01-22 | Discharge: 2023-01-22 | Disposition: A | Discharge status: Home / Self Care | Payer: No Typology Code available for payment source | Attending: Urology | Admitting: Urology

## 2023-01-22 ENCOUNTER — Ambulatory Visit (HOSPITAL_BASED_OUTPATIENT_CLINIC_OR_DEPARTMENT_OTHER): Payer: No Typology Code available for payment source | Admitting: Anesthesiology

## 2023-01-22 ENCOUNTER — Other Ambulatory Visit: Payer: Self-pay

## 2023-01-22 ENCOUNTER — Encounter (HOSPITAL_BASED_OUTPATIENT_CLINIC_OR_DEPARTMENT_OTHER): Payer: Self-pay | Admitting: Urology

## 2023-01-22 DIAGNOSIS — F32A Depression, unspecified: Secondary | ICD-10-CM | POA: Diagnosis not present

## 2023-01-22 DIAGNOSIS — I10 Essential (primary) hypertension: Secondary | ICD-10-CM

## 2023-01-22 DIAGNOSIS — Z8249 Family history of ischemic heart disease and other diseases of the circulatory system: Secondary | ICD-10-CM | POA: Diagnosis not present

## 2023-01-22 DIAGNOSIS — N3281 Overactive bladder: Secondary | ICD-10-CM | POA: Diagnosis not present

## 2023-01-22 DIAGNOSIS — G4733 Obstructive sleep apnea (adult) (pediatric): Secondary | ICD-10-CM | POA: Diagnosis not present

## 2023-01-22 DIAGNOSIS — N3946 Mixed incontinence: Secondary | ICD-10-CM | POA: Diagnosis not present

## 2023-01-22 DIAGNOSIS — M1990 Unspecified osteoarthritis, unspecified site: Secondary | ICD-10-CM | POA: Diagnosis not present

## 2023-01-22 DIAGNOSIS — J45909 Unspecified asthma, uncomplicated: Secondary | ICD-10-CM | POA: Insufficient documentation

## 2023-01-22 DIAGNOSIS — Z79899 Other long term (current) drug therapy: Secondary | ICD-10-CM | POA: Diagnosis not present

## 2023-01-22 DIAGNOSIS — R351 Nocturia: Secondary | ICD-10-CM | POA: Diagnosis not present

## 2023-01-22 DIAGNOSIS — Z6841 Body Mass Index (BMI) 40.0 and over, adult: Secondary | ICD-10-CM | POA: Diagnosis not present

## 2023-01-22 DIAGNOSIS — R8271 Bacteriuria: Secondary | ICD-10-CM | POA: Insufficient documentation

## 2023-01-22 DIAGNOSIS — E66813 Obesity, class 3: Secondary | ICD-10-CM | POA: Diagnosis not present

## 2023-01-22 DIAGNOSIS — N3289 Other specified disorders of bladder: Secondary | ICD-10-CM | POA: Diagnosis not present

## 2023-01-22 DIAGNOSIS — F419 Anxiety disorder, unspecified: Secondary | ICD-10-CM | POA: Insufficient documentation

## 2023-01-22 HISTORY — PX: BOTOX INJECTION: SHX5754

## 2023-01-22 HISTORY — PX: CYSTOSCOPY WITH BIOPSY: SHX5122

## 2023-01-22 HISTORY — DX: Prediabetes: R73.03

## 2023-01-22 LAB — POCT I-STAT, CHEM 8
BUN: 13 mg/dL (ref 8–23)
Calcium, Ion: 1.09 mmol/L — ABNORMAL LOW (ref 1.15–1.40)
Chloride: 110 mmol/L (ref 98–111)
Creatinine, Ser: 0.8 mg/dL (ref 0.44–1.00)
Glucose, Bld: 99 mg/dL (ref 70–99)
HCT: 40 % (ref 36.0–46.0)
Hemoglobin: 13.6 g/dL (ref 12.0–15.0)
Potassium: 4 mmol/L (ref 3.5–5.1)
Sodium: 143 mmol/L (ref 135–145)
TCO2: 24 mmol/L (ref 22–32)

## 2023-01-22 SURGERY — CYSTOSCOPY, WITH BIOPSY
Anesthesia: General | Site: Urethra

## 2023-01-22 MED ORDER — ONDANSETRON HCL 4 MG/2ML IJ SOLN
INTRAMUSCULAR | Status: AC
  Filled 2023-01-22: qty 2

## 2023-01-22 MED ORDER — LIDOCAINE HCL (PF) 2 % IJ SOLN
INTRAMUSCULAR | Status: AC
  Filled 2023-01-22: qty 5

## 2023-01-22 MED ORDER — SODIUM CHLORIDE (PF) 0.9 % IJ SOLN
INTRAMUSCULAR | Status: DC | PRN
  Administered 2023-01-22: 10 mL

## 2023-01-22 MED ORDER — ACETAMINOPHEN 500 MG PO TABS
ORAL_TABLET | ORAL | Status: AC
Start: 2023-01-22 — End: ?
  Filled 2023-01-22: qty 2

## 2023-01-22 MED ORDER — FENTANYL CITRATE (PF) 100 MCG/2ML IJ SOLN
INTRAMUSCULAR | Status: AC
  Filled 2023-01-22: qty 2

## 2023-01-22 MED ORDER — ONDANSETRON HCL 4 MG/2ML IJ SOLN
INTRAMUSCULAR | Status: DC | PRN
  Administered 2023-01-22: 4 mg via INTRAVENOUS

## 2023-01-22 MED ORDER — PROPOFOL 10 MG/ML IV BOLUS
INTRAVENOUS | Status: AC
  Filled 2023-01-22: qty 20

## 2023-01-22 MED ORDER — DEXAMETHASONE SODIUM PHOSPHATE 10 MG/ML IJ SOLN
INTRAMUSCULAR | Status: AC
Start: 2023-01-22 — End: ?
  Filled 2023-01-22: qty 1

## 2023-01-22 MED ORDER — CEFAZOLIN SODIUM-DEXTROSE 2-4 GM/100ML-% IV SOLN
INTRAVENOUS | Status: AC
  Filled 2023-01-22: qty 100

## 2023-01-22 MED ORDER — EPHEDRINE SULFATE-NACL 50-0.9 MG/10ML-% IV SOSY
PREFILLED_SYRINGE | INTRAVENOUS | Status: DC | PRN
  Administered 2023-01-22 (×2): 10 mg via INTRAVENOUS

## 2023-01-22 MED ORDER — ACETAMINOPHEN 500 MG PO TABS
1000.0000 mg | ORAL_TABLET | Freq: Once | ORAL | Status: AC
  Administered 2023-01-22: 1000 mg via ORAL

## 2023-01-22 MED ORDER — LACTATED RINGERS IV SOLN
INTRAVENOUS | Status: DC
  Administered 2023-01-22: 1000 mL via INTRAVENOUS

## 2023-01-22 MED ORDER — FENTANYL CITRATE (PF) 100 MCG/2ML IJ SOLN: 25.0000 ug | INTRAMUSCULAR | Status: DC | PRN

## 2023-01-22 MED ORDER — CEFAZOLIN SODIUM-DEXTROSE 2-4 GM/100ML-% IV SOLN
2.0000 g | INTRAVENOUS | Status: AC
  Administered 2023-01-22: 2 g via INTRAVENOUS

## 2023-01-22 MED ORDER — DEXAMETHASONE SODIUM PHOSPHATE 10 MG/ML IJ SOLN
INTRAMUSCULAR | Status: DC | PRN
  Administered 2023-01-22: 5 mg via INTRAVENOUS

## 2023-01-22 MED ORDER — ARTIFICIAL TEARS OPHTHALMIC OINT
TOPICAL_OINTMENT | OPHTHALMIC | Status: AC
  Filled 2023-01-22: qty 3.5

## 2023-01-22 MED ORDER — MIDAZOLAM HCL 2 MG/2ML IJ SOLN
INTRAMUSCULAR | Status: AC
Start: 2023-01-22 — End: ?
  Filled 2023-01-22: qty 2

## 2023-01-22 MED ORDER — EPHEDRINE 5 MG/ML INJ
INTRAVENOUS | Status: AC
  Filled 2023-01-22: qty 5

## 2023-01-22 MED ORDER — FENTANYL CITRATE (PF) 100 MCG/2ML IJ SOLN
INTRAMUSCULAR | Status: DC | PRN
  Administered 2023-01-22: 50 ug via INTRAVENOUS

## 2023-01-22 MED ORDER — LIDOCAINE 2% (20 MG/ML) 5 ML SYRINGE
INTRAMUSCULAR | Status: DC | PRN
  Administered 2023-01-22: 60 mg via INTRAVENOUS

## 2023-01-22 MED ORDER — PROPOFOL 10 MG/ML IV BOLUS
INTRAVENOUS | Status: DC | PRN
  Administered 2023-01-22: 200 mg via INTRAVENOUS

## 2023-01-22 MED ORDER — AMISULPRIDE (ANTIEMETIC) 5 MG/2ML IV SOLN: 10.0000 mg | Freq: Once | INTRAVENOUS | Status: DC | PRN

## 2023-01-22 MED ORDER — WATER FOR IRRIGATION, STERILE IR SOLN
Status: DC | PRN
  Administered 2023-01-22: 3000 mL

## 2023-01-22 SURGICAL SUPPLY — 20 items
BAG DRAIN URO-CYSTO SKYTR STRL (DRAIN) ×2 IMPLANT
CLOTH BEACON ORANGE TIMEOUT ST (SAFETY) ×2 IMPLANT
ELECT REM PT RETURN 9FT ADLT (ELECTROSURGICAL) ×2
ELECTRODE REM PT RTRN 9FT ADLT (ELECTROSURGICAL) ×2 IMPLANT
GLOVE BIO SURGEON STRL SZ 6.5 (GLOVE) ×2 IMPLANT
GOWN STRL REUS W/TWL LRG LVL3 (GOWN DISPOSABLE) ×2 IMPLANT
KIT TURNOVER CYSTO (KITS) ×2 IMPLANT
MANIFOLD NEPTUNE II (INSTRUMENTS) ×2 IMPLANT
NDL ASPIRATION 22 (NEEDLE) IMPLANT
NDL SAFETY ECLIPSE 18X1.5 (NEEDLE) ×2 IMPLANT
NEEDLE ASPIRATION 22 (NEEDLE)
PACK CYSTO (CUSTOM PROCEDURE TRAY) ×2 IMPLANT
SLEEVE SCD COMPRESS KNEE MED (STOCKING) ×2 IMPLANT
SPIKE FLUID TRANSFER (MISCELLANEOUS) IMPLANT
SYR 20ML LL LF (SYRINGE) ×2 IMPLANT
SYR CONTROL 10ML LL (SYRINGE) ×2 IMPLANT
SYSTEM URETHRAL BULK BULKAMID (Female Continence) IMPLANT
TUBE CONNECTING 12X1/4 (SUCTIONS) ×2 IMPLANT
TUBING UROLOGY SET (TUBING) ×2 IMPLANT
WATER STERILE IRR 3000ML UROMA (IV SOLUTION) ×2 IMPLANT

## 2023-01-22 NOTE — Op Note (Signed)
Operative Note   Preoperative diagnosis:  1.  Stress urinary incontinence 2.  Overactive bladder/urge incontinence 3.  Bladder lesion   Postoperative diagnosis: 1.  Stress urinary incontinence 2.  Overactive bladder/urge incontinence 3.  Bladder lesion no longer seen   Procedure(s): 1.  Cystoscopy with injection of bulkamid 2.  Intravesical botox injectinos 100units    Surgeon: Kasandra Knudsen, MD   Assistants:  None   Anesthesia:  General   Complications:  None   EBL:  minimal   Specimens: 1. none   Drains/Catheters: 1.  none   Intraoperative findings:   Normal urethra Bilateral orthotopics Uos Previously seen bladder lesion has resolved   Indication:  68 yo woman with symptomatic mixed urinary incontinence.   Description of procedure:   After risks and benefits of the procedure discussed with the patient, informed consent was obtained.  The patient was taken to the operating placed in the supine position.  Anesthesia was induced and antibiotics were administered.  The patient was then repositioned in the dorsolithotomy position.  She was prepped and draped in usual sterile fashion a timeout performed with the attending present.  The cystoscope was assembled with the Bulkamid system.  It was then placed in the urethral meatus and advanced into the bladder under direct visualization.  Prior cystoscopy had been done which noted normal anatomic landmarks.  These were again verified during cystoscopy today.  The injection cystoscope was placed in the urethral meatus and advanced into the bladder under direction visualization. 100units of botox were injected in standard templated over the posterior wall of the bladder taking care to avoid the Uos.  Next, the bulkamid scope was placed in bladder. The cystoscope was brought back to the bladder neck and the needle was advanced through the needle guide at the 1 o'clock position.  Once it was visualized and advanced it was  rotated to the 5 o'clock position.  Bulkamid was then injected until a bleb was seen.  This was then repeated at the 1 o'clock position in the 7 o'clock position until coaptation was noted.   This concluded the case.  The patient's bladder was left with approximately 200 cc of sterile saline.  The patient emerged from anesthesia and was transferred the PACU in stable condition.   Plan:  Plan for patient to void in PACU prior to discharge.

## 2023-01-22 NOTE — Interval H&P Note (Signed)
History and Physical Interval Note:  01/22/2023 8:32 AM  [name]  has presented today for surgery, with the diagnosis of OVERACTIVE BLADDER, STRESS URINARY INCONTINENCE, BLADDER LESION.  The various methods of treatment have been discussed with the patient and family. After consideration of risks, benefits and other options for treatment, the patient has consented to  Procedure(s) with comments: CYSTOSCOPY WITH BIOPSY AND FULGURATION (N/A) - 60 MINUTE CASE URETHRAL BULKING (N/A) BOTOX INJECTION 100 UNITS (N/A) as a surgical intervention.  The patient's history has been reviewed, patient examined, no change in status, stable for surgery.  I have reviewed the patient's chart and labs.  Questions were answered to the patient's satisfaction.     Daylen Hack D Zaryah Seckel

## 2023-01-22 NOTE — Anesthesia Postprocedure Evaluation (Signed)
Anesthesia Post Note  Patient: [name]  Procedure(s) Performed: CYSTOSCOPY (Bladder) URETHRAL BULKING (Urethra) BOTOX INJECTION 100 UNITS (Bladder)     Patient location during evaluation: PACU Anesthesia Type: General Level of consciousness: awake Pain management: pain level controlled Vital Signs Assessment: post-procedure vital signs reviewed and stable Respiratory status: spontaneous breathing, nonlabored ventilation and respiratory function stable Cardiovascular status: blood pressure returned to baseline and stable Postop Assessment: no apparent nausea or vomiting Anesthetic complications: no   No notable events documented.  Last Vitals:  Vitals:   01/22/23 1010 01/22/23 1032  BP: 131/77 131/72  Pulse: 66 (!) 58  Resp: (!) 21 18  Temp: 36.7 C   SpO2: 94% 95%    Last Pain:  Vitals:   01/22/23 0825  TempSrc:   PainSc: 0-No pain                 Justine Dines P Willean Schurman

## 2023-01-22 NOTE — Discharge Instructions (Addendum)
Cystoscopy with Bulkamid and Botox patient instructions  Following a cystoscopy, a catheter (a flexible rubber tube) is sometimes left in place to empty the bladder. This may cause some discomfort or a feeling that you need to urinate. Your doctor determines the period of time that the catheter will be left in place. You may have bloody urine for two to three days (Call your doctor if the amount of bleeding increases or does not subside).  You may pass blood clots in your urine, especially if you had a biopsy. It is not unusual to pass small blood clots and have some bloody urine a couple of weeks after your cystoscopy. Again, call your doctor if the bleeding does not subside. You may have: Dysuria (painful urination) Frequency (urinating often) Urgency (strong desire to urinate)  These symptoms are common especially if medicine is instilled into the bladder or a ureteral stent is placed. Avoiding alcohol and caffeine, such as coffee, tea, and chocolate, may help relieve these symptoms. Drink plenty of water, unless otherwise instructed. Your doctor may also prescribe an antibiotic or other medicine to reduce these symptoms.  Cystoscopy results are available soon after the procedure; biopsy results usually take two to four days. Your doctor will discuss the results of your exam with you. Before you go home, you will be given specific instructions for follow-up care. Special Instructions:   If you are going home with a catheter in place do not take a tub bath until removed by your doctor.   You may resume your normal activities.   Do not drive or operate machinery if you are taking narcotic pain medicine.   Be sure to keep all follow-up appointments with your doctor.   Call Your Doctor If: The catheter is not draining You have severe pain You are unable to urinate You have a fever over 101 You have severe bleeding          Post Anesthesia Home Care Instructions  Activity: Get plenty of  rest for the remainder of the day. A responsible adult should stay with you for 24 hours following the procedure.  For the next 24 hours, DO NOT: -Drive a car -Advertising copywriter -Drink alcoholic beverages -Take any medication unless instructed by your physician -Make any legal decisions or sign important papers.  Meals: Start with liquid foods such as gelatin or soup. Progress to regular foods as tolerated. Avoid greasy, spicy, heavy foods. If nausea and/or vomiting occur, drink only clear liquids until the nausea and/or vomiting subsides. Call your physician if vomiting continues.  Special Instructions/Symptoms: Your throat may feel dry or sore from the anesthesia or the breathing tube placed in your throat during surgery. If this causes discomfort, gargle with warm salt water. The discomfort should disappear within 24 hours.

## 2023-01-22 NOTE — Anesthesia Preprocedure Evaluation (Addendum)
Anesthesia Evaluation  Patient identified by MRN, date of birth, ID band Patient awake    Reviewed: Allergy & Precautions, NPO status , Patient's Chart, lab work & pertinent test results  Airway Mallampati: III  TM Distance: >3 FB Neck ROM: Full    Dental no notable dental hx.    Pulmonary asthma , sleep apnea and Continuous Positive Airway Pressure Ventilation  S/p tracheostomy in 2006   Pulmonary exam normal breath sounds clear to auscultation       Cardiovascular hypertension, Pt. on home beta blockers Normal cardiovascular exam Rhythm:Regular Rate:Normal     Neuro/Psych  PSYCHIATRIC DISORDERS Anxiety Depression    negative neurological ROS     GI/Hepatic Neg liver ROS,GERD  Medicated and Controlled,,  Endo/Other    Class 3 obesity  Renal/GU negative Renal ROS     Musculoskeletal  (+) Arthritis ,    Abdominal  (+) + obese  Peds  Hematology negative hematology ROS (+)   Anesthesia Other Findings OVERACTIVE BLADDER, STRESS URINARY INCONTINENCE, BLADDER LESION  Reproductive/Obstetrics                             Anesthesia Physical Anesthesia Plan  ASA: 3  Anesthesia Plan: General   Post-op Pain Management:    Induction: Intravenous  PONV Risk Score and Plan: 3 and Ondansetron, Dexamethasone, Midazolam and Treatment may vary due to age or medical condition  Airway Management Planned: LMA  Additional Equipment:   Intra-op Plan:   Post-operative Plan: Extubation in OR  Informed Consent: I have reviewed the patients History and Physical, chart, labs and discussed the procedure including the risks, benefits and alternatives for the proposed anesthesia with the patient or authorized representative who has indicated his/her understanding and acceptance.     Dental advisory given  Plan Discussed with: CRNA  Anesthesia Plan Comments:        Anesthesia Quick Evaluation

## 2023-01-22 NOTE — Discharge Instr - Supplementary Instructions (Signed)
May take Tylenol after 2:30pm if needed for discomfort.

## 2023-01-22 NOTE — Anesthesia Procedure Notes (Signed)
Procedure Name: LMA Insertion Date/Time: 01/22/2023 9:10 AM  Performed by: Bishop Limbo, CRNAPre-anesthesia Checklist: Patient identified, Emergency Drugs available, Suction available and Patient being monitored Patient Re-evaluated:Patient Re-evaluated prior to induction Oxygen Delivery Method: Circle System Utilized Preoxygenation: Pre-oxygenation with 100% oxygen Induction Type: IV induction Ventilation: Mask ventilation without difficulty LMA: LMA inserted LMA Size: 4.0 Number of attempts: 1 Placement Confirmation: positive ETCO2 Tube secured with: Tape Dental Injury: Teeth and Oropharynx as per pre-operative assessment

## 2023-01-22 NOTE — Transfer of Care (Signed)
Immediate Anesthesia Transfer of Care Note  Patient: [name]  Procedure(s) Performed: CYSTOSCOPY WITH BIOPSY AND FULGURATION (Bladder) URETHRAL BULKING (Urethra) BOTOX INJECTION 100 UNITS (Bladder)  Patient Location: PACU  Anesthesia Type:General  Level of Consciousness: awake, alert , oriented, and patient cooperative  Airway & Oxygen Therapy: Patient Spontanous Breathing and Patient connected to nasal cannula oxygen  Post-op Assessment: Report given to RN and Post -op Vital signs reviewed and stable  Post vital signs: Reviewed and stable  Last Vitals:  Vitals Value Taken Time  BP 122/61 01/22/23 0936  Temp 36.8 C 01/22/23 0936  Pulse 77 01/22/23 0937  Resp 13 01/22/23 0937  SpO2 98 % 01/22/23 0937  Vitals shown include unfiled device data.  Last Pain:  Vitals:   01/22/23 0825  TempSrc:   PainSc: 0-No pain      Patients Stated Pain Goal: 7 (01/22/23 0825)  Complications: No notable events documented.

## 2023-01-23 ENCOUNTER — Encounter (HOSPITAL_BASED_OUTPATIENT_CLINIC_OR_DEPARTMENT_OTHER): Payer: Self-pay | Admitting: Urology

## 2023-02-10 ENCOUNTER — Ambulatory Visit
Admission: RE | Admit: 2023-02-10 | Discharge: 2023-02-10 | Disposition: A | Discharge status: Home / Self Care | Payer: No Typology Code available for payment source | Source: Ambulatory Visit | Attending: Internal Medicine | Admitting: Internal Medicine

## 2023-02-10 ENCOUNTER — Encounter: Payer: Self-pay | Admitting: Internal Medicine

## 2023-02-10 DIAGNOSIS — E2839 Other primary ovarian failure: Secondary | ICD-10-CM

## 2023-02-13 ENCOUNTER — Other Ambulatory Visit: Payer: Self-pay | Admitting: Internal Medicine

## 2023-02-13 DIAGNOSIS — F418 Other specified anxiety disorders: Secondary | ICD-10-CM

## 2023-02-14 ENCOUNTER — Other Ambulatory Visit: Payer: Self-pay | Admitting: Internal Medicine

## 2023-02-14 DIAGNOSIS — I10 Essential (primary) hypertension: Secondary | ICD-10-CM

## 2023-02-18 ENCOUNTER — Other Ambulatory Visit: Payer: Self-pay | Admitting: Internal Medicine

## 2023-02-18 DIAGNOSIS — E559 Vitamin D deficiency, unspecified: Secondary | ICD-10-CM

## 2023-02-19 ENCOUNTER — Ambulatory Visit: Payer: No Typology Code available for payment source | Admitting: Internal Medicine

## 2023-02-19 ENCOUNTER — Encounter: Payer: Self-pay | Admitting: Internal Medicine

## 2023-02-19 VITALS — BP 134/82 | HR 88 | Temp 97.7°F | Ht 63.0 in | Wt 252.0 lb

## 2023-02-19 DIAGNOSIS — E611 Iron deficiency: Secondary | ICD-10-CM | POA: Diagnosis not present

## 2023-02-19 DIAGNOSIS — E66813 Obesity, class 3: Secondary | ICD-10-CM

## 2023-02-19 DIAGNOSIS — I10 Essential (primary) hypertension: Secondary | ICD-10-CM | POA: Diagnosis not present

## 2023-02-19 DIAGNOSIS — D510 Vitamin B12 deficiency anemia due to intrinsic factor deficiency: Secondary | ICD-10-CM | POA: Diagnosis not present

## 2023-02-19 DIAGNOSIS — E559 Vitamin D deficiency, unspecified: Secondary | ICD-10-CM

## 2023-02-19 DIAGNOSIS — E8881 Metabolic syndrome: Secondary | ICD-10-CM | POA: Diagnosis not present

## 2023-02-19 DIAGNOSIS — J4520 Mild intermittent asthma, uncomplicated: Secondary | ICD-10-CM

## 2023-02-19 DIAGNOSIS — Z23 Encounter for immunization: Secondary | ICD-10-CM

## 2023-02-19 DIAGNOSIS — G4733 Obstructive sleep apnea (adult) (pediatric): Secondary | ICD-10-CM

## 2023-02-19 LAB — CBC WITH DIFFERENTIAL/PLATELET
Basophils Absolute: 0.1 10*3/uL (ref 0.0–0.1)
Basophils Relative: 0.9 % (ref 0.0–3.0)
Eosinophils Absolute: 0.2 10*3/uL (ref 0.0–0.7)
Eosinophils Relative: 2.2 % (ref 0.0–5.0)
HCT: 39.6 % (ref 36.0–46.0)
Hemoglobin: 12.5 g/dL (ref 12.0–15.0)
Lymphocytes Relative: 17.8 % (ref 12.0–46.0)
Lymphs Abs: 1.6 10*3/uL (ref 0.7–4.0)
MCHC: 31.6 g/dL (ref 30.0–36.0)
MCV: 87.1 fL (ref 78.0–100.0)
Monocytes Absolute: 0.6 10*3/uL (ref 0.1–1.0)
Monocytes Relative: 6.6 % (ref 3.0–12.0)
Neutro Abs: 6.5 10*3/uL (ref 1.4–7.7)
Neutrophils Relative %: 72.5 % (ref 43.0–77.0)
Platelets: 322 10*3/uL (ref 150.0–400.0)
RBC: 4.55 Mil/uL (ref 3.87–5.11)
RDW: 16 % — ABNORMAL HIGH (ref 11.5–15.5)
WBC: 9 10*3/uL (ref 4.0–10.5)

## 2023-02-19 LAB — IBC + FERRITIN
Ferritin: 8 ng/mL — ABNORMAL LOW (ref 10.0–291.0)
Iron: 53 ug/dL (ref 42–145)
Saturation Ratios: 12.4 % — ABNORMAL LOW (ref 20.0–50.0)
TIBC: 428.4 ug/dL (ref 250.0–450.0)
Transferrin: 306 mg/dL (ref 212.0–360.0)

## 2023-02-19 LAB — VITAMIN D 25 HYDROXY (VIT D DEFICIENCY, FRACTURES): VITD: 38.39 ng/mL (ref 30.00–100.00)

## 2023-02-19 LAB — HEMOGLOBIN A1C: Hgb A1c MFr Bld: 6.1 % (ref 4.6–6.5)

## 2023-02-19 MED ORDER — IRON POLYSACCH CMPLX-B12-FA 150-0.025-1 MG PO CAPS: 1.0000 | ORAL_CAPSULE | Freq: Every day | ORAL | 1 refills | Status: DC

## 2023-02-19 MED ORDER — ZEPBOUND 2.5 MG/0.5ML ~~LOC~~ SOAJ: 2.5000 mg | SUBCUTANEOUS | 0 refills | Status: DC

## 2023-02-19 MED ORDER — SHINGRIX 50 MCG/0.5ML IM SUSR: 0.5000 mL | Freq: Once | INTRAMUSCULAR | 1 refills | Status: DC

## 2023-02-19 MED ORDER — VITAMIN D (ERGOCALCIFEROL) 1.25 MG (50000 UNIT) PO CAPS: 50000.0000 [IU] | ORAL_CAPSULE | ORAL | 0 refills | Status: DC

## 2023-02-19 MED ORDER — SHINGRIX 50 MCG/0.5ML IM SUSR: 0.5000 mL | Freq: Once | INTRAMUSCULAR | 1 refills | Status: AC

## 2023-02-19 MED ORDER — BUDESONIDE-FORMOTEROL FUMARATE 80-4.5 MCG/ACT IN AERO: 2.0000 | INHALATION_SPRAY | Freq: Two times a day (BID) | RESPIRATORY_TRACT | 1 refills | Status: DC

## 2023-02-19 NOTE — Patient Instructions (Signed)
Asthma, Adult  Asthma is a long-term (chronic) condition that causes recurrent episodes in which the lower airways in the lungs become tight and narrow. The narrowing is caused by inflammation and tightening of the smooth muscle around the lower airways. Asthma episodes, also called asthma attacks or asthma flares, may cause coughing, making high-pitched whistling sounds when you breathe, most often when you breathe out (wheezing), shortness of breath, and chest pain. The airways may produce extra mucus caused by the inflammation and irritation. During an attack, it can be difficult to breathe. Asthma attacks can range from minor to life-threatening. Asthma cannot be cured, but medicines and lifestyle changes can help control it and treat acute attacks. It is important to keep your asthma well controlled so the condition does not interfere with your daily life. What are the causes? This condition is believed to be caused by inherited (genetic) and environmental factors, but its exact cause is not known. What can trigger an asthma attack? Many things can bring on an asthma attack or make symptoms worse. These triggers are different for every person. Common triggers include: Allergens and irritants like mold, dust, pet dander, cockroaches, pollen, air pollution, and chemical odors. Cigarette smoke. Weather changes and cold air. Stress and strong emotional responses such as crying or laughing hard. Certain medications such as aspirin or beta blockers. Infections and inflammatory conditions, such as the flu, a cold, pneumonia, or inflammation of the nasal membranes (rhinitis). Gastroesophageal reflux disease (GERD). What are the signs or symptoms? Symptoms may occur right after exposure to an asthma trigger or hours later and can vary by person. Common signs and symptoms include: Wheezing. Trouble breathing (shortness of breath). Excessive nighttime or early morning coughing. Chest  tightness. Tiredness (fatigue) with minimal activity. Difficulty talking in complete sentences. Poor exercise tolerance. How is this diagnosed? This condition is diagnosed based on: A physical exam and your medical history. Tests, which may include: Lung function studies to evaluate the flow of air in your lungs. Allergy tests. Imaging tests, such as X-rays. How is this treated? There is no cure, but symptoms can be controlled with proper treatment. Treatment usually involves: Identifying and avoiding your asthma triggers. Inhaled medicines. Two types are commonly used to treat asthma, depending on severity: Controller medicines. These help prevent asthma symptoms from occurring. They are taken every day. Fast-acting reliever or rescue medicines. These quickly relieve asthma symptoms. They are used as needed and provide short-term relief. Using other medicines, such as: Allergy medicines, such as antihistamines, if your asthma attacks are triggered by allergens. Immune medicines (immunomodulators). These are medicines that help control the immune system. Using supplemental oxygen. This is only needed during a severe episode. Creating an asthma action plan. An asthma action plan is a written plan for managing and treating your asthma attacks. This plan includes: A list of your asthma triggers and how to avoid them. Information about when medicines should be taken and when their dosage should be changed. Instructions about using a device called a peak flow meter. A peak flow meter measures how well the lungs are working and the severity of your asthma. It helps you monitor your condition. Follow these instructions at home: Take over-the-counter and prescription medicines only as told by your health care provider. Stay up to date on all vaccinations as recommended by your healthcare provider, including vaccines for the flu and pneumonia. Use a peak flow meter and keep track of your peak flow  readings. Understand and use your asthma  action plan to address any asthma flares. Do not smoke or allow anyone to smoke in your home. Contact a health care provider if: You have wheezing, shortness of breath, or a cough that is not responding to medicines. Your medicines are causing side effects, such as a rash, itching, swelling, or trouble breathing. You need to use a reliever medicine more than 2-3 times a week. Your peak flow reading is still at 50-79% of your personal best after following your action plan for 1 hour. You have a fever and shortness of breath. Get help right away if: You are getting worse and do not respond to treatment during an asthma attack. You are short of breath when at rest or when doing very little physical activity. You have difficulty eating, drinking, or talking. You have chest pain or tightness. You develop a fast heartbeat or palpitations. You have a bluish color to your lips or fingernails. You are light-headed or dizzy, or you faint. Your peak flow reading is less than 50% of your personal best. You feel too tired to breathe normally. These symptoms may be an emergency. Get help right away. Call 911. Do not wait to see if the symptoms will go away. Do not drive yourself to the hospital. Summary Asthma is a long-term (chronic) condition that causes recurrent episodes in which the airways become tight and narrow. Asthma episodes, also called asthma attacks or asthma flares, can cause coughing, wheezing, shortness of breath, and chest pain. Asthma cannot be cured, but medicines and lifestyle changes can help keep it well controlled and prevent asthma flares. Make sure you understand how to avoid triggers and how and when to use your medicines. Asthma attacks can range from minor to life-threatening. Get help right away if you have an asthma attack and do not respond to treatment with your usual rescue medicines. This information is not intended to replace  advice given to you by your health care provider. Make sure you discuss any questions you have with your health care provider. Document Revised: 10/10/2020 Document Reviewed: 10/01/2020 Elsevier Patient Education  2024 [employer].

## 2023-02-19 NOTE — Progress Notes (Signed)
 Subjective:  Patient ID: [name], female    DOB: October 27, 1955  Age: 68 y.o. MRN: [id number]  CC: Asthma and Gastroesophageal Reflux   HPI [name] presents for f/up ----  Discussed the use of AI scribe software for clinical note transcription with the patient, who gave verbal consent to proceed.  History of Present Illness   [name] is a 68 year old female with asthma and sleep apnea who presents with a persistent cough and concerns about iron levels.  She has experienced a persistent cough for almost a year. She previously used [place] but discontinued it a month ago due to an unusual sensation on her tongue, despite following mouth rinsing instructions. She currently uses a rescue inhaler taking two puffs in the morning, which provides relief throughout the day. However, she occasionally needs to pause conversations to cough. No shortness of breath, chest pain, or swelling in her legs or feet.  She is concerned about her iron levels following a procedure last month that indicated an issue. She denies symptoms of iron deficiency such as shortness of breath or blood loss but mentions a habit of eating ice. She is not taking an iron supplement as her blood levels have been stable in the past.  She has a history of sleep apnea and is interested in medication for weight management related to this condition. She is not very active, only taking her dogs out and back in.  She received a flu shot and a shingles vaccine in 2023. She is unsure about her vitamin D levels as her prescription ran out, and the pharmacy is contacting her provider.       Outpatient Medications Prior to Visit  Medication Sig Dispense Refill   albuterol (VENTOLIN HFA) 108 (90 Base) MCG/ACT inhaler Inhale 1-2 puffs into the lungs every 6 (six) hours as needed for wheezing or shortness of breath. 18 g 0   DULoxetine (CYMBALTA) 60 MG capsule TAKE 1 CAPSULE BY MOUTH EVERY DAY 90 capsule 0   esomeprazole (NEXIUM)  40 MG capsule TAKE 1 CAPSULE (40 MG TOTAL) BY MOUTH DAILY AT 12 NOON. 90 capsule 0   estradiol (ESTRACE) 0.1 MG/GM vaginal cream PLEASE SEE ATTACHED FOR DETAILED DIRECTIONS     fluocinonide-emollient (LIDEX-E) 0.05 % cream APPLY TO AFFECTED AREA TWICE A DAY 60 g 1   folic acid (FOLVITE) 1 MG tablet Take 1 tablet (1 mg total) by mouth daily. 90 tablet 0   LINZESS 290 MCG CAPS capsule TAKE 1 CAPSULE (290 MCG TOTAL) BY MOUTH EVERY MORNING 30 capsule 5   nebivolol (BYSTOLIC) 5 MG tablet TAKE 1 TABLET (5 MG TOTAL) BY MOUTH DAILY. 90 tablet 0   OVER THE COUNTER MEDICATION Take 500 mg by mouth daily as needed. Bc powder     thiamine (VITAMIN B1) 100 MG tablet Take 1 tablet (100 mg total) by mouth daily. 90 tablet 1   triamterene (DYRENIUM) 50 MG capsule TAKE 1 CAPSULE BY MOUTH EVERY DAY 90 capsule 1   Zinc 50 MG TABS Take 1 tablet (50 mg total) by mouth every other day. 45 tablet 1   Vitamin D, Ergocalciferol, (DRISDOL) 1.25 MG (50000 UNIT) CAPS capsule TAKE 1 CAPSULE (50,000 UNITS TOTAL) BY MOUTH EVERY 7 (SEVEN) DAYS 12 capsule 0   WIXELA INHUB 250-50 MCG/ACT AEPB INHALE 1 PUFF INTO THE LUNGS IN THE MORNING AND AT BEDTIME. 180 each 0   No facility-administered medications prior to visit.    ROS Review of Systems  Constitutional:  Negative for appetite change, chills, diaphoresis and fatigue.  HENT: Negative.    Respiratory:  Positive for apnea, cough and wheezing. Negative for chest tightness and shortness of breath.   Cardiovascular:  Negative for chest pain, palpitations and leg swelling.  Gastrointestinal: Negative.  Negative for abdominal pain, constipation, diarrhea, nausea and vomiting.  Genitourinary: Negative.  Negative for difficulty urinating and dysuria.  Musculoskeletal: Negative.   Skin: Negative.   Neurological:  Negative for dizziness, weakness and light-headedness.  Hematological:  Negative for adenopathy. Does not bruise/bleed easily.  Psychiatric/Behavioral: Negative.       Objective:  BP 134/82   Pulse 88   Temp 97.7 F (36.5 C) (Temporal)   Ht 5\' 3"  (1.6 m)   Wt 252 lb (114.3 kg)   SpO2 93%   BMI 44.64 kg/m   BP Readings from Last 3 Encounters:  02/19/23 134/82  01/22/23 131/72  11/19/22 (!) 154/78    Wt Readings from Last 3 Encounters:  02/19/23 252 lb (114.3 kg)  01/22/23 253 lb 12.8 oz (115.1 kg)  11/19/22 251 lb 12.8 oz (114.2 kg)    Physical Exam Vitals reviewed.  HENT:     Mouth/Throat:     Mouth: Mucous membranes are moist.  Eyes:     General: No scleral icterus.    Conjunctiva/sclera: Conjunctivae normal.  Cardiovascular:     Rate and Rhythm: Normal rate and regular rhythm.     Heart sounds: No murmur heard.    No friction rub. No gallop.  Pulmonary:     Effort: Pulmonary effort is normal.     Breath sounds: Examination of the right-middle field reveals wheezing. Wheezing present. No decreased breath sounds, rhonchi or rales.  Abdominal:     General: Abdomen is flat.     Palpations: There is no mass.     Tenderness: There is no abdominal tenderness. There is no guarding.     Hernia: No hernia is present.  Musculoskeletal:        General: Normal range of motion.     Cervical back: Neck supple.     Right lower leg: No edema.     Left lower leg: No edema.  Skin:    General: Skin is warm and dry.     Findings: No rash.  Neurological:     General: No focal deficit present.  Psychiatric:        Mood and Affect: Mood normal.        Behavior: Behavior normal.     Lab Results  Component Value Date   WBC 9.0 02/19/2023   HGB 12.5 02/19/2023   HCT 39.6 02/19/2023   PLT 322.0 02/19/2023   GLUCOSE 99 01/22/2023   CHOL 181 11/19/2022   TRIG 81.0 11/19/2022   HDL 60.20 11/19/2022   LDLCALC 105 (H) 11/19/2022   ALT 15 11/19/2022   AST 16 11/19/2022   NA 143 01/22/2023   K 4.0 01/22/2023   CL 110 01/22/2023   CREATININE 0.80 01/22/2023   BUN 13 01/22/2023   CO2 28 11/19/2022   TSH 2.25 11/19/2022   HGBA1C 6.1  02/19/2023    DG BONE DENSITY (DXA) Result Date: 02/10/2023 EXAM: DUAL X-RAY ABSORPTIOMETRY (DXA) FOR BONE MINERAL DENSITY IMPRESSION: Referring Physician:  Etta Grandchild Your patient completed a bone mineral density test using GE Lunar iDXA system (analysis version: 16). Technologist: BEC PATIENT: Name: Jasmeen, Fritsch Patient ID: [id number] Birth Date: 1955/08/20 Height: 62.0 in. Sex: Female Measured: 02/10/2023 Weight: 254.6 lbs. Indications: Albuterol,  Bariatric surgery, Bilateral Oophorectomy (65.51), Cymbalta, Estrogen Deficient, Hysterectomy, Nexium, Postmenopausal, Secondary Osteoporosis, Vitamin D Deficient Fractures: NONE Treatments: Estradiol Vaginal Cream, Vitamin D (E933.5) ASSESSMENT: The BMD measured at Femur Neck Right is 0.916 g/cm2 with a T-score of -0.9. This patient's diagnostic category is NORMAL according to World Health Organization [facility]) criteria. L1 & L4 were excluded due to degenerative changes. The quality of the exam is limited by patient body habitus. Site Region Measured Date Measured Age YA BMD Significant CHANGE T-score DualFemur Neck Right 2/10/2023    67.4         -0.9    0.916 g/cm2 AP Spine  L2-L3      2/10/2023    67.4         0.4     1.249 g/cm2 DualFemur Total Mean 2/10[age]2023    67.4         0.6     1.081 g/cm2 World Health Organization [facility]) criteria for post-menopausal, Caucasian Women: Normal       T-score at or above -1 SD Osteopenia   T-score between -1 and -2.5 SD Osteoporosis T-score at or below -2.5 SD RECOMMENDATION: 1. All patients should optimize calcium and vitamin D intake. 2. Consider FDA-approved medical therapies in postmenopausal women and men aged 8 years and older, based on the following: a. A hip or vertebral (clinical or morphometric) fracture. b. T-score = -2.5 at the femoral neck or spine after appropriate evaluation to exclude secondary causes. c. Low bone mass (T-score between -1.0 and -2.5 at the femoral neck or spine) and a 10-year probability of  a hip fracture = 3% or a 10-year probability of a major osteoporosis-related fracture = 20% based on the [place]-adapted WHO algorithm. d. Clinician judgment and/or patient preferences may indicate treatment for people with 10-year fracture probabilities above or below these levels. FOLLOW-UP: Patients with diagnosis of osteoporosis or at high risk for fracture should have regular bone mineral density tests.? Patients eligible for Medicare are allowed routine testing every 2 years.? The testing frequency can be increased to one year for patients who have rapidly progressing disease, are receiving or discontinuing medical therapy to restore bone mass, or have additional risk factors. I have reviewed this study and agree with the findings. [facility] Electronically Signed   By: Harmon Pier M.D.   On: 02/10/2023 08:46    Assessment & Plan:  Need for prophylactic vaccination and inoculation against varicella -     Shingrix; Inject 0.5 mLs into the muscle once for 1 dose.  Dispense: 0.5 mL; Refill: 1  Iron deficiency -     IBC + Ferritin; Future -     CBC with Differential/Platelet; Future -     Iron Polysacch Cmplx-B12-FA; Take 1 capsule by mouth daily.  Dispense: 90 capsule; Refill: 1  Essential hypertension- Her BP is well controlled.  Vitamin D deficiency -     Vitamin D (Ergocalciferol); Take 1 capsule (50,000 Units total) by mouth every 7 (seven) days.  Dispense: 12 capsule; Refill: 0  Vitamin B12 deficiency anemia due to intrinsic factor deficiency -     VITAMIN D 25 Hydroxy (Vit-D Deficiency, Fractures); Future  Insulin resistance syndrome -     Hemoglobin A1c; Future  Obesity, Class III, BMI 40-49.9 (morbid obesity) (HCC) -     Hemoglobin A1c; Future -     Zepbound; Inject 2.5 mg into the skin once a week.  Dispense: 4 mL; Refill: 0  OSA (obstructive sleep apnea) -  Zepbound; Inject 2.5 mg into the skin once a week.  Dispense: 4 mL; Refill: 0  Mild intermittent asthma  without complication -     Budesonide-Formoterol Fumarate; Inhale 2 puffs into the lungs 2 (two) times daily.  Dispense: 3 each; Refill: 1     Follow-up: Return in about 4 months (around 06/19/2023).  [name], MD

## 2023-02-25 ENCOUNTER — Encounter: Payer: Self-pay | Admitting: Internal Medicine

## 2023-04-23 ENCOUNTER — Other Ambulatory Visit: Payer: Self-pay | Admitting: Internal Medicine

## 2023-04-23 DIAGNOSIS — K2100 Gastro-esophageal reflux disease with esophagitis, without bleeding: Secondary | ICD-10-CM

## 2023-05-16 ENCOUNTER — Other Ambulatory Visit: Payer: Self-pay | Admitting: Internal Medicine

## 2023-05-16 DIAGNOSIS — F418 Other specified anxiety disorders: Secondary | ICD-10-CM

## 2023-05-17 ENCOUNTER — Other Ambulatory Visit: Payer: Self-pay | Admitting: Internal Medicine

## 2023-05-17 DIAGNOSIS — E559 Vitamin D deficiency, unspecified: Secondary | ICD-10-CM

## 2023-05-19 ENCOUNTER — Other Ambulatory Visit: Payer: Self-pay | Admitting: Internal Medicine

## 2023-05-19 DIAGNOSIS — I10 Essential (primary) hypertension: Secondary | ICD-10-CM

## 2023-05-25 ENCOUNTER — Ambulatory Visit (INDEPENDENT_AMBULATORY_CARE_PROVIDER_SITE_OTHER): Admitting: Family Medicine

## 2023-05-25 ENCOUNTER — Encounter: Payer: Self-pay | Admitting: Family Medicine

## 2023-05-25 ENCOUNTER — Ambulatory Visit: Payer: Self-pay

## 2023-05-25 ENCOUNTER — Other Ambulatory Visit: Payer: Self-pay | Admitting: Family Medicine

## 2023-05-25 VITALS — BP 150/88 | HR 72 | Temp 98.4°F | Resp 20 | Ht 63.0 in | Wt 261.0 lb

## 2023-05-25 DIAGNOSIS — J4520 Mild intermittent asthma, uncomplicated: Secondary | ICD-10-CM

## 2023-05-25 DIAGNOSIS — M25474 Effusion, right foot: Secondary | ICD-10-CM

## 2023-05-25 DIAGNOSIS — M25471 Effusion, right ankle: Secondary | ICD-10-CM

## 2023-05-25 DIAGNOSIS — J302 Other seasonal allergic rhinitis: Secondary | ICD-10-CM

## 2023-05-25 DIAGNOSIS — M25472 Effusion, left ankle: Secondary | ICD-10-CM | POA: Diagnosis not present

## 2023-05-25 DIAGNOSIS — M25475 Effusion, left foot: Secondary | ICD-10-CM | POA: Diagnosis not present

## 2023-05-25 DIAGNOSIS — R918 Other nonspecific abnormal finding of lung field: Secondary | ICD-10-CM

## 2023-05-25 DIAGNOSIS — G4733 Obstructive sleep apnea (adult) (pediatric): Secondary | ICD-10-CM

## 2023-05-25 DIAGNOSIS — I10 Essential (primary) hypertension: Secondary | ICD-10-CM

## 2023-05-25 DIAGNOSIS — E66813 Obesity, class 3: Secondary | ICD-10-CM

## 2023-05-25 LAB — COMPREHENSIVE METABOLIC PANEL WITH GFR
ALT: 17 U/L (ref 0–35)
AST: 18 U/L (ref 0–37)
Albumin: 4 g/dL (ref 3.5–5.2)
Alkaline Phosphatase: 113 U/L (ref 39–117)
BUN: 17 mg/dL (ref 6–23)
CO2: 24 meq/L (ref 19–32)
Calcium: 9.1 mg/dL (ref 8.4–10.5)
Chloride: 106 meq/L (ref 96–112)
Creatinine, Ser: 0.88 mg/dL (ref 0.40–1.20)
GFR: 67.87 mL/min (ref >60.00)
Glucose, Bld: 92 mg/dL (ref 70–99)
Potassium: 3.9 meq/L (ref 3.5–5.1)
Sodium: 138 meq/L (ref 135–145)
Total Bilirubin: 0.2 mg/dL (ref 0.2–1.2)
Total Protein: 7.3 g/dL (ref 6.0–8.3)

## 2023-05-25 LAB — CBC WITH DIFFERENTIAL/PLATELET
Basophils Absolute: 0.1 10*3/uL (ref 0.0–0.1)
Basophils Relative: 0.7 % (ref 0.0–3.0)
Eosinophils Absolute: 0.3 10*3/uL (ref 0.0–0.7)
Eosinophils Relative: 2.7 % (ref 0.0–5.0)
HCT: 36.6 % (ref 36.0–46.0)
Hemoglobin: 11.8 g/dL — ABNORMAL LOW (ref 12.0–15.0)
Lymphocytes Relative: 27.2 % (ref 12.0–46.0)
Lymphs Abs: 2.6 10*3/uL (ref 0.7–4.0)
MCHC: 32.2 g/dL (ref 30.0–36.0)
MCV: 85.5 fl (ref 78.0–100.0)
Monocytes Absolute: 0.6 10*3/uL (ref 0.1–1.0)
Monocytes Relative: 6.7 % (ref 3.0–12.0)
Neutro Abs: 6 10*3/uL (ref 1.4–7.7)
Neutrophils Relative %: 62.7 % (ref 43.0–77.0)
Platelets: 316 10*3/uL (ref 150.0–400.0)
RBC: 4.28 Mil/uL (ref 3.87–5.11)
RDW: 15.5 % (ref 11.5–15.5)
WBC: 9.6 10*3/uL (ref 4.0–10.5)

## 2023-05-25 MED ORDER — FLUTICASONE PROPIONATE 50 MCG/ACT NA SUSP: 2.0000 | Freq: Every day | NASAL | 2 refills | Status: AC

## 2023-05-25 MED ORDER — LEVALBUTEROL TARTRATE 45 MCG/ACT IN AERO: 2.0000 | INHALATION_SPRAY | Freq: Four times a day (QID) | RESPIRATORY_TRACT | 3 refills | Status: AC | PRN

## 2023-05-25 MED ORDER — ZEPBOUND 2.5 MG/0.5ML ~~LOC~~ SOAJ: 2.5000 mg | SUBCUTANEOUS | 0 refills | Status: DC

## 2023-05-25 MED ORDER — ALBUTEROL SULFATE HFA 108 (90 BASE) MCG/ACT IN AERS
1.0000 | INHALATION_SPRAY | Freq: Four times a day (QID) | RESPIRATORY_TRACT | 0 refills | Status: DC | PRN
Start: 2023-05-25 — End: 2023-05-25

## 2023-05-25 NOTE — Telephone Encounter (Signed)
 Copied from CRM [id number]. Topic: Clinical - Red Word Triage >> May 25, 2023  9:58 AM Baldo Levan wrote: Red Word that prompted transfer to Nurse Triage: Patient is struggling with recent symptoms possibly due to a new medication. Patient is experiencing selling and shortness of breath.   Chief Complaint: Shortness of breath  Symptoms: Shortness of breath, cough, ankle swelling  Frequency: Intermittent  Pertinent Negatives: Patient denies fever Disposition: [] ED /[] Urgent Care (no appt availability in office) / [x] Appointment(In office/virtual)/ []  [place] [facility]/ [] Home Care/ [] Refused Recommended Disposition /[]  Mobile Bus/ []  Follow-up with PCP Additional Notes: Patient reports for the last couple of weeks she has had increased difficulty breathing when out of the house. She states that she has also been experiencing a cough and some ankle swelling. She denies any fevers. Patient is unsure if her symptom are due to her asthma or due to recently starting Nebivolol . Appointment made for the patient today for evaluation.    Reason for Disposition  [1] Longstanding difficulty breathing (e.g., CHF, COPD, emphysema) AND [2] WORSE than normal  Answer Assessment - Initial Assessment Questions 1. RESPIRATORY STATUS: "Describe your breathing?" (e.g., wheezing, shortness of breath, unable to speak, severe coughing)      Gets out of breath easily  2. ONSET: "When did this breathing problem begin?"      A couple of weeks  3. PATTERN "Does the difficult breathing come and go, or has it been constant since it started?"      Intermittent  4. SEVERITY: "How bad is your breathing?" (e.g., mild, moderate, severe)    - MILD: No SOB at rest, mild SOB with walking, speaks normally in sentences, can lie down, no retractions, pulse < 100.    - MODERATE: SOB at rest, SOB with minimal exertion and prefers to sit, cannot lie down flat, speaks in phrases, mild retractions, audible wheezing, pulse  100-120.    - SEVERE: Very SOB at rest, speaks in single words, struggling to breathe, sitting hunched forward, retractions, pulse > 120      Mild to moderate  5. RECURRENT SYMPTOM: "Have you had difficulty breathing before?" If Yes, ask: "When was the last time?" and "What happened that time?"      No 6. CARDIAC HISTORY: "Do you have any history of heart disease?" (e.g., heart attack, angina, bypass surgery, angioplasty)      No 7. LUNG HISTORY: "Do you have any history of lung disease?"  (e.g., pulmonary embolus, asthma, emphysema)     Yes 8. CAUSE: "What do you think is causing the breathing problem?"      Believes it is due to Nebivolol   9. OTHER SYMPTOMS: "Do you have any other symptoms? (e.g., dizziness, runny nose, cough, chest pain, fever)     Ankle swelling, cough  Protocols used: Breathing Difficulty-A-AH

## 2023-05-25 NOTE — Assessment & Plan Note (Signed)
 Set down was previously prescribed by her PCP.  Patient is awaiting a prior authorization.  I cannot tell from chart review if this has been initiated or not so I have refilled Zepbound  today to start this process again.

## 2023-05-25 NOTE — Assessment & Plan Note (Signed)
 Uncontrolled.  Continue Xyzal  daily.  Start Flonase  every morning.

## 2023-05-25 NOTE — Telephone Encounter (Addendum)
 Insurance is preferring Levalbuterol  HFA rather than Alb / ventolin  HFA. Verbal approved from [place] w/ cosign

## 2023-05-25 NOTE — Assessment & Plan Note (Signed)
 Discussed how allergies impacts asthma and suggested we get these under control.  Continue using Symbicort  twice daily as prescribed.  Initially prescribed an albuterol  inhaler for her to use as needed for wheezing, shortness of breath, or coughing fits.  However her insurance prefers Xopenex  so this has now been sent in.  We discussed she may use this rescue inhaler before doing activities such as walking outside since she knows this flares of her coughing and shortness of breath.

## 2023-05-25 NOTE — Assessment & Plan Note (Signed)
 No further surveillance recommended.

## 2023-05-25 NOTE — Progress Notes (Signed)
 Assessment & Plan:   Assessment & Plan Mild intermittent asthma without complication Discussed how allergies impacts asthma and suggested we get these under control.  Continue using Symbicort  twice daily as prescribed.  Initially prescribed an albuterol  inhaler for her to use as needed for wheezing, shortness of breath, or coughing fits.  However her insurance prefers Xopenex  so this has now been sent in.  We discussed she may use this rescue inhaler before doing activities such as walking outside since she knows this flares of her coughing and shortness of breath. Multiple pulmonary nodules determined by computed tomography of lung No further surveillance recommended. Seasonal allergies Uncontrolled.  Continue Xyzal  daily.  Start Flonase  every morning. Obesity, Class III, BMI 40-49.9 (morbid obesity) Set down was previously prescribed by her PCP.  Patient is awaiting a prior authorization.  I cannot tell from chart review if this has been initiated or not so I have refilled Zepbound  today to start this process again. Essential hypertension Mildly elevated today.  Encouraged patient to monitor her blood pressure at home and keep a log of her readings to bring with her to her follow-up appointment with her PCP.  Discussed her goal blood pressure is <150/90.  Advised to call if her systolic is consistently >180.  Bilateral swelling of feet and ankles CBC, BMP, CMP, and TSH to evaluate for other causes of swelling.  Encouraged her to elevate her feet throughout the day, decrease salt intake, and wear compression hose that should be applied first thing in the morning and removed at night.  Education provided on edema. OSA (obstructive sleep apnea) Managed by pulmonology.  Since weight is directly correlated to her sleep apnea Zepbound  was prescribed by her PCP and is being sent in again today as a prior authorization was not completed per chart review.   Follow up plan: Return for as scheduled  with PCP.  Hershel Los, MSN, APRN, FNP-C  Subjective:  HPI: [name] is a 68 y.o. female presenting on 05/25/2023 for Shortness of Breath (SOB, fatigue, dry mouth and runny nose x 1 month. Pt noticed these sx when the weather got warmer and she started the Nebivolol ) and Joint Swelling (Bilateral ankle swelling x 1 month. Denies pain. )  Patient reports shortness of breath, cough, fatigue, dry mouth, and bilateral ankle swelling for the past month.  Shortness of breath occurs with walking and is worse if she is outside in the heat.  She understands her shortness of breath is made worse by her weight but states she was unable to pick up Zepbound  from the pharmacy as it requires a prior authorization.  Cough is chronic and sometimes productive of white sputum; she does often have wheezing with her coughing.  She reports her symptoms started when the weather got warmer.  She questioned if her Bystolic  has anything to do with her symptoms.  Chart review reveals she initially started Bystolic  and 2018.  It was discontinued in 2019 for unknown reasons, but was during appointment at which she presented for a cough and dyspnea on exertion.  It was restarted in November 2024 due to uncontrolled hypertension.  She has a history of asthma and pulmonary nodules.  She is using Symbicort  twice daily and does not have an albuterol  inhaler.  I do not see any PFTs in her chart.  Her last chest CT was on 09/06/2021 at which time her pulmonary nodules are stable and no further follow-up was indicated. Patient had an echocardiogram completed on 07/31/2022  at which time estimated EF was 60 to 65%.  Left ventricular diastolic parameters consistent with grade 1 diastolic dysfunction.  She reports she has had a runny nose since last year. She has been taking Xyzal  daily.  She has Flonase  but does not take it every day.  She is getting frequent headaches.  Patient also reports her blood pressure has been up and down.  She can  tell when it is elevated as her ears start popping.  She had an appointment this morning at which time she was told her blood pressure was 156/88.  She does have blood pressure monitor at home.  Her ankles have been swelling for the past month.  She denies any pain.  Swelling is worse in the left than the right.  Denies eating a lot of salty foods.  She does have compression hose at home but does not wear them.    ROS: Negative unless specifically indicated above in HPI.   Relevant past medical history reviewed and updated as indicated.   Allergies and medications reviewed and updated.   Current Outpatient Medications:    albuterol  (VENTOLIN  HFA) 108 (90 Base) MCG/ACT inhaler, Inhale 1-2 puffs into the lungs every 6 (six) hours as needed for wheezing or shortness of breath., Disp: 18 g, Rfl: 0   budesonide -formoterol  (SYMBICORT ) 80-4.5 MCG/ACT inhaler, Inhale 2 puffs into the lungs 2 (two) times daily., Disp: 3 each, Rfl: 1   DULoxetine  (CYMBALTA ) 60 MG capsule, TAKE 1 CAPSULE BY MOUTH EVERY DAY, Disp: 90 capsule, Rfl: 0   esomeprazole  (NEXIUM ) 40 MG capsule, TAKE 1 CAPSULE (40 MG TOTAL) BY MOUTH DAILY AT 12 NOON., Disp: 90 capsule, Rfl: 0   estradiol (ESTRACE) 0.1 MG/GM vaginal cream, PLEASE SEE ATTACHED FOR DETAILED DIRECTIONS, Disp: , Rfl:    fluocinonide -emollient (LIDEX -E) 0.05 % cream, APPLY TO AFFECTED AREA TWICE A DAY, Disp: 60 g, Rfl: 1   folic acid  (FOLVITE ) 1 MG tablet, Take 1 tablet (1 mg total) by mouth daily., Disp: 90 tablet, Rfl: 0   Iron  Polysacch Cmplx-B12-FA 150-0.025-1 MG CAPS, Take 1 capsule by mouth daily., Disp: 90 capsule, Rfl: 1   LINZESS  290 MCG CAPS capsule, TAKE 1 CAPSULE (290 MCG TOTAL) BY MOUTH EVERY MORNING, Disp: 30 capsule, Rfl: 5   nebivolol  (BYSTOLIC ) 5 MG tablet, TAKE 1 TABLET (5 MG TOTAL) BY MOUTH DAILY., Disp: 90 tablet, Rfl: 0   OVER THE COUNTER MEDICATION, Take 500 mg by mouth daily as needed. Bc powder, Disp: , Rfl:    thiamine  (VITAMIN B1) 100 MG  tablet, Take 1 tablet (100 mg total) by mouth daily., Disp: 90 tablet, Rfl: 1   tirzepatide  (ZEPBOUND ) 2.5 MG/0.5ML Pen, Inject 2.5 mg into the skin once a week., Disp: 4 mL, Rfl: 0   triamterene  (DYRENIUM ) 50 MG capsule, TAKE 1 CAPSULE BY MOUTH EVERY DAY, Disp: 90 capsule, Rfl: 1   Vitamin D , Ergocalciferol , (DRISDOL ) 1.25 MG (50000 UNIT) CAPS capsule, TAKE 1 CAPSULE (50,000 UNITS TOTAL) BY MOUTH EVERY 7 (SEVEN) DAYS, Disp: 12 capsule, Rfl: 0   Zinc  50 MG TABS, Take 1 tablet (50 mg total) by mouth every other day., Disp: 45 tablet, Rfl: 1  Allergies  Allergen Reactions   Fastin  [Phentermine ]     headache   Erythromycin Nausea And Vomiting   Lisinopril     REACTION: cough, facial swelling    Objective:   BP (!) 150/88   Pulse 72   Temp 98.4 F (36.9 C)   Resp 20  Ht 5\' 3"  (1.6 m)   Wt 261 lb (118.4 kg)   SpO2 92%   BMI 46.23 kg/m    Physical Exam Vitals reviewed.  Constitutional:      General: She is not in acute distress.    Appearance: Normal appearance. She is not ill-appearing, toxic-appearing or diaphoretic.  HENT:     Head: Normocephalic and atraumatic.     Right Ear: Tympanic membrane, ear canal and external ear normal. There is no impacted cerumen.     Left Ear: Tympanic membrane, ear canal and external ear normal. There is no impacted cerumen.     Nose: Congestion present.     Right Turbinates: Enlarged. Not pale.     Left Turbinates: Enlarged. Not pale.     Right Sinus: No maxillary sinus tenderness or frontal sinus tenderness.     Left Sinus: No maxillary sinus tenderness or frontal sinus tenderness.  Eyes:     General: No scleral icterus.       Right eye: No discharge.        Left eye: No discharge.     Conjunctiva/sclera: Conjunctivae normal.  Cardiovascular:     Rate and Rhythm: Normal rate and regular rhythm.     Heart sounds: Normal heart sounds. No murmur heard.    No friction rub. No gallop.  Pulmonary:     Effort: Pulmonary effort is normal. No  respiratory distress.     Breath sounds: Normal breath sounds. No stridor. No wheezing, rhonchi or rales.  Musculoskeletal:        General: Normal range of motion.     Cervical back: Normal range of motion.     Right ankle: Swelling present.     Left ankle: Swelling present.  Skin:    General: Skin is warm and dry.     Capillary Refill: Capillary refill takes less than 2 seconds.  Neurological:     General: No focal deficit present.     Mental Status: She is alert and oriented to person, place, and time. Mental status is at baseline.  Psychiatric:        Mood and Affect: Mood normal.        Behavior: Behavior normal.        Thought Content: Thought content normal.        Judgment: Judgment normal.

## 2023-05-25 NOTE — Assessment & Plan Note (Signed)
 Managed by pulmonology.  Since weight is directly correlated to her sleep apnea Zepbound  was prescribed by her PCP and is being sent in again today as a prior authorization was not completed per chart review.

## 2023-05-25 NOTE — Patient Instructions (Signed)
 Goal BP <150/90. Call us  before your scheduled appointment if your top number is staying in >180.   Continue Xyzal  daily.  Start Flonase  every morning.  Use the Albuterol  (rescue inhaler) as needed for wheezing, shortness of breath, or coughing fits. You may use it before walking since you know this flares up your coughing and shortness of breath.

## 2023-05-25 NOTE — Assessment & Plan Note (Signed)
 Mildly elevated today.  Encouraged patient to monitor her blood pressure at home and keep a log of her readings to bring with her to her follow-up appointment with her PCP.  Discussed her goal blood pressure is <150/90.  Advised to call if her systolic is consistently >180.

## 2023-05-26 ENCOUNTER — Other Ambulatory Visit: Payer: Self-pay | Admitting: Internal Medicine

## 2023-05-26 ENCOUNTER — Other Ambulatory Visit: Payer: Self-pay | Admitting: Gastroenterology

## 2023-05-26 DIAGNOSIS — I10 Essential (primary) hypertension: Secondary | ICD-10-CM

## 2023-05-26 LAB — TSH: TSH: 1.8 u[IU]/mL (ref 0.35–5.50)

## 2023-05-26 LAB — BRAIN NATRIURETIC PEPTIDE: Pro B Natriuretic peptide (BNP): 131 pg/mL — ABNORMAL HIGH (ref 0.0–100.0)

## 2023-05-27 ENCOUNTER — Ambulatory Visit: Payer: Self-pay | Admitting: Family Medicine

## 2023-05-27 DIAGNOSIS — R7989 Other specified abnormal findings of blood chemistry: Secondary | ICD-10-CM

## 2023-05-27 DIAGNOSIS — R0602 Shortness of breath: Secondary | ICD-10-CM

## 2023-06-17 ENCOUNTER — Other Ambulatory Visit: Payer: Self-pay | Admitting: Family Medicine

## 2023-06-19 ENCOUNTER — Encounter (HOSPITAL_COMMUNITY): Payer: Self-pay | Admitting: Gastroenterology

## 2023-06-19 NOTE — Progress Notes (Signed)
 Attempted to obtain medical history for pre op call via telephone, unable to reach at this time. HIPAA compliant voicemail message left requesting return call to pre surgical testing department.

## 2023-06-24 ENCOUNTER — Other Ambulatory Visit: Payer: Self-pay | Admitting: Internal Medicine

## 2023-06-24 DIAGNOSIS — K5904 Chronic idiopathic constipation: Secondary | ICD-10-CM

## 2023-06-25 ENCOUNTER — Encounter (HOSPITAL_COMMUNITY): Payer: Self-pay | Admitting: Gastroenterology

## 2023-06-25 ENCOUNTER — Other Ambulatory Visit: Payer: Self-pay

## 2023-06-25 ENCOUNTER — Encounter: Payer: Self-pay | Admitting: Internal Medicine

## 2023-06-25 ENCOUNTER — Ambulatory Visit: Admitting: Internal Medicine

## 2023-06-25 VITALS — BP 148/88 | HR 66 | Temp 98.8°F | Resp 16 | Ht 63.0 in | Wt 255.6 lb

## 2023-06-25 DIAGNOSIS — I10 Essential (primary) hypertension: Secondary | ICD-10-CM | POA: Diagnosis not present

## 2023-06-25 DIAGNOSIS — R600 Localized edema: Secondary | ICD-10-CM | POA: Diagnosis not present

## 2023-06-25 DIAGNOSIS — R0789 Other chest pain: Secondary | ICD-10-CM

## 2023-06-25 DIAGNOSIS — E538 Deficiency of other specified B group vitamins: Secondary | ICD-10-CM | POA: Diagnosis not present

## 2023-06-25 DIAGNOSIS — R9431 Abnormal electrocardiogram [ECG] [EKG]: Secondary | ICD-10-CM

## 2023-06-25 DIAGNOSIS — K5904 Chronic idiopathic constipation: Secondary | ICD-10-CM | POA: Diagnosis not present

## 2023-06-25 DIAGNOSIS — R7303 Prediabetes: Secondary | ICD-10-CM

## 2023-06-25 DIAGNOSIS — D510 Vitamin B12 deficiency anemia due to intrinsic factor deficiency: Secondary | ICD-10-CM

## 2023-06-25 LAB — URINALYSIS, ROUTINE W REFLEX MICROSCOPIC
Bilirubin Urine: NEGATIVE
Hgb urine dipstick: NEGATIVE
Ketones, ur: NEGATIVE
Leukocytes,Ua: NEGATIVE
Nitrite: NEGATIVE
Specific Gravity, Urine: 1.02 (ref 1.000–1.030)
Total Protein, Urine: NEGATIVE
Urine Glucose: NEGATIVE
Urobilinogen, UA: 0.2 (ref 0.0–1.0)
pH: 6 (ref 5.0–8.0)

## 2023-06-25 LAB — TROPONIN I (HIGH SENSITIVITY): High Sens Troponin I: 6 ng/L (ref 2–17)

## 2023-06-25 LAB — HEMOGLOBIN A1C: Hgb A1c MFr Bld: 6 % (ref 4.6–6.5)

## 2023-06-25 LAB — BRAIN NATRIURETIC PEPTIDE: Pro B Natriuretic peptide (BNP): 89 pg/mL (ref 0.0–100.0)

## 2023-06-25 LAB — D-DIMER, QUANTITATIVE: D-Dimer, Quant: 0.38 ug{FEU}/mL (ref <0.50)

## 2023-06-25 MED ORDER — FOLIC ACID 1 MG PO TABS: 1.0000 mg | ORAL_TABLET | Freq: Every day | ORAL | 1 refills | Status: AC

## 2023-06-25 MED ORDER — LINZESS 290 MCG PO CAPS: 290.0000 ug | ORAL_CAPSULE | Freq: Every morning | ORAL | 5 refills | Status: DC

## 2023-06-25 MED ORDER — CYANOCOBALAMIN 1000 MCG/ML IJ SOLN
1000.0000 ug | Freq: Once | INTRAMUSCULAR | Status: AC
  Administered 2023-06-25: 1000 ug via INTRAMUSCULAR

## 2023-06-25 NOTE — Progress Notes (Addendum)
 PCP - Cristino Donna LOV 06-25-23 epic Cardiologist - LOV 07-03-22 Eilleen Grates, MD  PPM/ICD -  Device Orders -  Rep Notified -   Chest x-ray - 09-17-22 epic EKG - 01-22-23 epic Stress Test - 2019 epic ECHO - 07-31-22 epic Cardiac Cath -   Sleep Study -  CPAP - Yes  Fasting Blood Sugar -  Checks Blood Sugar _n/a____ times a day  Blood Thinner Instructions:n/a Aspirin Instructions:n/a  Pt. Has bowel prep instructions  Activity--Able to complete ADL's and climb a flight of stairs with no CP or SOB  Anesthesia review: HTN, OSA, pre-DM  Patient denies shortness of breath, fever, cough and chest pain at PAT appointment   All instructions explained to the patient, with a verbal understanding of the material. Patient agrees to go over the instructions while at home for a better understanding. Patient also instructed to self quarantine after being tested for COVID-19. The opportunity to ask questions was provided.

## 2023-06-25 NOTE — Anesthesia Preprocedure Evaluation (Signed)
 Anesthesia Evaluation  Patient identified by MRN, date of birth, ID band Patient awake    Reviewed: Allergy & Precautions, H&P , NPO status , Patient's Chart, lab work & pertinent test results  Airway Mallampati: III  TM Distance: >3 FB Neck ROM: Full    Dental no notable dental hx. (+) Teeth Intact, Dental Advisory Given   Pulmonary asthma , sleep apnea    Pulmonary exam normal breath sounds clear to auscultation       Cardiovascular Exercise Tolerance: Good hypertension, Pt. on medications and Pt. on home beta blockers  Rhythm:Regular Rate:Normal     Neuro/Psych   Anxiety Depression    negative neurological ROS     GI/Hepatic Neg liver ROS,GERD  Medicated,,  Endo/Other    Class 3 obesity  Renal/GU negative Renal ROS  negative genitourinary   Musculoskeletal  (+) Arthritis ,    Abdominal   Peds  Hematology  (+) Blood dyscrasia, anemia   Anesthesia Other Findings   Reproductive/Obstetrics negative OB ROS                             Anesthesia Physical Anesthesia Plan  ASA: 3  Anesthesia Plan: MAC   Post-op Pain Management: Minimal or no pain anticipated   Induction: Intravenous  PONV Risk Score and Plan: 2 and Propofol  infusion  Airway Management Planned: Natural Airway and Simple Face Mask  Additional Equipment:   Intra-op Plan:   Post-operative Plan:   Informed Consent: I have reviewed the patients History and Physical, chart, labs and discussed the procedure including the risks, benefits and alternatives for the proposed anesthesia with the patient or authorized representative who has indicated his/her understanding and acceptance.     Dental advisory given  Plan Discussed with: CRNA  Anesthesia Plan Comments:        Anesthesia Quick Evaluation

## 2023-06-25 NOTE — Progress Notes (Unsigned)
 Subjective:  Patient ID: [name], female    DOB: 17-May-1955  Age: 68 y.o. MRN: [id number]  CC: Hypertension and Weight Gain   HPI [name] presents for f/up ----  Discussed the use of AI scribe software for clinical note transcription with the patient, who gave verbal consent to proceed.  History of Present Illness   [name] is a 68 year old female who presents with ankle swelling, tingling in hands, and dry mouth.  She experiences swelling in her ankles and tingling in her hands, which she suspects may be related to her use of Bystolic . She also notes a sensation of dry mouth and has been gaining weight, although she has lost six pounds in the last month. She describes feeling fatigued but does not consider it severe enough to warrant discontinuing any medication. She is not currently taking B12 supplements due to a previous issue with medication interactions.  She has experienced occasional chest tightness, particularly when sitting still, which she attributes to stress from her job. She takes aspirin during these episodes, although she is uncertain of its effectiveness. No shortness of breath, painful swallowing, or trouble swallowing.   She has a history of constipation, stating that she has been unable to have regular bowel movements without medication since 2022. She recalls a period of significant stress before her mother's passing, during which she did not have a bowel movement for a week.       Outpatient Medications Prior to Visit  Medication Sig Dispense Refill   acetaminophen  (TYLENOL ) 500 MG tablet Take 1,000 mg by mouth in the morning and at bedtime.     albuterol  (VENTOLIN  HFA) 108 (90 Base) MCG/ACT inhaler INHALE 1-2 PUFFS BY MOUTH EVERY 6 HOURS AS NEEDED FOR WHEEZE OR SHORTNESS OF BREATH 18 each 1   budesonide -formoterol  (SYMBICORT ) 80-4.5 MCG/ACT inhaler Inhale 2 puffs into the lungs 2 (two) times daily. 3 each 1   DULoxetine  (CYMBALTA ) 60 MG capsule TAKE  1 CAPSULE BY MOUTH EVERY DAY 90 capsule 0   esomeprazole  (NEXIUM ) 40 MG capsule TAKE 1 CAPSULE (40 MG TOTAL) BY MOUTH DAILY AT 12 NOON. 90 capsule 0   estradiol (ESTRACE) 0.1 MG/GM vaginal cream PLEASE SEE ATTACHED FOR DETAILED DIRECTIONS     fluocinonide -emollient (LIDEX -E) 0.05 % cream APPLY TO AFFECTED AREA TWICE A DAY 60 g 1   fluticasone  (FLONASE ) 50 MCG/ACT nasal spray Place 2 sprays into both nostrils daily. 16 g 2   iron  polysaccharides (NIFEREX) 150 MG capsule Take 150 mg by mouth daily.     levalbuterol  (XOPENEX  HFA) 45 MCG/ACT inhaler Inhale 2 puffs into the lungs every 6 (six) hours as needed for wheezing. 15 g 3   nebivolol  (BYSTOLIC ) 5 MG tablet TAKE 1 TABLET (5 MG TOTAL) BY MOUTH DAILY. 90 tablet 0   triamterene  (DYRENIUM ) 50 MG capsule TAKE 1 CAPSULE BY MOUTH EVERY DAY 90 capsule 1   Vitamin D , Ergocalciferol , (DRISDOL ) 1.25 MG (50000 UNIT) CAPS capsule TAKE 1 CAPSULE (50,000 UNITS TOTAL) BY MOUTH EVERY 7 (SEVEN) DAYS 12 capsule 0   LINZESS  290 MCG CAPS capsule TAKE 1 CAPSULE (290 MCG TOTAL) BY MOUTH EVERY MORNING 30 capsule 5   folic acid  (FOLVITE ) 1 MG tablet Take 1 tablet (1 mg total) by mouth daily. (Patient not taking: Reported on 05/25/2023) 90 tablet 0   Iron  Polysacch Cmplx-B12-FA 150-0.025-1 MG CAPS Take 1 capsule by mouth daily. 90 capsule 1   OVER THE COUNTER MEDICATION Take 500 mg by mouth daily  as needed. Bc powder (Patient not taking: Reported on 05/25/2023)     thiamine  (VITAMIN B1) 100 MG tablet Take 1 tablet (100 mg total) by mouth daily. (Patient not taking: Reported on 05/25/2023) 90 tablet 1   tirzepatide  (ZEPBOUND ) 2.5 MG/0.5ML Pen Inject 2.5 mg into the skin once a week. 4 mL 0   Zinc  50 MG TABS Take 1 tablet (50 mg total) by mouth every other day. (Patient not taking: Reported on 05/25/2023) 45 tablet 1   No facility-administered medications prior to visit.    ROS Review of Systems  Constitutional:  Positive for fatigue. Negative for appetite change, chills,  diaphoresis and unexpected weight change.  HENT: Negative.    Respiratory:  Positive for chest tightness. Negative for cough, shortness of breath and wheezing.   Cardiovascular:  Positive for palpitations. Negative for chest pain and leg swelling.  Gastrointestinal:  Positive for constipation. Negative for abdominal pain, blood in stool, diarrhea, nausea, rectal pain and vomiting.  Genitourinary:  Negative for difficulty urinating and dysuria.  Musculoskeletal:  Positive for arthralgias. Negative for back pain and myalgias.  Skin: Negative.   Neurological:  Negative for dizziness, weakness, light-headedness and numbness.  Hematological:  Negative for adenopathy. Does not bruise/bleed easily.  Psychiatric/Behavioral: Negative.      Objective:  BP (!) 148/88 (BP Location: Left Arm, Patient Position: Sitting, Cuff Size: Normal)   Pulse 66   Temp 98.8 F (37.1 C) (Oral)   Resp 16   Ht 5' 3 (1.6 m)   Wt 255 lb 9.6 oz (115.9 kg)   SpO2 97%   BMI 45.28 kg/m   BP Readings from Last 3 Encounters:  06/26/23 138/71  06/25/23 (!) 148/88  05/25/23 (!) 150/88    Wt Readings from Last 3 Encounters:  06/26/23 255 lb (115.7 kg)  06/25/23 255 lb 9.6 oz (115.9 kg)  05/25/23 261 lb (118.4 kg)    Physical Exam Vitals reviewed.  Constitutional:      Appearance: She is obese.  HENT:     Nose: Nose normal.     Mouth/Throat:     Mouth: Mucous membranes are moist.   Eyes:     General: No scleral icterus.    Conjunctiva/sclera: Conjunctivae normal.    Cardiovascular:     Rate and Rhythm: Normal rate and regular rhythm.     Pulses: Normal pulses.     Heart sounds: No murmur heard.    No friction rub. No gallop.     Comments: EKG ---  NSR, 65 bpm ST/T wave changes in inferolateral leads are not new No LVH or Q waves Unchanged   Pulmonary:     Breath sounds: No stridor. No wheezing, rhonchi or rales.  Abdominal:     General: Abdomen is protuberant. Bowel sounds are normal. There  is no distension.     Palpations: Abdomen is soft. There is no hepatomegaly, splenomegaly or mass.     Tenderness: There is no abdominal tenderness. There is no guarding or rebound.   Musculoskeletal:     Cervical back: Neck supple.     Right lower leg: 1+ Pitting Edema present.     Left lower leg: 1+ Pitting Edema present.  Lymphadenopathy:     Cervical: No cervical adenopathy.   Skin:    General: Skin is warm and dry.     Coloration: Skin is not pale.     Findings: No rash.   Neurological:     General: No focal deficit present.  Mental Status: She is alert. Mental status is at baseline.   Psychiatric:        Mood and Affect: Mood normal.        Behavior: Behavior normal.     Lab Results  Component Value Date   WBC 9.6 05/25/2023   HGB 11.8 (L) 05/25/2023   HCT 36.6 05/25/2023   PLT 316.0 05/25/2023   GLUCOSE 92 05/25/2023   CHOL 181 11/19/2022   TRIG 81.0 11/19/2022   HDL 60.20 11/19/2022   LDLCALC 105 (H) 11/19/2022   ALT 17 05/25/2023   AST 18 05/25/2023   NA 138 05/25/2023   K 3.9 05/25/2023   CL 106 05/25/2023   CREATININE 0.88 05/25/2023   BUN 17 05/25/2023   CO2 24 05/25/2023   TSH 1.80 05/25/2023   HGBA1C 6.0 06/25/2023    DG BONE DENSITY (DXA) Result Date: 02/10/2023 EXAM: DUAL X-RAY ABSORPTIOMETRY (DXA) FOR BONE MINERAL DENSITY IMPRESSION: Referring Physician:  Arcadio Knuckles Your patient completed a bone mineral density test using GE Lunar iDXA system (analysis version: 16). Technologist: BEC PATIENT: Name: Anetra, Czerwinski Patient ID: [id number] Birth Date: [birth year] Height: 62.0 in. Sex: Female Measured: 02/10/2023 Weight: 254.6 lbs. Indications: Albuterol , Bariatric surgery, Bilateral Oophorectomy (65.51), Cymbalta , Estrogen Deficient, Hysterectomy, Nexium , Postmenopausal, Secondary Osteoporosis, Vitamin D  Deficient Fractures: NONE Treatments: Estradiol Vaginal Cream, Vitamin D  (E933.5) ASSESSMENT: The BMD measured at Femur Neck Right is 0.916 g/cm2  with a T-score of -0.9. This patient's diagnostic category is NORMAL according to World Health Organization [facility]) criteria. L1 & L4 were excluded due to degenerative changes. The quality of the exam is limited by patient body habitus. Site Region Measured Date Measured Age YA BMD Significant CHANGE T-score DualFemur Neck Right 2/10/2023    67.4         -0.9    0.916 g/cm2 AP Spine  L2-L3      2/10/2023    67.4         0.4     1.249 g/cm2 DualFemur Total Mean 2/10/2023    67.4         0.6     1.081 g/cm2 World Health Organization [facility]) criteria for post-menopausal, Caucasian Women: Normal       T-score at or above -1 SD Osteopenia   T-score between -1 and -2.5 SD Osteoporosis T-score at or below -2.5 SD RECOMMENDATION: 1. All patients should optimize calcium  and vitamin D  intake. 2. Consider FDA-approved medical therapies in postmenopausal women and men aged 47 years and older, based on the following: a. A hip or vertebral (clinical or morphometric) fracture. b. T-score = -2.5 at the femoral neck or spine after appropriate evaluation to exclude secondary causes. c. Low bone mass (T-score between -1.0 and -2.5 at the femoral neck or spine) and a 10-year probability of a hip fracture = 3% or a 10-year probability of a major osteoporosis-related fracture = 20% based on the [place] -adapted WHO algorithm. d. Clinician judgment and/or patient preferences may indicate treatment for people with 10-year fracture probabilities above or below these levels. FOLLOW-UP: Patients with diagnosis of osteoporosis or at high risk for fracture should have regular bone mineral density tests.? Patients eligible for Medicare are allowed routine testing every 2 years.? The testing frequency can be increased to one year for patients who have rapidly progressing disease, are receiving or discontinuing medical therapy to restore bone mass, or have additional risk factors. I have reviewed this study and agree with the findings. [facility] Electronically  Signed   By: Sundra Engel M.D.   On: 02/10/2023 08:46    Assessment & Plan:  Folate deficiency -     Folic Acid ; Take 1 tablet (1 mg total) by mouth daily.  Dispense: 90 tablet; Refill: 1  Vitamin B12 deficiency anemia due to intrinsic factor deficiency -     Cyanocobalamin   Prolonged Q-T interval on ECG  Chronic idiopathic constipation -     Linzess ; Take 1 capsule (290 mcg total) by mouth every morning.  Dispense: 30 capsule; Refill: 5  Essential hypertension- Her BP is adequately well controlled. -     EKG 12-Lead -     Urinalysis, Routine w reflex microscopic; Future  Prediabetes -     Hemoglobin A1c; Future  Bilateral leg edema -     Troponin I (High Sensitivity); Future -     Brain natriuretic peptide; Future -     D-dimer, quantitative; Future -     Urinalysis, Routine w reflex microscopic; Future  Abnormal electrocardiogram (ECG) (EKG)- ECHO pending. Will also evaluate with a CA CT. -     Troponin I (High Sensitivity); Future -     Brain natriuretic peptide; Future -     D-dimer, quantitative; Future -     CT CORONARY MORPH W/CTA COR W/SCORE W/CA W/CM &/OR WO/CM; Future  Feeling of chest tightness -     CT CORONARY MORPH W/CTA COR W/SCORE W/CA W/CM &/OR WO/CM; Future     Follow-up: Return in about 3 months (around 09/25/2023).  Sandra Crouch, MD

## 2023-06-25 NOTE — Patient Instructions (Signed)
 Hypertension, Adult High blood pressure (hypertension) is when the force of blood pumping through the arteries is too strong. The arteries are the blood vessels that carry blood from the heart throughout the body. Hypertension forces the heart to work harder to pump blood and may cause arteries to become narrow or stiff. Untreated or uncontrolled hypertension can lead to a heart attack, heart failure, a stroke, kidney disease, and other problems. A blood pressure reading consists of a higher number over a lower number. Ideally, your blood pressure should be below 120/80. The first ("top") number is called the systolic pressure. It is a measure of the pressure in your arteries as your heart beats. The second ("bottom") number is called the diastolic pressure. It is a measure of the pressure in your arteries as the heart relaxes. What are the causes? The exact cause of this condition is not known. There are some conditions that result in high blood pressure. What increases the risk? Certain factors may make you more likely to develop high blood pressure. Some of these risk factors are under your control, including: Smoking. Not getting enough exercise or physical activity. Being overweight. Having too much fat, sugar, calories, or salt (sodium) in your diet. Drinking too much alcohol. Other risk factors include: Having a personal history of heart disease, diabetes, high cholesterol, or kidney disease. Stress. Having a family history of high blood pressure and high cholesterol. Having obstructive sleep apnea. Age. The risk increases with age. What are the signs or symptoms? High blood pressure may not cause symptoms. Very high blood pressure (hypertensive crisis) may cause: Headache. Fast or irregular heartbeats (palpitations). Shortness of breath. Nosebleed. Nausea and vomiting. Vision changes. Severe chest pain, dizziness, and seizures. How is this diagnosed? This condition is diagnosed by  measuring your blood pressure while you are seated, with your arm resting on a flat surface, your legs uncrossed, and your feet flat on the floor. The cuff of the blood pressure monitor will be placed directly against the skin of your upper arm at the level of your heart. Blood pressure should be measured at least twice using the same arm. Certain conditions can cause a difference in blood pressure between your right and left arms. If you have a high blood pressure reading during one visit or you have normal blood pressure with other risk factors, you may be asked to: Return on a different day to have your blood pressure checked again. Monitor your blood pressure at home for 1 week or longer. If you are diagnosed with hypertension, you may have other blood or imaging tests to help your health care provider understand your overall risk for other conditions. How is this treated? This condition is treated by making healthy lifestyle changes, such as eating healthy foods, exercising more, and reducing your alcohol intake. You may be referred for counseling on a healthy diet and physical activity. Your health care provider may prescribe medicine if lifestyle changes are not enough to get your blood pressure under control and if: Your systolic blood pressure is above 130. Your diastolic blood pressure is above 80. Your personal target blood pressure may vary depending on your medical conditions, your age, and other factors. Follow these instructions at home: Eating and drinking  Eat a diet that is high in fiber and potassium, and low in sodium, added sugar, and fat. An example of this eating plan is called the DASH diet. DASH stands for Dietary Approaches to Stop Hypertension. To eat this way: Eat  plenty of fresh fruits and vegetables. Try to fill one half of your plate at each meal with fruits and vegetables. Eat whole grains, such as whole-wheat pasta, brown rice, or whole-grain bread. Fill about one  fourth of your plate with whole grains. Eat or drink low-fat dairy products, such as skim milk or low-fat yogurt. Avoid fatty cuts of meat, processed or cured meats, and poultry with skin. Fill about one fourth of your plate with lean proteins, such as fish, chicken without skin, beans, eggs, or tofu. Avoid pre-made and processed foods. These tend to be higher in sodium, added sugar, and fat. Reduce your daily sodium intake. Many people with hypertension should eat less than 1,500 mg of sodium a day. Do not drink alcohol if: Your health care provider tells you not to drink. You are pregnant, may be pregnant, or are planning to become pregnant. If you drink alcohol: Limit how much you have to: 0-1 drink a day for women. 0-2 drinks a day for men. Know how much alcohol is in your drink. In the [place] one drink equals one 12 oz bottle of beer (355 mL), one 5 oz glass of wine (148 mL), or one 1 oz glass of hard liquor (44 mL). Lifestyle  Work with your health care provider to maintain a healthy body weight or to lose weight. Ask what an ideal weight is for you. Get at least 30 minutes of exercise that causes your heart to beat faster (aerobic exercise) most days of the week. Activities may include walking, swimming, or biking. Include exercise to strengthen your muscles (resistance exercise), such as Pilates or lifting weights, as part of your weekly exercise routine. Try to do these types of exercises for 30 minutes at least 3 days a week. Do not use any products that contain nicotine or tobacco. These products include cigarettes, chewing tobacco, and vaping devices, such as e-cigarettes. If you need help quitting, ask your health care provider. Monitor your blood pressure at home as told by your health care provider. Keep all follow-up visits. This is important. Medicines Take over-the-counter and prescription medicines only as told by your health care provider. Follow directions carefully. Blood  pressure medicines must be taken as prescribed. Do not skip doses of blood pressure medicine. Doing this puts you at risk for problems and can make the medicine less effective. Ask your health care provider about side effects or reactions to medicines that you should watch for. Contact a health care provider if you: Think you are having a reaction to a medicine you are taking. Have headaches that keep coming back (recurring). Feel dizzy. Have swelling in your ankles. Have trouble with your vision. Get help right away if you: Develop a severe headache or confusion. Have unusual weakness or numbness. Feel faint. Have severe pain in your chest or abdomen. Vomit repeatedly. Have trouble breathing. These symptoms may be an emergency. Get help right away. Call 911. Do not wait to see if the symptoms will go away. Do not drive yourself to the hospital. Summary Hypertension is when the force of blood pumping through your arteries is too strong. If this condition is not controlled, it may put you at risk for serious complications. Your personal target blood pressure may vary depending on your medical conditions, your age, and other factors. For most people, a normal blood pressure is less than 120/80. Hypertension is treated with lifestyle changes, medicines, or a combination of both. Lifestyle changes include losing weight, eating a healthy,  low-sodium diet, exercising more, and limiting alcohol. This information is not intended to replace advice given to you by your health care provider. Make sure you discuss any questions you have with your health care provider. Document Revised: 10/30/2020 Document Reviewed: 10/30/2020 Elsevier Patient Education  2024 [employer].

## 2023-06-26 ENCOUNTER — Encounter (HOSPITAL_COMMUNITY)
Admission: RE | Disposition: A | Discharge status: Home / Self Care | Payer: Self-pay | Source: Home / Self Care | Attending: Gastroenterology

## 2023-06-26 ENCOUNTER — Ambulatory Visit (HOSPITAL_COMMUNITY)
Admission: RE | Admit: 2023-06-26 | Discharge: 2023-06-26 | Disposition: A | Discharge status: Home / Self Care | Attending: Gastroenterology | Admitting: Gastroenterology

## 2023-06-26 ENCOUNTER — Ambulatory Visit (HOSPITAL_COMMUNITY): Payer: Self-pay | Admitting: Anesthesiology

## 2023-06-26 ENCOUNTER — Encounter (HOSPITAL_COMMUNITY): Payer: Self-pay | Admitting: Gastroenterology

## 2023-06-26 ENCOUNTER — Ambulatory Visit: Payer: Self-pay | Admitting: Internal Medicine

## 2023-06-26 DIAGNOSIS — K5730 Diverticulosis of large intestine without perforation or abscess without bleeding: Secondary | ICD-10-CM | POA: Diagnosis not present

## 2023-06-26 DIAGNOSIS — I129 Hypertensive chronic kidney disease with stage 1 through stage 4 chronic kidney disease, or unspecified chronic kidney disease: Secondary | ICD-10-CM | POA: Diagnosis not present

## 2023-06-26 DIAGNOSIS — R0789 Other chest pain: Secondary | ICD-10-CM | POA: Insufficient documentation

## 2023-06-26 DIAGNOSIS — Z79899 Other long term (current) drug therapy: Secondary | ICD-10-CM | POA: Diagnosis not present

## 2023-06-26 DIAGNOSIS — K3189 Other diseases of stomach and duodenum: Secondary | ICD-10-CM | POA: Diagnosis not present

## 2023-06-26 DIAGNOSIS — I10 Essential (primary) hypertension: Secondary | ICD-10-CM | POA: Insufficient documentation

## 2023-06-26 DIAGNOSIS — N182 Chronic kidney disease, stage 2 (mild): Secondary | ICD-10-CM | POA: Diagnosis not present

## 2023-06-26 DIAGNOSIS — D509 Iron deficiency anemia, unspecified: Secondary | ICD-10-CM | POA: Insufficient documentation

## 2023-06-26 DIAGNOSIS — Z6841 Body Mass Index (BMI) 40.0 and over, adult: Secondary | ICD-10-CM | POA: Insufficient documentation

## 2023-06-26 DIAGNOSIS — G4730 Sleep apnea, unspecified: Secondary | ICD-10-CM | POA: Insufficient documentation

## 2023-06-26 DIAGNOSIS — K219 Gastro-esophageal reflux disease without esophagitis: Secondary | ICD-10-CM | POA: Insufficient documentation

## 2023-06-26 DIAGNOSIS — E66813 Obesity, class 3: Secondary | ICD-10-CM | POA: Insufficient documentation

## 2023-06-26 DIAGNOSIS — D175 Benign lipomatous neoplasm of intra-abdominal organs: Secondary | ICD-10-CM | POA: Diagnosis not present

## 2023-06-26 DIAGNOSIS — K2950 Unspecified chronic gastritis without bleeding: Secondary | ICD-10-CM | POA: Insufficient documentation

## 2023-06-26 HISTORY — PX: ESOPHAGOGASTRODUODENOSCOPY: SHX5428

## 2023-06-26 HISTORY — PX: COLONOSCOPY: SHX5424

## 2023-06-26 HISTORY — DX: Anemia, unspecified: D64.9

## 2023-06-26 HISTORY — DX: Pneumonia, unspecified organism: J18.9

## 2023-06-26 HISTORY — DX: Unspecified asthma, uncomplicated: J45.909

## 2023-06-26 LAB — HM COLONOSCOPY

## 2023-06-26 SURGERY — EGD (ESOPHAGOGASTRODUODENOSCOPY)
Anesthesia: Monitor Anesthesia Care

## 2023-06-26 MED ORDER — PROPOFOL 500 MG/50ML IV EMUL
INTRAVENOUS | Status: AC
  Filled 2023-06-26: qty 50

## 2023-06-26 MED ORDER — SODIUM CHLORIDE 0.9 % IV SOLN
INTRAVENOUS | Status: AC | PRN
  Administered 2023-06-26: 500 mL via INTRAMUSCULAR

## 2023-06-26 MED ORDER — GLYCOPYRROLATE PF 0.2 MG/ML IJ SOSY
PREFILLED_SYRINGE | INTRAMUSCULAR | Status: DC | PRN
Start: 2023-06-26 — End: 2023-06-26
  Administered 2023-06-26 (×2): .1 mg via INTRAVENOUS

## 2023-06-26 MED ORDER — PROPOFOL 1000 MG/100ML IV EMUL
INTRAVENOUS | Status: AC
  Filled 2023-06-26: qty 100

## 2023-06-26 MED ORDER — PROPOFOL 10 MG/ML IV BOLUS
INTRAVENOUS | Status: DC | PRN
  Administered 2023-06-26: 70 mg via INTRAVENOUS
  Administered 2023-06-26: 30 mg via INTRAVENOUS
  Administered 2023-06-26: 125 ug/kg/min via INTRAVENOUS

## 2023-06-26 MED ORDER — PROPOFOL 1000 MG/100ML IV EMUL
INTRAVENOUS | Status: AC
Start: 2023-06-26 — End: 2023-06-26
  Filled 2023-06-26: qty 100

## 2023-06-26 MED ORDER — LIDOCAINE 2% (20 MG/ML) 5 ML SYRINGE
INTRAMUSCULAR | Status: DC | PRN
Start: 2023-06-26 — End: 2023-06-26
  Administered 2023-06-26: 80 mg via INTRAVENOUS

## 2023-06-26 NOTE — Op Note (Signed)
 [facility] Patient Name: [name] Procedure Date: 06/26/2023 MRN: [id number] Attending MD: Alvis Jourdain , MD, [id number] Date of Birth: [birth year] CSN: [id number] Age: 68 Admit Type: Outpatient Procedure:                Colonoscopy Indications:              Iron  deficiency anemia Providers:                Alvis Jourdain, MD, Marden Shaggy, RN, Rinda Cheers,                            Technician Referring MD:              Medicines:                Propofol  per Anesthesia Complications:            No immediate complications. Estimated Blood Loss:     Estimated blood loss: none. Procedure:                Pre-Anesthesia Assessment:                           - Prior to the procedure, a History and Physical                            was performed, and patient medications and                            allergies were reviewed. The patient's tolerance of                            previous anesthesia was also reviewed. The risks                            and benefits of the procedure and the sedation                            options and risks were discussed with the patient.                            All questions were answered, and informed consent                            was obtained. Prior Anticoagulants: The patient has                            taken no anticoagulant or antiplatelet agents. ASA                            Grade Assessment: III - A patient with severe                            systemic disease. After reviewing the risks and                            benefits,  the patient was deemed in satisfactory                            condition to undergo the procedure.                           - Sedation was administered by an anesthesia                            professional. Deep sedation was attained.                           After obtaining informed consent, the colonoscope                            was passed under direct vision. Throughout the                             procedure, the patient's blood pressure, pulse, and                            oxygen saturations were monitored continuously. The                            PCF-HQ190L ([id number]) Olympus colonoscope was                            introduced through the anus and advanced to the the                            cecum, identified by appendiceal orifice and                            ileocecal valve. The colonoscopy was somewhat                            difficult due to significant looping. Successful                            completion of the procedure was aided by using                            manual pressure and straightening and shortening                            the scope to obtain bowel loop reduction. The                            ileocecal valve, appendiceal orifice, and rectum                            were photographed. The quality of the bowel  preparation was evaluated using the BBPS [facility]                            Bowel Preparation Scale) with scores of: Right                            Colon = 3, Transverse Colon = 3 and Left Colon = 3                            (entire mucosa seen well with no residual staining,                            small fragments of stool or opaque liquid). The                            total BBPS score equals 9. The patient tolerated                            the procedure well. Scope In: 7:47:36 AM Scope Out: 8:06:35 AM Scope Withdrawal Time: 0 hours 12 minutes 58 seconds  Total Procedure Duration: 0 hours 18 minutes 59 seconds  Findings:      Multiple large-mouthed and medium-mouthed diverticula were found in the       sigmoid colon, descending colon and transverse colon.      There was a large lipoma, at the ileocecal valve. Biopsies were taken       with a cold forceps for histology.      The ICV was rather large. Close gross examination did not show any       mucosal  irregularity. A cold biopsy was performed and the area was soft.       Examination of the biopsy site confirmed that it was a lipoma, grossly. Impression:               - Diverticulosis in the sigmoid colon, in the                            descending colon and in the transverse colon.                           - Large lipoma at the ileocecal valve. Biopsied. Moderate Sedation:      Not Applicable - Patient had care per Anesthesia. Recommendation:           - Patient has a contact number available for                            emergencies. The signs and symptoms of potential                            delayed complications were discussed with the                            patient. Return to normal activities tomorrow.  Written discharge instructions were provided to the                            patient.                           - Resume previous diet.                           - Continue present medications.                           - Await pathology results.                           - Repeat colonoscopy in 10 years for surveillance. Procedure Code(s):        --- Professional ---                           [phone], Colonoscopy, flexible; with biopsy, single                            or multiple Diagnosis Code(s):        --- Professional ---                           D50.9, Iron  deficiency anemia, unspecified                           D17.5, Benign lipomatous neoplasm of                            intra-abdominal organs                           K57.30, Diverticulosis of large intestine without                            perforation or abscess without bleeding CPT copyright 2022 American Medical Association. All rights reserved. The codes documented in this report are preliminary and upon coder review may  be revised to meet current compliance requirements. Alvis Jourdain, MD Alvis Jourdain, MD 06/26/2023 8:23:10 AM This report has been signed  electronically. Number of Addenda: 0

## 2023-06-26 NOTE — Op Note (Addendum)
 [facility] Patient Name: [name] Procedure Date: 06/26/2023 MRN: [id number] Attending MD: Alvis Jourdain , MD, [id number] Date of Birth: August 27, 1955 CSN: [id number] Age: 68 Admit Type: Outpatient Procedure:                Upper GI endoscopy Indications:              Iron  deficiency anemia Providers:                Alvis Jourdain, MD, Marden Shaggy, RN, Rinda Cheers,                            Technician Referring MD:              Medicines:                Propofol  per Anesthesia Complications:            No immediate complications. Estimated Blood Loss:     Estimated blood loss: none. Procedure:                Pre-Anesthesia Assessment:                           - Prior to the procedure, a History and Physical                            was performed, and patient medications and                            allergies were reviewed. The patient's tolerance of                            previous anesthesia was also reviewed. The risks                            and benefits of the procedure and the sedation                            options and risks were discussed with the patient.                            All questions were answered, and informed consent                            was obtained. Prior Anticoagulants: The patient has                            taken no anticoagulant or antiplatelet agents. ASA                            Grade Assessment: III - A patient with severe                            systemic disease. After reviewing the risks and  benefits, the patient was deemed in satisfactory                            condition to undergo the procedure.                           - Sedation was administered by an anesthesia                            professional. Deep sedation was attained.                           After obtaining informed consent, the endoscope was                            passed under direct vision. Throughout  the                            procedure, the patient's blood pressure, pulse, and                            oxygen saturations were monitored continuously. The                            PCF-HQ190L ([id number]) Olympus colonoscope was                            introduced through the mouth, and advanced to the                            second part of duodenum. The upper GI endoscopy was                            accomplished without difficulty. The patient                            tolerated the procedure well. Scope In: Scope Out: Findings:      The esophagus was normal.      Localized moderate inflammation characterized by erythema was found in       the gastric antrum. Biopsies were taken with a cold forceps for       Helicobacter pylori testing.      The examined duodenum was normal. Impression:               - Normal esophagus.                           - Gastritis. Biopsied.                           - Normal examined duodenum. Moderate Sedation:      Not Applicable - Patient had care per Anesthesia. Recommendation:           - Patient has a contact number available for  emergencies. The signs and symptoms of potential                            delayed complications were discussed with the                            patient. Return to normal activities tomorrow.                            Written discharge instructions were provided to the                            patient.                           - Resume previous diet.                           - Continue present medications.                           - Await pathology results.                           - Follow up with Dr. Tova Fresh in one month. Procedure Code(s):        --- Professional ---                           [id number], Esophagogastroduodenoscopy, flexible,                            transoral; with biopsy, single or multiple Diagnosis Code(s):        --- Professional ---                            K29.70, Gastritis, unspecified, without bleeding                           D50.9, Iron  deficiency anemia, unspecified CPT copyright 2022 American Medical Association. All rights reserved. The codes documented in this report are preliminary and upon coder review may  be revised to meet current compliance requirements. Alvis Jourdain, MD Alvis Jourdain, MD 06/26/2023 8:29:21 AM This report has been signed electronically. Number of Addenda: 0

## 2023-06-26 NOTE — H&P (Addendum)
 [name] HPI: This 68 year old black female presents to the office for colorectal cancer screening. She has 2 BM's per day with no obvious blood or mucus in the stool. She has a good appetite. She takes Esomeprazole  for acid reflux with good control. She has gained 19 pounds over the past 2 years which she feels is due to the immense stress she has been under due to family problems. She has been taking iron  supplements for iron  deficiency anemiaShe denies having any complaints of abdominal pain, nausea, vomiting, dysphagia or odynophagia. She denies having a family history of colon cancer, celiac sprue or IBD. Her last colonoscopy done on 02/22/2020 revealed scattered diverticulosis.  Past Medical History:  Diagnosis Date   Anemia    Anxiety disorder    Asthma    Chronic cough    Complication of anesthesia    Had ERCP in 2006 and led to respiratory failure, caused pancreatits, was in coma for 30days.   Depression    GERD (gastroesophageal reflux disease)    History of ERCP    History of pancreatitis    History of tracheostomy    HTN (hypertension)    Hyperglycemia    Hypokalemia    Pneumonia    Pre-diabetes    Respiratory failure (HCC)    Sleep apnea    CPAP    Past Surgical History:  Procedure Laterality Date   ABDOMINAL HYSTERECTOMY     Total Hysterectomy    APPENDECTOMY     BOTOX  INJECTION N/A 01/22/2023   Procedure: BOTOX  INJECTION 100 UNITS;  Surgeon: Roxane Copp, MD;  Location: [facility];  Service: Urology;  Laterality: N/A;   BREAST REDUCTION SURGERY     CHOLECYSTECTOMY     COLONOSCOPY WITH PROPOFOL  N/A 12/21/2015   Procedure: COLONOSCOPY WITH PROPOFOL ;  Surgeon: Alvis Jourdain, MD;  Location: [facility];  Service: Endoscopy;  Laterality: N/A;   CYSTOSCOPY WITH BIOPSY N/A 01/22/2023   Procedure: CYSTOSCOPY;  Surgeon: Roxane Copp, MD;  Location: [facility];  Service: Urology;  Laterality: N/A;  60 MINUTE CASE   ERCP      ESOPHAGOGASTRODUODENOSCOPY N/A 12/21/2015   Procedure: ESOPHAGOGASTRODUODENOSCOPY (EGD);  Surgeon: Alvis Jourdain, MD;  Location: [facility];  Service: Endoscopy;  Laterality: N/A;   GIVENS CAPSULE STUDY N/A 01/29/2016   Procedure: GIVENS CAPSULE STUDY;  Surgeon: Tami Falcon, MD;  Location: [facility] ENDOSCOPY;  Service: Endoscopy;  Laterality: N/A;   HAMMER TOE SURGERY     INTESTINAL MALROTATION REPAIR  1999   TUBAL LIGATION      Family History  Problem Relation Age of Onset   Diabetes Other    Hypertension Other     Social History:  reports that she has never smoked. She has never used smokeless tobacco. She reports that she does not drink alcohol and does not use drugs.  Allergies:  Allergies  Allergen Reactions   Fastin  [Phentermine ]     headache   Erythromycin Nausea And Vomiting   Lisinopril     REACTION: cough, facial swelling    Medications: Scheduled: Continuous:  sodium chloride  500 mL (06/26/23 0646)    Results for orders placed or performed in visit on 06/25/23 (from the past 24 hours)  Urinalysis, Routine w reflex microscopic     Status: Abnormal   Collection Time: 06/25/23  9:39 AM  Result Value Ref Range   Color, Urine YELLOW Yellow;Lt. Yellow;Straw;Dark Yellow;Amber;Green;Red;Brown   APPearance Sl Cloudy (A) Clear;Turbid;Slightly Cloudy;Cloudy   Specific Gravity,  Urine 1.020 1.000 - 1.030   pH 6.0 5.0 - 8.0   Total Protein, Urine NEGATIVE Negative   Urine Glucose NEGATIVE Negative   Ketones, ur NEGATIVE Negative   Bilirubin Urine NEGATIVE Negative   Hgb urine dipstick NEGATIVE Negative   Urobilinogen, UA 0.2 0.0 - 1.0   Leukocytes,Ua NEGATIVE Negative   Nitrite NEGATIVE Negative   WBC, UA 0-2/hpf 0-2/hpf   RBC / HPF 0-2/hpf 0-2/hpf   Mucus, UA Presence of (A) None   Squamous Epithelial / HPF Many(>10/hpf) (A) Rare(0-4/hpf)   Bacteria, UA Few(10-50/hpf) (A) None  Hemoglobin A1c     Status: None   Collection Time: 06/25/23  9:39 AM  Result Value Ref Range    Hgb A1c MFr Bld 6.0 4.6 - 6.5 %  D-dimer, quantitative     Status: None   Collection Time: 06/25/23  9:39 AM  Result Value Ref Range   D-Dimer, Quant 0.38 <0.50 mcg/mL FEU  Brain natriuretic peptide     Status: None   Collection Time: 06/25/23  9:39 AM  Result Value Ref Range   Pro B Natriuretic peptide (BNP) 89.0 0.0 - 100.0 pg/mL  Troponin I (High Sensitivity)     Status: None   Collection Time: 06/25/23  9:39 AM  Result Value Ref Range   High Sens Troponin I 6 2 - 17 ng/L     No results found.  ROS:  As stated above in the HPI otherwise negative.  Blood pressure (!) 171/79, pulse 78, temperature 98.2 F (36.8 C), temperature source Oral, resp. rate 15, height 5' 3 (1.6 m), weight 115.7 kg, SpO2 98%.    PE: Gen: NAD, Alert and Oriented HEENT:  [place]/AT, EOMI Neck: Supple, no LAD Lungs: CTA Bilaterally CV: RRR without M/G/R ABD: Soft, NTND, +BS Ext: No C/C/E  Assessment/Plan: 1) IDA. 2) GERD.  Plan: 1) EGD/colonoscopy.  [name] D 06/26/2023, 7:20 AM

## 2023-06-26 NOTE — Discharge Instructions (Signed)

## 2023-06-26 NOTE — Anesthesia Postprocedure Evaluation (Signed)
 Anesthesia Post Note  Patient: [name]  Procedure(s) Performed: EGD (ESOPHAGOGASTRODUODENOSCOPY) COLONOSCOPY     Patient location during evaluation: Endoscopy Anesthesia Type: MAC Level of consciousness: awake and alert Pain management: pain level controlled Vital Signs Assessment: post-procedure vital signs reviewed and stable Respiratory status: spontaneous breathing, nonlabored ventilation and respiratory function stable Cardiovascular status: stable and blood pressure returned to baseline Postop Assessment: no apparent nausea or vomiting Anesthetic complications: no  No notable events documented.  Last Vitals:  Vitals:   06/26/23 0830 06/26/23 0835  BP: 138/71 (!) 154/78  Pulse: 75 73  Resp: (!) 30 19  Temp:    SpO2: 98% 98%    Last Pain:  Vitals:   06/26/23 0835  TempSrc:   PainSc: 0-No pain                 Emmett Arntz,W. EDMOND

## 2023-06-26 NOTE — Anesthesia Procedure Notes (Addendum)
 Date/Time: 06/26/2023 7:37 AM  Performed by: Juluis Ok, CRNAOxygen Delivery Method: Simple face mask Comments: POM face mask.

## 2023-06-26 NOTE — Addendum Note (Signed)
 Addendum  created 06/26/23 1617 by Juluis Ok, CRNA   Clinical Note Signed, Intraprocedure Blocks edited

## 2023-06-26 NOTE — Transfer of Care (Addendum)
 Immediate Anesthesia Transfer of Care Note  Patient: [name]  Procedure(s) Performed: EGD (ESOPHAGOGASTRODUODENOSCOPY) COLONOSCOPY  Patient Location: Endoscopy Unit  Anesthesia Type:MAC  Level of Consciousness: awake and patient cooperative  Airway & Oxygen Therapy: Patient Spontanous Breathing and Patient connected to face mask oxygen  Post-op Assessment: Report given to RN and Post -op Vital signs reviewed and stable  Post vital signs: Reviewed and stable  Last Vitals:  Vitals Value Taken Time  BP 149/66 06/26/23   0816  Temp 36.2 06/26/23   0816  Pulse 88 06/26/23   0816  Resp 24 06/26/23   0816  SpO2 100% 06/26/23   0816    Last Pain:  Vitals:   06/26/23 0643  TempSrc: Oral  PainSc: 0-No pain         Complications: No notable events documented.

## 2023-06-27 ENCOUNTER — Encounter (HOSPITAL_COMMUNITY): Payer: Self-pay | Admitting: Gastroenterology

## 2023-06-29 LAB — SURGICAL PATHOLOGY

## 2023-07-06 ENCOUNTER — Ambulatory Visit (HOSPITAL_COMMUNITY)
Admission: RE | Admit: 2023-07-06 | Source: Ambulatory Visit | Attending: Internal Medicine | Admitting: Internal Medicine

## 2023-07-09 ENCOUNTER — Telehealth (HOSPITAL_COMMUNITY): Payer: Self-pay | Admitting: *Deleted

## 2023-07-09 ENCOUNTER — Encounter (HOSPITAL_COMMUNITY): Payer: Self-pay

## 2023-07-09 NOTE — Telephone Encounter (Signed)
 Attempted to call patient regarding upcoming cardiac CT appointment. Left message on voicemail with name and callback number  Larey Brick RN Navigator Cardiac Imaging [facility] [phone] Office [phone] Cell

## 2023-07-13 ENCOUNTER — Ambulatory Visit (HOSPITAL_COMMUNITY)
Admission: RE | Admit: 2023-07-13 | Discharge: 2023-07-13 | Disposition: A | Discharge status: Home / Self Care | Source: Ambulatory Visit | Attending: Cardiology | Admitting: Cardiology

## 2023-07-13 ENCOUNTER — Ambulatory Visit (HOSPITAL_COMMUNITY)
Admission: RE | Admit: 2023-07-13 | Discharge: 2023-07-13 | Disposition: A | Discharge status: Home / Self Care | Source: Ambulatory Visit | Attending: Internal Medicine | Admitting: Internal Medicine

## 2023-07-13 DIAGNOSIS — I129 Hypertensive chronic kidney disease with stage 1 through stage 4 chronic kidney disease, or unspecified chronic kidney disease: Secondary | ICD-10-CM | POA: Diagnosis not present

## 2023-07-13 DIAGNOSIS — R0600 Dyspnea, unspecified: Secondary | ICD-10-CM

## 2023-07-13 DIAGNOSIS — I2510 Atherosclerotic heart disease of native coronary artery without angina pectoris: Secondary | ICD-10-CM | POA: Insufficient documentation

## 2023-07-13 DIAGNOSIS — R0789 Other chest pain: Secondary | ICD-10-CM | POA: Diagnosis not present

## 2023-07-13 DIAGNOSIS — R9431 Abnormal electrocardiogram [ECG] [EKG]: Secondary | ICD-10-CM | POA: Insufficient documentation

## 2023-07-13 DIAGNOSIS — R918 Other nonspecific abnormal finding of lung field: Secondary | ICD-10-CM | POA: Diagnosis not present

## 2023-07-13 DIAGNOSIS — R0602 Shortness of breath: Secondary | ICD-10-CM | POA: Diagnosis present

## 2023-07-13 DIAGNOSIS — Z978 Presence of other specified devices: Secondary | ICD-10-CM | POA: Diagnosis not present

## 2023-07-13 DIAGNOSIS — G4730 Sleep apnea, unspecified: Secondary | ICD-10-CM | POA: Insufficient documentation

## 2023-07-13 DIAGNOSIS — K449 Diaphragmatic hernia without obstruction or gangrene: Secondary | ICD-10-CM | POA: Insufficient documentation

## 2023-07-13 DIAGNOSIS — N189 Chronic kidney disease, unspecified: Secondary | ICD-10-CM | POA: Insufficient documentation

## 2023-07-13 DIAGNOSIS — R7989 Other specified abnormal findings of blood chemistry: Secondary | ICD-10-CM | POA: Diagnosis present

## 2023-07-13 DIAGNOSIS — E785 Hyperlipidemia, unspecified: Secondary | ICD-10-CM | POA: Insufficient documentation

## 2023-07-13 LAB — ECHOCARDIOGRAM COMPLETE
Area-P 1/2: 3.95 cm2
S' Lateral: 3.3 cm

## 2023-07-13 MED ORDER — NITROGLYCERIN 0.4 MG SL SUBL
SUBLINGUAL_TABLET | SUBLINGUAL | Status: AC
  Filled 2023-07-13: qty 2

## 2023-07-13 MED ORDER — IOHEXOL 350 MG/ML SOLN
100.0000 mL | Freq: Once | INTRAVENOUS | Status: AC | PRN
  Administered 2023-07-13: 100 mL via INTRAVENOUS

## 2023-07-13 MED ORDER — NITROGLYCERIN 0.4 MG SL SUBL
0.8000 mg | SUBLINGUAL_TABLET | Freq: Once | SUBLINGUAL | Status: AC
  Administered 2023-07-13: 0.8 mg via SUBLINGUAL

## 2023-07-14 ENCOUNTER — Encounter: Payer: Self-pay | Admitting: Internal Medicine

## 2023-07-15 ENCOUNTER — Other Ambulatory Visit: Payer: Self-pay | Admitting: Internal Medicine

## 2023-07-15 DIAGNOSIS — I272 Pulmonary hypertension, unspecified: Secondary | ICD-10-CM | POA: Insufficient documentation

## 2023-07-17 ENCOUNTER — Ambulatory Visit: Payer: Self-pay | Admitting: Family Medicine

## 2023-07-18 ENCOUNTER — Encounter: Payer: Self-pay | Admitting: Internal Medicine

## 2023-07-20 ENCOUNTER — Other Ambulatory Visit: Payer: Self-pay | Admitting: Internal Medicine

## 2023-07-20 DIAGNOSIS — J189 Pneumonia, unspecified organism: Secondary | ICD-10-CM | POA: Insufficient documentation

## 2023-07-20 MED ORDER — AMOXICILLIN-POT CLAVULANATE 875-125 MG PO TABS
1.0000 | ORAL_TABLET | Freq: Two times a day (BID) | ORAL | 0 refills | Status: AC
Start: 2023-07-20 — End: 2023-07-30

## 2023-07-20 NOTE — Progress Notes (Unsigned)
 CT CORONARY MORPH W/CTA COR W/SCORE W/CA W/CM &/OR WO/CM Addendum Date: 07/14/2023 ADDENDUM REPORT: 07/14/2023 15:51 CLINICAL DATA:  68 yo female with chest pain/anginal equiv, high CAD risk, not treadmill candidate EXAM: Cardiac/Coronary CTA TECHNIQUE: A non-contrast, gated CT scan was obtained with axial slices of 2.5 mm through the heart for calcium  scoring. Calcium  scoring was performed using the Agatston method. A 120 kV prospective, gated, contrast cardiac CT scan was obtained. Gantry rotation speed was 230 msec and collimation was 0.63 mm. Two sublingual nitroglycerin  tablets (0.8 mg) were given. The 3D data set was reconstructed with motion correction for the best systolic or diastolic phase. Images were analyzed on a dedicated workstation using MPR, MIP, and VRT modes. The patient received OMNIPAQUE  IOHEXOL  350 MG/ML SOLN of contrast. FINDINGS: Image quality: Excellent. Noise artifact is: Limited. Coronary arteries: Normal coronary origins.  Right dominance. Right Coronary Artery: Dominant. No disease. Normal R-PLB and R-PDA branches. Left Main Coronary Artery: Normal. Bifurcates into the LAD and LCx arteries. Left Anterior Descending Coronary Artery: Large anterior artery that wraps around the apex. No disease. 2 small high diagonal branches (<2 mm) without significant stenosis. Left Circumflex Artery: AV groove vessel, no disease. Aorta: Normal size, 31 mm at the mid ascending aorta (level of the PA bifurcation) measured double oblique. No calcifications. No dissection. Aortic Valve: Trileaflet. No calcifications. Other findings: Normal pulmonary vein drainage into the left atrium. Normal left atrial appendage without a thrombus. Dilated main pulmonary artery to 35 mm, possibly suggestive of pulmonary hypertension. Extra-cardiac findings: See attached radiology report for non-cardiac structures. IMPRESSION: 1. No evidence of CAD, CADRADS = 0. 2. Normal coronary origin with right dominance. 3.  Coronary artery calcium  score is 0. 4. Dilated main pulmonary artery to 35 mm, possibly suggestive of pulmonary hypertension. 5. Consider non-coronary causes of chest pain. RECOMMENDATIONS: 1. CAD-RADS 0: No evidence of CAD (0%). Consider non-atherosclerotic causes of chest pain. 2. CAD-RADS 1: Minimal non-obstructive CAD (0-24%). Consider non-atherosclerotic causes of chest pain. Consider preventive therapy and risk factor modification. 3. CAD-RADS 2: Mild non-obstructive CAD (25-49%). Consider non-atherosclerotic causes of chest pain. Consider preventive therapy and risk factor modification. 4. CAD-RADS 3: Moderate stenosis. Consider symptom-guided anti-ischemic pharmacotherapy as well as risk factor modification per guideline directed care. Additional analysis with CT FFR will be submitted. 5. CAD-RADS 4: Severe stenosis. (70-99% or > 50% left main). Cardiac catheterization or CT FFR is recommended. Consider symptom-guided anti-ischemic pharmacotherapy as well as risk factor modification per guideline directed care. Invasive coronary angiography recommended with revascularization per published guideline statements. 6. CAD-RADS 5: Total coronary occlusion (100%). Consider cardiac catheterization or viability assessment. Consider symptom-guided anti-ischemic pharmacotherapy as well as risk factor modification per guideline directed care. 7. CAD-RADS N: Non-diagnostic study. Obstructive CAD can't be excluded. Alternative evaluation is recommended. Electronically Signed   By: Vinie JAYSON Maxcy M.D.   On: 07/14/2023 15:51   Addendum Date: 07/14/2023 ADDENDUM REPORT: 07/14/2023 15:51 CLINICAL DATA:  68 yo female with chest pain/anginal equiv, high CAD risk, not treadmill candidate EXAM: Cardiac/Coronary CTA TECHNIQUE: A non-contrast, gated CT scan was obtained with axial slices of 2.5 mm through the heart for calcium  scoring. Calcium  scoring was performed using the Agatston method. A 120 kV prospective, gated, contrast  cardiac CT scan was obtained. Gantry rotation speed was 230 msec and collimation was 0.63 mm. Two sublingual nitroglycerin  tablets (0.8 mg) were given. The 3D data set was reconstructed with motion correction for the best systolic or diastolic phase. Images were analyzed  on a dedicated workstation using MPR, MIP, and VRT modes. The patient received 100mL OMNIPAQUE  IOHEXOL  350 MG/ML SOLN of contrast. FINDINGS: Image quality: Excellent. Noise artifact is: Limited. Coronary arteries: Normal coronary origins.  Right dominance. Right Coronary Artery: Dominant. No disease. Normal R-PLB and R-PDA branches. Left Main Coronary Artery: Normal. Bifurcates into the LAD and LCx arteries. Left Anterior Descending Coronary Artery: Large anterior artery that wraps around the apex. No disease. 2 small high diagonal branches (<2 mm) without significant stenosis. Left Circumflex Artery: AV groove vessel, no disease. Aorta: Normal size, 31 mm at the mid ascending aorta (level of the PA bifurcation) measured double oblique. No calcifications. No dissection. Aortic Valve: Trileaflet. No calcifications. Other findings: Normal pulmonary vein drainage into the left atrium. Normal left atrial appendage without a thrombus. Dilated main pulmonary artery to 35 mm, possibly suggestive of pulmonary hypertension. Extra-cardiac findings: See attached radiology report for non-cardiac structures. IMPRESSION: 1. No evidence of CAD, CADRADS = 0. 2. Normal coronary origin with right dominance. 3. Coronary artery calcium  score is 0. 4. Dilated main pulmonary artery to 35 mm, possibly suggestive of pulmonary hypertension. 5. Consider non-coronary causes of chest pain. RECOMMENDATIONS: 1. CAD-RADS 0: No evidence of CAD (0%). Consider non-atherosclerotic causes of chest pain. 2. CAD-RADS 1: Minimal non-obstructive CAD (0-24%). Consider non-atherosclerotic causes of chest pain. Consider preventive therapy and risk factor modification. 3. CAD-RADS 2: Mild  non-obstructive CAD (25-49%). Consider non-atherosclerotic causes of chest pain. Consider preventive therapy and risk factor modification. 4. CAD-RADS 3: Moderate stenosis. Consider symptom-guided anti-ischemic pharmacotherapy as well as risk factor modification per guideline directed care. Additional analysis with CT FFR will be submitted. 5. CAD-RADS 4: Severe stenosis. (70-99% or > 50% left main). Cardiac catheterization or CT FFR is recommended. Consider symptom-guided anti-ischemic pharmacotherapy as well as risk factor modification per guideline directed care. Invasive coronary angiography recommended with revascularization per published guideline statements. 6. CAD-RADS 5: Total coronary occlusion (100%). Consider cardiac catheterization or viability assessment. Consider symptom-guided anti-ischemic pharmacotherapy as well as risk factor modification per guideline directed care. 7. CAD-RADS N: Non-diagnostic study. Obstructive CAD can't be excluded. Alternative evaluation is recommended. Electronically Signed   By: Vinie JAYSON Maxcy M.D.   On: 07/14/2023 15:51   Result Date: 07/14/2023 EXAM: OVER-READ INTERPRETATION  CT CHEST The following report is a limited chest CT over-read performed by radiologist Dr. Kate Plummer of [facility] on 07/13/2023. This over-read does not include interpretation of cardiac or coronary anatomy or pathology. The calcium  score CT interpretation by the cardiologist is attached. COMPARISON:  CT chest 09/06/2021, CT chest 10/19/2017 FINDINGS: Stable poorly visualized couple of right lower lobe 8 mm pulmonary nodules. Associated overlying patchy airspace opacities of the right lower lobe. Atelectasis of the right middle lobe and lingula. Chronic stable pulmonary nodules within the left lower lobe and right middle lobe-no further follow-up indicated. No lymphadenopathy. Gastric lap band appropriately position with associated tiny hiatal hernia. No acute osseous  abnormality. Multilevel degenerative changes of the spine. No acute soft tissue abnormality. IMPRESSION: 1. Patchy airspace opacity of the right lower lobe may represent atelectasis versus infection. Underlying grossly stable 8 mm chronic right lower lobe pulmonary nodules not well visualized. No further follow-up indicated of the pulmonary nodules due to chronicity. Followup PA and lateral chest X-ray is recommended in 3-4 weeks to ensure resolution of patchy airspace opacity. 2. Gastric lap band appropriately position with associated tiny hiatal hernia. Electronically Signed: By: Morgane  Naveau M.D. On: 07/13/2023 14:16  ECHOCARDIOGRAM COMPLETE Result Date: 07/13/2023    ECHOCARDIOGRAM REPORT   Patient Name:   [name] Date of Exam: 07/13/2023 Medical Rec #:  [id number]     Height:       63.0 in Accession #:    [id number]    Weight:       255.0 lb Date of Birth:  01-12-1955      BSA:          2.144 m Patient Age:    67 years      BP:           166/94 mmHg Patient Gender: F             HR:           65 bpm. Exam Location:  [address] Procedure: 2D Echo, 3D Echo, Cardiac Doppler, Color Doppler and Strain Analysis            (Both Spectral and Color Flow Doppler were utilized during            procedure). Indications:    R06.00 Dyspnea  History:        Patient has prior history of Echocardiogram examinations, most                 recent 07/31/2022. Abnormal ECG, CKD; Risk Factors:Sleep Apnea,                 HLD and Hypertension.  Sonographer:    Waldo Guadalajara RCS Referring Phys: [id number] NIKI FALCON JOYCE IMPRESSIONS  1. Left ventricular ejection fraction, by estimation, is 55 to 60%. Left ventricular ejection fraction by 3D volume is 56 %. The left ventricle has normal function. The left ventricle has no regional wall motion abnormalities. Left ventricular diastolic  parameters are indeterminate. The average left ventricular global longitudinal strain is -19.5 %. The global longitudinal strain is normal.  2.  Right ventricular systolic function is normal. The right ventricular size is normal. There is normal pulmonary artery systolic pressure.  3. The mitral valve is normal in structure. Trivial mitral valve regurgitation. No evidence of mitral stenosis.  4. The aortic valve is tricuspid. Aortic valve regurgitation is not visualized. No aortic stenosis is present.  5. The inferior vena cava is normal in size with greater than 50% respiratory variability, suggesting right atrial pressure of 3 mmHg. FINDINGS  Left Ventricle: Left ventricular ejection fraction, by estimation, is 55 to 60%. Left ventricular ejection fraction by 3D volume is 56 %. The left ventricle has normal function. The left ventricle has no regional wall motion abnormalities. The average left ventricular global longitudinal strain is -19.5 %. Strain was performed and the global longitudinal strain is normal. The left ventricular internal cavity size was normal in size. There is no left ventricular hypertrophy. Left ventricular diastolic parameters are indeterminate. Right Ventricle: The right ventricular size is normal. No increase in right ventricular wall thickness. Right ventricular systolic function is normal. There is normal pulmonary artery systolic pressure. The tricuspid regurgitant velocity is 2.42 m/s, and  with an assumed right atrial pressure of 3 mmHg, the estimated right ventricular systolic pressure is 26.4 mmHg. Left Atrium: Left atrial size was normal in size. Right Atrium: Right atrial size was normal in size. Pericardium: There is no evidence of pericardial effusion. Mitral Valve: The mitral valve is normal in structure. Trivial mitral valve regurgitation. No evidence of mitral valve stenosis. Tricuspid Valve: The tricuspid valve is normal in structure. Tricuspid valve regurgitation is mild .  No evidence of tricuspid stenosis. Aortic Valve: The aortic valve is tricuspid. Aortic valve regurgitation is not visualized. No aortic stenosis  is present. Pulmonic Valve: The pulmonic valve was normal in structure. Pulmonic valve regurgitation is trivial. No evidence of pulmonic stenosis. Aorta: The aortic root is normal in size and structure. Venous: The inferior vena cava is normal in size with greater than 50% respiratory variability, suggesting right atrial pressure of 3 mmHg. IAS/Shunts: No atrial level shunt detected by color flow Doppler. Additional Comments: 3D was performed not requiring image post processing on an independent workstation and was normal.  LEFT VENTRICLE PLAX 2D LVIDd:         4.80 cm         Diastology LVIDs:         3.30 cm         LV e' medial:    5.98 cm/s LV PW:         1.20 cm         LV E/e' medial:  9.7 LV IVS:        0.90 cm         LV e' lateral:   7.62 cm/s LVOT diam:     2.00 cm         LV E/e' lateral: 7.6 LV SV:         61 LV SV Index:   28              2D Longitudinal LVOT Area:     3.14 cm        Strain                                2D Strain GLS   -19.5 %                                Avg:                                 3D Volume EF                                LV 3D EF:    Left                                             ventricul                                             ar                                             ejection                                             fraction  by 3D                                             volume is                                             56 %.                                 3D Volume EF:                                3D EF:        56 %                                LV EDV:       120 ml                                LV ESV:       53 ml                                LV SV:        67 ml RIGHT VENTRICLE RV Basal diam:  2.50 cm RV S prime:     13.80 cm/s TAPSE (M-mode): 2.3 cm RVSP:           26.4 mmHg LEFT ATRIUM             Index        RIGHT ATRIUM           Index LA diam:        3.70 cm 1.73 cm/m   RA Pressure:  3.00 mmHg LA Vol (A2C):   56.4 ml 26.31 ml/m  RA Area:     10.40 cm LA Vol (A4C):   41.7 ml 19.45 ml/m  RA Volume:   22.40 ml  10.45 ml/m LA Biplane Vol: 48.8 ml 22.76 ml/m  AORTIC VALVE LVOT Vmax:   87.50 cm/s LVOT Vmean:  58.700 cm/s LVOT VTI:    0.193 m  AORTA Ao Root diam: 2.90 cm Ao Asc diam:  3.40 cm MITRAL VALVE               TRICUSPID VALVE MV Area (PHT):             TR Peak grad:   23.4 mmHg MV Decel Time:             TR Vmax:        242.00 cm/s MV E velocity: 58.20 cm/s  Estimated RAP:  3.00 mmHg MV A velocity: 70.20 cm/s  RVSP:           26.4 mmHg MV E/A ratio:  0.83                            SHUNTS  Systemic VTI:  0.19 m                            Systemic Diam: 2.00 cm Morene Brownie Electronically signed by Morene Brownie Signature Date/Time: 07/13/2023/8:40:20 AM    Final

## 2023-08-03 ENCOUNTER — Other Ambulatory Visit: Payer: Self-pay | Admitting: Internal Medicine

## 2023-08-03 DIAGNOSIS — E559 Vitamin D deficiency, unspecified: Secondary | ICD-10-CM

## 2023-08-03 DIAGNOSIS — F418 Other specified anxiety disorders: Secondary | ICD-10-CM

## 2023-08-05 ENCOUNTER — Encounter: Payer: Self-pay | Admitting: Internal Medicine

## 2023-08-05 ENCOUNTER — Other Ambulatory Visit: Payer: Self-pay

## 2023-08-05 DIAGNOSIS — F418 Other specified anxiety disorders: Secondary | ICD-10-CM

## 2023-08-05 DIAGNOSIS — E559 Vitamin D deficiency, unspecified: Secondary | ICD-10-CM

## 2023-08-05 MED ORDER — VITAMIN D (ERGOCALCIFEROL) 1.25 MG (50000 UNIT) PO CAPS
50000.0000 [IU] | ORAL_CAPSULE | ORAL | 0 refills | Status: DC
Start: 2023-08-05 — End: 2023-10-27

## 2023-08-05 MED ORDER — DULOXETINE HCL 60 MG PO CPEP: 60.0000 mg | ORAL_CAPSULE | Freq: Every day | ORAL | 0 refills | Status: DC

## 2023-08-13 ENCOUNTER — Other Ambulatory Visit: Payer: Self-pay | Admitting: Internal Medicine

## 2023-08-13 DIAGNOSIS — J4520 Mild intermittent asthma, uncomplicated: Secondary | ICD-10-CM

## 2023-08-14 ENCOUNTER — Other Ambulatory Visit: Payer: Self-pay | Admitting: Internal Medicine

## 2023-08-14 DIAGNOSIS — I10 Essential (primary) hypertension: Secondary | ICD-10-CM

## 2023-08-16 ENCOUNTER — Other Ambulatory Visit: Payer: Self-pay | Admitting: Internal Medicine

## 2023-08-18 ENCOUNTER — Ambulatory Visit: Payer: Self-pay | Admitting: Internal Medicine

## 2023-08-18 ENCOUNTER — Encounter: Payer: Self-pay | Admitting: Internal Medicine

## 2023-08-18 ENCOUNTER — Ambulatory Visit: Admitting: Internal Medicine

## 2023-08-18 ENCOUNTER — Ambulatory Visit

## 2023-08-18 VITALS — BP 132/68 | HR 67 | Temp 98.7°F | Resp 16 | Ht 63.0 in | Wt 253.4 lb

## 2023-08-18 DIAGNOSIS — I272 Pulmonary hypertension, unspecified: Secondary | ICD-10-CM | POA: Diagnosis not present

## 2023-08-18 DIAGNOSIS — J189 Pneumonia, unspecified organism: Secondary | ICD-10-CM

## 2023-08-18 DIAGNOSIS — I10 Essential (primary) hypertension: Secondary | ICD-10-CM

## 2023-08-18 DIAGNOSIS — D510 Vitamin B12 deficiency anemia due to intrinsic factor deficiency: Secondary | ICD-10-CM

## 2023-08-18 DIAGNOSIS — E538 Deficiency of other specified B group vitamins: Secondary | ICD-10-CM | POA: Diagnosis not present

## 2023-08-18 DIAGNOSIS — K2940 Chronic atrophic gastritis without bleeding: Secondary | ICD-10-CM | POA: Insufficient documentation

## 2023-08-18 LAB — CBC WITH DIFFERENTIAL/PLATELET
Basophils Absolute: 0 K/uL (ref 0.0–0.1)
Basophils Relative: 0.4 % (ref 0.0–3.0)
Eosinophils Absolute: 0.2 K/uL (ref 0.0–0.7)
Eosinophils Relative: 2.6 % (ref 0.0–5.0)
HCT: 37.8 % (ref 36.0–46.0)
Hemoglobin: 12.1 g/dL (ref 12.0–15.0)
Lymphocytes Relative: 27.5 % (ref 12.0–46.0)
Lymphs Abs: 1.9 K/uL (ref 0.7–4.0)
MCHC: 31.9 g/dL (ref 30.0–36.0)
MCV: 86.5 fl (ref 78.0–100.0)
Monocytes Absolute: 0.4 K/uL (ref 0.1–1.0)
Monocytes Relative: 6 % (ref 3.0–12.0)
Neutro Abs: 4.5 K/uL (ref 1.4–7.7)
Neutrophils Relative %: 63.5 % (ref 43.0–77.0)
Platelets: 318 K/uL (ref 150.0–400.0)
RBC: 4.37 Mil/uL (ref 3.87–5.11)
RDW: 15.6 % — ABNORMAL HIGH (ref 11.5–15.5)
WBC: 7.1 K/uL (ref 4.0–10.5)

## 2023-08-18 LAB — BASIC METABOLIC PANEL WITH GFR
BUN: 11 mg/dL (ref 6–23)
CO2: 28 meq/L (ref 19–32)
Calcium: 9 mg/dL (ref 8.4–10.5)
Chloride: 105 meq/L (ref 96–112)
Creatinine, Ser: 0.84 mg/dL (ref 0.40–1.20)
GFR: 71.65 mL/min (ref >60.00)
Glucose, Bld: 99 mg/dL (ref 70–99)
Potassium: 4.1 meq/L (ref 3.5–5.1)
Sodium: 140 meq/L (ref 135–145)

## 2023-08-18 MED ORDER — TRIAMTERENE-HCTZ 37.5-25 MG PO CAPS: 1.0000 | ORAL_CAPSULE | Freq: Every day | ORAL | 0 refills | Status: DC

## 2023-08-18 MED ORDER — CYANOCOBALAMIN 1000 MCG/ML IJ SOLN
1000.0000 ug | Freq: Once | INTRAMUSCULAR | Status: AC
Start: 2023-08-18 — End: 2023-08-18
  Administered 2023-08-18 (×2): 1000 ug via INTRAMUSCULAR

## 2023-08-18 NOTE — Progress Notes (Signed)
 "  Subjective:  Patient ID: [name], female    DOB: 04-Mar-1955  Age: 68 y.o. MRN: [id number]  CC: Medical Management of Chronic Issues (3-4 month check up xray recheck. ), Anemia, and Hypertension   HPI [name] presents for f/up -----  Discussed the use of AI scribe software for clinical note transcription with the patient, who gave verbal consent to proceed.  History of Present Illness [name] is a 68 year old female who presents with concerns about medication management and tingling in her fingers.  She recently underwent a CT scan of her chest, which indicated a concern for right lower lobe pneumonia. Despite this finding, she is asymptomatic, experiencing no fever, chills, night sweats, chest pain, or hemoptysis. She does have a mild cough, occurring approximately three times a day.  She has noticed swelling in her legs and feet, which has improved since her retirement. She is currently taking triamterene  and nebivolol  for blood pressure and edema management. She wants to change her medication regimen to a single combination pill, as she was previously advised to stop hydrochlorothiazide  due to bladder issues, which have since resolved.  She experiences persistent tingling at the tips of her fingers despite receiving a B12 injection in June and taking B12 and B1 supplements.  Her blood pressure is well controlled, with recent readings of 126/74 and 132/68. She has a history of anemia and pulmonary hypertension, although she has not seen a specialist for the latter.    Outpatient Medications Prior to Visit  Medication Sig Dispense Refill   acetaminophen  (TYLENOL ) 500 MG tablet Take 1,000 mg by mouth in the morning and at bedtime.     albuterol  (VENTOLIN  HFA) 108 (90 Base) MCG/ACT inhaler INHALE 1-2 PUFFS BY MOUTH EVERY 6 HOURS AS NEEDED FOR WHEEZE OR SHORTNESS OF BREATH 6.7 each 1   budesonide -formoterol  (SYMBICORT ) 80-4.5 MCG/ACT inhaler INHALE 2 PUFFS INTO THE LUNGS  TWICE A DAY 30.6 each 1   DULoxetine  (CYMBALTA ) 60 MG capsule Take 1 capsule (60 mg total) by mouth daily. 90 capsule 0   esomeprazole  (NEXIUM ) 40 MG capsule TAKE 1 CAPSULE (40 MG TOTAL) BY MOUTH DAILY AT 12 NOON. 90 capsule 0   estradiol (ESTRACE) 0.1 MG/GM vaginal cream PLEASE SEE ATTACHED FOR DETAILED DIRECTIONS     fluocinonide -emollient (LIDEX -E) 0.05 % cream APPLY TO AFFECTED AREA TWICE A DAY 60 g 1   fluticasone  (FLONASE ) 50 MCG/ACT nasal spray Place 2 sprays into both nostrils daily. 16 g 2   folic acid  (FOLVITE ) 1 MG tablet Take 1 tablet (1 mg total) by mouth daily. 90 tablet 1   iron  polysaccharides (NIFEREX) 150 MG capsule Take 150 mg by mouth daily.     levalbuterol  (XOPENEX  HFA) 45 MCG/ACT inhaler Inhale 2 puffs into the lungs every 6 (six) hours as needed for wheezing. 15 g 3   LINZESS  290 MCG CAPS capsule Take 1 capsule (290 mcg total) by mouth every morning. 30 capsule 5   Vitamin D , Ergocalciferol , (DRISDOL ) 1.25 MG (50000 UNIT) CAPS capsule Take 1 capsule (50,000 Units total) by mouth every 7 (seven) days. 12 capsule 0   nebivolol  (BYSTOLIC ) 5 MG tablet TAKE 1 TABLET (5 MG TOTAL) BY MOUTH DAILY. 90 tablet 0   triamterene  (DYRENIUM ) 50 MG capsule TAKE 1 CAPSULE BY MOUTH EVERY DAY 90 capsule 1   No facility-administered medications prior to visit.    ROS Review of Systems  Constitutional:  Negative for appetite change, chills, diaphoresis, fatigue and fever.  HENT:  Negative.    Eyes:  Negative for visual disturbance.  Respiratory:  Positive for apnea and shortness of breath. Negative for cough, choking and wheezing.   Cardiovascular:  Positive for leg swelling. Negative for chest pain and palpitations.  Gastrointestinal: Negative.  Negative for abdominal pain, constipation, diarrhea, nausea and vomiting.  Endocrine: Negative.   Genitourinary: Negative.  Negative for difficulty urinating and dysuria.  Musculoskeletal:  Positive for arthralgias. Negative for myalgias.  Skin:  Negative.   Neurological: Negative.  Negative for dizziness, weakness and light-headedness.  Hematological:  Negative for adenopathy. Does not bruise/bleed easily.  Psychiatric/Behavioral: Negative.      Objective:  BP 132/68 (BP Location: Left Arm, Patient Position: Sitting, Cuff Size: Normal)   Pulse 67   Temp 98.7 F (37.1 C) (Oral)   Resp 16   Ht 5' 3 (1.6 m)   Wt 253 lb 6.4 oz (114.9 kg)   SpO2 95%   BMI 44.89 kg/m   BP Readings from Last 3 Encounters:  08/18/23 132/68  07/13/23 133/78  06/26/23 (!) 154/78    Wt Readings from Last 3 Encounters:  08/18/23 253 lb 6.4 oz (114.9 kg)  06/26/23 255 lb (115.7 kg)  06/25/23 255 lb 9.6 oz (115.9 kg)    Physical Exam Vitals reviewed.  Constitutional:      Appearance: Normal appearance.  HENT:     Nose: Nose normal.     Mouth/Throat:     Mouth: Mucous membranes are moist.  Eyes:     General: No scleral icterus.    Conjunctiva/sclera: Conjunctivae normal.  Cardiovascular:     Rate and Rhythm: Normal rate and regular rhythm.     Heart sounds: No murmur heard.    No friction rub. No gallop.  Pulmonary:     Effort: Pulmonary effort is normal.     Breath sounds: No stridor. No wheezing, rhonchi or rales.  Abdominal:     General: Abdomen is flat.     Palpations: There is no mass.     Tenderness: There is no abdominal tenderness. There is no guarding.     Hernia: No hernia is present.  Musculoskeletal:        General: Normal range of motion.     Cervical back: Neck supple.     Right lower leg: No edema.     Left lower leg: No edema.  Lymphadenopathy:     Cervical: No cervical adenopathy.  Skin:    General: Skin is warm and dry.  Neurological:     General: No focal deficit present.     Mental Status: She is alert.  Psychiatric:        Mood and Affect: Mood normal.        Behavior: Behavior normal.     Lab Results  Component Value Date   WBC 7.1 08/18/2023   HGB 12.1 08/18/2023   HCT 37.8 08/18/2023   PLT  318.0 08/18/2023   GLUCOSE 99 08/18/2023   CHOL 181 11/19/2022   TRIG 81.0 11/19/2022   HDL 60.20 11/19/2022   LDLCALC 105 (H) 11/19/2022   ALT 17 05/25/2023   AST 18 05/25/2023   NA 140 08/18/2023   K 4.1 08/18/2023   CL 105 08/18/2023   CREATININE 0.84 08/18/2023   BUN 11 08/18/2023   CO2 28 08/18/2023   TSH 1.80 05/25/2023   HGBA1C 6.0 06/25/2023    CT CORONARY MORPH W/CTA COR W/SCORE W/CA W/CM &/OR WO/CM Addendum Date: 07/14/2023 ADDENDUM REPORT: 07/14/2023 15:51 CLINICAL DATA:  68 yo female with chest pain/anginal equiv, high CAD risk, not treadmill candidate EXAM: Cardiac/Coronary CTA TECHNIQUE: A non-contrast, gated CT scan was obtained with axial slices of 2.5 mm through the heart for calcium  scoring. Calcium  scoring was performed using the Agatston method. A 120 kV prospective, gated, contrast cardiac CT scan was obtained. Gantry rotation speed was 230 msec and collimation was 0.63 mm. Two sublingual nitroglycerin  tablets (0.8 mg) were given. The 3D data set was reconstructed with motion correction for the best systolic or diastolic phase. Images were analyzed on a dedicated workstation using MPR, MIP, and VRT modes. The patient received OMNIPAQUE  IOHEXOL  350 MG/ML SOLN of contrast. FINDINGS: Image quality: Excellent. Noise artifact is: Limited. Coronary arteries: Normal coronary origins.  Right dominance. Right Coronary Artery: Dominant. No disease. Normal R-PLB and R-PDA branches. Left Main Coronary Artery: Normal. Bifurcates into the LAD and LCx arteries. Left Anterior Descending Coronary Artery: Large anterior artery that wraps around the apex. No disease. 2 small high diagonal branches (<2 mm) without significant stenosis. Left Circumflex Artery: AV groove vessel, no disease. Aorta: Normal size, 31 mm at the mid ascending aorta (level of the PA bifurcation) measured double oblique. No calcifications. No dissection. Aortic Valve: Trileaflet. No calcifications. Other findings:  Normal pulmonary vein drainage into the left atrium. Normal left atrial appendage without a thrombus. Dilated main pulmonary artery to 35 mm, possibly suggestive of pulmonary hypertension. Extra-cardiac findings: See attached radiology report for non-cardiac structures. IMPRESSION: 1. No evidence of CAD, CADRADS = 0. 2. Normal coronary origin with right dominance. 3. Coronary artery calcium  score is 0. 4. Dilated main pulmonary artery to 35 mm, possibly suggestive of pulmonary hypertension. 5. Consider non-coronary causes of chest pain. RECOMMENDATIONS: 1. CAD-RADS 0: No evidence of CAD (0%). Consider non-atherosclerotic causes of chest pain. 2. CAD-RADS 1: Minimal non-obstructive CAD (0-24%). Consider non-atherosclerotic causes of chest pain. Consider preventive therapy and risk factor modification. 3. CAD-RADS 2: Mild non-obstructive CAD (25-49%). Consider non-atherosclerotic causes of chest pain. Consider preventive therapy and risk factor modification. 4. CAD-RADS 3: Moderate stenosis. Consider symptom-guided anti-ischemic pharmacotherapy as well as risk factor modification per guideline directed care. Additional analysis with CT FFR will be submitted. 5. CAD-RADS 4: Severe stenosis. (70-99% or > 50% left main). Cardiac catheterization or CT FFR is recommended. Consider symptom-guided anti-ischemic pharmacotherapy as well as risk factor modification per guideline directed care. Invasive coronary angiography recommended with revascularization per published guideline statements. 6. CAD-RADS 5: Total coronary occlusion (100%). Consider cardiac catheterization or viability assessment. Consider symptom-guided anti-ischemic pharmacotherapy as well as risk factor modification per guideline directed care. 7. CAD-RADS N: Non-diagnostic study. Obstructive CAD can't be excluded. Alternative evaluation is recommended. Electronically Signed   By: Vinie JAYSON Maxcy M.D.   On: 07/14/2023 15:51   Addendum Date:  07/14/2023 ADDENDUM REPORT: 07/14/2023 15:51 CLINICAL DATA:  68 yo female with chest pain/anginal equiv, high CAD risk, not treadmill candidate EXAM: Cardiac/Coronary CTA TECHNIQUE: A non-contrast, gated CT scan was obtained with axial slices of 2.5 mm through the heart for calcium  scoring. Calcium  scoring was performed using the Agatston method. A 120 kV prospective, gated, contrast cardiac CT scan was obtained. Gantry rotation speed was 230 msec and collimation was 0.63 mm. Two sublingual nitroglycerin  tablets (0.8 mg) were given. The 3D data set was reconstructed with motion correction for the best systolic or diastolic phase. Images were analyzed on a dedicated workstation using MPR, MIP, and VRT modes. The patient received 100mL OMNIPAQUE  IOHEXOL  350 MG/ML SOLN of  contrast. FINDINGS: Image quality: Excellent. Noise artifact is: Limited. Coronary arteries: Normal coronary origins.  Right dominance. Right Coronary Artery: Dominant. No disease. Normal R-PLB and R-PDA branches. Left Main Coronary Artery: Normal. Bifurcates into the LAD and LCx arteries. Left Anterior Descending Coronary Artery: Large anterior artery that wraps around the apex. No disease. 2 small high diagonal branches (<2 mm) without significant stenosis. Left Circumflex Artery: AV groove vessel, no disease. Aorta: Normal size, 31 mm at the mid ascending aorta (level of the PA bifurcation) measured double oblique. No calcifications. No dissection. Aortic Valve: Trileaflet. No calcifications. Other findings: Normal pulmonary vein drainage into the left atrium. Normal left atrial appendage without a thrombus. Dilated main pulmonary artery to 35 mm, possibly suggestive of pulmonary hypertension. Extra-cardiac findings: See attached radiology report for non-cardiac structures. IMPRESSION: 1. No evidence of CAD, CADRADS = 0. 2. Normal coronary origin with right dominance. 3. Coronary artery calcium  score is 0. 4. Dilated main pulmonary artery to 35 mm,  possibly suggestive of pulmonary hypertension. 5. Consider non-coronary causes of chest pain. RECOMMENDATIONS: 1. CAD-RADS 0: No evidence of CAD (0%). Consider non-atherosclerotic causes of chest pain. 2. CAD-RADS 1: Minimal non-obstructive CAD (0-24%). Consider non-atherosclerotic causes of chest pain. Consider preventive therapy and risk factor modification. 3. CAD-RADS 2: Mild non-obstructive CAD (25-49%). Consider non-atherosclerotic causes of chest pain. Consider preventive therapy and risk factor modification. 4. CAD-RADS 3: Moderate stenosis. Consider symptom-guided anti-ischemic pharmacotherapy as well as risk factor modification per guideline directed care. Additional analysis with CT FFR will be submitted. 5. CAD-RADS 4: Severe stenosis. (70-99% or > 50% left main). Cardiac catheterization or CT FFR is recommended. Consider symptom-guided anti-ischemic pharmacotherapy as well as risk factor modification per guideline directed care. Invasive coronary angiography recommended with revascularization per published guideline statements. 6. CAD-RADS 5: Total coronary occlusion (100%). Consider cardiac catheterization or viability assessment. Consider symptom-guided anti-ischemic pharmacotherapy as well as risk factor modification per guideline directed care. 7. CAD-RADS N: Non-diagnostic study. Obstructive CAD can't be excluded. Alternative evaluation is recommended. Electronically Signed   By: Vinie JAYSON Maxcy M.D.   On: 07/14/2023 15:51   Result Date: 07/14/2023 EXAM: OVER-READ INTERPRETATION  CT CHEST The following report is a limited chest CT over-read performed by radiologist Dr. Kate Plummer of [facility] on 07/13/2023. This over-read does not include interpretation of cardiac or coronary anatomy or pathology. The calcium  score CT interpretation by the cardiologist is attached. COMPARISON:  CT chest 09/06/2021, CT chest 10/19/2017 FINDINGS: Stable poorly visualized couple of right lower lobe 8  mm pulmonary nodules. Associated overlying patchy airspace opacities of the right lower lobe. Atelectasis of the right middle lobe and lingula. Chronic stable pulmonary nodules within the left lower lobe and right middle lobe-no further follow-up indicated. No lymphadenopathy. Gastric lap band appropriately position with associated tiny hiatal hernia. No acute osseous abnormality. Multilevel degenerative changes of the spine. No acute soft tissue abnormality. IMPRESSION: 1. Patchy airspace opacity of the right lower lobe may represent atelectasis versus infection. Underlying grossly stable 8 mm chronic right lower lobe pulmonary nodules not well visualized. No further follow-up indicated of the pulmonary nodules due to chronicity. Followup PA and lateral chest X-ray is recommended in 3-4 weeks to ensure resolution of patchy airspace opacity. 2. Gastric lap band appropriately position with associated tiny hiatal hernia. Electronically Signed: By: Morgane  Naveau M.D. On: 07/13/2023 14:16   ECHOCARDIOGRAM COMPLETE Result Date: 07/13/2023    ECHOCARDIOGRAM REPORT   Patient Name:   [name] Date of  Exam: 07/13/2023 Medical Rec #:  [id number]     Height:       63.0 in Accession #:    [id number]    Weight:       255.0 lb Date of Birth:  11-Aug-1955      BSA:          2.144 m Patient Age:    67 years      BP:           166/94 mmHg Patient Gender: F             HR:           65 bpm. Exam Location:  [address] Procedure: 2D Echo, 3D Echo, Cardiac Doppler, Color Doppler and Strain Analysis            (Both Spectral and Color Flow Doppler were utilized during            procedure). Indications:    R06.00 Dyspnea  History:        Patient has prior history of Echocardiogram examinations, most                 recent 07/31/2022. Abnormal ECG, CKD; Risk Factors:Sleep Apnea,                 HLD and Hypertension.  Sonographer:    Waldo Guadalajara RCS Referring Phys: [id number] NIKI FALCON JOYCE IMPRESSIONS  1. Left ventricular ejection  fraction, by estimation, is 55 to 60%. Left ventricular ejection fraction by 3D volume is 56 %. The left ventricle has normal function. The left ventricle has no regional wall motion abnormalities. Left ventricular diastolic  parameters are indeterminate. The average left ventricular global longitudinal strain is -19.5 %. The global longitudinal strain is normal.  2. Right ventricular systolic function is normal. The right ventricular size is normal. There is normal pulmonary artery systolic pressure.  3. The mitral valve is normal in structure. Trivial mitral valve regurgitation. No evidence of mitral stenosis.  4. The aortic valve is tricuspid. Aortic valve regurgitation is not visualized. No aortic stenosis is present.  5. The inferior vena cava is normal in size with greater than 50% respiratory variability, suggesting right atrial pressure of 3 mmHg. FINDINGS  Left Ventricle: Left ventricular ejection fraction, by estimation, is 55 to 60%. Left ventricular ejection fraction by 3D volume is 56 %. The left ventricle has normal function. The left ventricle has no regional wall motion abnormalities. The average left ventricular global longitudinal strain is -19.5 %. Strain was performed and the global longitudinal strain is normal. The left ventricular internal cavity size was normal in size. There is no left ventricular hypertrophy. Left ventricular diastolic parameters are indeterminate. Right Ventricle: The right ventricular size is normal. No increase in right ventricular wall thickness. Right ventricular systolic function is normal. There is normal pulmonary artery systolic pressure. The tricuspid regurgitant velocity is 2.42 m/s, and  with an assumed right atrial pressure of 3 mmHg, the estimated right ventricular systolic pressure is 26.4 mmHg. Left Atrium: Left atrial size was normal in size. Right Atrium: Right atrial size was normal in size. Pericardium: There is no evidence of pericardial effusion.  Mitral Valve: The mitral valve is normal in structure. Trivial mitral valve regurgitation. No evidence of mitral valve stenosis. Tricuspid Valve: The tricuspid valve is normal in structure. Tricuspid valve regurgitation is mild . No evidence of tricuspid stenosis. Aortic Valve: The aortic valve is tricuspid. Aortic valve regurgitation is not visualized. No  aortic stenosis is present. Pulmonic Valve: The pulmonic valve was normal in structure. Pulmonic valve regurgitation is trivial. No evidence of pulmonic stenosis. Aorta: The aortic root is normal in size and structure. Venous: The inferior vena cava is normal in size with greater than 50% respiratory variability, suggesting right atrial pressure of 3 mmHg. IAS/Shunts: No atrial level shunt detected by color flow Doppler. Additional Comments: 3D was performed not requiring image post processing on an independent workstation and was normal.  LEFT VENTRICLE PLAX 2D LVIDd:         4.80 cm         Diastology LVIDs:         3.30 cm         LV e' medial:    5.98 cm/s LV PW:         1.20 cm         LV E/e' medial:  9.7 LV IVS:        0.90 cm         LV e' lateral:   7.62 cm/s LVOT diam:     2.00 cm         LV E/e' lateral: 7.6 LV SV:         61 LV SV Index:   28              2D Longitudinal LVOT Area:     3.14 cm        Strain                                2D Strain GLS   -19.5 %                                Avg:                                 3D Volume EF                                LV 3D EF:    Left                                             ventricul                                             ar                                             ejection                                             fraction  by 3D                                             volume is                                             56 %.                                 3D Volume EF:                                3D EF:        56 %                                 LV EDV:       120 ml                                LV ESV:       53 ml                                LV SV:        67 ml RIGHT VENTRICLE RV Basal diam:  2.50 cm RV S prime:     13.80 cm/s TAPSE (M-mode): 2.3 cm RVSP:           26.4 mmHg LEFT ATRIUM             Index        RIGHT ATRIUM           Index LA diam:        3.70 cm 1.73 cm/m   RA Pressure: 3.00 mmHg LA Vol (A2C):   56.4 ml 26.31 ml/m  RA Area:     10.40 cm LA Vol (A4C):   41.7 ml 19.45 ml/m  RA Volume:   22.40 ml  10.45 ml/m LA Biplane Vol: 48.8 ml 22.76 ml/m  AORTIC VALVE LVOT Vmax:   87.50 cm/s LVOT Vmean:  58.700 cm/s LVOT VTI:    0.193 m  AORTA Ao Root diam: 2.90 cm Ao Asc diam:  3.40 cm MITRAL VALVE               TRICUSPID VALVE MV Area (PHT):             TR Peak grad:   23.4 mmHg MV Decel Time:             TR Vmax:        242.00 cm/s MV E velocity: 58.20 cm/s  Estimated RAP:  3.00 mmHg MV A velocity: 70.20 cm/s  RVSP:           26.4 mmHg MV E/A ratio:  0.83                            SHUNTS  Systemic VTI:  0.19 m                            Systemic Diam: 2.00 cm Morene Brownie Electronically signed by Morene Brownie Signature Date/Time: 07/13/2023/8:40:20 AM    Final        Assessment & Plan:   Pneumonia of right lower lobe due to infectious organism - This has resolved. -     DG Chest 2 View; Future  Vitamin B12 deficiency anemia due to intrinsic factor deficiency -     CBC with Differential/Platelet; Future -     Cyanocobalamin   Folate deficiency -     CBC with Differential/Platelet; Future  Essential hypertension- BP is well controlled. -     Basic metabolic panel with GFR; Future -     Triamterene -HCTZ; Take 1 each (1 capsule total) by mouth daily.  Dispense: 90 each; Refill: 0  Pulmonary hypertension (HCC) -     Pulmonary Visit     Follow-up: Return in about 3 months (around 11/18/2023).  Debby Molt, MD "

## 2023-09-01 ENCOUNTER — Encounter: Payer: Self-pay | Admitting: Internal Medicine

## 2023-09-04 ENCOUNTER — Other Ambulatory Visit: Payer: Self-pay

## 2023-09-04 DIAGNOSIS — K5904 Chronic idiopathic constipation: Secondary | ICD-10-CM

## 2023-09-04 MED ORDER — LINZESS 290 MCG PO CAPS: 290.0000 ug | ORAL_CAPSULE | Freq: Every morning | ORAL | 5 refills | Status: DC

## 2023-09-09 ENCOUNTER — Ambulatory Visit (INDEPENDENT_AMBULATORY_CARE_PROVIDER_SITE_OTHER)

## 2023-09-09 VITALS — Ht 63.0 in | Wt 253.0 lb

## 2023-09-09 DIAGNOSIS — Z0000 Encounter for general adult medical examination without abnormal findings: Secondary | ICD-10-CM

## 2023-09-09 NOTE — Progress Notes (Signed)
 Subjective:   [name] is a 68 y.o. who presents for a Medicare Wellness preventive visit.  As a reminder, Annual Wellness Visits don't include a physical exam, and some assessments may be limited, especially if this visit is performed virtually. We may recommend an in-person follow-up visit with your provider if needed.  Visit Complete: Virtual I connected with  [name] on 09/09/23 by a audio enabled telemedicine application and verified that I am speaking with the correct person using two identifiers.  Patient Location: Home  Provider Location: Home Office  I discussed the limitations of evaluation and management by telemedicine. The patient expressed understanding and agreed to proceed.  Vital Signs: Because this visit was a virtual/telehealth visit, some criteria may be missing or patient reported. Any vitals not documented were not able to be obtained and vitals that have been documented are patient reported.  VideoDeclined- This patient declined Librarian, academic. Therefore the visit was completed with audio only.  Persons Participating in Visit: Patient.  AWV Questionnaire: No: Patient Medicare AWV questionnaire was not completed prior to this visit.  Cardiac Risk Factors include: hypertension;advanced age (>34men, >20 women);Other (see comment), Risk factor comments: OSA, CKD stage 2     Objective:    Today's Vitals   09/09/23 0937  Weight: 253 lb (114.8 kg)  Height: 5' 3 (1.6 m)   Body mass index is 44.82 kg/m.     09/09/2023    9:58 AM 06/26/2023    6:46 AM 06/25/2023   10:11 AM 01/22/2023    8:21 AM 07/13/2019    9:22 PM 12/21/2015    9:28 AM 12/17/2015    2:26 PM  Advanced Directives  Does Patient Have a Medical Advance Directive? Yes No Yes Yes No Yes  Yes   Type of Advance Directive Living will  Living will;Healthcare Power of Attorney   Living will Living will  Does patient want to make changes to medical advance  directive?    No - Patient declined     Copy of Healthcare Power of Attorney in Chart? No - copy requested  Yes - validated most recent copy scanned in chart (See row information)      Would patient like information on creating a medical advance directive?  No - Patient declined          Data saved with a previous flowsheet row definition    Current Medications (verified) Outpatient Encounter Medications as of 09/09/2023  Medication Sig   acetaminophen  (TYLENOL ) 500 MG tablet Take 1,000 mg by mouth in the morning and at bedtime.   albuterol  (VENTOLIN  HFA) 108 (90 Base) MCG/ACT inhaler INHALE 1-2 PUFFS BY MOUTH EVERY 6 HOURS AS NEEDED FOR WHEEZE OR SHORTNESS OF BREATH   budesonide -formoterol  (SYMBICORT ) 80-4.5 MCG/ACT inhaler INHALE 2 PUFFS INTO THE LUNGS TWICE A DAY   DULoxetine  (CYMBALTA ) 60 MG capsule Take 1 capsule (60 mg total) by mouth daily.   esomeprazole  (NEXIUM ) 40 MG capsule TAKE 1 CAPSULE (40 MG TOTAL) BY MOUTH DAILY AT 12 NOON.   estradiol (ESTRACE) 0.1 MG/GM vaginal cream PLEASE SEE ATTACHED FOR DETAILED DIRECTIONS   fluocinonide -emollient (LIDEX -E) 0.05 % cream APPLY TO AFFECTED AREA TWICE A DAY   fluticasone  (FLONASE ) 50 MCG/ACT nasal spray Place 2 sprays into both nostrils daily.   folic acid  (FOLVITE ) 1 MG tablet Take 1 tablet (1 mg total) by mouth daily.   iron  polysaccharides (NIFEREX) 150 MG capsule Take 150 mg by mouth daily.   levalbuterol  (XOPENEX   HFA) 45 MCG/ACT inhaler Inhale 2 puffs into the lungs every 6 (six) hours as needed for wheezing.   LINZESS  290 MCG CAPS capsule Take 1 capsule (290 mcg total) by mouth every morning.   triamterene -hydrochlorothiazide  (DYAZIDE ) 37.5-25 MG capsule Take 1 each (1 capsule total) by mouth daily.   Vitamin D , Ergocalciferol , (DRISDOL ) 1.25 MG (50000 UNIT) CAPS capsule Take 1 capsule (50,000 Units total) by mouth every 7 (seven) days.   [DISCONTINUED] promethazine  (PHENERGAN ) 25 MG tablet Take 1 tablet (25 mg total) by mouth every 6  (six) hours as needed for nausea or vomiting.   No facility-administered encounter medications on file as of 09/09/2023.    Allergies (verified) Fastin  [phentermine ], Erythromycin, and Lisinopril   History: Past Medical History:  Diagnosis Date   Anemia    Anxiety disorder    Asthma    Chronic cough    Complication of anesthesia    Had ERCP in 2006 and led to respiratory failure, caused pancreatits, was in coma for 30days.   Depression    GERD (gastroesophageal reflux disease)    History of ERCP    History of pancreatitis    History of tracheostomy    HTN (hypertension)    Hyperglycemia    Hypokalemia    Pneumonia    Pre-diabetes    Respiratory failure (HCC)    Sleep apnea    CPAP   Past Surgical History:  Procedure Laterality Date   ABDOMINAL HYSTERECTOMY     Total Hysterectomy    APPENDECTOMY     BOTOX  INJECTION N/A 01/22/2023   Procedure: BOTOX  INJECTION 100 UNITS;  Surgeon: Elisabeth Valli BIRCH, MD;  Location: [facility] [place];  Service: Urology;  Laterality: N/A;   BREAST REDUCTION SURGERY     CHOLECYSTECTOMY     COLONOSCOPY N/A 06/26/2023   Procedure: COLONOSCOPY;  Surgeon: Rollin Dover, MD;  Location: [facility];  Service: Gastroenterology;  Laterality: N/A;   COLONOSCOPY WITH PROPOFOL  N/A 12/21/2015   Procedure: COLONOSCOPY WITH PROPOFOL ;  Surgeon: Dover Rollin, MD;  Location: [facility];  Service: Endoscopy;  Laterality: N/A;   CYSTOSCOPY WITH BIOPSY N/A 01/22/2023   Procedure: CYSTOSCOPY;  Surgeon: Elisabeth Valli BIRCH, MD;  Location: [facility];  Service: Urology;  Laterality: N/A;  60 MINUTE CASE   ERCP     ESOPHAGOGASTRODUODENOSCOPY N/A 12/21/2015   Procedure: ESOPHAGOGASTRODUODENOSCOPY (EGD);  Surgeon: Dover Rollin, MD;  Location: [facility];  Service: Endoscopy;  Laterality: N/A;   ESOPHAGOGASTRODUODENOSCOPY N/A 06/26/2023   Procedure: EGD (ESOPHAGOGASTRODUODENOSCOPY);  Surgeon: Rollin Dover, MD;  Location: [facility];  Service:  Gastroenterology;  Laterality: N/A;   GIVENS CAPSULE STUDY N/A 01/29/2016   Procedure: GIVENS CAPSULE STUDY;  Surgeon: Renaye Sous, MD;  Location[facility];  Service: Endoscopy;  Laterality: N/A;   HAMMER TOE SURGERY     INTESTINAL MALROTATION REPAIR  1999   TUBAL LIGATION     Family History  Problem Relation Age of Onset   Diabetes Other    Hypertension Other    Social History   Socioeconomic History   Marital status: Married    Spouse name: Aida   Number of children: Not on file   Years of education: Not on file   Highest education level: Master's degree (e.g., MA, MS, MEng, MEd, MSW, MBA)  Occupational History   Occupation: Chief Operating Officer: [employer]  Tobacco Use   Smoking status: Never   Smokeless tobacco: Never  Vaping Use   Vaping status: Never  Used  Substance and Sexual Activity   Alcohol use: No   Drug use: Never   Sexual activity: Not Currently    Birth control/protection: Surgical  Other Topics Concern   Not on file  Social History Narrative   Regular Exercise -  NO   Lives with husband/2025   Social Drivers of Health   Financial Resource Strain: Low Risk  (02/15/2023)   Overall Financial Resource Strain (CARDIA)    Difficulty of Paying Living Expenses: Not hard at all  Food Insecurity: No Food Insecurity (02/15/2023)   Hunger Vital Sign    Worried About Running Out of Food in the Last Year: Never true    Ran Out of Food in the Last Year: Never true  Transportation Needs: No Transportation Needs (09/09/2023)   PRAPARE - Administrator, Civil Service (Medical): No    Lack of Transportation (Non-Medical): No  Physical Activity: Inactive (09/09/2023)   Exercise Vital Sign    Days of Exercise per Week: 0 days    Minutes of Exercise per Session: 0 min  Stress: No Stress Concern Present (09/09/2023)   [employer] of Occupational Health - Occupational Stress Questionnaire    Feeling of Stress: Not at all  Social  Connections: Moderately Isolated (09/09/2023)   Social Connection and Isolation Panel    Frequency of Communication with Friends and Family: More than three times a week    Frequency of Social Gatherings with Friends and Family: Once a week    Attends Religious Services: Never    Database administrator or Organizations: No    Attends Engineer, structural: Never    Marital Status: Married    Tobacco Counseling Counseling given: Not Answered    Clinical Intake:  Pre-visit preparation completed: Yes  Pain : No/denies pain     BMI - recorded: 44.82 Nutritional Status: BMI > 30  Obese Nutritional Risks: None Diabetes: No  Lab Results  Component Value Date   HGBA1C 6.0 06/25/2023   HGBA1C 6.1 02/19/2023   HGBA1C 5.8 05/19/2022     How often do you need to have someone help you when you read instructions, pamphlets, or other written materials from your doctor or pharmacy?: 1 - Never  Interpreter Needed?: No  Information entered by :: Zakaria Fromer, RMA   Activities of Daily Living     09/09/2023    9:38 AM 01/22/2023    8:27 AM  In your present state of health, do you have any difficulty performing the following activities:  Hearing? 0 0  Vision? 0 0  Difficulty concentrating or making decisions? 0 0  Walking or climbing stairs? 0   Dressing or bathing? 0   Doing errands, shopping? 0   Preparing Food and eating ? N   Using the Toilet? N   In the past six months, have you accidently leaked urine? Y   Do you have problems with loss of bowel control? N   Managing your Medications? N   Managing your Finances? N   Housekeeping or managing your Housekeeping? N     Patient Care Team: Joshua Debby CROME, MD as PCP - General  I have updated your Care Teams any recent Medical Services you may have received from other providers in the past year.     Assessment:   This is a routine wellness examination for [name].  Hearing/Vision screen Hearing Screening -  Comments:: Denies hearing difficulties   Vision Screening - Comments:: Wears eyeglasses for  reading/[facility]   Goals Addressed               This Visit's Progress     Patient Stated (pt-stated)        Would like to get healthy to get off CPAp/2025       Depression Screen     09/09/2023   10:00 AM 08/18/2023    9:41 AM 05/25/2023    2:12 PM 02/19/2023    8:02 AM 11/19/2022    8:08 AM 05/19/2022    2:29 PM 05/21/2021    8:10 AM  PHQ 2/9 Scores  PHQ - 2 Score 0 1 0 0 0 0 1  PHQ- 9 Score 0 5 11  0 0 10    Fall Risk     09/09/2023    9:59 AM 08/18/2023    9:41 AM 05/25/2023    2:11 PM 02/19/2023    8:01 AM 11/19/2022    8:07 AM  Fall Risk   Falls in the past year? 0 0 0 0 0  Number falls in past yr: 0 0  0 0  Injury with Fall? 0 0  0 0  Risk for fall due to :  No Fall Risks  No Fall Risks No Fall Risks  Follow up Falls evaluation completed;Falls prevention discussed Falls evaluation completed Falls evaluation completed Falls evaluation completed Falls evaluation completed    MEDICARE RISK AT HOME:  Medicare Risk at Home Any stairs in or around the home?: No If so, are there any without handrails?: No Home free of loose throw rugs in walkways, pet beds, electrical cords, etc?: Yes Adequate lighting in your home to reduce risk of falls?: Yes Life alert?: No Use of a cane, walker or w/c?: No Grab bars in the bathroom?: No Shower chair or bench in shower?: No Elevated toilet seat or a handicapped toilet?: No  TIMED UP AND GO:  Was the test performed?  No  Cognitive Function: Declined/Normal: No cognitive concerns noted by patient or family. Patient alert, oriented, able to answer questions appropriately and recall recent events. No signs of memory loss or confusion.        Immunizations Immunization History  Administered Date(s) Administered   Fluad Trivalent(High Dose 65+) 11/19/2022   INFLUENZA, HIGH DOSE SEASONAL PF 10/31/2021   Influenza Split  09/25/2011, 10/14/2013   Influenza Whole 10/18/2009   Influenza,inj,Quad PF,6+ Mos 10/28/2012, 10/11/2013, 10/13/2017, 10/02/2018, 11/15/2019   Influenza-Unspecified 10/07/2015, 10/05/2018, 11/12/2020   PFIZER(Purple Top)SARS-COV-2 Vaccination 04/16/2019, 05/15/2019, 11/19/2019, 04/23/2020   Pfizer Covid-19 Vaccine Bivalent Booster 94yrs & up 11/12/2020   Pfizer(Comirnaty)Fall Seasonal Vaccine 12 years and older 10/31/2021   Pneumococcal Conjugate-13 10/23/2015   Pneumococcal Polysaccharide-23 01/06/2006, 10/28/2012, 11/15/2019   Respiratory Syncytial Virus Vaccine,Recomb Aduvanted(Arexvy) 10/31/2021   Td 03/20/2009   Tdap 02/10/2019   Zoster Recombinant(Shingrix ) 05/21/2021    Screening Tests Health Maintenance  Topic Date Due   Medicare Annual Wellness (AWV)  Never done   Zoster Vaccines- Shingrix  (2 of 2) 07/16/2021   INFLUENZA VACCINE  08/07/2023   COVID-19 Vaccine (7 - 2025-26 season) 09/07/2023   MAMMOGRAM  01/03/2024   Pneumococcal Vaccine: 50+ Years (3 of 3 - PCV20 or PCV21) 11/14/2024   DTaP/Tdap/Td (3 - Td or Tdap) 02/09/2029   Colonoscopy  06/25/2033   DEXA SCAN  Completed   Hepatitis C Screening  Completed   HPV VACCINES  Aged Out   Meningococcal B Vaccine  Aged Out    Health Maintenance  Health Maintenance  Due  Topic Date Due   Medicare Annual Wellness (AWV)  Never done   Zoster Vaccines- Shingrix  (2 of 2) 07/16/2021   INFLUENZA VACCINE  08/07/2023   COVID-19 Vaccine (7 - 2025-26 season) 09/07/2023   Health Maintenance Items Addressed: Mammogram scheduled, See Nurse Notes at the end of this note  Additional Screening:  Vision Screening: Recommended annual ophthalmology exams for early detection of glaucoma and other disorders of the eye. Would you like a referral to an eye doctor? No    Dental Screening: Recommended annual dental exams for proper oral hygiene  Community Resource Referral / Chronic Care Management: CRR required this visit?  No   CCM  required this visit?  No   Plan:    I have personally reviewed and noted the following in the patient's chart:   Medical and social history Use of alcohol, tobacco or illicit drugs  Current medications and supplements including opioid prescriptions. Patient is not currently taking opioid prescriptions. Functional ability and status Nutritional status Physical activity Advanced directives List of other physicians Hospitalizations, surgeries, and ER visits in previous 12 months Vitals Screenings to include cognitive, depression, and falls Referrals and appointments  In addition, I have reviewed and discussed with patient certain preventive protocols, quality metrics, and best practice recommendations. A written personalized care plan for preventive services as well as general preventive health recommendations were provided to patient.   Gilad Dugger L Wyman Meschke, CMA   09/09/2023   After Visit Summary: (MyChart) Due to this being a telephonic visit, the after visit summary with patients personalized plan was offered to patient via MyChart   Notes: Patient is due for a 2nd Shingrix  vaccine and a Flu vaccine.  She had no concerns to address today.

## 2023-09-09 NOTE — Patient Instructions (Signed)
 [name] , Thank you for taking time out of your busy schedule to complete your Annual Wellness Visit with me. I enjoyed our conversation and look forward to speaking with you again next year. I, as well as your care team,  appreciate your ongoing commitment to your health goals. Please review the following plan we discussed and let me know if I can assist you in the future. Your Game plan/ To Do List    Follow up Visits: We will see or speak with you next year for your Next Medicare AWV with our clinical staff Have you seen your provider in the last 6 months (3 months if uncontrolled diabetes)? Yes.  Last office visit on 08/18/2023.  Next office visit on 11/18/2023.  Clinician Recommendations:  Aim for 30 minutes of exercise or brisk walking, 6-8 glasses of water , and 5 servings of fruits and vegetables each day. You are due for a 2nd Shingles vaccine and a Flu vaccine.  You can get these done at your pharmacy.        This is a list of the screenings recommended for you:  Health Maintenance  Topic Date Due   Medicare Annual Wellness Visit  Never done   Zoster (Shingles) Vaccine (2 of 2) 07/16/2021   Flu Shot  08/07/2023   COVID-19 Vaccine (7 - 2025-26 season) 09/07/2023   Mammogram  01/03/2024   Pneumococcal Vaccine for age over 35 (3 of 3 - PCV20 or PCV21) 11/14/2024   DTaP/Tdap/Td vaccine (3 - Td or Tdap) 02/09/2029   Colon Cancer Screening  06/25/2033   DEXA scan (bone density measurement)  Completed   Hepatitis C Screening  Completed   HPV Vaccine  Aged Out   Meningitis B Vaccine  Aged Out    Advanced directives: (Copy Requested) Please bring a copy of your health care power of attorney and living will to the office to be added to your chart at your convenience. You can mail to [facility] [address] [place], [place] [address] or email to [email or web address] Advance Care Planning is important because it:  [x]  Makes sure you receive the medical care  that is consistent with your values, goals, and preferences  [x]  It provides guidance to your family and loved ones and reduces their decisional burden about whether or not they are making the right decisions based on your wishes.  Follow the link provided in your after visit summary or read over the paperwork we have mailed to you to help you started getting your Advance Directives in place. If you need assistance in completing these, please reach out to us  so that we can help you!  See attachments for Preventive Care and Fall Prevention Tips.

## 2023-09-15 ENCOUNTER — Other Ambulatory Visit: Payer: Self-pay | Admitting: Internal Medicine

## 2023-09-15 DIAGNOSIS — E611 Iron deficiency: Secondary | ICD-10-CM

## 2023-09-28 ENCOUNTER — Ambulatory Visit: Admitting: Internal Medicine

## 2023-09-29 ENCOUNTER — Ambulatory Visit

## 2023-09-29 VITALS — BP 138/80 | HR 94 | Temp 98.9°F | Ht 63.0 in | Wt 251.6 lb

## 2023-09-29 DIAGNOSIS — R918 Other nonspecific abnormal finding of lung field: Secondary | ICD-10-CM | POA: Diagnosis not present

## 2023-09-29 DIAGNOSIS — G4733 Obstructive sleep apnea (adult) (pediatric): Secondary | ICD-10-CM | POA: Diagnosis not present

## 2023-09-29 DIAGNOSIS — I288 Other diseases of pulmonary vessels: Secondary | ICD-10-CM

## 2023-09-29 NOTE — Progress Notes (Signed)
 New Patient Pulmonology Office Visit   Subjective:  Patient ID: [name], female    DOB: [birth year]  MRN: [id number]  Referred by: Joshua Debby CROME, MD  CC:  Chief Complaint  Patient presents with   Consult    Referred for pulmonary hypertension.  Also states she has occasional SOB with exertion.     HPI [name] is a 68 y.o. female non smoker,  with hypertension, allergic rhinitis, mild intermittent asthma, OSA on CPAP, GERD, bariatric surgery, morbid obesity with BMI 45, depression presents for evaluation of pulm hypertension. Hx of tracheostomy.  Follows Dr Harden Staff from our practice for Sleep.   Chronic cough since Aug 2024 after a choking episode with a nut. Oct 2024 treated for PNA with abx. Was taken off ARB. Now the cough is better. Intermittent phlegm with cough.  No SOB at baseline. ET 2 block. No wheezing. No orthopnea or leg swelling.  On symbicort  and albuterol . Has not used albuterol  for a long time.   Also has OSA. Compliance report shows optimal use w residual AHI of <2.    Echo 07/2023: EF 50-60%, RV function normal, normal PASP. Cardiac CT done 07/2023: Reviewed by me: Aorta 3.8 cm, Pulm Art 3.6 cm.  No concern for PAH.  Ground glass changes in right lower lobe and left lower lobe right greater than left.  Could be atelectasis versus infiltrate. The prior nodules not seen.  Chest x-ray August 2025: Reviewed by me: Right lung base opacity and lingular scarring.  Right hemidiaphragm elevation chest x-ray overall unchanged from 2024.  CT 2020: showing stable nodules.    Lung Health: Functional status: see above.  Covid vaccine: through PCP.  Influenza vaccine: will give.  Pneumonococcal vaccine: UTD PNA.  Smoking: non smoker.  Worked from home and no exposures.   Allergies: Fastin  [phentermine ], Erythromycin, and Lisinopril  History: Past Medical History:  Diagnosis Date   Anemia    Anxiety disorder    Asthma    Chronic cough    Complication  of anesthesia    Had ERCP in 2006 and led to respiratory failure, caused pancreatits, was in coma for 30days.   Depression    GERD (gastroesophageal reflux disease)    History of ERCP    History of pancreatitis    History of tracheostomy    HTN (hypertension)    Hyperglycemia    Hypokalemia    Pneumonia    Pre-diabetes    Respiratory failure (HCC)    Sleep apnea    CPAP   Past Surgical History:  Procedure Laterality Date   ABDOMINAL HYSTERECTOMY     Total Hysterectomy    APPENDECTOMY     BOTOX  INJECTION N/A 01/22/2023   Procedure: BOTOX  INJECTION 100 UNITS;  Surgeon: Elisabeth Valli BIRCH, MD;  Location: [facility];  Service: Urology;  Laterality: N/A;   BREAST REDUCTION SURGERY     CHOLECYSTECTOMY     COLONOSCOPY N/A 06/26/2023   Procedure: COLONOSCOPY;  Surgeon: Rollin Dover, MD;  Location: [facility];  Service: Gastroenterology;  Laterality: N/A;   COLONOSCOPY WITH PROPOFOL  N/A 12/21/2015   Procedure: COLONOSCOPY WITH PROPOFOL ;  Surgeon: Dover Rollin, MD;  Location: [facility];  Service: Endoscopy;  Laterality: N/A;   CYSTOSCOPY WITH BIOPSY N/A 01/22/2023   Procedure: CYSTOSCOPY;  Surgeon: Elisabeth Valli BIRCH, MD;  Location: [facility];  Service: Urology;  Laterality: N/A;  60 MINUTE CASE   ERCP     ESOPHAGOGASTRODUODENOSCOPY N/A 12/21/2015  Procedure: ESOPHAGOGASTRODUODENOSCOPY (EGD);  Surgeon: Belvie Just, MD;  Location: [facility];  Service: Endoscopy;  Laterality: N/A;   ESOPHAGOGASTRODUODENOSCOPY N/A 06/26/2023   Procedure: EGD (ESOPHAGOGASTRODUODENOSCOPY);  Surgeon: Just Belvie, MD;  Location: [facility];  Service: Gastroenterology;  Laterality: N/A;   GIVENS CAPSULE STUDY N/A 01/29/2016   Procedure: GIVENS CAPSULE STUDY;  Surgeon: Renaye Sous, MD;  Location: [facility];  Service: Endoscopy;  Laterality: N/A;   HAMMER TOE SURGERY     INTESTINAL MALROTATION REPAIR  1999   TUBAL LIGATION     Family History  Problem Relation Age of  Onset   Diabetes Other    Hypertension Other    Social History   Socioeconomic History   Marital status: Married    Spouse name: Aida   Number of children: Not on file   Years of education: Not on file   Highest education level: Master's degree (e.g., MA, MS, MEng, MEd, MSW, MBA)  Occupational History   Occupation: Chief Operating Officer: [employer]  Tobacco Use   Smoking status: Never   Smokeless tobacco: Never  Vaping Use   Vaping status: Never Used  Substance and Sexual Activity   Alcohol use: No   Drug use: Never   Sexual activity: Not Currently    Birth control/protection: Surgical  Other Topics Concern   Not on file  Social History Narrative   Regular Exercise -  NO   Lives with husband/2025   Social Drivers of Health   Financial Resource Strain: Low Risk  (09/09/2023)   Overall Financial Resource Strain (CARDIA)    Difficulty of Paying Living Expenses: Not hard at all  Food Insecurity: No Food Insecurity (09/09/2023)   Hunger Vital Sign    Worried About Running Out of Food in the Last Year: Never true    Ran Out of Food in the Last Year: Never true  Transportation Needs: No Transportation Needs (09/09/2023)   PRAPARE - Administrator, Civil Service (Medical): No    Lack of Transportation (Non-Medical): No  Physical Activity: Inactive (09/09/2023)   Exercise Vital Sign    Days of Exercise per Week: 0 days    Minutes of Exercise per Session: 0 min  Stress: No Stress Concern Present (09/09/2023)   [employer] of Occupational Health - Occupational Stress Questionnaire    Feeling of Stress: Not at all  Social Connections: Moderately Isolated (09/09/2023)   Social Connection and Isolation Panel    Frequency of Communication with Friends and Family: More than three times a week    Frequency of Social Gatherings with Friends and Family: Once a week    Attends Religious Services: Never    Database administrator or Organizations: No     Attends Banker Meetings: Never    Marital Status: Married  Catering manager Violence: Not At Risk (09/09/2023)   Humiliation, Afraid, Rape, and Kick questionnaire    Fear of Current or Ex-Partner: No    Emotionally Abused: No    Physically Abused: No    Sexually Abused: No    Immunization status: Immunization History  Administered Date(s) Administered   Fluad Trivalent(High Dose 65+) 11/19/2022   INFLUENZA, HIGH DOSE SEASONAL PF 10/31/2021   Influenza Split 09/25/2011, 10/14/2013   Influenza Whole 10/18/2009   Influenza,inj,Quad PF,6+ Mos 10/28/2012, 10/11/2013, 10/13/2017, 10/02/2018, 11/15/2019   Influenza-Unspecified 10/07/2015, 10/05/2018, 11/12/2020   PFIZER(Purple Top)SARS-COV-2 Vaccination 04/16/2019, 05/15/2019, 11/19/2019, 04/23/2020   Pfizer Covid-19 Vaccine Bivalent Booster 28yrs & up  11/12/2020   Pfizer(Comirnaty)Fall Seasonal Vaccine 12 years and older 10/31/2021   Pneumococcal Conjugate-13 10/23/2015   Pneumococcal Polysaccharide-23 01/06/2006, 10/28/2012, 11/15/2019   Respiratory Syncytial Virus Vaccine,Recomb Aduvanted(Arexvy) 10/31/2021   Td 03/20/2009   Tdap 02/10/2019   Zoster Recombinant(Shingrix ) 05/21/2021    Current Meds:  Current Outpatient Medications:    acetaminophen  (TYLENOL ) 500 MG tablet, Take 1,000 mg by mouth in the morning and at bedtime., Disp: , Rfl:    albuterol  (VENTOLIN  HFA) 108 (90 Base) MCG/ACT inhaler, INHALE 1-2 PUFFS BY MOUTH EVERY 6 HOURS AS NEEDED FOR WHEEZE OR SHORTNESS OF BREATH, Disp: 6.7 each, Rfl: 1   budesonide -formoterol  (SYMBICORT ) 80-4.5 MCG/ACT inhaler, INHALE 2 PUFFS INTO THE LUNGS TWICE A DAY, Disp: 30.6 each, Rfl: 1   DULoxetine  (CYMBALTA ) 60 MG capsule, Take 1 capsule (60 mg total) by mouth daily., Disp: 90 capsule, Rfl: 0   esomeprazole  (NEXIUM ) 40 MG capsule, TAKE 1 CAPSULE (40 MG TOTAL) BY MOUTH DAILY AT 12 NOON., Disp: 90 capsule, Rfl: 0   estradiol (ESTRACE) 0.1 MG/GM vaginal cream, PLEASE SEE ATTACHED  FOR DETAILED DIRECTIONS (Patient taking differently: PLEASE SEE ATTACHED FOR DETAILED DIRECTIONS), Disp: , Rfl:    fluocinonide -emollient (LIDEX -E) 0.05 % cream, APPLY TO AFFECTED AREA TWICE A DAY (Patient taking differently: APPLY TO AFFECTED AREA TWICE A DAY), Disp: 60 g, Rfl: 1   fluticasone  (FLONASE ) 50 MCG/ACT nasal spray, Place 2 sprays into both nostrils daily., Disp: 16 g, Rfl: 2   folic acid  (FOLVITE ) 1 MG tablet, Take 1 tablet (1 mg total) by mouth daily., Disp: 90 tablet, Rfl: 1   iron  polysaccharides (NIFEREX) 150 MG capsule, Take 150 mg by mouth daily., Disp: , Rfl:    levalbuterol  (XOPENEX  HFA) 45 MCG/ACT inhaler, Inhale 2 puffs into the lungs every 6 (six) hours as needed for wheezing., Disp: 15 g, Rfl: 3   LINZESS  290 MCG CAPS capsule, Take 1 capsule (290 mcg total) by mouth every morning., Disp: 30 capsule, Rfl: 5   triamterene -hydrochlorothiazide  (DYAZIDE ) 37.5-25 MG capsule, Take 1 each (1 capsule total) by mouth daily., Disp: 90 each, Rfl: 0   Vitamin D , Ergocalciferol , (DRISDOL ) 1.25 MG (50000 UNIT) CAPS capsule, Take 1 capsule (50,000 Units total) by mouth every 7 (seven) days., Disp: 12 capsule, Rfl: 0      Objective:  BP 138/80   Pulse 94   Temp 98.9 F (37.2 C)   Ht 5' 3 (1.6 m)   Wt 251 lb 9.6 oz (114.1 kg)   SpO2 97% Comment: RA  BMI 44.57 kg/m  BMI Readings from Last 3 Encounters:  09/29/23 44.57 kg/m  09/09/23 44.82 kg/m  08/18/23 44.89 kg/m    General: not in distress patient morbidly obese.  Lungs: clear to auscultation bilaterally.  Heart: regular rate rhythm, no murmur appreciated.  Abdomen: non tender, non distended. Normal BS.  Neuro: axox3.  Moving all extremities.    Diagnostic Review:  Last metabolic panel Lab Results  Component Value Date   GLUCOSE 99 08/18/2023   NA 140 08/18/2023   K 4.1 08/18/2023   CL 105 08/18/2023   CO2 28 08/18/2023   BUN 11 08/18/2023   CREATININE 0.84 08/18/2023   GFR 71.65 08/18/2023   CALCIUM  9.0  08/18/2023   PROT 7.3 05/25/2023   ALBUMIN 4.0 05/25/2023   BILITOT 0.2 05/25/2023   ALKPHOS 113 05/25/2023   AST 18 05/25/2023   ALT 17 05/25/2023   ANIONGAP 12 07/13/2019       Assessment & Plan:  Assessment & Plan Dilation of pulmonary artery (HCC) ECHO without elevated RVSP w TR v max of 2.81m/s. Patient does not have pHTN. PA artery is borderline but smaller than aorta (3.5 cm vs 3.8 cm). No concern for pulmonary artery HTN and hence we will not order any further work up.  OSA (obstructive sleep apnea) On ACPAP 10-15 cm H2o. Well controlled. Good compliance. Follow up with Dr Jude.  Pulmonary nodules Stable nodules from before. Not seen on latest cardiac CT scan. Follow with Dr Jude.   No orders of the defined types were placed in this encounter.     Return for continue regular follow up with Dr Jude. .   Reia Viernes, MD

## 2023-09-29 NOTE — Patient Instructions (Addendum)
 Notification of test results are managed in the following manner: If there are any recommendations or changes to the plan of care discussed in office today, we will contact you and let you know what they are. If you do not hear from us , then your results are normal/expected and you can view them through your MyChart account, or a letter will be sent to you. Thank you again for trusting us  with your care [facility].

## 2023-09-29 NOTE — Assessment & Plan Note (Signed)
 On ACPAP 10-15 cm H2o. Well controlled. Good compliance. Follow up with Dr Jude.

## 2023-10-13 ENCOUNTER — Other Ambulatory Visit: Payer: Self-pay | Admitting: Internal Medicine

## 2023-10-13 DIAGNOSIS — I10 Essential (primary) hypertension: Secondary | ICD-10-CM

## 2023-10-25 ENCOUNTER — Other Ambulatory Visit: Payer: Self-pay | Admitting: Internal Medicine

## 2023-10-25 DIAGNOSIS — E559 Vitamin D deficiency, unspecified: Secondary | ICD-10-CM

## 2023-10-30 ENCOUNTER — Other Ambulatory Visit: Payer: Self-pay | Admitting: Internal Medicine

## 2023-10-30 DIAGNOSIS — I10 Essential (primary) hypertension: Secondary | ICD-10-CM

## 2023-10-31 ENCOUNTER — Other Ambulatory Visit: Payer: Self-pay | Admitting: Internal Medicine

## 2023-10-31 DIAGNOSIS — F418 Other specified anxiety disorders: Secondary | ICD-10-CM

## 2023-11-13 ENCOUNTER — Other Ambulatory Visit: Payer: Self-pay | Admitting: Internal Medicine

## 2023-11-13 DIAGNOSIS — E611 Iron deficiency: Secondary | ICD-10-CM

## 2023-11-18 ENCOUNTER — Ambulatory Visit: Payer: Self-pay | Admitting: Internal Medicine

## 2023-11-18 ENCOUNTER — Ambulatory Visit: Admitting: Internal Medicine

## 2023-11-18 ENCOUNTER — Encounter: Payer: Self-pay | Admitting: Internal Medicine

## 2023-11-18 VITALS — BP 132/76 | HR 96 | Temp 98.4°F | Resp 16 | Ht 63.0 in | Wt 251.4 lb

## 2023-11-18 DIAGNOSIS — E559 Vitamin D deficiency, unspecified: Secondary | ICD-10-CM | POA: Diagnosis not present

## 2023-11-18 DIAGNOSIS — I272 Pulmonary hypertension, unspecified: Secondary | ICD-10-CM

## 2023-11-18 DIAGNOSIS — M7989 Other specified soft tissue disorders: Secondary | ICD-10-CM

## 2023-11-18 DIAGNOSIS — R7303 Prediabetes: Secondary | ICD-10-CM

## 2023-11-18 DIAGNOSIS — I10 Essential (primary) hypertension: Secondary | ICD-10-CM

## 2023-11-18 DIAGNOSIS — M79661 Pain in right lower leg: Secondary | ICD-10-CM

## 2023-11-18 DIAGNOSIS — G5603 Carpal tunnel syndrome, bilateral upper limbs: Secondary | ICD-10-CM

## 2023-11-18 DIAGNOSIS — E785 Hyperlipidemia, unspecified: Secondary | ICD-10-CM

## 2023-11-18 LAB — D-DIMER, QUANTITATIVE: D-Dimer, Quant: 0.39 ug{FEU}/mL (ref <0.50)

## 2023-11-18 LAB — LIPID PANEL
Cholesterol: 180 mg/dL (ref 0–200)
HDL: 65.8 mg/dL (ref >39.00)
LDL Cholesterol: 96 mg/dL (ref 0–99)
NonHDL: 114.03
Total CHOL/HDL Ratio: 3
Triglycerides: 90 mg/dL (ref 0.0–149.0)
VLDL: 18 mg/dL (ref 0.0–40.0)

## 2023-11-18 LAB — VITAMIN D 25 HYDROXY (VIT D DEFICIENCY, FRACTURES): VITD: 43.18 ng/mL (ref 30.00–100.00)

## 2023-11-18 LAB — HEMOGLOBIN A1C: Hgb A1c MFr Bld: 6 % (ref 4.6–6.5)

## 2023-11-18 NOTE — Progress Notes (Signed)
 Subjective:  Patient ID: [name], female    DOB: July 30, 1955  Age: 68 y.o. MRN: [id number]  CC: Hypertension   HPI [name] presents for f/up -----  Discussed the use of AI scribe software for clinical note transcription with the patient, who gave verbal consent to proceed.  History of Present Illness [name] is a 68 year old female who presents with left hand numbness and tingling for three months and right foot swelling.  She has been experiencing numbness and tingling in the left hand for the past three months, primarily affecting the tips of her fingers and thumb. The symptoms are present daily and are more pronounced on the left side compared to the right. She denies neck pain. She reports longstanding difficulty with hand strength, such as opening jars, which has not changed recently. The symptoms occur both during the day and at night, causing her hands to feel numb.  She recalls being diagnosed with carpal tunnel syndrome years ago, but it was never treated. She takes vitamin supplements including potassium, magnesium , iron , and B12 (1000 mg).  She also reports swelling in her right foot, noticed this morning while putting on her shoes. The swelling is localized around the right ankle and is not painful. She attributes the swelling to recent dietary changes, specifically increased consumption of bratwurst and mustard, which are high in salt. No swelling in the left foot or pain in the calf. She has experienced leg cramps in the past few days, which she managed with potassium and magnesium  supplements, and notes that she did not have a cramp this morning.  No issues with bowel movements and she takes Linzess  daily.    Outpatient Medications Prior to Visit  Medication Sig Dispense Refill   acetaminophen  (TYLENOL ) 500 MG tablet Take 1,000 mg by mouth in the morning and at bedtime.     albuterol  (VENTOLIN  HFA) 108 (90 Base) MCG/ACT inhaler INHALE 1-2 PUFFS BY MOUTH  EVERY 6 HOURS AS NEEDED FOR WHEEZE OR SHORTNESS OF BREATH 6.7 each 1   budesonide -formoterol  (SYMBICORT ) 80-4.5 MCG/ACT inhaler INHALE 2 PUFFS INTO THE LUNGS TWICE A DAY 30.6 each 1   DULoxetine  (CYMBALTA ) 60 MG capsule TAKE 1 CAPSULE BY MOUTH EVERY DAY 90 capsule 0   esomeprazole  (NEXIUM ) 40 MG capsule TAKE 1 CAPSULE (40 MG TOTAL) BY MOUTH DAILY AT 12 NOON. 90 capsule 0   estradiol (ESTRACE) 0.1 MG/GM vaginal cream PLEASE SEE ATTACHED FOR DETAILED DIRECTIONS (Patient taking differently: PLEASE SEE ATTACHED FOR DETAILED DIRECTIONS)     fluocinonide -emollient (LIDEX -E) 0.05 % cream APPLY TO AFFECTED AREA TWICE A DAY (Patient taking differently: APPLY TO AFFECTED AREA TWICE A DAY) 60 g 1   fluticasone  (FLONASE ) 50 MCG/ACT nasal spray Place 2 sprays into both nostrils daily. 16 g 2   folic acid  (FOLVITE ) 1 MG tablet Take 1 tablet (1 mg total) by mouth daily. 90 tablet 1   iron  polysaccharides (NIFEREX) 150 MG capsule Take 150 mg by mouth daily.     levalbuterol  (XOPENEX  HFA) 45 MCG/ACT inhaler Inhale 2 puffs into the lungs every 6 (six) hours as needed for wheezing. 15 g 3   LINZESS  290 MCG CAPS capsule Take 1 capsule (290 mcg total) by mouth every morning. 30 capsule 5   triamterene -hydrochlorothiazide  (DYAZIDE ) 37.5-25 MG capsule TAKE 1 CAPSULE BY MOUTH DAILY 90 capsule 3   Vitamin D , Ergocalciferol , (DRISDOL ) 1.25 MG (50000 UNIT) CAPS capsule TAKE 1 CAPSULE (50,000 UNITS TOTAL) BY MOUTH EVERY 7 (SEVEN) DAYS 12  capsule 0   No facility-administered medications prior to visit.    ROS Review of Systems  Constitutional:  Negative for appetite change, chills, diaphoresis, fatigue and fever.  HENT: Negative.    Respiratory: Negative.  Negative for cough, chest tightness, shortness of breath and wheezing.   Cardiovascular:  Negative for chest pain, palpitations and leg swelling.  Gastrointestinal:  Positive for constipation. Negative for abdominal pain, blood in stool, diarrhea, nausea and vomiting.   Genitourinary:  Negative for difficulty urinating.  Musculoskeletal:  Positive for arthralgias and neck pain. Negative for gait problem.  Skin: Negative.   Neurological:  Positive for numbness. Negative for dizziness, weakness and headaches.  Hematological:  Negative for adenopathy. Does not bruise/bleed easily.  Psychiatric/Behavioral: Negative.      Objective:  BP 132/76 (BP Location: Left Arm, Patient Position: Sitting)   Pulse 96   Temp 98.4 F (36.9 C) (Temporal)   Resp 16   Ht 5' 3 (1.6 m)   Wt 251 lb 6.4 oz (114 kg)   SpO2 95%   BMI 44.53 kg/m   BP Readings from Last 3 Encounters:  11/18/23 132/76  09/29/23 138/80  08/18/23 132/68    Wt Readings from Last 3 Encounters:  11/18/23 251 lb 6.4 oz (114 kg)  09/29/23 251 lb 9.6 oz (114.1 kg)  09/09/23 253 lb (114.8 kg)    Physical Exam Vitals reviewed.  Constitutional:      Appearance: Normal appearance.  HENT:     Mouth/Throat:     Mouth: Mucous membranes are moist.  Eyes:     General: No scleral icterus.    Conjunctiva/sclera: Conjunctivae normal.  Cardiovascular:     Rate and Rhythm: Normal rate and regular rhythm.     Heart sounds: No murmur heard.    No friction rub. No gallop.  Pulmonary:     Effort: Pulmonary effort is normal. No respiratory distress.     Breath sounds: No stridor. No wheezing, rhonchi or rales.  Chest:     Chest wall: No tenderness.  Abdominal:     General: Abdomen is protuberant. Bowel sounds are normal. There is no distension.     Palpations: Abdomen is soft. There is no hepatomegaly, splenomegaly or mass.     Tenderness: There is no abdominal tenderness. There is no guarding.  Musculoskeletal:        General: No swelling, tenderness or deformity.     Cervical back: Neck supple.     Right lower leg: Edema (trace pitting edema over the right ankle) present.     Left lower leg: No edema.  Skin:    General: Skin is warm.     Findings: No lesion or rash.  Neurological:      General: No focal deficit present.     Mental Status: She is alert.  Psychiatric:        Mood and Affect: Mood normal.        Behavior: Behavior normal.     Lab Results  Component Value Date   WBC 7.1 08/18/2023   HGB 12.1 08/18/2023   HCT 37.8 08/18/2023   PLT 318.0 08/18/2023   GLUCOSE 99 08/18/2023   CHOL 180 11/18/2023   TRIG 90.0 11/18/2023   HDL 65.80 11/18/2023   LDLCALC 96 11/18/2023   ALT 17 05/25/2023   AST 18 05/25/2023   NA 140 08/18/2023   K 4.1 08/18/2023   CL 105 08/18/2023   CREATININE 0.84 08/18/2023   BUN 11 08/18/2023   CO2  28 08/18/2023   TSH 1.80 05/25/2023   HGBA1C 6.0 11/18/2023    CT CORONARY MORPH W/CTA COR W/SCORE W/CA W/CM &/OR WO/CM Addendum Date: 07/14/2023 ADDENDUM REPORT: 07/14/2023 15:51 CLINICAL DATA:  68 yo female with chest pain/anginal equiv, high CAD risk, not treadmill candidate EXAM: Cardiac/Coronary CTA TECHNIQUE: A non-contrast, gated CT scan was obtained with axial slices of 2.5 mm through the heart for calcium  scoring. Calcium  scoring was performed using the Agatston method. A 120 kV prospective, gated, contrast cardiac CT scan was obtained. Gantry rotation speed was 230 msec and collimation was 0.63 mm. Two sublingual nitroglycerin  tablets (0.8 mg) were given. The 3D data set was reconstructed with motion correction for the best systolic or diastolic phase. Images were analyzed on a dedicated workstation using MPR, MIP, and VRT modes. The patient received 100mL OMNIPAQUE  IOHEXOL  350 MG/ML SOLN of contrast. FINDINGS: Image quality: Excellent. Noise artifact is: Limited. Coronary arteries: Normal coronary origins.  Right dominance. Right Coronary Artery: Dominant. No disease. Normal R-PLB and R-PDA branches. Left Main Coronary Artery: Normal. Bifurcates into the LAD and LCx arteries. Left Anterior Descending Coronary Artery: Large anterior artery that wraps around the apex. No disease. 2 small high diagonal branches (<2 mm) without significant  stenosis. Left Circumflex Artery: AV groove vessel, no disease. Aorta: Normal size, 31 mm at the mid ascending aorta (level of the PA bifurcation) measured double oblique. No calcifications. No dissection. Aortic Valve: Trileaflet. No calcifications. Other findings: Normal pulmonary vein drainage into the left atrium. Normal left atrial appendage without a thrombus. Dilated main pulmonary artery to 35 mm, possibly suggestive of pulmonary hypertension. Extra-cardiac findings: See attached radiology report for non-cardiac structures. IMPRESSION: 1. No evidence of CAD, CADRADS = 0. 2. Normal coronary origin with right dominance. 3. Coronary artery calcium  score is 0. 4. Dilated main pulmonary artery to 35 mm, possibly suggestive of pulmonary hypertension. 5. Consider non-coronary causes of chest pain. RECOMMENDATIONS: 1. CAD-RADS 0: No evidence of CAD (0%). Consider non-atherosclerotic causes of chest pain. 2. CAD-RADS 1: Minimal non-obstructive CAD (0-24%). Consider non-atherosclerotic causes of chest pain. Consider preventive therapy and risk factor modification. 3. CAD-RADS 2: Mild non-obstructive CAD (25-49%). Consider non-atherosclerotic causes of chest pain. Consider preventive therapy and risk factor modification. 4. CAD-RADS 3: Moderate stenosis. Consider symptom-guided anti-ischemic pharmacotherapy as well as risk factor modification per guideline directed care. Additional analysis with CT FFR will be submitted. 5. CAD-RADS 4: Severe stenosis. (70-99% or > 50% left main). Cardiac catheterization or CT FFR is recommended. Consider symptom-guided anti-ischemic pharmacotherapy as well as risk factor modification per guideline directed care. Invasive coronary angiography recommended with revascularization per published guideline statements. 6. CAD-RADS 5: Total coronary occlusion (100%). Consider cardiac catheterization or viability assessment. Consider symptom-guided anti-ischemic pharmacotherapy as well as risk  factor modification per guideline directed care. 7. CAD-RADS N: Non-diagnostic study. Obstructive CAD can't be excluded. Alternative evaluation is recommended. Electronically Signed   By: Vinie JAYSON Maxcy M.D.   On: 07/14/2023 15:51   Addendum Date: 07/14/2023 ADDENDUM REPORT: 07/14/2023 15:51 CLINICAL DATA:  68 yo female with chest pain/anginal equiv, high CAD risk, not treadmill candidate EXAM: Cardiac/Coronary CTA TECHNIQUE: A non-contrast, gated CT scan was obtained with axial slices of 2.5 mm through the heart for calcium  scoring. Calcium  scoring was performed using the Agatston method. A 120 kV prospective, gated, contrast cardiac CT scan was obtained. Gantry rotation speed was 230 msec and collimation was 0.63 mm. Two sublingual nitroglycerin  tablets (0.8 mg) were given. The 3D data set  was reconstructed with motion correction for the best systolic or diastolic phase. Images were analyzed on a dedicated workstation using MPR, MIP, and VRT modes. The patient received OMNIPAQUE  IOHEXOL  350 MG/ML SOLN of contrast. FINDINGS: Image quality: Excellent. Noise artifact is: Limited. Coronary arteries: Normal coronary origins.  Right dominance. Right Coronary Artery: Dominant. No disease. Normal R-PLB and R-PDA branches. Left Main Coronary Artery: Normal. Bifurcates into the LAD and LCx arteries. Left Anterior Descending Coronary Artery: Large anterior artery that wraps around the apex. No disease. 2 small high diagonal branches (<2 mm) without significant stenosis. Left Circumflex Artery: AV groove vessel, no disease. Aorta: Normal size, 31 mm at the mid ascending aorta (level of the PA bifurcation) measured double oblique. No calcifications. No dissection. Aortic Valve: Trileaflet. No calcifications. Other findings: Normal pulmonary vein drainage into the left atrium. Normal left atrial appendage without a thrombus. Dilated main pulmonary artery to 35 mm, possibly suggestive of pulmonary hypertension.  Extra-cardiac findings: See attached radiology report for non-cardiac structures. IMPRESSION: 1. No evidence of CAD, CADRADS = 0. 2. Normal coronary origin with right dominance. 3. Coronary artery calcium  score is 0. 4. Dilated main pulmonary artery to 35 mm, possibly suggestive of pulmonary hypertension. 5. Consider non-coronary causes of chest pain. RECOMMENDATIONS: 1. CAD-RADS 0: No evidence of CAD (0%). Consider non-atherosclerotic causes of chest pain. 2. CAD-RADS 1: Minimal non-obstructive CAD (0-24%). Consider non-atherosclerotic causes of chest pain. Consider preventive therapy and risk factor modification. 3. CAD-RADS 2: Mild non-obstructive CAD (25-49%). Consider non-atherosclerotic causes of chest pain. Consider preventive therapy and risk factor modification. 4. CAD-RADS 3: Moderate stenosis. Consider symptom-guided anti-ischemic pharmacotherapy as well as risk factor modification per guideline directed care. Additional analysis with CT FFR will be submitted. 5. CAD-RADS 4: Severe stenosis. (70-99% or > 50% left main). Cardiac catheterization or CT FFR is recommended. Consider symptom-guided anti-ischemic pharmacotherapy as well as risk factor modification per guideline directed care. Invasive coronary angiography recommended with revascularization per published guideline statements. 6. CAD-RADS 5: Total coronary occlusion (100%). Consider cardiac catheterization or viability assessment. Consider symptom-guided anti-ischemic pharmacotherapy as well as risk factor modification per guideline directed care. 7. CAD-RADS N: Non-diagnostic study. Obstructive CAD can't be excluded. Alternative evaluation is recommended. Electronically Signed   By: Vinie JAYSON Maxcy M.D.   On: 07/14/2023 15:51   Result Date: 07/14/2023 EXAM: OVER-READ INTERPRETATION  CT CHEST The following report is a limited chest CT over-read performed by radiologist Dr. Kate Plummer of [facility] on 07/13/2023. This over-read  does not include interpretation of cardiac or coronary anatomy or pathology. The calcium  score CT interpretation by the cardiologist is attached. COMPARISON:  CT chest 09/06/2021, CT chest 10/19/2017 FINDINGS: Stable poorly visualized couple of right lower lobe 8 mm pulmonary nodules. Associated overlying patchy airspace opacities of the right lower lobe. Atelectasis of the right middle lobe and lingula. Chronic stable pulmonary nodules within the left lower lobe and right middle lobe-no further follow-up indicated. No lymphadenopathy. Gastric lap band appropriately position with associated tiny hiatal hernia. No acute osseous abnormality. Multilevel degenerative changes of the spine. No acute soft tissue abnormality. IMPRESSION: 1. Patchy airspace opacity of the right lower lobe may represent atelectasis versus infection. Underlying grossly stable 8 mm chronic right lower lobe pulmonary nodules not well visualized. No further follow-up indicated of the pulmonary nodules due to chronicity. Followup PA and lateral chest X-ray is recommended in 3-4 weeks to ensure resolution of patchy airspace opacity. 2. Gastric lap band appropriately position with associated  tiny hiatal hernia. Electronically Signed: By: Morgane  Naveau M.D. On: 07/13/2023 14:16   ECHOCARDIOGRAM COMPLETE Result Date: 07/13/2023    ECHOCARDIOGRAM REPORT   Patient Name:   [name] Date of Exam: 07/13/2023 Medical Rec #:  [id number]     Height:       63.0 in Accession #:    [id number]    Weight:       255.0 lb Date of Birth:  10/16/1955      BSA:          2.144 m Patient Age:    67 years      BP:           166/94 mmHg Patient Gender: F             HR:           65 bpm. Exam Location:  [address] Procedure: 2D Echo, 3D Echo, Cardiac Doppler, Color Doppler and Strain Analysis            (Both Spectral and Color Flow Doppler were utilized during            procedure). Indications:    R06.00 Dyspnea  History:        Patient has prior history of  Echocardiogram examinations, most                 recent 07/31/2022. Abnormal ECG, CKD; Risk Factors:Sleep Apnea,                 HLD and Hypertension.  Sonographer:    Waldo Guadalajara RCS Referring Phys: [id number] NIKI FALCON JOYCE IMPRESSIONS  1. Left ventricular ejection fraction, by estimation, is 55 to 60%. Left ventricular ejection fraction by 3D volume is 56 %. The left ventricle has normal function. The left ventricle has no regional wall motion abnormalities. Left ventricular diastolic  parameters are indeterminate. The average left ventricular global longitudinal strain is -19.5 %. The global longitudinal strain is normal.  2. Right ventricular systolic function is normal. The right ventricular size is normal. There is normal pulmonary artery systolic pressure.  3. The mitral valve is normal in structure. Trivial mitral valve regurgitation. No evidence of mitral stenosis.  4. The aortic valve is tricuspid. Aortic valve regurgitation is not visualized. No aortic stenosis is present.  5. The inferior vena cava is normal in size with greater than 50% respiratory variability, suggesting right atrial pressure of 3 mmHg. FINDINGS  Left Ventricle: Left ventricular ejection fraction, by estimation, is 55 to 60%. Left ventricular ejection fraction by 3D volume is 56 %. The left ventricle has normal function. The left ventricle has no regional wall motion abnormalities. The average left ventricular global longitudinal strain is -19.5 %. Strain was performed and the global longitudinal strain is normal. The left ventricular internal cavity size was normal in size. There is no left ventricular hypertrophy. Left ventricular diastolic parameters are indeterminate. Right Ventricle: The right ventricular size is normal. No increase in right ventricular wall thickness. Right ventricular systolic function is normal. There is normal pulmonary artery systolic pressure. The tricuspid regurgitant velocity is 2.42 m/s, and  with an  assumed right atrial pressure of 3 mmHg, the estimated right ventricular systolic pressure is 26.4 mmHg. Left Atrium: Left atrial size was normal in size. Right Atrium: Right atrial size was normal in size. Pericardium: There is no evidence of pericardial effusion. Mitral Valve: The mitral valve is normal in structure. Trivial mitral valve regurgitation. No evidence of mitral valve  stenosis. Tricuspid Valve: The tricuspid valve is normal in structure. Tricuspid valve regurgitation is mild . No evidence of tricuspid stenosis. Aortic Valve: The aortic valve is tricuspid. Aortic valve regurgitation is not visualized. No aortic stenosis is present. Pulmonic Valve: The pulmonic valve was normal in structure. Pulmonic valve regurgitation is trivial. No evidence of pulmonic stenosis. Aorta: The aortic root is normal in size and structure. Venous: The inferior vena cava is normal in size with greater than 50% respiratory variability, suggesting right atrial pressure of 3 mmHg. IAS/Shunts: No atrial level shunt detected by color flow Doppler. Additional Comments: 3D was performed not requiring image post processing on an independent workstation and was normal.  LEFT VENTRICLE PLAX 2D LVIDd:         4.80 cm         Diastology LVIDs:         3.30 cm         LV e' medial:    5.98 cm/s LV PW:         1.20 cm         LV E/e' medial:  9.7 LV IVS:        0.90 cm         LV e' lateral:   7.62 cm/s LVOT diam:     2.00 cm         LV E/e' lateral: 7.6 LV SV:         61 LV SV Index:   28              2D Longitudinal LVOT Area:     3.14 cm        Strain                                2D Strain GLS   -19.5 %                                Avg:                                 3D Volume EF                                LV 3D EF:    Left                                             ventricul                                             ar                                             ejection  fraction                                             by 3D                                             volume is                                             56 %.                                 3D Volume EF:                                3D EF:        56 %                                LV EDV:       120 ml                                LV ESV:       53 ml                                LV SV:        67 ml RIGHT VENTRICLE RV Basal diam:  2.50 cm RV S prime:     13.80 cm/s TAPSE (M-mode): 2.3 cm RVSP:           26.4 mmHg LEFT ATRIUM             Index        RIGHT ATRIUM           Index LA diam:        3.70 cm 1.73 cm/m   RA Pressure: 3.00 mmHg LA Vol (A2C):   56.4 ml 26.31 ml/m  RA Area:     10.40 cm LA Vol (A4C):   41.7 ml 19.45 ml/m  RA Volume:   22.40 ml  10.45 ml/m LA Biplane Vol: 48.8 ml 22.76 ml/m  AORTIC VALVE LVOT Vmax:   87.50 cm/s LVOT Vmean:  58.700 cm/s LVOT VTI:    0.193 m  AORTA Ao Root diam: 2.90 cm Ao Asc diam:  3.40 cm MITRAL VALVE               TRICUSPID VALVE MV Area (PHT):             TR Peak grad:   23.4 mmHg MV Decel Time:             TR Vmax:        242.00 cm/s MV E velocity: 58.20 cm/s  Estimated RAP:  3.00 mmHg MV A velocity: 70.20 cm/s  RVSP:  26.4 mmHg MV E/A ratio:  0.83                            SHUNTS                            Systemic VTI:  0.19 m                            Systemic Diam: 2.00 cm Morene Brownie Electronically signed by Morene Brownie Signature Date/Time: 07/13/2023/8:40:20 AM    Final     Assessment & Plan:  Hyperlipidemia with target LDL less than 130 -     Lipid panel; Future  Vitamin D  deficiency -     VITAMIN D  25 Hydroxy (Vit-D Deficiency, Fractures); Future  Pulmonary hypertension (HCC)- She is asx.  Bilateral carpal tunnel syndrome -     Ambulatory referral to Physical Medicine Rehab  Prediabetes -     Hemoglobin A1c; Future  Pain and swelling of right lower leg -     D-dimer, quantitative; Future  Essential hypertension-  Her BP is well controlled.     Follow-up: Return in about 4 months (around 03/17/2024).  Debby Molt, MD

## 2023-11-18 NOTE — Patient Instructions (Signed)
 Pinched Nerve in the Wrist (Carpal Tunnel Syndrome): What to Know  Pinched nerve in the wrist (carpal tunnel syndrome, or CTS) is a nerve problem that causes pain, numbness, and weakness in the wrist, hand, and fingers. The carpal tunnel is a narrow space that is on the palm side of your wrist. Repeated wrist motions or certain diseases may cause swelling in the tunnel. This swelling can pinch the main nerve in the wrist (the median nerve). What are the causes? CTS may be caused by: Moving your hand and wrist over and over again while doing a task. Hurting the wrist. Arthritis. A pocket of fluid (cyst) or a growth (tumor) in the carpal tunnel. Fluid buildup when you are pregnant. Use of tools that vibrate. In some cases, the cause of CTS is not known. What increases the risk? You're more likely to have CTS if: You have a job that makes you do these things: Move your hand firmly over and over again. Work with tools that vibrate, such as drills or sanders. You're female. You have diabetes, obesity, thyroid problems, or kidney failure. What are the signs or symptoms? Symptoms of this condition include: A tingling feeling in your fingers. You may feel this pain in the thumb, index finger, or middle finger. Tingling or loss of feeling in your hand. Pain in your entire arm. This pain may get worse when you bend your wrist and elbow for a long time. Pain in your wrist that goes up your arm to your shoulder. Pain that goes down into your palm or fingers. Weakness in your hands. You may find it hard to grab and hold items. Your symptoms may feel worse during the night. How is this diagnosed? CTS is diagnosed with a medical history and physical exam. Tests and imaging may also be done to: Check the electrical signals sent by your nerves into the muscles. Check how well electrical signals pass through your nerves. Check possible causes of your CTS. These include X-rays, ultrasound, and  MRI. How is this treated? CTS may be treated with: Lifestyle changes. You will be asked to stop or change the activity that caused your problem. Physical therapy. This may include: Exercises that stretch and strengthen the muscles and tendons in the wrist and hand. Nerve gliding or flossing exercises. These help keep nerves moving smoothly through the tissues around them. Occupational therapy. You'll learn how to use your hand again. Medicines for pain and swelling. You may have injections in your wrist. A wrist splint or brace. Surgery. Follow these instructions at home: If you have a splint or brace: Wear the splint or brace as told. Take it off only if your provider says you can. Check the skin around it every day. Tell your provider if you see problems. Loosen the splint or brace if your fingers tingle, are numb, or turn cold and blue. Keep the splint or brace clean and dry. If the splint or brace isn't waterproof: Do not let it get wet. Cover it when you take a bath or shower. Use a cover that doesn't let any water in. Managing pain, stiffness, and swelling  Use ice or an ice pack as told. If you have a splint or brace that you can take off, remove it only as told. Place a towel between your skin and the ice. Leave the ice on for 20 minutes, 2-3 times a day. If your skin turns red, take off the ice right away to prevent skin damage. The risk  of damage is higher if you can't feel pain, heat, or cold. Move your fingers often to reduce stiffness and swelling. General instructions Take your medicines only as told. Rest your wrist and hand from activity that may cause pain. If your CTS is caused by things you do at work, talk with your employer about making changes. For example, you may need a wrist pad to use while typing. Exercise as told. Follow instructions on how to do nerve gliding or flossing exercises. These help keep nerves in moving smoothly through the tissues around  them. Keep all follow-up visits. This is important. Where to find more information American Academy of Orthopedic Surgeons: orthoinfo.aaos.[employer] of Neurological Disorders and Stroke: BasicFM.no Contact a health care provider if: You have new symptoms. Your pain is not controlled with medicines. Your symptoms get worse. Get help right away if: Your hand or wrist tingles or is numb, and the symptoms become very bad. This information is not intended to replace advice given to you by your health care provider. Make sure you discuss any questions you have with your health care provider. Document Revised: 11/04/2022 Document Reviewed: 08/22/2022 Elsevier Patient Education  2024 [employer].

## 2023-12-16 ENCOUNTER — Encounter: Payer: Self-pay | Admitting: Physical Medicine and Rehabilitation

## 2023-12-16 ENCOUNTER — Ambulatory Visit: Admitting: Physical Medicine and Rehabilitation

## 2023-12-16 DIAGNOSIS — R202 Paresthesia of skin: Secondary | ICD-10-CM

## 2023-12-16 DIAGNOSIS — M79641 Pain in right hand: Secondary | ICD-10-CM

## 2023-12-16 DIAGNOSIS — R29898 Other symptoms and signs involving the musculoskeletal system: Secondary | ICD-10-CM

## 2023-12-16 NOTE — Progress Notes (Signed)
 Pain Scale   Average Pain 0 Patient advising she has Numbness and tingling also weakness bilaterally to hands. Patient is left hand dominate        +Driver, -BT, -Dye Allergies.

## 2023-12-17 NOTE — Progress Notes (Signed)
 [name] - 68 y.o. female MRN [id number]  Date of birth: 02/08/1955  Office Visit Note: Visit Date: 12/16/2023 PCP: Joshua Debby CROME, MD Referred by: Joshua Debby CROME, MD  Subjective: Chief Complaint  Patient presents with   Right Hand - Numbness, Weakness   Left Hand - Numbness, Weakness   HPI: [name] is a 68 y.o. female who comes in today at the request of Dr. Debby Joshua for evaluation and management of chronic, worsening and severe pain, numbness and tingling in the Bilateral upper extremities.  Patient is Left hand dominant.  She reports to me today very similar complaints to her primary care physician Dr. Joshua which is essentially 3 to 4 months of progressive worsening mainly numbness and tingling in the left hand but also some symptoms on the right.  She is left-handed as noted above.  She reports this somewhat global tingling but mainly in the radial digits and in particular the thumb on the left hand.  She initially felt like they were more nocturnal but she gets symptoms during the day [place] as well.  She is having trouble opening things and some difficulty with dropping objects and weakness particularly left more than right.  Again symptoms can occur day and night and it was been very concerning for her.  She has not really tried wrist splints or medications per se.  She reports that symptoms are very problematic at this point and she really has lost function.  She is not diabetic but has been diagnosed with prediabetes.  She does not have thyroid  disease.  No prior spinal surgeries and no frank radicular symptoms.  She reports being diagnosed in the past with carpal tunnel syndrome but no real treatment.   I spent more than 30 minutes speaking face-to-face with the patient with 50% of the time in counseling and discussing coordination of care.         Review of Systems  Musculoskeletal:  Positive for joint pain.  Neurological:  Positive for tingling and focal  weakness.  All other systems reviewed and are negative.  Otherwise per HPI.  Assessment & Plan: Visit Diagnoses:    ICD-10-CM   1. Paresthesia of skin  R20.2 NCV with EMG (electromyography)    Ambulatory referral to Orthopedic Surgery    2. Bilateral hand pain  M79.641 Ambulatory referral to Orthopedic Surgery   M79.642     3. Weakness of hand  R29.898 Ambulatory referral to Orthopedic Surgery       Plan: Impression: I do think clinically she has some underlying carpal tunnel syndrome of unknown severity.  She likely has some level of osteoarthritic changes in the hand as well contributing to some of the pain and discomfort.  Electrodiagnostic study performed today the above electrodiagnostic study is ABNORMAL and reveals evidence of:  a moderate left median nerve entrapment at the wrist (carpal tunnel syndrome) affecting sensory and motor components.   a mild right median nerve entrapment at the wrist (carpal tunnel syndrome) affecting sensory components.   There is no significant electrodiagnostic evidence of any other focal nerve entrapment, brachial plexopathy or cervical radiculopathy.   Recommendations: 1.  Follow-up with referring physician. 2.  Continue current management of symptoms. 3.  Continue use of resting splint at night-time and as needed during the day. 4.  Suggest surgical evaluation.  I went ahead today after talking with the patient and placed referral to our hand surgeon Gildardo Alderton, MD  Meds & Orders: No orders of  the defined types were placed in this encounter.   Orders Placed This Encounter  Procedures   Ambulatory referral to Orthopedic Surgery   NCV with EMG (electromyography)    Follow-up: Return for Debby Molt, MD and consultation placed with Gildardo Alderton, MD.   Procedures: No procedures performed  EMG & NCV Findings: Evaluation of the left median motor nerve showed prolonged distal onset latency (5.4 ms) and decreased conduction velocity  (Elbow-Wrist, 49 m/s).  The left median (across palm) sensory nerve showed no response (Palm) and prolonged distal peak latency (6.0 ms).  The right median (across palm) sensory nerve showed prolonged distal peak latency (Wrist, 4.2 ms) and prolonged distal peak latency (Palm, 2.1 ms).  All remaining nerves (as indicated in the following tables) were within normal limits.  Left vs. Right side comparison data for the median motor nerve indicates abnormal L-R latency difference (1.6 ms).  The ulnar motor nerve indicates abnormal L-R velocity difference (A Elbow-B Elbow, 20 m/s).  The ulnar sensory nerve indicates abnormal L-R latency difference (0.5 ms).    All examined muscles (as indicated in the following table) showed no evidence of electrical instability.    Impression: The above electrodiagnostic study is ABNORMAL and reveals evidence of:  a moderate left median nerve entrapment at the wrist (carpal tunnel syndrome) affecting sensory and motor components.   a mild right median nerve entrapment at the wrist (carpal tunnel syndrome) affecting sensory components.   There is no significant electrodiagnostic evidence of any other focal nerve entrapment, brachial plexopathy or cervical radiculopathy.   Recommendations: 1.  Follow-up with referring physician. 2.  Continue current management of symptoms. 3.  Continue use of resting splint at night-time and as needed during the day. 4.  Suggest surgical evaluation.  ___________________________ Prentice Masters FAAPMR Board Certified, American Board of Physical Medicine and Rehabilitation    Nerve Conduction Studies Anti Sensory Summary Table   Stim Site NR Peak (ms) Norm Peak (ms) P-T Amp (V) Norm P-T Amp Site1 Site2 Delta-P (ms) Dist (cm) Vel (m/s) Norm Vel (m/s)  Left Median Acr Palm Anti Sensory (2nd Digit)  32C  Wrist    *6.0 <3.6 11.1 >10 Wrist Palm  0.0    Palm *NR  <2.0          Right Median Acr Palm Anti Sensory (2nd Digit)  31.6C   Wrist    *4.2 <3.6 27.8 >10 Wrist Palm 2.1 0.0    Palm    *2.1 <2.0 3.9         Left Radial Anti Sensory (Base 1st Digit)  31.7C  Wrist    2.1 <3.1 39.9  Wrist Base 1st Digit 2.1 0.0    Right Radial Anti Sensory (Base 1st Digit)  31.3C  Wrist    2.1 <3.1 20.1  Wrist Base 1st Digit 2.1 0.0    Left Ulnar Anti Sensory (5th Digit)  32.2C  Wrist    3.5 <3.7 22.8 >15.0 Wrist 5th Digit 3.5 14.0 40 >38  Right Ulnar Anti Sensory (5th Digit)  32C  Wrist    3.0 <3.7 15.9 >15.0 Wrist 5th Digit 3.0 14.0 47 >38   Motor Summary Table   Stim Site NR Onset (ms) Norm Onset (ms) O-P Amp (mV) Norm O-P Amp Site1 Site2 Delta-0 (ms) Dist (cm) Vel (m/s) Norm Vel (m/s)  Left Median Motor (Abd Poll Brev)  32.1C  Wrist    *5.4 <4.2 6.8 >5 Elbow Wrist 4.5 22.0 *49 >50  Elbow  9.9  6.1         Right Median Motor (Abd Poll Brev)  31.8C  Wrist    3.8 <4.2 8.9 >5 Elbow Wrist 3.9 22.0 56 >50  Elbow    7.7  9.1         Left Ulnar Motor (Abd Dig Min)  32.2C  Wrist    2.7 <4.2 9.6 >3 B Elbow Wrist 3.2 19.0 59 >53  B Elbow    5.9  8.9  A Elbow B Elbow 1.6 10.0 63 >53  A Elbow    7.5  8.2         Right Ulnar Motor (Abd Dig Min)  31.7C  Wrist    2.6 <4.2 9.7 >3 B Elbow Wrist 3.6 21.0 58 >53  B Elbow    6.2  9.7  A Elbow B Elbow 1.2 10.0 83 >53  A Elbow    7.4  9.3          EMG   Side Muscle Nerve Root Ins Act Fibs Psw Amp Dur Poly Recrt Int Bruna Comment  Left Abd Poll Brev Median C8-T1 Nml Nml Nml Nml Nml 0 Nml Nml   Left 1stDorInt Ulnar C8-T1 Nml Nml Nml Nml Nml 0 Nml Nml   Left PronatorTeres Median C6-7 Nml Nml Nml Nml Nml 0 Nml Nml   Left Biceps Musculocut C5-6 Nml Nml Nml Nml Nml 0 Nml Nml   Left Deltoid Axillary C5-6 Nml Nml Nml Nml Nml 0 Nml Nml   Right Abd Poll Brev Median C8-T1 Nml Nml Nml Nml Nml 0 Nml Nml   Right 1stDorInt Ulnar C8-T1 Nml Nml Nml Nml Nml 0 Nml Nml   Right PronatorTeres Median C6-7 Nml Nml Nml Nml Nml 0 Nml Nml   Right Biceps Musculocut C5-6 Nml Nml Nml Nml Nml 0 Nml Nml    Right Deltoid Axillary C5-6 Nml Nml Nml Nml Nml 0 Nml Nml     Nerve Conduction Studies Anti Sensory Left/Right Comparison   Stim Site L Lat (ms) R Lat (ms) L-R Lat (ms) L Amp (V) R Amp (V) L-R Amp (%) Site1 Site2 L Vel (m/s) R Vel (m/s) L-R Vel (m/s)  Median Acr Palm Anti Sensory (2nd Digit)  32C  Wrist *6.0 *4.2 1.8 11.1 27.8 60.1 Wrist Palm     Palm  *2.1   3.9        Radial Anti Sensory (Base 1st Digit)  31.7C  Wrist 2.1 2.1 0.0 39.9 20.1 49.6 Wrist Base 1st Digit     Ulnar Anti Sensory (5th Digit)  32.2C  Wrist 3.5 3.0 *0.5 22.8 15.9 30.3 Wrist 5th Digit 40 47 7   Motor Left/Right Comparison   Stim Site L Lat (ms) R Lat (ms) L-R Lat (ms) L Amp (mV) R Amp (mV) L-R Amp (%) Site1 Site2 L Vel (m/s) R Vel (m/s) L-R Vel (m/s)  Median Motor (Abd Poll Brev)  32.1C  Wrist *5.4 3.8 *1.6 6.8 8.9 23.6 Elbow Wrist *49 56 7  Elbow 9.9 7.7 2.2 6.1 9.1 33.0       Ulnar Motor (Abd Dig Min)  32.2C  Wrist 2.7 2.6 0.1 9.6 9.7 1.0 B Elbow Wrist 59 58 1  B Elbow 5.9 6.2 0.3 8.9 9.7 8.2 A Elbow B Elbow 63 83 *20  A Elbow 7.5 7.4 0.1 8.2 9.3 11.8          Waveforms:  Clinical History: 08/05/2016 NCV & EMG Findings: Extensive electrodiagnostic testing of the right upper extremity shows:  1. Right median and ulnar sensory responses are within normal limits. Right mixed palmer sensory responses show prolonged latency. 2. Right median and ulnar motor responses are within normal limits. 3. Sparse chronic motor axon loss changes are seen affecting the right biceps and deltoid muscles, without accompanied active denervation.   Impression: 1. Right median neuropathy at or distal to the wrist, consistent with the clinical diagnosis of carpal tunnel syndrome. Overall, these findings are very mild in degree electrically 2. Chronic C5 radiculopathy affecting the right upper extremity; mild in degree electrically.     ___________________________ [name], DO   She  reports that she has never smoked. She has never used smokeless tobacco.  Recent Labs    02/19/23 0832 06/25/23 0939 11/18/23 0934  HGBA1C 6.1 6.0 6.0    Objective:  VS:  HT:    WT:   BMI:     BP:   HR: bpm  TEMP: ( )  RESP:  Physical Exam Vitals and nursing note reviewed.  Constitutional:      General: She is not in acute distress.    Appearance: Normal appearance. She is well-developed. She is obese. She is not ill-appearing.  HENT:     Head: Normocephalic and atraumatic.  Eyes:     Conjunctiva/sclera: Conjunctivae normal.     Pupils: Pupils are equal, round, and reactive to light.  Cardiovascular:     Rate and Rhythm: Normal rate.     Pulses: Normal pulses.  Pulmonary:     Effort: Pulmonary effort is normal.  Musculoskeletal:        General: Tenderness present. No swelling or deformity.     Right lower leg: No edema.     Left lower leg: No edema.     Comments: Inspection reveals no atrophy of the bilateral APB or FDI or hand intrinsics. There is no swelling, color changes, allodynia or dystrophic changes. There is 5 out of 5 strength in the bilateral wrist extension, finger abduction and long finger flexion. There is intact sensation to light touch in all dermatomal and peripheral nerve distributions. There is a negative Tinel's test at the bilateral wrist and elbow. There is a positive Phalen's test bilaterally. There is a negative Hoffmann's test bilaterally.  Skin:    General: Skin is warm and dry.     Findings: No erythema or rash.  Neurological:     General: No focal deficit present.     Mental Status: She is alert and oriented to person, place, and time.     Cranial Nerves: No cranial nerve deficit.     Sensory: No sensory deficit.     Motor: No weakness or abnormal muscle tone.     Coordination: Coordination normal.     Gait: Gait normal.  Psychiatric:        Mood and Affect: Mood normal.        Behavior: Behavior normal.     Ortho Exam  Imaging: No  results found.  Past Medical/Family/Surgical/Social History: Medications & Allergies reviewed per EMR, new medications updated. Patient Active Problem List   Diagnosis Date Noted   Bilateral carpal tunnel syndrome 11/18/2023   Atrophic gastritis 08/18/2023   Pulmonary hypertension (HCC) 07/15/2023   Encounter for general adult medical examination with abnormal findings 11/19/2022   Bariatric surgery status 11/19/2022   Chronic kidney disease, stage 2, mildly decreased GFR 05/26/2022   Prolonged Q-T  interval on ECG 05/19/2022   Iron  deficiency 05/19/2022   Folate deficiency 05/19/2022   Urge incontinence of urine 11/08/2021   Intrinsic eczema 10/28/2021   Insulin  resistance syndrome 07/29/2021   Estrogen deficiency 05/21/2021   Chronic idiopathic constipation 08/28/2020   Moderate episode of recurrent major depressive disorder (HCC) 08/28/2020   GERD with esophagitis 08/15/2019   Prediabetes 08/15/2019   Thiamine  deficiency 02/14/2019   Zinc  deficiency 02/12/2019   LAP-BAND surgery status 01/20/2018   SUI (stress urinary incontinence, female) 10/13/2017   Abnormal electrocardiogram (ECG) (EKG) 10/13/2017   Multiple pulmonary nodules determined by computed tomography of lung 10/13/2017   B12 deficiency anemia 10/23/2015   Insomnia with sleep apnea 10/23/2015   Asthma, mild intermittent 06/27/2015   Primary osteoarthritis of both knees 04/26/2015   OAB (overactive bladder) 01/18/2014   Hyperlipidemia with target LDL less than 130 05/04/2012   Diuretic-induced hypokalemia 08/16/2011   Obesity, Class III, BMI 40-49.9 (morbid obesity) (HCC) 05/13/2011   OSA (obstructive sleep apnea) 01/22/2011   Allergic rhinitis 09/30/2008   Vitamin D  deficiency 06/15/2008   Essential hypertension 06/01/2008   Past Medical History:  Diagnosis Date   Anemia    Anxiety disorder    Asthma    Chronic cough    Chronic kidney disease    Complication of anesthesia    Had ERCP in 2006 and led to  respiratory failure, caused pancreatits, was in coma for 30days.   Depression    GERD (gastroesophageal reflux disease)    History of ERCP    History of pancreatitis    History of tracheostomy    HTN (hypertension)    Hyperglycemia    Hypokalemia    Pneumonia    Pre-diabetes    Respiratory failure (HCC)    Sleep apnea    CPAP   Family History  Problem Relation Age of Onset   Asthma Mother    Hypertension Mother    Diabetes Other    Hypertension Other    Past Surgical History:  Procedure Laterality Date   ABDOMINAL HYSTERECTOMY     Total Hysterectomy    APPENDECTOMY     BOTOX  INJECTION N/A 01/22/2023   Procedure: BOTOX  INJECTION 100 UNITS;  Surgeon: Elisabeth Valli BIRCH, MD;  Location: [facility];  Service: Urology;  Laterality: N/A;   BREAST REDUCTION SURGERY     CHOLECYSTECTOMY     COLONOSCOPY N/A 06/26/2023   Procedure: COLONOSCOPY;  Surgeon: Rollin Dover, MD;  Location: [facility];  Service: Gastroenterology;  Laterality: N/A;   COLONOSCOPY WITH PROPOFOL  N/A 12/21/2015   Procedure: COLONOSCOPY WITH PROPOFOL ;  Surgeon: Dover Rollin, MD;  Location: [facility];  Service: Endoscopy;  Laterality: N/A;   CYSTOSCOPY WITH BIOPSY N/A 01/22/2023   Procedure: CYSTOSCOPY;  Surgeon: Elisabeth Valli BIRCH, MD;  Location: [facility];  Service: Urology;  Laterality: N/A;  60 MINUTE CASE   ERCP     ESOPHAGOGASTRODUODENOSCOPY N/A 12/21/2015   Procedure: ESOPHAGOGASTRODUODENOSCOPY (EGD);  Surgeon: Dover Rollin, MD;  Location: [facility];  Service: Endoscopy;  Laterality: N/A;   ESOPHAGOGASTRODUODENOSCOPY N/A 06/26/2023   Procedure: EGD (ESOPHAGOGASTRODUODENOSCOPY);  Surgeon: Rollin Dover, MD;  Location: [facility];  Service: Gastroenterology;  Laterality: N/A;   GIVENS CAPSULE STUDY N/A 01/29/2016   Procedure: GIVENS CAPSULE STUDY;  Surgeon: Renaye Sous, MD;  Location[facility];  Service: Endoscopy;  Laterality: N/A;   HAMMER TOE SURGERY     INTESTINAL  MALROTATION REPAIR  1999   TUBAL LIGATION     Social  History   Occupational History   Occupation: Chief Operating Officer: [employer]  Tobacco Use   Smoking status: Never   Smokeless tobacco: Never  Vaping Use   Vaping status: Never Used  Substance and Sexual Activity   Alcohol use: No   Drug use: Never   Sexual activity: Not Currently    Birth control/protection: Surgical

## 2023-12-17 NOTE — Procedures (Unsigned)
 EMG & NCV Findings: Evaluation of the left median motor nerve showed prolonged distal onset latency (5.4 ms) and decreased conduction velocity (Elbow-Wrist, 49 m/s).  The left median (across palm) sensory nerve showed no response (Palm) and prolonged distal peak latency (6.0 ms).  The right median (across palm) sensory nerve showed prolonged distal peak latency (Wrist, 4.2 ms) and prolonged distal peak latency (Palm, 2.1 ms).  All remaining nerves (as indicated in the following tables) were within normal limits.  Left vs. Right side comparison data for the median motor nerve indicates abnormal L-R latency difference (1.6 ms).  The ulnar motor nerve indicates abnormal L-R velocity difference (A Elbow-B Elbow, 20 m/s).  The ulnar sensory nerve indicates abnormal L-R latency difference (0.5 ms).    All examined muscles (as indicated in the following table) showed no evidence of electrical instability.    Impression: The above electrodiagnostic study is ABNORMAL and reveals evidence of:  a moderate left median nerve entrapment at the wrist (carpal tunnel syndrome) affecting sensory and motor components.   a mild right median nerve entrapment at the wrist (carpal tunnel syndrome) affecting sensory components.   There is no significant electrodiagnostic evidence of any other focal nerve entrapment, brachial plexopathy or cervical radiculopathy.   Recommendations: 1.  Follow-up with referring physician. 2.  Continue current management of symptoms. 3.  Continue use of resting splint at night-time and as needed during the day. 4.  Suggest surgical evaluation.  ___________________________ [name] FAAPMR Board Certified, American Board of Physical Medicine and Rehabilitation    Nerve Conduction Studies Anti Sensory Summary Table   Stim Site NR Peak (ms) Norm Peak (ms) P-T Amp (V) Norm P-T Amp Site1 Site2 Delta-P (ms) Dist (cm) Vel (m/s) Norm Vel (m/s)  Left Median Acr Palm Anti Sensory (2nd  Digit)  32C  Wrist    *6.0 <3.6 11.1 >10 Wrist Palm  0.0    Palm *NR  <2.0          Right Median Acr Palm Anti Sensory (2nd Digit)  31.6C  Wrist    *4.2 <3.6 27.8 >10 Wrist Palm 2.1 0.0    Palm    *2.1 <2.0 3.9         Left Radial Anti Sensory (Base 1st Digit)  31.7C  Wrist    2.1 <3.1 39.9  Wrist Base 1st Digit 2.1 0.0    Right Radial Anti Sensory (Base 1st Digit)  31.3C  Wrist    2.1 <3.1 20.1  Wrist Base 1st Digit 2.1 0.0    Left Ulnar Anti Sensory (5th Digit)  32.2C  Wrist    3.5 <3.7 22.8 >15.0 Wrist 5th Digit 3.5 14.0 40 >38  Right Ulnar Anti Sensory (5th Digit)  32C  Wrist    3.0 <3.7 15.9 >15.0 Wrist 5th Digit 3.0 14.0 47 >38   Motor Summary Table   Stim Site NR Onset (ms) Norm Onset (ms) O-P Amp (mV) Norm O-P Amp Site1 Site2 Delta-0 (ms) Dist (cm) Vel (m/s) Norm Vel (m/s)  Left Median Motor (Abd Poll Brev)  32.1C  Wrist    *5.4 <4.2 6.8 >5 Elbow Wrist 4.5 22.0 *49 >50  Elbow    9.9  6.1         Right Median Motor (Abd Poll Brev)  31.8C  Wrist    3.8 <4.2 8.9 >5 Elbow Wrist 3.9 22.0 56 >50  Elbow    7.7  9.1         Left Ulnar Motor (  Abd Dig Min)  32.2C  Wrist    2.7 <4.2 9.6 >3 B Elbow Wrist 3.2 19.0 59 >53  B Elbow    5.9  8.9  A Elbow B Elbow 1.6 10.0 63 >53  A Elbow    7.5  8.2         Right Ulnar Motor (Abd Dig Min)  31.7C  Wrist    2.6 <4.2 9.7 >3 B Elbow Wrist 3.6 21.0 58 >53  B Elbow    6.2  9.7  A Elbow B Elbow 1.2 10.0 83 >53  A Elbow    7.4  9.3          EMG   Side Muscle Nerve Root Ins Act Fibs Psw Amp Dur Poly Recrt Int Bruna Comment  Left Abd Poll Brev Median C8-T1 Nml Nml Nml Nml Nml 0 Nml Nml   Left 1stDorInt Ulnar C8-T1 Nml Nml Nml Nml Nml 0 Nml Nml   Left PronatorTeres Median C6-7 Nml Nml Nml Nml Nml 0 Nml Nml   Left Biceps Musculocut C5-6 Nml Nml Nml Nml Nml 0 Nml Nml   Left Deltoid Axillary C5-6 Nml Nml Nml Nml Nml 0 Nml Nml   Right Abd Poll Brev Median C8-T1 Nml Nml Nml Nml Nml 0 Nml Nml   Right 1stDorInt Ulnar C8-T1 Nml Nml Nml Nml Nml 0  Nml Nml   Right PronatorTeres Median C6-7 Nml Nml Nml Nml Nml 0 Nml Nml   Right Biceps Musculocut C5-6 Nml Nml Nml Nml Nml 0 Nml Nml   Right Deltoid Axillary C5-6 Nml Nml Nml Nml Nml 0 Nml Nml     Nerve Conduction Studies Anti Sensory Left/Right Comparison   Stim Site L Lat (ms) R Lat (ms) L-R Lat (ms) L Amp (V) R Amp (V) L-R Amp (%) Site1 Site2 L Vel (m/s) R Vel (m/s) L-R Vel (m/s)  Median Acr Palm Anti Sensory (2nd Digit)  32C  Wrist *6.0 *4.2 1.8 11.1 27.8 60.1 Wrist Palm     Palm  *2.1   3.9        Radial Anti Sensory (Base 1st Digit)  31.7C  Wrist 2.1 2.1 0.0 39.9 20.1 49.6 Wrist Base 1st Digit     Ulnar Anti Sensory (5th Digit)  32.2C  Wrist 3.5 3.0 *0.5 22.8 15.9 30.3 Wrist 5th Digit 40 47 7   Motor Left/Right Comparison   Stim Site L Lat (ms) R Lat (ms) L-R Lat (ms) L Amp (mV) R Amp (mV) L-R Amp (%) Site1 Site2 L Vel (m/s) R Vel (m/s) L-R Vel (m/s)  Median Motor (Abd Poll Brev)  32.1C  Wrist *5.4 3.8 *1.6 6.8 8.9 23.6 Elbow Wrist *49 56 7  Elbow 9.9 7.7 2.2 6.1 9.1 33.0       Ulnar Motor (Abd Dig Min)  32.2C  Wrist 2.7 2.6 0.1 9.6 9.7 1.0 B Elbow Wrist 59 58 1  B Elbow 5.9 6.2 0.3 8.9 9.7 8.2 A Elbow B Elbow 63 83 *20  A Elbow 7.5 7.4 0.1 8.2 9.3 11.8          Waveforms:

## 2024-01-02 ENCOUNTER — Other Ambulatory Visit: Payer: Self-pay | Admitting: Internal Medicine

## 2024-01-02 DIAGNOSIS — K5904 Chronic idiopathic constipation: Secondary | ICD-10-CM

## 2024-01-05 NOTE — Progress Notes (Unsigned)
 "  [name] - 68 y.o. female MRN [id number]  Date of birth: November 15, 1955  Office Visit Note: Visit Date: 01/06/2024 PCP: Joshua Debby CROME, MD Referred by: Eldonna Novel, MD  Subjective: No chief complaint on file.  HPI: [name] is a pleasant 68 y.o. female who presents today for evaluation of bilateral hand numbness and tingling that has been present now for multiple years, worsening in nature.  She has associated nocturnal symptoms as well.  Has been trialing activity modification and over-the-counter medication without lasting relief.  She has undergone electrodiagnostic study which does confirm bilateral carpal tunnel syndrome.  She is here today to discuss surgical options.  Pertinent ROS were reviewed with the patient and found to be negative unless otherwise specified above in HPI.   Visit Reason: bilateral hand Duration of symptoms: years Hand dominance: left Occupation: retired Diabetic: Yes/6.0 Smoking: No Heart/Lung History: none Blood Thinners: none  Prior Testing/EMG: EMG 12/16/23 Injections (Date): none Treatments: none Prior Surgery: none    Assessment & Plan: Visit Diagnoses:  1. Bilateral carpal tunnel syndrome     Plan: Extensive discussion was had with the patient today about her ongoing bilateral carpal tunnel syndrome that is refractory to conservative care.  Patient has both clinical and electrodiagnostic evidence to confirm this diagnosis.  Her left side symptoms are more severe which correlates with her electrodiagnostic study.  At this juncture, she is indicated for left open versus endoscopic carpal tunnel release.  Risks and benefits of both operations were discussed in detail today.  Understanding all risks and benefits, patient would like to have surgery done in the form of left open carpal tunnel release under local anesthesia in the office.  Risks include but not limited to infection, bleeding, scarring, stiffness, nerve injury or vascular,  tendon injury, risk of recurrence and need for subsequent operation were all discussed in detail.  Patient consented understanding the above.  Will move forward surgical scheduling.  As for the right side, we will begin with wrist bracing primarily for nocturnal symptoms, she can utilize this during the day as well and we will see how this evolves with time.   Follow-up: No follow-ups on file.   Meds & Orders: No orders of the defined types were placed in this encounter.  No orders of the defined types were placed in this encounter.    Procedures: No procedures performed      Clinical History: 08/05/2016 NCV & EMG Findings: Extensive electrodiagnostic testing of the right upper extremity shows:  1. Right median and ulnar sensory responses are within normal limits. Right mixed palmer sensory responses show prolonged latency. 2. Right median and ulnar motor responses are within normal limits. 3. Sparse chronic motor axon loss changes are seen affecting the right biceps and deltoid muscles, without accompanied active denervation.   Impression: 1. Right median neuropathy at or distal to the wrist, consistent with the clinical diagnosis of carpal tunnel syndrome. Overall, these findings are very mild in degree electrically 2. Chronic C5 radiculopathy affecting the right upper extremity; mild in degree electrically.     ___________________________ Sandy Blanch, DO  She reports that she has never smoked. She has never used smokeless tobacco.  Recent Labs    02/19/23 0832 06/25/23 0939 11/18/23 0934  HGBA1C 6.1 6.0 6.0    Objective:   Vital Signs: There were no vitals taken for this visit.  Physical Exam  Gen: Well-appearing, in no acute distress; non-toxic CV: Regular Rate. Well-perfused. Warm.  Resp: Breathing  unlabored on room air; no wheezing. Psych: Fluid speech in conversation; appropriate affect; normal thought process  Ortho Exam PHYSICAL EXAM:  General: Patient is  well appearing and in no distress.   Skin and Muscle: No significant skin changes are apparent to upper extremities.   Range of Motion and Palpation Tests: Mobility is full about the elbows with flexion and extension.Forearm supination and pronation are 85/85 bilaterally.  Wrist flexion/extension is 75/65 bilaterally.  Digital flexion and extension are full.  Thumb opposition is full to the base of the small fingers bilaterally.    No cords or nodules are palpated.  No triggering is observed.     Neurologic, Vascular, Motor: Sensation is diminished to light touch in the bilateral median nerve distribution.    Thenar atrophy: Negative bilateral Tinel sign: Positive bilateral carpal tunnel Carpal tunnel compression: Positive left Phalen test: Positive left   Motor bilateral hand FPL: 5/5 Index FDP: 5/5 APB: 5/5 without thenar atrophy  Fingers pink and well perfused.  Capillary refill is brisk.     Lab Results  Component Value Date   HGBA1C 6.0 11/18/2023      Imaging: No results found.  Past Medical/Family/Surgical/Social History: Medications & Allergies reviewed per EMR, new medications updated. Patient Active Problem List   Diagnosis Date Noted   Bilateral carpal tunnel syndrome 11/18/2023   Atrophic gastritis 08/18/2023   Pulmonary hypertension (HCC) 07/15/2023   Encounter for general adult medical examination with abnormal findings 11/19/2022   Bariatric surgery status 11/19/2022   Chronic kidney disease, stage 2, mildly decreased GFR 05/26/2022   Prolonged Q-T interval on ECG 05/19/2022   Iron  deficiency 05/19/2022   Folate deficiency 05/19/2022   Urge incontinence of urine 11/08/2021   Intrinsic eczema 10/28/2021   Insulin  resistance syndrome 07/29/2021   Estrogen deficiency 05/21/2021   Chronic idiopathic constipation 08/28/2020   Moderate episode of recurrent major depressive disorder (HCC) 08/28/2020   GERD with esophagitis 08/15/2019   Prediabetes  08/15/2019   Thiamine  deficiency 02/14/2019   Zinc  deficiency 02/12/2019   LAP-BAND surgery status 01/20/2018   SUI (stress urinary incontinence, female) 10/13/2017   Abnormal electrocardiogram (ECG) (EKG) 10/13/2017   Multiple pulmonary nodules determined by computed tomography of lung 10/13/2017   B12 deficiency anemia 10/23/2015   Insomnia with sleep apnea 10/23/2015   Asthma, mild intermittent 06/27/2015   Primary osteoarthritis of both knees 04/26/2015   OAB (overactive bladder) 01/18/2014   Hyperlipidemia with target LDL less than 130 05/04/2012   Diuretic-induced hypokalemia 08/16/2011   Obesity, Class III, BMI 40-49.9 (morbid obesity) (HCC) 05/13/2011   OSA (obstructive sleep apnea) 01/22/2011   Allergic rhinitis 09/30/2008   Vitamin D  deficiency 06/15/2008   Essential hypertension 06/01/2008   Past Medical History:  Diagnosis Date   Anemia    Anxiety disorder    Asthma    Chronic cough    Chronic kidney disease    Complication of anesthesia    Had ERCP in 2006 and led to respiratory failure, caused pancreatits, was in coma for 30days.   Depression    GERD (gastroesophageal reflux disease)    History of ERCP    History of pancreatitis    History of tracheostomy    HTN (hypertension)    Hyperglycemia    Hypokalemia    Pneumonia    Pre-diabetes    Respiratory failure (HCC)    Sleep apnea    CPAP   Family History  Problem Relation Age of Onset   Asthma Mother  Hypertension Mother    Diabetes Other    Hypertension Other    Past Surgical History:  Procedure Laterality Date   ABDOMINAL HYSTERECTOMY     Total Hysterectomy    APPENDECTOMY     BOTOX  INJECTION N/A 01/22/2023   Procedure: BOTOX  INJECTION 100 UNITS;  Surgeon: Elisabeth Valli BIRCH, MD;  Location: [facility];  Service: Urology;  Laterality: N/A;   BREAST REDUCTION SURGERY     CHOLECYSTECTOMY     COLONOSCOPY N/A 06/26/2023   Procedure: COLONOSCOPY;  Surgeon: Rollin Dover, MD;   Location: [facility];  Service: Gastroenterology;  Laterality: N/A;   COLONOSCOPY WITH PROPOFOL  N/A 12/21/2015   Procedure: COLONOSCOPY WITH PROPOFOL ;  Surgeon: Dover Rollin, MD;  Location: [facility];  Service: Endoscopy;  Laterality: N/A;   CYSTOSCOPY WITH BIOPSY N/A 01/22/2023   Procedure: CYSTOSCOPY;  Surgeon: Elisabeth Valli BIRCH, MD;  Location: [facility];  Service: Urology;  Laterality: N/A;  60 MINUTE CASE   ERCP     ESOPHAGOGASTRODUODENOSCOPY N/A 12/21/2015   Procedure: ESOPHAGOGASTRODUODENOSCOPY (EGD);  Surgeon: Dover Rollin, MD;  Location: [facility];  Service: Endoscopy;  Laterality: N/A;   ESOPHAGOGASTRODUODENOSCOPY N/A 06/26/2023   Procedure: EGD (ESOPHAGOGASTRODUODENOSCOPY);  Surgeon: Rollin Dover, MD;  Location: [facility];  Service: Gastroenterology;  Laterality: N/A;   GIVENS CAPSULE STUDY N/A 01/29/2016   Procedure: GIVENS CAPSULE STUDY;  Surgeon: Renaye Sous, MD;  Location: [facility];  Service: Endoscopy;  Laterality: N/A;   HAMMER TOE SURGERY     INTESTINAL MALROTATION REPAIR  1999   TUBAL LIGATION     Social History   Occupational History   Occupation: Chief Operating Officer: [employer]  Tobacco Use   Smoking status: Never   Smokeless tobacco: Never  Vaping Use   Vaping status: Never Used  Substance and Sexual Activity   Alcohol use: No   Drug use: Never   Sexual activity: Not Currently    Birth control/protection: Surgical    Brodie Correll Estela) Arlinda, M.D. [facility], Hand Surgery  "

## 2024-01-06 ENCOUNTER — Ambulatory Visit (INDEPENDENT_AMBULATORY_CARE_PROVIDER_SITE_OTHER): Admitting: Orthopedic Surgery

## 2024-01-06 DIAGNOSIS — G5603 Carpal tunnel syndrome, bilateral upper limbs: Secondary | ICD-10-CM

## 2024-02-04 ENCOUNTER — Other Ambulatory Visit: Payer: Self-pay

## 2024-02-04 DIAGNOSIS — G5603 Carpal tunnel syndrome, bilateral upper limbs: Secondary | ICD-10-CM

## 2024-02-09 ENCOUNTER — Other Ambulatory Visit: Payer: Self-pay | Admitting: Orthopedic Surgery

## 2024-02-09 ENCOUNTER — Ambulatory Visit: Admitting: Orthopedic Surgery

## 2024-02-09 DIAGNOSIS — G5602 Carpal tunnel syndrome, left upper limb: Secondary | ICD-10-CM

## 2024-02-09 MED ORDER — ACETAMINOPHEN-CODEINE 300-30 MG PO TABS: 1.0000 | ORAL_TABLET | Freq: Four times a day (QID) | ORAL | 0 refills | Status: AC | PRN

## 2024-02-09 NOTE — Progress Notes (Signed)
 "  Procedure Note  Patient: [name]             Date of Birth: 15-Jan-1955           MRN: [id number]             Visit Date: 02/09/2024  Procedures: Visit Diagnoses:  1. Carpal tunnel syndrome, left upper limb     NAME: [name] MEDICAL RECORD NO: [id number] DATE OF BIRTH: 05/27/1955 [facility]AC[facility] LOCATION: [facility] PHYSICIAN: GILDARDO ALDERTON, MD   OPERATIVE REPORT   DATE OF PROCEDURE: 02/09/24    PREOPERATIVE DIAGNOSIS: Left carpal tunnel syndrome   POSTOPERATIVE DIAGNOSIS: Left carpal tunnel syndrome   PROCEDURE: Left open carpal tunnel release   SURGEON:  Gildardo Alderton, M.D.   ASSISTANT: Joesph Dinsmore, OPA   ANESTHESIA: Local   INTRAVENOUS FLUIDS:  Per anesthesia flow sheet.   ESTIMATED BLOOD LOSS:  Minimal.   COMPLICATIONS:  None.   SPECIMENS:  none   TOURNIQUET TIME: 3 minutes   DISPOSITION:  Stable to PACU.   INDICATIONS: 69 year old female with history of left-sided carpal tunnel syndrome refractory to conservative care, patient was indicated for left open carpal tunnel release.  Risks and benefits of surgery were discussed including the risks of infection, bleeding, scarring, stiffness, nerve injury, vascular injury, tendon injury, need for subsequent operation, persistent symptoms.  She voiced understanding of these risks and elected to proceed.  OPERATIVE COURSE: Patient was seen and identified in the preprocedure area and marked appropriately.  Surgical consent had been signed.  Patient was transferred to the procedure room and placed in supine position with the left upper extremity on an arm board.  10 cc of 1% lidocaine  with epinephrine  was utilized around the planned incisional site.  Left upper extremity was prepped and draped in normal sterile orthopedic fashion.  A surgical pause was performed between the surgeon and staff, all were in agreement as to the patient, procedure, and site of procedure.  Tourniquet was placed and  padded appropriately to the left upper arm.  The arm was exsanguinated and the tourniquet was inflated to 250 mmHg.  Longitudinal incision was designed in the thenar crease in line with the radial border of the ring finger, from intersection of Kaplan's cardinal line down to level of the distal wrist crease. Incision was carried down utilizing 15 blade. Blunt dissection was performed, palmar fascia was identified and incised sharply utilizing a Beaver blade. Careful dissection was performed down, thenar musculature was bluntly elevated and the transcarpal ligament was identified.  A Beaver blade was then utilized to divide the transcarpal ligament in a distal to proximal fashion.  Fat surrounding the palmar arch was encountered to confirm appropriate distal release.  At the level of the wrist crease, skin flaps were elevated to allow for release of the proximal portion of transverse carpal ligament as well as the antebrachial fascia into the forearm. Appropriate decompression was noted of the median nerve, care was taken to protect the nerve in its entirety throughout. Once we were satisfied with our proximal and distal release, tourniquet was deflated and bipolar electrocautery was utilized for hemostasis.  Tourniquet time was 3 minutes.  Copious irrigation was performed followed by closure utilizing 4-0 nylon in standard fashion for the skin surface. Sterile dressings were applied followed by a loosefitting soft hand dressing. Patient was subsequently taken to the recovery area in stable condition.  Post-operative plan: The patient will recover and then be discharged home.  The patient will  be non weight bearing on the left upper extremity in a soft dressing.   I will see the patient back in the office in 2 weeks for postoperative followup.  Discharge instructions were provided for appropriate wound care, dressing maintenance and pain control.    Sandy Pfahler, MD Electronically signed, 02/09/24     "

## 2024-02-25 ENCOUNTER — Encounter: Admitting: Orthopedic Surgery

## 2024-02-25 ENCOUNTER — Encounter: Admitting: Rehabilitative and Restorative Service Providers"

## 2024-03-17 ENCOUNTER — Ambulatory Visit: Admitting: Internal Medicine

## 2024-09-09 ENCOUNTER — Ambulatory Visit
